# Patient Record
Sex: Male | Born: 1950 | Race: White | Hispanic: No | Marital: Married | Smoking: Former smoker
Health system: Southern US, Community
[De-identification: ages and names within clinical notes are randomized; demographics above are authoritative.]

## PROBLEM LIST (undated history)

## (undated) DIAGNOSIS — I219 Acute myocardial infarction, unspecified: Secondary | ICD-10-CM

## (undated) DIAGNOSIS — I2510 Atherosclerotic heart disease of native coronary artery without angina pectoris: Secondary | ICD-10-CM

## (undated) DIAGNOSIS — I25110 Atherosclerotic heart disease of native coronary artery with unstable angina pectoris: Secondary | ICD-10-CM

## (undated) DIAGNOSIS — E785 Hyperlipidemia, unspecified: Secondary | ICD-10-CM

## (undated) DIAGNOSIS — M1990 Unspecified osteoarthritis, unspecified site: Secondary | ICD-10-CM

## (undated) DIAGNOSIS — F1010 Alcohol abuse, uncomplicated: Secondary | ICD-10-CM

## (undated) DIAGNOSIS — R0602 Shortness of breath: Secondary | ICD-10-CM

## (undated) DIAGNOSIS — I10 Essential (primary) hypertension: Secondary | ICD-10-CM

## (undated) DIAGNOSIS — Z720 Tobacco use: Secondary | ICD-10-CM

## (undated) DIAGNOSIS — J42 Unspecified chronic bronchitis: Secondary | ICD-10-CM

## (undated) HISTORY — PX: CORONARY ANGIOPLASTY: SHX604

## (undated) HISTORY — DX: Acute myocardial infarction, unspecified: I21.9

## (undated) HISTORY — DX: Atherosclerotic heart disease of native coronary artery with unstable angina pectoris: I25.110

## (undated) HISTORY — PX: EYE SURGERY: SHX253

## (undated) HISTORY — PX: CARDIAC CATHETERIZATION: SHX172

---

## 2004-01-11 ENCOUNTER — Inpatient Hospital Stay (HOSPITAL_COMMUNITY): Admission: EM | Admit: 2004-01-11 | Discharge: 2004-01-14 | Payer: Self-pay

## 2004-01-11 HISTORY — PX: OTHER SURGICAL HISTORY: SHX169

## 2004-01-11 IMAGING — CR DG CHEST 1V PORT
1 series · 1 of 1 positions shown · non-contrast
Comparison: none

CLINICAL DATA: chest pain 
 PORTABLE CHEST SINGLE VIEW [DATE] [D9] hours:
 No prior studies for comparison. 
 Mild cardiomegaly.  No edema, infiltrate or visible pleural fluid.  Visualized bony thorax is unremarkable.  
 IMPRESSION
 Mild cardiomegaly.  No active disease.

[view not recorded]
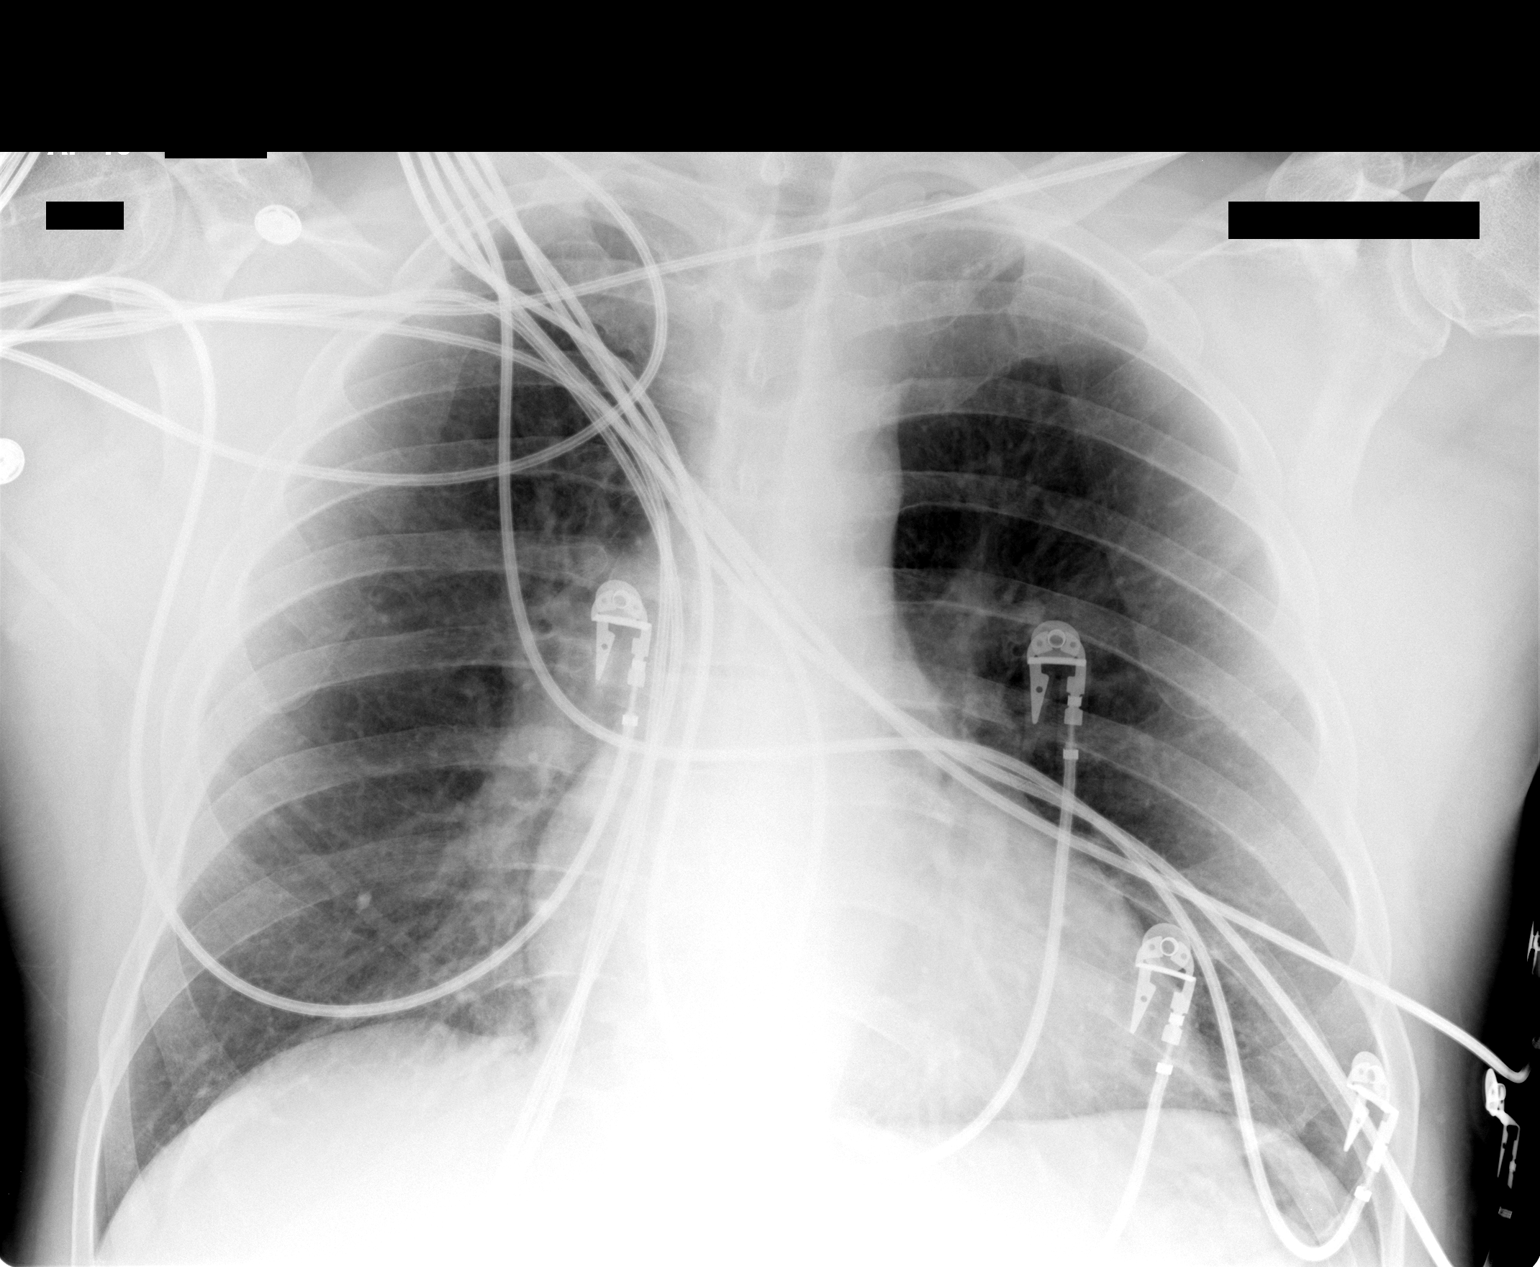

[1 of 1 positions shown; findings below may reference images not displayed]

## 2011-05-02 ENCOUNTER — Other Ambulatory Visit: Payer: Self-pay

## 2011-05-02 ENCOUNTER — Other Ambulatory Visit: Payer: Self-pay | Admitting: Family Medicine

## 2011-05-02 DIAGNOSIS — N5089 Other specified disorders of the male genital organs: Secondary | ICD-10-CM

## 2011-05-02 DIAGNOSIS — R1031 Right lower quadrant pain: Secondary | ICD-10-CM

## 2011-05-03 ENCOUNTER — Ambulatory Visit
Admission: RE | Admit: 2011-05-03 | Discharge: 2011-05-03 | Disposition: A | Discharge status: Home / Self Care | Payer: 59 | Source: Ambulatory Visit | Attending: Family Medicine | Admitting: Family Medicine

## 2011-05-03 ENCOUNTER — Ambulatory Visit: Admission: RE | Admit: 2011-05-03 | Payer: 59 | Source: Ambulatory Visit

## 2011-05-03 DIAGNOSIS — R1031 Right lower quadrant pain: Secondary | ICD-10-CM

## 2011-05-03 DIAGNOSIS — N5089 Other specified disorders of the male genital organs: Secondary | ICD-10-CM

## 2011-05-03 IMAGING — US US SCROTUM
1 series · 13 of 25 positions shown · non-contrast
Comparison: None.

CLINICAL DATA: Lump and scrotum, right lower quadrant pain

ULTRASOUND OF SCROTUM, LIMITED ULTRASOUND OF PELVIS
TECHNIQUE: Complete ultrasound examination of the testicles,
epididymis, and other scrotal structures was performed.

[Series 1: us scrotum · 0.12mm/px · 13 of 52 slices shown]
[im 1/52]
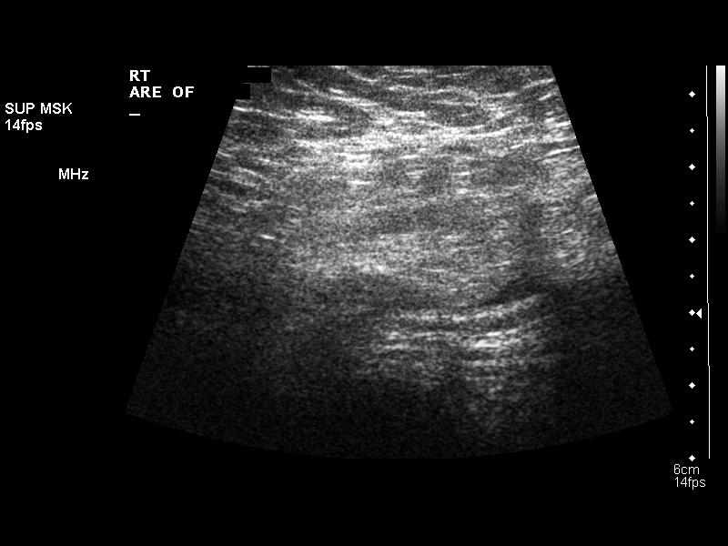
[im 5/52]
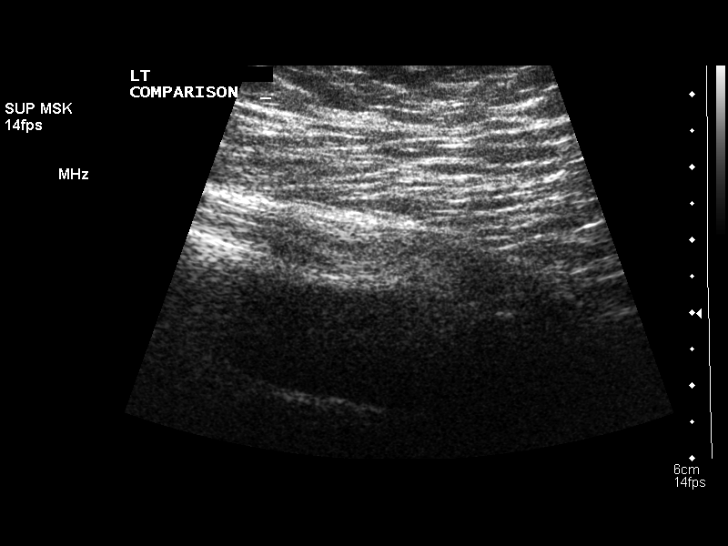
[im 9/52]
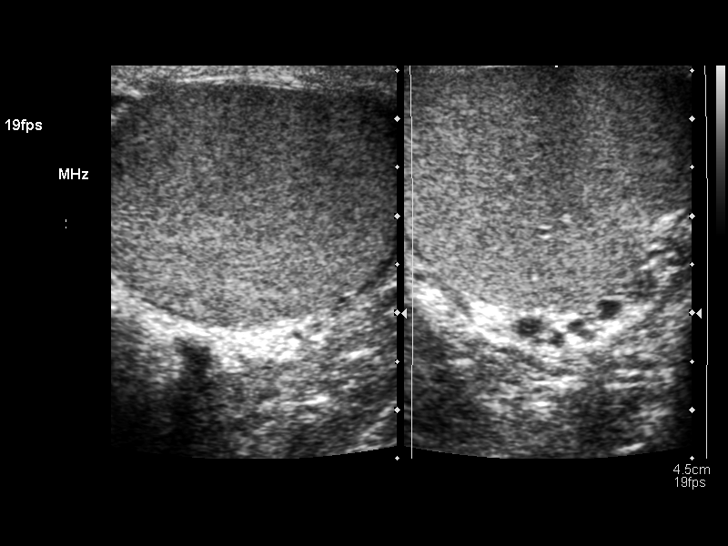
[im 13/52]
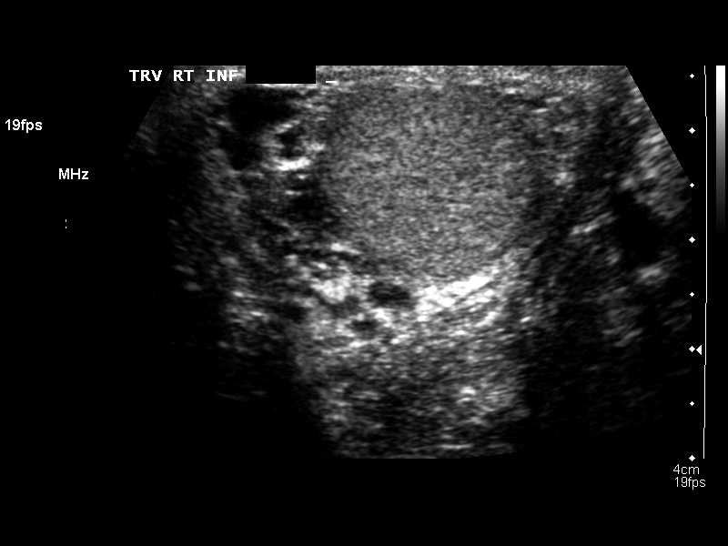
[im 18/52]
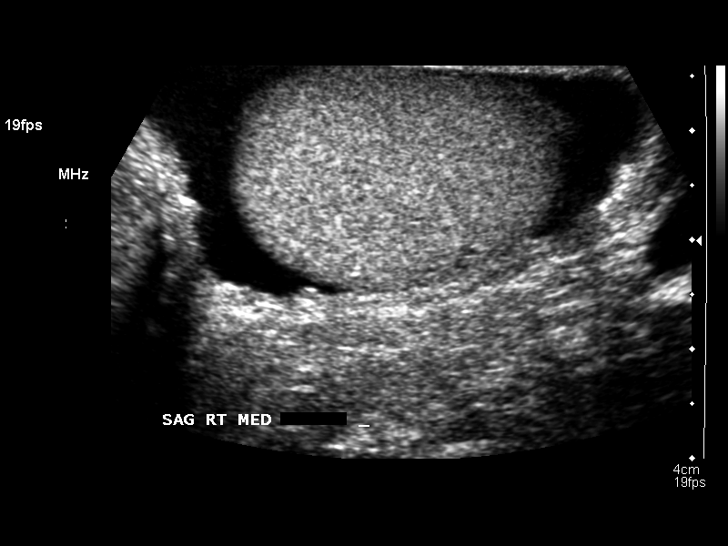
[im 22/52]
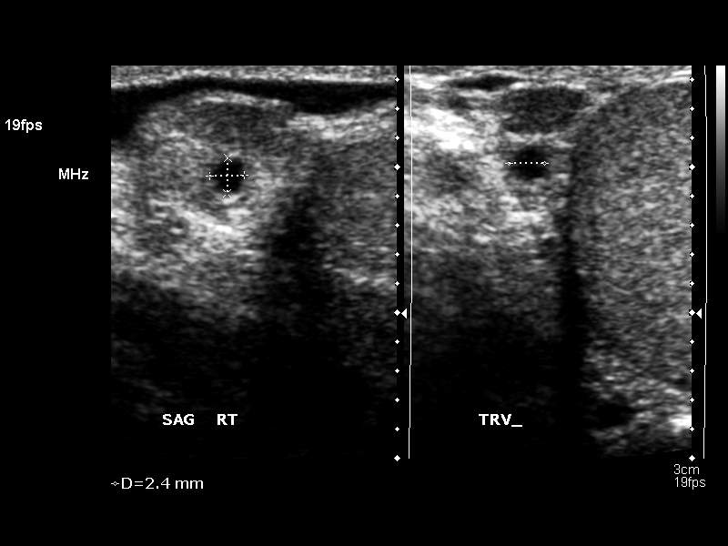
[im 26/52]
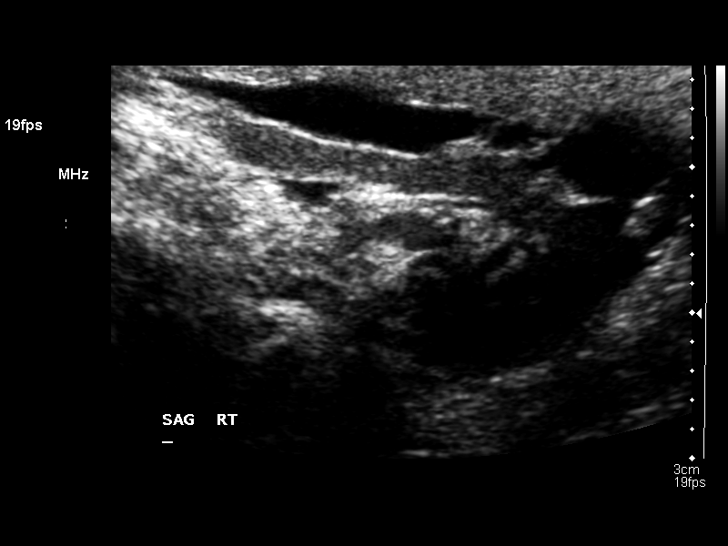
[im 30/52]
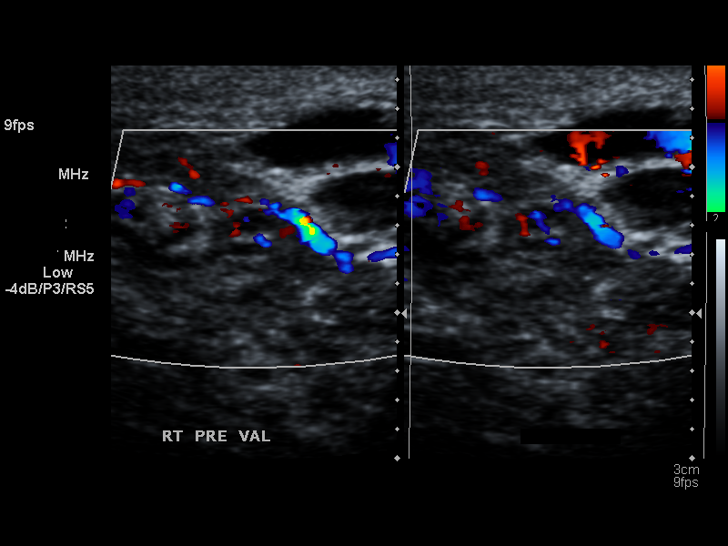
[im 35/52]
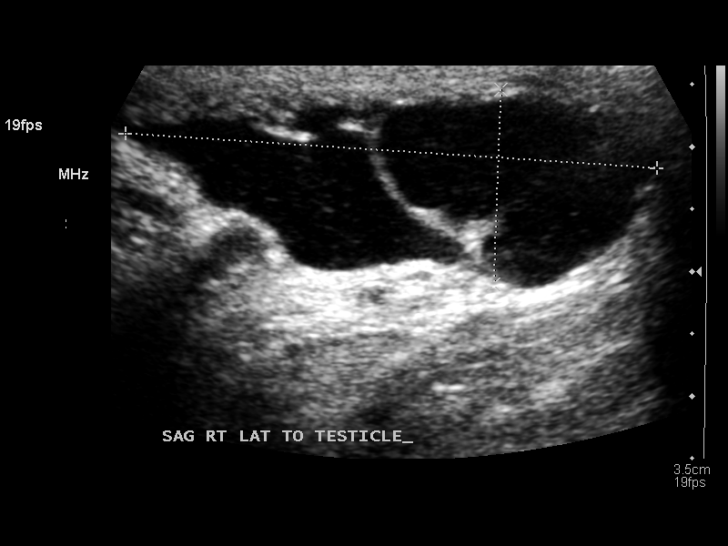
[im 39/52]
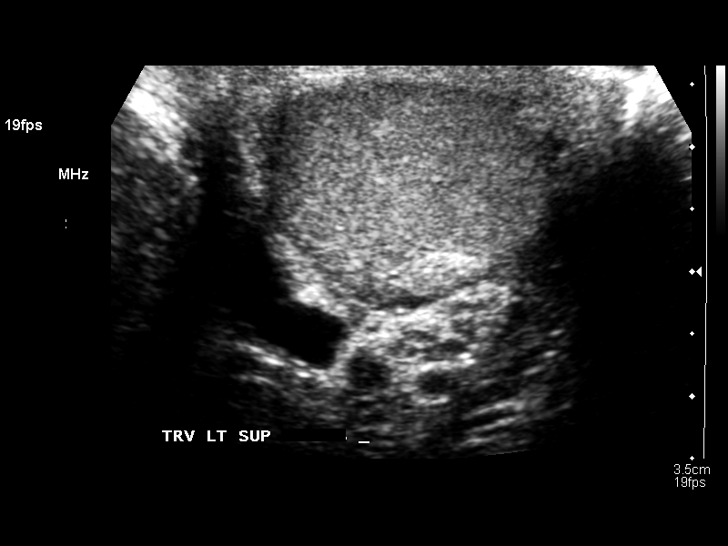
[im 43/52]
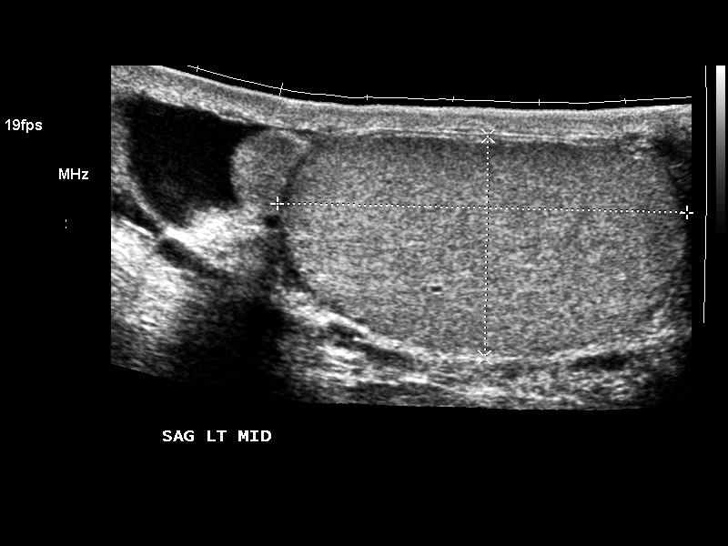
[im 47/52]
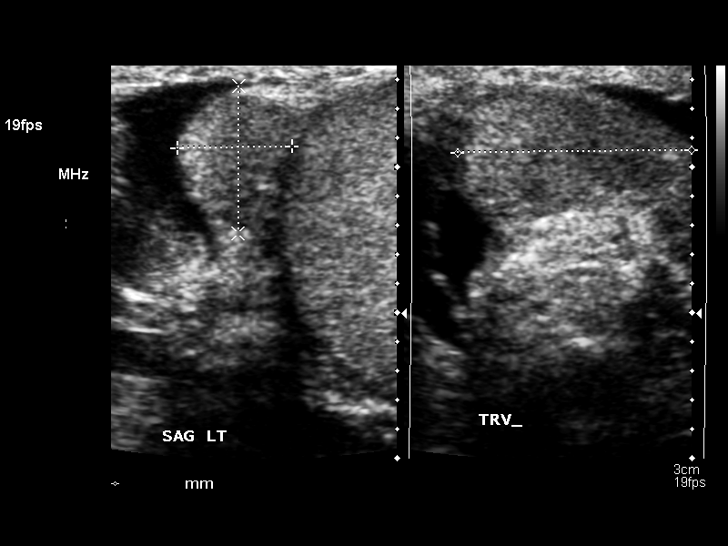
[im 52/52]
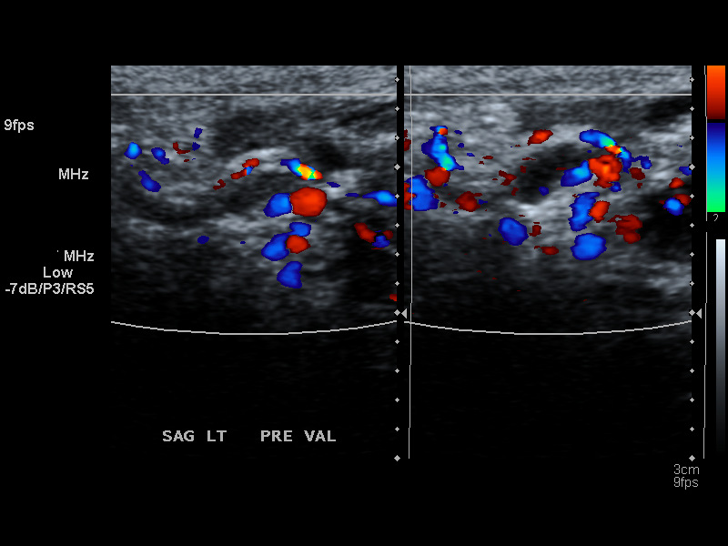

[13 of 25 positions shown; findings below may reference images not displayed]

FINDINGS: Right testis:  The right testicle is normal in size and
echogenicity.  There is blood flow to the right testicle. There is
a loculated complex fluid collection lateral to the right testicle
with internal debris.  This may represent a spermatocele of 4.3 x
1.6 x 1.8 cm.

Left testis:  The left testicle also is normal in size and in
echogenicity.  Blood flow is demonstrated to the left testicle.

Right epididymis:  A small cyst is noted in the head of the right
epididymis measuring 2 x 2 x 2 mm.

Left epididymis:  The left epididymis is unremarkable.

Hydrocele:  No definite hydrocele is seen.

Varicocele:  No varicocele is seen.
IMPRESSION: 1.  No intratesticular abnormality.  Blood flow is noted to both
testicles.
2.  Complex loculated fluid lateral to the right testicle may
represent spermatocele.

 LIMITED ULTRASOUND OF PELVIS
FINDINGS: Ultrasound over the right groin was performed.  There
does appear to be fat which extends somewhat further into the right
inguinal canal with Valsalva maneuver, consistent with a small
right inguinal hernia containing fat.
IMPRESSION: Suspect small right inguinal hernia containing fat.

## 2011-11-11 HISTORY — PX: CARDIOVASCULAR STRESS TEST: SHX262

## 2012-08-06 ENCOUNTER — Telehealth: Payer: Self-pay

## 2012-08-06 NOTE — Telephone Encounter (Signed)
Number given not valid, mailed notice to him.

## 2012-08-06 NOTE — Telephone Encounter (Signed)
Was here last for this on 04/2011, is it okay to proceed with referral? I think he needs eval first/ please advise.

## 2012-08-06 NOTE — Telephone Encounter (Signed)
Patient hasn't been here in 1 year.  Needs office visit for evaluation and decision about appropriate referral.

## 2012-08-06 NOTE — Telephone Encounter (Signed)
PT STATES HE DECLINED BEING REFERRED TO A SPECIALIST FOR A HERNIA, BUT NOW HE HAVE CHANGED HIS MIND PLEASE CALL [phone]

## 2012-09-23 ENCOUNTER — Ambulatory Visit (INDEPENDENT_AMBULATORY_CARE_PROVIDER_SITE_OTHER): Payer: 59 | Admitting: Emergency Medicine

## 2012-09-23 ENCOUNTER — Ambulatory Visit: Payer: 59

## 2012-09-23 VITALS — BP 107/62 | HR 55 | Temp 98.4°F | Resp 18 | Ht 67.25 in | Wt 204.0 lb

## 2012-09-23 DIAGNOSIS — K4090 Unilateral inguinal hernia, without obstruction or gangrene, not specified as recurrent: Secondary | ICD-10-CM

## 2012-09-23 DIAGNOSIS — J209 Acute bronchitis, unspecified: Secondary | ICD-10-CM

## 2012-09-23 DIAGNOSIS — R05 Cough: Secondary | ICD-10-CM

## 2012-09-23 DIAGNOSIS — I219 Acute myocardial infarction, unspecified: Secondary | ICD-10-CM | POA: Insufficient documentation

## 2012-09-23 DIAGNOSIS — I10 Essential (primary) hypertension: Secondary | ICD-10-CM

## 2012-09-23 DIAGNOSIS — N4340 Spermatocele of epididymis, unspecified: Secondary | ICD-10-CM

## 2012-09-23 DIAGNOSIS — J42 Unspecified chronic bronchitis: Secondary | ICD-10-CM

## 2012-09-23 DIAGNOSIS — R059 Cough, unspecified: Secondary | ICD-10-CM

## 2012-09-23 DIAGNOSIS — I214 Non-ST elevation (NSTEMI) myocardial infarction: Secondary | ICD-10-CM | POA: Insufficient documentation

## 2012-09-23 DIAGNOSIS — E785 Hyperlipidemia, unspecified: Secondary | ICD-10-CM | POA: Insufficient documentation

## 2012-09-23 HISTORY — DX: Non-ST elevation (NSTEMI) myocardial infarction: I21.4

## 2012-09-23 HISTORY — DX: Essential (primary) hypertension: I10

## 2012-09-23 HISTORY — DX: Unilateral inguinal hernia, without obstruction or gangrene, not specified as recurrent: K40.90

## 2012-09-23 IMAGING — CR DG CHEST 2V
2 series · 2 of 2 positions shown · non-contrast
Comparison: [HOSPITAL] portable chest radiograph
[DATE].

CLINICAL DATA: 61-year-old male with congestion and cough.

CHEST - 2 VIEW

[PA]
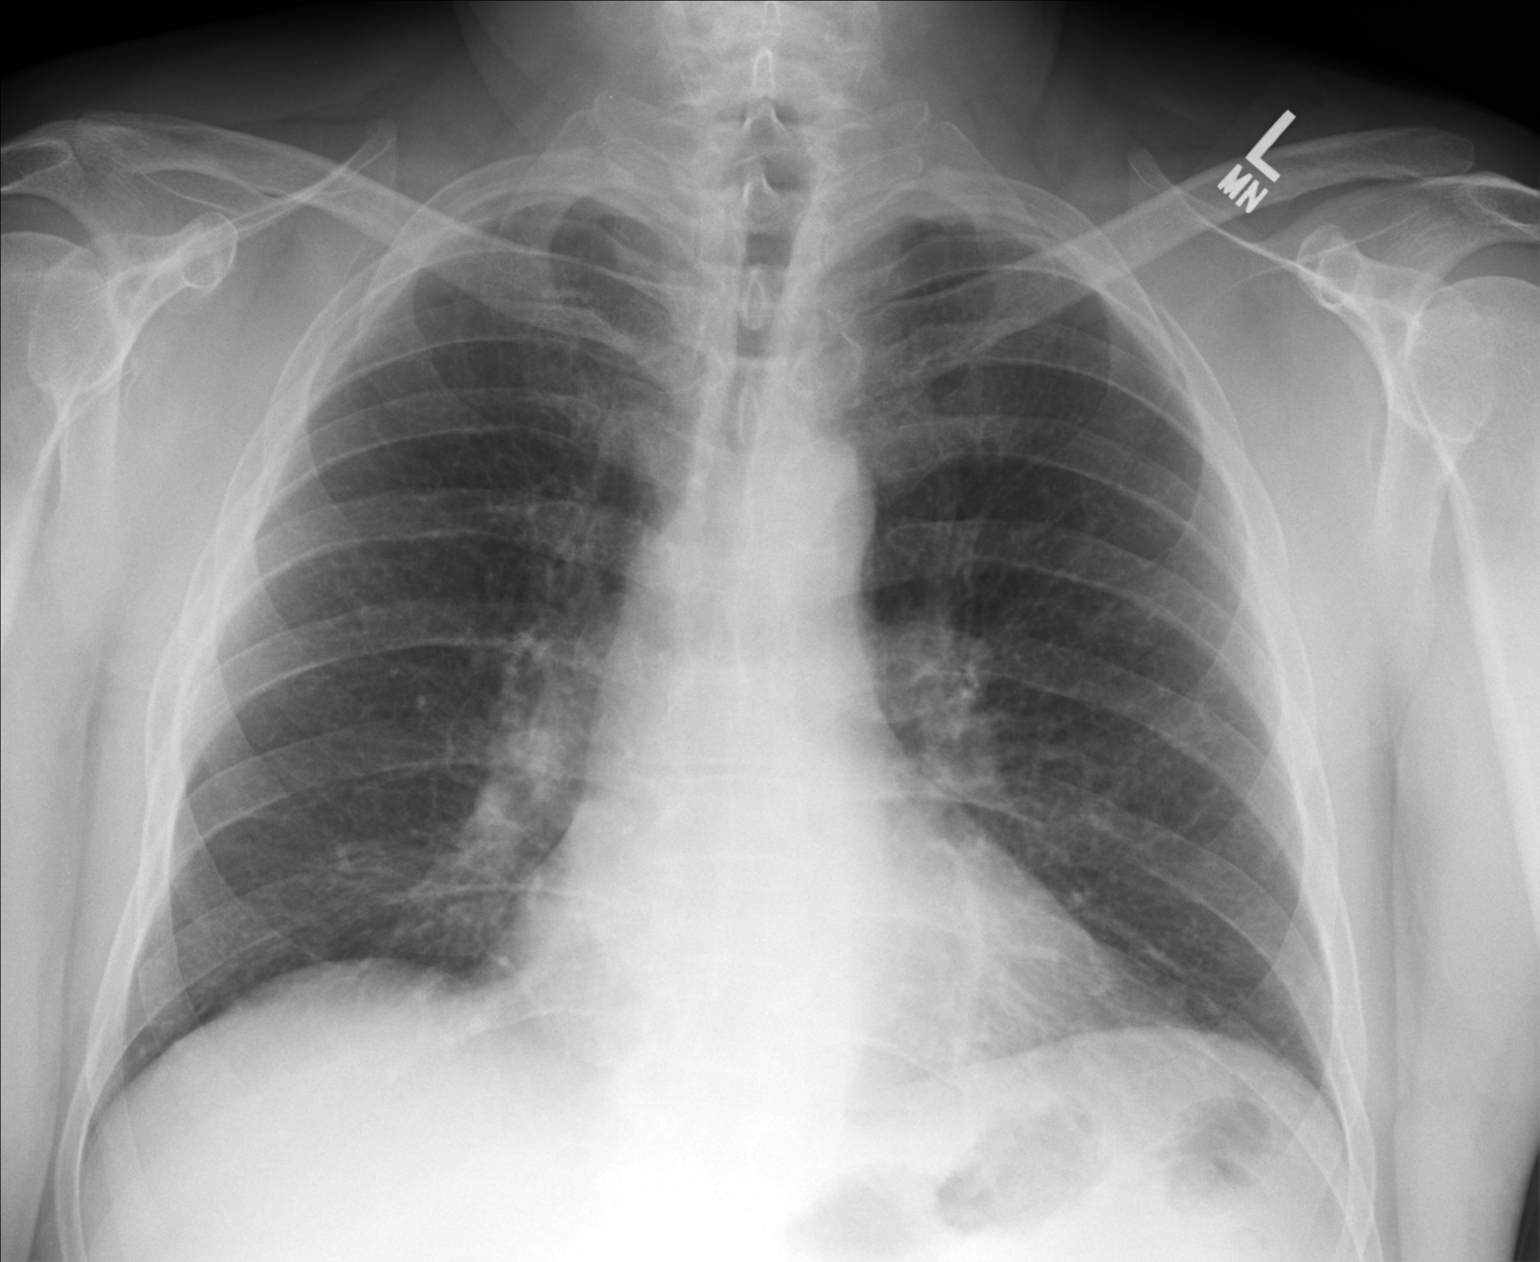

[lateral]
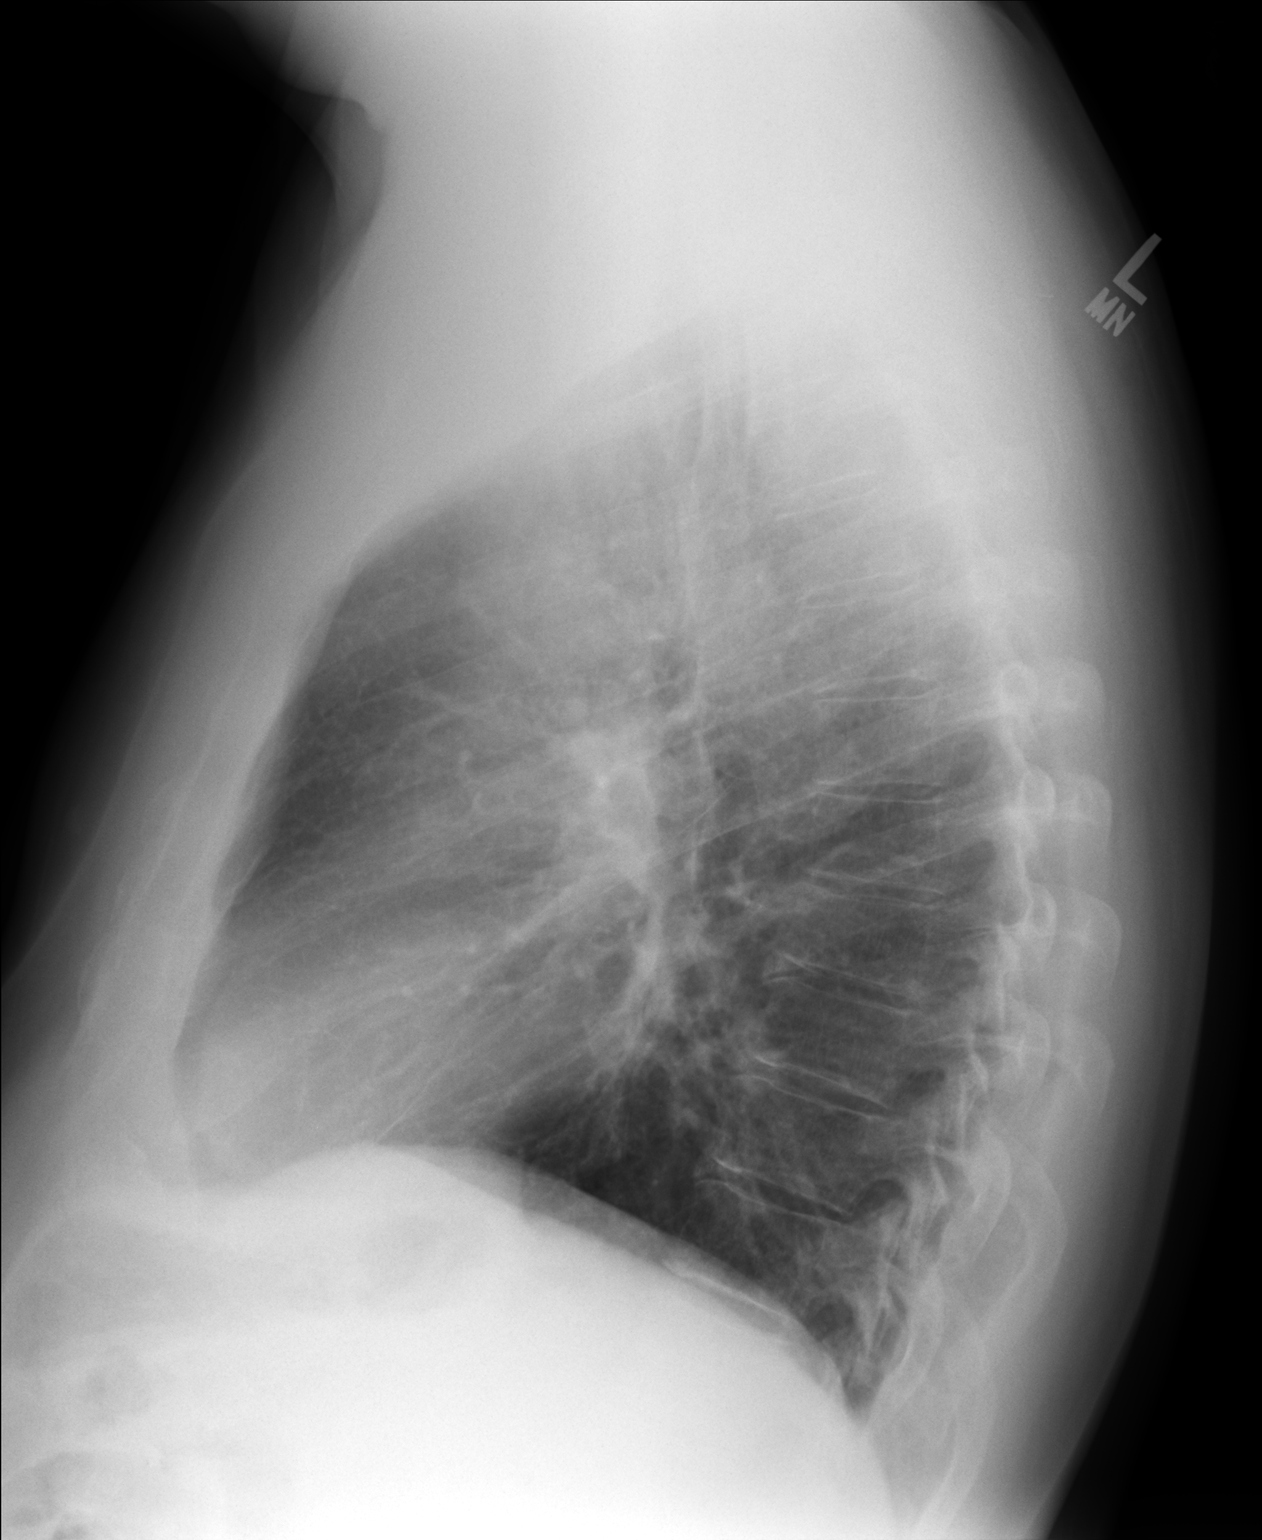

[2 of 2 positions shown; findings below may reference images not displayed]

FINDINGS: Stable lung volumes, within normal limits.  Cardiac size
and mediastinal contours are within normal limits.  The trachea
appears mildly diminutive on the anterior view, may be expanded in
the AP dimension on the lateral view.  No tracheal deviation.  No
pneumothorax, pulmonary edema, pleural effusion or consolidation.
Increased interstitial markings diffusely appear chronic. No acute
osseous abnormality identified.
IMPRESSION: No acute cardiopulmonary abnormality.

## 2012-09-23 MED ORDER — LEVOFLOXACIN 500 MG PO TABS: 500.0000 mg | ORAL_TABLET | Freq: Every day | ORAL | Status: AC

## 2012-09-23 MED ORDER — ALBUTEROL SULFATE HFA 108 (90 BASE) MCG/ACT IN AERS: 2.0000 | INHALATION_SPRAY | RESPIRATORY_TRACT | Status: DC | PRN

## 2012-09-23 NOTE — Progress Notes (Signed)
[facility] [address] [place] [address] [phone]  Date:  09/23/2012   Name:  Jonathan [name]   DOB:  [birth year]   MRN:  [id number]  PCP:  No primary provider on file.    Chief Complaint: Cough, URI, Sore Throat, Nasal Congestion and Hernia   History of Present Illness:  Jonathan [name] is a 62 y.o. very pleasant male patient who presents with the following:  Ill for one month with mucoid nasal drainage and post nasal drip.  Has a cough productive scant sputum. Some exertional shortness of breath and wheezing.  Continues to smoke 1PPD.  No fever or chills.  No nausea or vomiting.  No improvement with OTC medication.  Seems to linger longer than he is used to experiencing.  Patient Active Problem List  Diagnosis  . Essential hypertension, benign  . Heart attack  . Hyperlipidemia  . Spermatocele  . Hernia, inguinal    Past Medical History  Diagnosis Date  . Myocardial infarction     Past Surgical History  Procedure Date  . Coronary artery bypass graft     History  Substance Use Topics  . Smoking status: Current Every Day Smoker -- 1.0 packs/day for 30 years    Types: Cigarettes  . Smokeless tobacco: Never Used  . Alcohol Use: 2.5 oz/week    5 drink(s) per week     Comment: consumes 1 qt per week    No family history on file.  No Known Allergies  Medication list has been reviewed and updated.  Current Outpatient Prescriptions on File Prior to Visit  Medication Sig Dispense Refill  . colesevelam (WELCHOL) 625 MG tablet Take 1,875 mg by mouth 2 (two) times daily with a meal.      . ezetimibe (ZETIA) 10 MG tablet Take 10 mg by mouth at bedtime.      . fenofibrate micronized (LOFIBRA) 200 MG capsule Take 200 mg by mouth at bedtime.      . metoprolol succinate (TOPROL-XL) 25 MG 24 hr tablet Take 25 mg by mouth daily.      . ramipril (ALTACE) 10 MG capsule Take 10 mg by mouth daily.        Review of Systems:  As per HPI, otherwise  negative.    Physical Examination: Filed Vitals:   09/23/12 1151  BP: 107/62  Pulse: 55  Temp: 98.4 F (36.9 C)  Resp: 18   Filed Vitals:   09/23/12 1151  Height: 5' 7.25" (1.708 m)  Weight: 204 lb (92.534 kg)   Body mass index is 31.71 kg/(m^2). Ideal Body Weight: Weight in (lb) to have BMI = 25: 160.5   GEN: WDWN, NAD, Non-toxic, A & O x 3 HEENT: Atraumatic, Normocephalic. Neck supple. No masses, No LAD. Ears and Nose: No external deformity. CV: RRR, No M/G/R. No JVD. No thrill. No extra heart sounds. PULM: CTA B, diffuse wheezes, no crackles, rhonchi. No retractions. No resp. distress. No accessory muscle use. ABD: S, NT, ND, +BS. No rebound. No HSM. EXTR: No c/c/e NEURO Normal gait.  PSYCH: Normally interactive. Conversant. Not depressed or anxious appearing.  Calm demeanor.  GENITALIA:  Right spermatocele.  Right inguinal hernia.  Testes, scrotum, phallus normal left cord normal  Assessment and Plan: Right inguinal hernia Spermatocele Surgical referral Exacerbation chronic bronchitis MDI levaquin Follow up as needed  Jonathan Dane, MD  [facility] reading (PRIMARY) by  Dr. Dareen Piano.  No acute process.

## 2012-09-23 NOTE — Patient Instructions (Addendum)
Chronic Obstructive Pulmonary Disease Exacerbation Chronic obstructive pulmonary disease (COPD) is a condition that limits airflow. COPD may include chronic bronchitis, pulmonary emphysema, or both. COPD exacerbation means that your COPD has gotten worse. Without treatment, this can be a life-threatening problem. COPD exacerbation requires immediate medical care. CAUSES  COPD exacerbation can be caused by:  Exposure to smoke.  Exposure to air pollution, chemical fumes, or dust.  Respiratory infections.  Genetics, particularly alpha 1-antitrypsin deficiency.  A condition in which the body's immune system attacks itself (autoimmunity). SYMPTOMS   Increased coughing.  Increased wheezing.  Increased shortness of breath.  Swelling due to a buildup of fluid (peripheral edema) related to heart strain.  Rapid breathing.  Chest enlargement (barrel chest).  Chest tightness. DIAGNOSIS  There is no single test that can diagnosis COPD exacerbation. Your history, physical exam, and other tests will help your caregiver make a diagnosis. Tests may include a chest X-ray, pulmonary function tests, spirometry, basic lab tests, and an arterial blood gas test. TREATMENT  Severe problems may require a stay in the hospital. Depending on the cause of your problems, the following may be prescribed:  Antibiotic medicines.  Bronchodilators (inhaled or tablets).  Cortisone medicines (inhaled or tablets).  Supplemental oxygen therapy.  Pulmonary rehabilitation. This is a broad program that may involve exercise, nutrition counseling, breathing techniques, and further education about your condition. It is important to use good technique with inhaled medicines. Spacer devices may be needed to help improve drug delivery. HOME CARE INSTRUCTIONS   Do not smoke. Quitting smoking is very important to prevent worsening of COPD.  Avoid exposure to all substances that irritate the airway, especially tobacco  smoke.  If prescribed, take your antibiotics as directed. Finish them even if you start to feel better.  Only take over-the-counter or prescription medicines as directed by your caregiver.  Drink enough fluids to keep your urine clear or pale yellow. This can help thin bronchial secretions.  Use a cool mist vaporizer. This makes it easier to clear your chest when you cough.  If you have a home nebulizer and oxygen, continue to use them as directed.  Maintain all necessary vaccinations to prevent infections.  Exercise regularly.  Eat a healthy diet.  Keep all follow-up appointments as directed by your caregiver. SEEK IMMEDIATE MEDICAL CARE IF:  You have extreme shortness of breath.  You have trouble talking.  You have severe chest pain or blood in your sputum.  You have a high fever, weakness, repeated vomiting, or fainting.  You feel confused.  You keep getting worse. MAKE SURE YOU:   Understand these instructions.  Will watch your condition.  Will get help right away if you are not doing well or get worse. Document Released: 06/26/2007 Document Revised: 11/21/2011 Document Reviewed: 04/26/2011 [facility] Patient Information 2013 [place], [place]. Inguinal Hernia, Adult Muscles help keep everything in the body in its proper place. But if a weak spot in the muscles develops, something can poke through. That is called a hernia. When this happens in the lower part of the belly (abdomen), it is called an inguinal hernia. (It takes its name from a part of the body in this region called the inguinal canal.) A weak spot in the wall of muscles lets some fat or part of the small intestine bulge through. An inguinal hernia can develop at any age. Men get them more often than women. CAUSES  In adults, an inguinal hernia develops over time.  It can be triggered by:  Suddenly straining the muscles of the lower abdomen.  Lifting heavy objects.  Straining to have a bowel movement.  Difficult bowel movements (constipation) can lead to this.  Constant coughing. This may be caused by smoking or lung disease.  Being overweight.  Being pregnant.  Working at a job that requires long periods of standing or heavy lifting.  Having had an inguinal hernia before. One type can be an emergency situation. It is called a strangulated inguinal hernia. It develops if part of the small intestine slips through the weak spot and cannot get back into the abdomen. The blood supply can be cut off. If that happens, part of the intestine may die. This situation requires emergency surgery. SYMPTOMS  Often, a small inguinal hernia has no symptoms. It is found when a healthcare provider does a physical exam. Larger hernias usually have symptoms.   In adults, symptoms may include:  A lump in the groin. This is easier to see when the person is standing. It might disappear when lying down.  In men, a lump in the scrotum.  Pain or burning in the groin. This occurs especially when lifting, straining or coughing.  A dull ache or feeling of pressure in the groin.  Signs of a strangulated hernia can include:  A bulge in the groin that becomes very painful and tender to the touch.  A bulge that turns red or purple.  Fever, nausea and vomiting.  Inability to have a bowel movement or to pass gas. DIAGNOSIS  To decide if you have an inguinal hernia, a healthcare provider will probably do a physical examination.  This will include asking questions about any symptoms you have noticed.  The healthcare provider might feel the groin area and ask you to cough. If an inguinal hernia is felt, the healthcare provider may try to slide it back into the abdomen.  Usually no other tests are needed. TREATMENT  Treatments can vary. The size of the hernia makes a difference. Options include:  Watchful waiting. This is often suggested if the hernia is small and you have had no symptoms.  No medical  procedure will be done unless symptoms develop.  You will need to watch closely for symptoms. If any occur, contact your healthcare provider right away.  Surgery. This is used if the hernia is larger or you have symptoms.  Open surgery. This is usually an outpatient procedure (you will not stay overnight in a hospital). An cut (incision) is made through the skin in the groin. The hernia is put back inside the abdomen. The weak area in the muscles is then repaired by herniorrhaphy or hernioplasty. Herniorrhaphy: in this type of surgery, the weak muscles are sewn back together. Hernioplasty: a patch or mesh is used to close the weak area in the abdominal wall.  Laparoscopy. In this procedure, a surgeon makes small incisions. A thin tube with a tiny video camera (called a laparoscope) is put into the abdomen. The surgeon repairs the hernia with mesh by looking with the video camera and using two long instruments. HOME CARE INSTRUCTIONS   After surgery to repair an inguinal hernia:  You will need to take pain medicine prescribed by your healthcare provider. Follow all directions carefully.  You will need to take care of the wound from the incision.  Your activity will be restricted for awhile. This will probably include no heavy lifting for several weeks. You also should not do anything too active for a few weeks. When you  can return to work will depend on the type of job that you have.  During "watchful waiting" periods, you should:  Maintain a healthy weight.  Eat a diet high in fiber (fruits, vegetables and whole grains).  Drink plenty of fluids to avoid constipation. This means drinking enough water and other liquids to keep your urine clear or pale yellow.  Do not lift heavy objects.  Do not stand for long periods of time.  Quit smoking. This should keep you from developing a frequent cough. SEEK MEDICAL CARE IF:   A bulge develops in your groin area.  You feel pain, a burning  sensation or pressure in the groin. This might be worse if you are lifting or straining.  You develop a fever of more than 100.5 F (38.1 C). SEEK IMMEDIATE MEDICAL CARE IF:   Pain in the groin increases suddenly.  A bulge in the groin gets bigger suddenly and does not go down.  For men, there is sudden pain in the scrotum. Or, the size of the scrotum increases.  A bulge in the groin area becomes red or purple and is painful to touch.  You have nausea or vomiting that does not go away.  You feel your heart beating much faster than normal.  You cannot have a bowel movement or pass gas.  You develop a fever of more than 102.0 F (38.9 C). Document Released: 01/15/2009 Document Revised: 11/21/2011 Document Reviewed: 01/15/2009 [facility] Patient Information 2013 [place], [place].

## 2012-09-24 NOTE — Progress Notes (Signed)
Reviewed and agree.

## 2012-10-03 ENCOUNTER — Ambulatory Visit (INDEPENDENT_AMBULATORY_CARE_PROVIDER_SITE_OTHER): Payer: Self-pay | Admitting: General Surgery

## 2012-10-05 ENCOUNTER — Encounter (INDEPENDENT_AMBULATORY_CARE_PROVIDER_SITE_OTHER): Payer: Self-pay | Admitting: General Surgery

## 2012-10-05 ENCOUNTER — Ambulatory Visit (INDEPENDENT_AMBULATORY_CARE_PROVIDER_SITE_OTHER): Payer: 59 | Admitting: General Surgery

## 2012-10-05 VITALS — BP 124/70 | HR 57 | Temp 97.8°F | Ht 67.0 in | Wt 207.2 lb

## 2012-10-05 DIAGNOSIS — K4090 Unilateral inguinal hernia, without obstruction or gangrene, not specified as recurrent: Secondary | ICD-10-CM

## 2012-10-05 NOTE — Progress Notes (Signed)
Patient ID: Jonathan [name], male   DOB: 1951/09/05, 62 y.o.   MRN: [id number]  Chief Complaint  Patient presents with  . New Evaluation    eval RIH    HPI Jonathan [name] is a 62 y.o. male.  This patient is referred by Dr. Dareen Piano for evaluation of a right inguinal hernia. He says that he has noticed this for over a year when he first noticed a bulge and some discomfort in the area. He had an ultrasound at that time as well which also demonstrated a spermatocele both it wasn't bothering him that much and did not feel that it was important to repair at that time. He says that over the years he still has only mild discomfort in the area and does not seem to limit his activities but he does do a lot of lifting at work and he says that the bulge has been increasing in size. He denies any nausea or vomiting or abdominal distention or obstructive symptoms. HPI  Past Medical History  Diagnosis Date  . Myocardial infarction   . Heart attack     Past Surgical History  Procedure Date  . Coronary artery bypass graft   . Heart stents 01/2004    History reviewed. No pertinent family history.  Social History History  Substance Use Topics  . Smoking status: Current Every Day Smoker -- 1.0 packs/day for 30 years    Types: Cigarettes  . Smokeless tobacco: Never Used  . Alcohol Use: 2.5 oz/week    5 drink(s) per week     Comment: consumes 1 pint per week    No Known Allergies  Current Outpatient Prescriptions  Medication Sig Dispense Refill  . albuterol (PROVENTIL HFA;VENTOLIN HFA) 108 (90 BASE) MCG/ACT inhaler Inhale 2 puffs into the lungs every 4 (four) hours as needed for wheezing (cough, shortness of breath or wheezing.).  1 Inhaler  1  . aspirin 81 MG tablet Take 81 mg by mouth daily.      . clopidogrel (PLAVIX) 75 MG tablet Take 75 mg by mouth daily.      . colesevelam (WELCHOL) 625 MG tablet Take 1,875 mg by mouth 2 (two) times daily with a meal.      . ezetimibe (ZETIA) 10 MG tablet  Take 10 mg by mouth at bedtime.      . fenofibrate micronized (LOFIBRA) 200 MG capsule Take 200 mg by mouth at bedtime.      . metoprolol succinate (TOPROL-XL) 25 MG 24 hr tablet Take 25 mg by mouth daily.      . ramipril (ALTACE) 10 MG capsule Take 10 mg by mouth daily.        Review of Systems Review of Systems All other review of systems negative or noncontributory except as stated in the HPI  Blood pressure 124/70, pulse 57, temperature 97.8 F (36.6 C), temperature source Temporal, height 5\' 7"  (1.702 m), weight 207 lb 3.2 oz (93.985 kg), SpO2 97.00%.  Physical Exam Physical Exam Physical Exam  Vitals reviewed. Constitutional: He is oriented to person, place, and time. He appears well-developed and well-nourished. No distress.  HENT:  Head: Normocephalic and atraumatic.  Mouth/Throat: No oropharyngeal exudate.  Eyes: Conjunctivae and EOM are normal. Pupils are equal, round, and reactive to light. Right eye exhibits no discharge. Left eye exhibits no discharge. No scleral icterus.  Neck: Normal range of motion. No tracheal deviation present.  Cardiovascular: Normal rate, regular rhythm and normal heart sounds.   Pulmonary/Chest: Effort normal  and breath sounds normal. No stridor. No respiratory distress. He has slight wheezes. He has no rales. He exhibits no tenderness.  Abdominal: Soft. Bowel sounds are normal. He exhibits no distension and no mass. There is no tenderness. There is no rebound and no guarding.  He does have a moderate size reducible right inguinal hernia without any left inguinal hernia. Musculoskeletal: Normal range of motion. He exhibits no edema and no tenderness.  Neurological: He is alert and oriented to person, place, and time.  Skin: Skin is warm and dry. No rash noted. He is not diaphoretic. No erythema. No pallor.  Psychiatric: He has a normal mood and affect. His behavior is normal. Judgment and thought content normal.    Data Reviewed   Assessment     Reducible right inguinal hernia He does have a reducible right inguinal hernia which is mildly symptomatic and enlarging over time. I discussed with him the options for continued watchful waiting versus open or laparoscopic repair.  He would like to have surgical repair and I think open repair would be fine for him. I. Discussed with him the pros and cons of and the risks of the procedure. We discussed the risks of infection, bleeding, pain, scarring, recurrence, nerve injury and chronic pain, injury to the vas deferens and testicle, and cardiac complications and he expressed understanding and would like to proceed with open repair of right inguinal hernia    Plan    Will plan for open RIH repair after cardiac clearance by Dr. Tresa Endo.       Lodema Pilot DAVID 10/05/2012, 10:32 AM

## 2012-10-17 ENCOUNTER — Telehealth (INDEPENDENT_AMBULATORY_CARE_PROVIDER_SITE_OTHER): Payer: Self-pay

## 2012-10-17 NOTE — Telephone Encounter (Signed)
Left message for patient to call our office   Please advise patient that he has been cleared for surgery, per Dr. Tresa Endo patient need's to hold aspirin 5-7 day's prior to surgery, patient need's to hold plavix 5 day's prior to surgery.  Surgery schedulers will be contacting Jonathan [name] to schedule his Hernia Repair.

## 2012-10-18 NOTE — Telephone Encounter (Signed)
Patient called back and I made him aware he has been cleared for surgery and orders taken to our scheduling department to contact him with a date. He is aware to stop ASA 7 days prior to surgery and stop Plavix 5 days prior to surgery. He has some insurance questions that I encouraged him to discuss with the scheduling department. He will call with any other questions.

## 2012-10-19 ENCOUNTER — Encounter (INDEPENDENT_AMBULATORY_CARE_PROVIDER_SITE_OTHER): Payer: Self-pay

## 2012-11-22 NOTE — Progress Notes (Signed)
Dr Biagio Quint,  Need Pre OP ORDERS PLEASE for PST APPT  Thanks

## 2012-11-23 ENCOUNTER — Encounter (HOSPITAL_COMMUNITY): Payer: Self-pay | Admitting: Pharmacy Technician

## 2012-11-26 NOTE — Progress Notes (Signed)
We need orders put in EPIC please--pt coming for preop 11/28/12 - thank you

## 2012-11-27 NOTE — Patient Instructions (Signed)
[name]  11/27/2012   Your procedure is scheduled on: 12/06/12    Report to [facility] at   0930  AM.  Call this number if you have problems the morning of surgery: [phone]   Remember:   Do not eat food or drink liquids after midnight.   Take these medicines the morning of surgery with A SIP OF WATER:    Do not wear jewelry,   Do not wear lotions, powders, or perfumes.   . Men may shave face and neck.  Do not bring valuables to the hospital.  Contacts, dentures or bridgework may not be worn into surgery.      Patients discharged the day of surgery will not be allowed to drive  home.  Name and phone number of your driver:    SEE CHG INSTRUCTION SHEET    Please read over the following fact sheets that you were given: MRSA Information, coughing and deep breathing exercises, leg exercises               Failure to comply with these instructions may result in cancellation of your surgery.                Patient Signature ____________________________              Nurse Signature _____________________________

## 2012-11-28 ENCOUNTER — Encounter (HOSPITAL_COMMUNITY)
Admission: RE | Admit: 2012-11-28 | Discharge: 2012-11-28 | Disposition: A | Discharge status: Home / Self Care | Payer: 59 | Source: Ambulatory Visit | Attending: General Surgery | Admitting: General Surgery

## 2012-11-28 ENCOUNTER — Encounter (HOSPITAL_COMMUNITY): Payer: Self-pay

## 2012-11-28 HISTORY — DX: Shortness of breath: R06.02

## 2012-11-28 HISTORY — DX: Essential (primary) hypertension: I10

## 2012-11-28 HISTORY — DX: Unspecified osteoarthritis, unspecified site: M19.90

## 2012-11-28 HISTORY — DX: Unspecified chronic bronchitis: J42

## 2012-11-28 LAB — CBC
MCH: 31.2 pg (ref 26.0–34.0)
MCHC: 33.5 g/dL (ref 30.0–36.0)
Platelets: 241 10*3/uL (ref 150–400)

## 2012-11-28 LAB — BASIC METABOLIC PANEL
Calcium: 9.5 mg/dL (ref 8.4–10.5)
Creatinine, Ser: 1.02 mg/dL (ref 0.50–1.35)
GFR calc Af Amer: 88 mL/min — ABNORMAL LOW (ref >90)
Sodium: 136 mEq/L (ref 135–145)

## 2012-11-28 NOTE — Progress Notes (Signed)
Your patient has screened at an elevated risk for Obstructive Sleep Apnea using the STOP-Bang tool during a presurgical visit.  A score of 4 or greater is considered an elevated risk.

## 2012-11-28 NOTE — Progress Notes (Signed)
07/25/12 Last office visit note with Dr Bishop Limbo on chart  11/13 EKG on chart  11/11/11 Stress Test on chart  Clearance in EPIC 10/18/12 Dr Bishop Limbo

## 2012-12-06 ENCOUNTER — Encounter (HOSPITAL_COMMUNITY)
Admission: RE | Disposition: A | Discharge status: Home / Self Care | Payer: Self-pay | Source: Ambulatory Visit | Attending: General Surgery

## 2012-12-06 ENCOUNTER — Ambulatory Visit (HOSPITAL_COMMUNITY)
Admission: RE | Admit: 2012-12-06 | Discharge: 2012-12-06 | Disposition: A | Discharge status: Home / Self Care | Payer: 59 | Source: Ambulatory Visit | Attending: General Surgery | Admitting: General Surgery

## 2012-12-06 ENCOUNTER — Encounter (HOSPITAL_COMMUNITY): Payer: Self-pay | Admitting: *Deleted

## 2012-12-06 ENCOUNTER — Encounter (HOSPITAL_COMMUNITY): Payer: Self-pay | Admitting: Anesthesiology

## 2012-12-06 ENCOUNTER — Ambulatory Visit (HOSPITAL_COMMUNITY): Payer: 59 | Admitting: Anesthesiology

## 2012-12-06 DIAGNOSIS — D176 Benign lipomatous neoplasm of spermatic cord: Secondary | ICD-10-CM | POA: Insufficient documentation

## 2012-12-06 DIAGNOSIS — E669 Obesity, unspecified: Secondary | ICD-10-CM | POA: Insufficient documentation

## 2012-12-06 DIAGNOSIS — Z7982 Long term (current) use of aspirin: Secondary | ICD-10-CM | POA: Insufficient documentation

## 2012-12-06 DIAGNOSIS — Z7902 Long term (current) use of antithrombotics/antiplatelets: Secondary | ICD-10-CM | POA: Insufficient documentation

## 2012-12-06 DIAGNOSIS — I2510 Atherosclerotic heart disease of native coronary artery without angina pectoris: Secondary | ICD-10-CM | POA: Insufficient documentation

## 2012-12-06 DIAGNOSIS — K4090 Unilateral inguinal hernia, without obstruction or gangrene, not specified as recurrent: Secondary | ICD-10-CM | POA: Insufficient documentation

## 2012-12-06 DIAGNOSIS — Z79899 Other long term (current) drug therapy: Secondary | ICD-10-CM | POA: Insufficient documentation

## 2012-12-06 DIAGNOSIS — F17200 Nicotine dependence, unspecified, uncomplicated: Secondary | ICD-10-CM | POA: Insufficient documentation

## 2012-12-06 DIAGNOSIS — Z9861 Coronary angioplasty status: Secondary | ICD-10-CM | POA: Insufficient documentation

## 2012-12-06 DIAGNOSIS — I252 Old myocardial infarction: Secondary | ICD-10-CM | POA: Insufficient documentation

## 2012-12-06 HISTORY — PX: INSERTION OF MESH: SHX5868

## 2012-12-06 HISTORY — PX: INGUINAL HERNIA REPAIR: SHX194

## 2012-12-06 SURGERY — REPAIR, HERNIA, INGUINAL, ADULT
Anesthesia: General | Laterality: Right | Wound class: Clean

## 2012-12-06 MED ORDER — LACTATED RINGERS IV SOLN
INTRAVENOUS | Status: DC
  Administered 2012-12-06 (×2): via INTRAVENOUS

## 2012-12-06 MED ORDER — HYDROMORPHONE HCL PF 1 MG/ML IJ SOLN
0.2500 mg | INTRAMUSCULAR | Status: DC | PRN
  Administered 2012-12-06: 0.5 mg via INTRAVENOUS

## 2012-12-06 MED ORDER — BUPIVACAINE HCL (PF) 0.25 % IJ SOLN
INTRAMUSCULAR | Status: DC | PRN
  Administered 2012-12-06: 15 mL

## 2012-12-06 MED ORDER — METOPROLOL SUCCINATE ER 25 MG PO TB24
25.0000 mg | ORAL_TABLET | Freq: Once | ORAL | Status: DC
  Filled 2012-12-06: qty 1

## 2012-12-06 MED ORDER — EPHEDRINE SULFATE 50 MG/ML IJ SOLN
INTRAMUSCULAR | Status: DC | PRN
  Administered 2012-12-06: 5 mg via INTRAVENOUS

## 2012-12-06 MED ORDER — 0.9 % SODIUM CHLORIDE (POUR BTL) OPTIME
TOPICAL | Status: DC | PRN
  Administered 2012-12-06: 1000 mL

## 2012-12-06 MED ORDER — FENTANYL CITRATE 0.05 MG/ML IJ SOLN
INTRAMUSCULAR | Status: DC | PRN
  Administered 2012-12-06 (×2): 50 ug via INTRAVENOUS
  Administered 2012-12-06 (×4): 25 ug via INTRAVENOUS

## 2012-12-06 MED ORDER — CEFAZOLIN SODIUM-DEXTROSE 2-3 GM-% IV SOLR
INTRAVENOUS | Status: AC
  Filled 2012-12-06: qty 50

## 2012-12-06 MED ORDER — BUPIVACAINE HCL (PF) 0.25 % IJ SOLN
INTRAMUSCULAR | Status: AC
  Filled 2012-12-06: qty 30

## 2012-12-06 MED ORDER — LIDOCAINE-EPINEPHRINE 1 %-1:100000 IJ SOLN
INTRAMUSCULAR | Status: DC | PRN
  Administered 2012-12-06: 15 mL

## 2012-12-06 MED ORDER — ACETAMINOPHEN 10 MG/ML IV SOLN
INTRAVENOUS | Status: DC | PRN
  Administered 2012-12-06: 1000 mg via INTRAVENOUS

## 2012-12-06 MED ORDER — LIDOCAINE HCL (CARDIAC) 20 MG/ML IV SOLN
INTRAVENOUS | Status: DC | PRN
  Administered 2012-12-06: 50 mg via INTRAVENOUS

## 2012-12-06 MED ORDER — ACETAMINOPHEN 10 MG/ML IV SOLN
INTRAVENOUS | Status: AC
  Filled 2012-12-06: qty 100

## 2012-12-06 MED ORDER — MEPERIDINE HCL 50 MG/ML IJ SOLN: 6.2500 mg | INTRAMUSCULAR | Status: DC | PRN

## 2012-12-06 MED ORDER — MIDAZOLAM HCL 5 MG/5ML IJ SOLN
INTRAMUSCULAR | Status: DC | PRN
  Administered 2012-12-06: 2 mg via INTRAVENOUS

## 2012-12-06 MED ORDER — ACETAMINOPHEN 10 MG/ML IV SOLN
1000.0000 mg | Freq: Once | INTRAVENOUS | Status: AC | PRN
  Administered 2012-12-06: 1000 mg via INTRAVENOUS
  Filled 2012-12-06: qty 100

## 2012-12-06 MED ORDER — ONDANSETRON HCL 4 MG/2ML IJ SOLN
INTRAMUSCULAR | Status: DC | PRN
  Administered 2012-12-06: 4 mg via INTRAVENOUS

## 2012-12-06 MED ORDER — PROPOFOL 10 MG/ML IV BOLUS
INTRAVENOUS | Status: DC | PRN
  Administered 2012-12-06: 200 mg via INTRAVENOUS

## 2012-12-06 MED ORDER — PROMETHAZINE HCL 25 MG/ML IJ SOLN: 6.2500 mg | INTRAMUSCULAR | Status: DC | PRN

## 2012-12-06 MED ORDER — LIDOCAINE-EPINEPHRINE 1 %-1:100000 IJ SOLN
INTRAMUSCULAR | Status: AC
  Filled 2012-12-06: qty 1

## 2012-12-06 MED ORDER — CEFAZOLIN SODIUM-DEXTROSE 2-3 GM-% IV SOLR
2.0000 g | Freq: Once | INTRAVENOUS | Status: AC
  Administered 2012-12-06: 2 g via INTRAVENOUS

## 2012-12-06 MED ORDER — OXYCODONE HCL 5 MG PO TABS
5.0000 mg | ORAL_TABLET | Freq: Once | ORAL | Status: DC | PRN
Start: 2012-12-06 — End: 2012-12-06

## 2012-12-06 MED ORDER — OXYCODONE HCL 5 MG PO TABS: 5.0000 mg | ORAL_TABLET | ORAL | Status: DC | PRN

## 2012-12-06 MED ORDER — ATROPINE SULFATE 0.4 MG/ML IJ SOLN
INTRAMUSCULAR | Status: DC | PRN
  Administered 2012-12-06: 0.4 mg via INTRAVENOUS

## 2012-12-06 MED ORDER — OXYCODONE HCL 5 MG/5ML PO SOLN
5.0000 mg | Freq: Once | ORAL | Status: DC | PRN
Start: 2012-12-06 — End: 2012-12-06

## 2012-12-06 MED ORDER — HYDROMORPHONE HCL PF 1 MG/ML IJ SOLN
INTRAMUSCULAR | Status: AC
  Filled 2012-12-06: qty 1

## 2012-12-06 SURGICAL SUPPLY — 32 items
BENZOIN TINCTURE PRP APPL 2/3 (GAUZE/BANDAGES/DRESSINGS) ×2 IMPLANT
BLADE SURG SZ10 CARB STEEL (BLADE) IMPLANT
CHLORAPREP W/TINT 26ML (MISCELLANEOUS) ×2 IMPLANT
CLOTH BEACON ORANGE TIMEOUT ST (SAFETY) ×2 IMPLANT
DECANTER SPIKE VIAL GLASS SM (MISCELLANEOUS) IMPLANT
DERMABOND ADVANCED (GAUZE/BANDAGES/DRESSINGS) ×1
DERMABOND ADVANCED .7 DNX12 (GAUZE/BANDAGES/DRESSINGS) ×1 IMPLANT
DRAIN PENROSE 18X1/2 LTX STRL (DRAIN) IMPLANT
DRAPE LAPAROTOMY TRNSV 102X78 (DRAPE) ×2 IMPLANT
DRAPE UTILITY XL STRL (DRAPES) ×2 IMPLANT
DRSG TEGADERM 4X4.75 (GAUZE/BANDAGES/DRESSINGS) ×2 IMPLANT
ELECT CAUTERY BLADE 6.4 (BLADE) ×2 IMPLANT
ELECT REM PT RETURN 9FT ADLT (ELECTROSURGICAL) ×2
ELECTRODE REM PT RTRN 9FT ADLT (ELECTROSURGICAL) ×1 IMPLANT
GLOVE BIO SURGEON STRL SZ7.5 (GLOVE) ×2 IMPLANT
GLOVE SURG SS PI 7.5 STRL IVOR (GLOVE) ×4 IMPLANT
GOWN STRL NON-REIN LRG LVL3 (GOWN DISPOSABLE) ×2 IMPLANT
GOWN STRL REIN XL XLG (GOWN DISPOSABLE) ×4 IMPLANT
KIT BASIN OR (CUSTOM PROCEDURE TRAY) ×2 IMPLANT
MESH ULTRAPRO 3X6 7.6X15CM (Mesh General) ×2 IMPLANT
NEEDLE HYPO 22GX1.5 SAFETY (NEEDLE) ×2 IMPLANT
PACK GENERAL/GYN (CUSTOM PROCEDURE TRAY) ×2 IMPLANT
STRIP CLOSURE SKIN 1/2X4 (GAUZE/BANDAGES/DRESSINGS) ×2 IMPLANT
SUT MNCRL AB 4-0 PS2 18 (SUTURE) ×2 IMPLANT
SUT PROLENE 2 0 BLUE (SUTURE) ×8 IMPLANT
SUT PROLENE 2 0 SH DA (SUTURE) ×2 IMPLANT
SUT VIC AB 2-0 SH 27 (SUTURE) ×4
SUT VIC AB 2-0 SH 27X BRD (SUTURE) ×4 IMPLANT
SUT VIC AB 3-0 SH 27 (SUTURE) ×1
SUT VIC AB 3-0 SH 27X BRD (SUTURE) ×1 IMPLANT
SYR CONTROL 10ML LL (SYRINGE) ×2 IMPLANT
TOWEL OR 17X26 10 PK STRL BLUE (TOWEL DISPOSABLE) ×2 IMPLANT

## 2012-12-06 NOTE — Progress Notes (Signed)
Patient has not taken Metoprolol since last Friday. Pulse is 58. Dr. Renold Don states not to give.

## 2012-12-06 NOTE — Progress Notes (Addendum)
Spoke with OR nurse requesting orders for Jonathan [name]. Dr. Biagio Quint stated he would put in orders after he will take care of it. Called the OR to notify, spoke with [facility].

## 2012-12-06 NOTE — H&P (Signed)
He says that he has noticed this for over a year when he first noticed a bulge and some discomfort in the area. He had an ultrasound at that time as well which also demonstrated a spermatocele both it wasn't bothering him that much and did not feel that it was important to repair at that time. He says that over the years he still has only mild discomfort in the area and does not seem to limit his activities but he does do a lot of lifting at work and he says that the bulge has been increasing in size. He denies any nausea or vomiting or abdominal distention or obstructive symptoms. Since our last visit, he denies any changes and has since been cleared by his cardiologist.   HPI  Past Medical History   Diagnosis  Date   .  Myocardial infarction    .  Heart attack     Past Surgical History   Procedure  Date   .  Coronary artery bypass graft    .  Heart stents  01/2004   History reviewed. No pertinent family history.  Social History  History   Substance Use Topics   .  Smoking status:  Current Every Day Smoker -- 1.0 packs/day for 30 years     Types:  Cigarettes   .  Smokeless tobacco:  Never Used   .  Alcohol Use:  2.5 oz/week     5 drink(s) per week      Comment: consumes 1 pint per week   No Known Allergies  Current Outpatient Prescriptions   Medication  Sig  Dispense  Refill   .  albuterol (PROVENTIL HFA;VENTOLIN HFA) 108 (90 BASE) MCG/ACT inhaler  Inhale 2 puffs into the lungs every 4 (four) hours as needed for wheezing (cough, shortness of breath or wheezing.).  1 Inhaler  1   .  aspirin 81 MG tablet  Take 81 mg by mouth daily.     .  clopidogrel (PLAVIX) 75 MG tablet  Take 75 mg by mouth daily.     .  colesevelam (WELCHOL) 625 MG tablet  Take 1,875 mg by mouth 2 (two) times daily with a meal.     .  ezetimibe (ZETIA) 10 MG tablet  Take 10 mg by mouth at bedtime.     .  fenofibrate micronized (LOFIBRA) 200 MG capsule  Take 200 mg by mouth at bedtime.     .  metoprolol succinate  (TOPROL-XL) 25 MG 24 hr tablet  Take 25 mg by mouth daily.     .  ramipril (ALTACE) 10 MG capsule  Take 10 mg by mouth daily.     Review of Systems  Review of Systems  All other review of systems negative or noncontributory except as stated in the HPI  Wt Readings from Last 3 Encounters:  11/28/12 205 lb (92.987 kg)  10/05/12 207 lb 3.2 oz (93.985 kg)  09/23/12 204 lb (92.534 kg)   Temp Readings from Last 3 Encounters:  12/06/12 98 F (36.7 C)   12/06/12 98 F (36.7 C)   11/28/12 98 F (36.7 C) Oral   BP Readings from Last 3 Encounters:  12/06/12 147/77  12/06/12 147/77  11/28/12 138/76   Pulse Readings from Last 3 Encounters:  12/06/12 58  12/06/12 58  11/28/12 57    Physical Exam  Physical Exam  Physical Exam  Vitals reviewed.  Constitutional: He is oriented to person, place, and time. He appears well-developed and well-nourished.  No distress.  HENT:  Head: Normocephalic and atraumatic.  Mouth/Throat: No oropharyngeal exudate.  Eyes: Conjunctivae and EOM are normal. Pupils are equal, round, and reactive to light. Right eye exhibits no discharge. Left eye exhibits no discharge. No scleral icterus.  Neck: Normal range of motion. No tracheal deviation present.  Cardiovascular: Normal rate, regular rhythm and normal heart sounds.  Pulmonary/Chest: Effort normal and breath sounds normal. No stridor. No respiratory distress. He has slight wheezes. He has no rales. He exhibits no tenderness.  Abdominal: Soft. Bowel sounds are normal. He exhibits no distension and no mass. There is no tenderness. There is no rebound and no guarding. He does have a moderate size reducible right inguinal hernia without any left inguinal hernia. Musculoskeletal: Normal range of motion. He exhibits no edema and no tenderness.  Neurological: He is alert and oriented to person, place, and time.  Skin: Skin is warm and dry. No rash noted. He is not diaphoretic. No erythema. No pallor.  Psychiatric: He  has a normal mood and affect. His behavior is normal. Judgment and thought content normal.  Data Reviewed  Assessment  Reducible right inguinal hernia  He does have a reducible right inguinal hernia which is mildly symptomatic and enlarging over time. I discussed with him the options for continued watchful waiting versus open or laparoscopic repair. He would like to have surgical repair and I think open repair would be fine for him. I. Discussed with him the pros and cons of and the risks of the procedure. We discussed the risks of infection, bleeding, pain, scarring, recurrence, nerve injury and chronic pain, injury to the vas deferens and testicle, and cardiac complications and he expressed understanding and would like to proceed with open repair of right inguinal hernia.  The site was marked.

## 2012-12-06 NOTE — Transfer of Care (Signed)
Immediate Anesthesia Transfer of Care Note  Patient: Jonathan [name]  Procedure(s) Performed: Procedure(s): HERNIA REPAIR INGUINAL ADULT (Right) INSERTION OF MESH (Right)  Patient Location: PACU  Anesthesia Type:General  Level of Consciousness: awake, alert , oriented and patient cooperative  Airway & Oxygen Therapy: Patient Spontanous Breathing and Patient connected to face mask oxygen  Post-op Assessment: Report given to PACU RN and Post -op Vital signs reviewed and stable  Post vital signs: Reviewed and stable  Complications: No apparent anesthesia complications

## 2012-12-06 NOTE — Anesthesia Postprocedure Evaluation (Signed)
Anesthesia Post Note  Patient: Jonathan [name]  Procedure(s) Performed: Procedure(s) (LRB): HERNIA REPAIR INGUINAL ADULT (Right) INSERTION OF MESH (Right)  Anesthesia type: General  Patient location: PACU  Post pain: Pain level controlled  Post assessment: Post-op Vital signs reviewed  Last Vitals: BP 143/74  Pulse 59  Temp(Src) 36.8 C  Resp 16  SpO2 92%  Post vital signs: Reviewed  Level of consciousness: sedated  Complications: No apparent anesthesia complications

## 2012-12-06 NOTE — Brief Op Note (Signed)
12/06/2012  2:38 PM  PATIENT:  Jonathan [name]  62 y.o. male  PRE-OPERATIVE DIAGNOSIS:  inguinal hernia   POST-OPERATIVE DIAGNOSIS:  inguinal hernia  PROCEDURE:  Procedure(s): HERNIA REPAIR INGUINAL ADULT (Right) INSERTION OF MESH (Right)  SURGEON:  Surgeon(s) and Role:    * Lodema Pilot, DO - Primary  PHYSICIAN ASSISTANT:   ASSISTANTS: none   ANESTHESIA:   general  EBL:  Total I/O In: 1000 [I.V.:1000] Out: 10 [Blood:10]  BLOOD ADMINISTERED:none  DRAINS: none   LOCAL MEDICATIONS USED:  MARCAINE    and LIDOCAINE   SPECIMEN:  No Specimen  DISPOSITION OF SPECIMEN:  N/A  COUNTS:  YES  TOURNIQUET:  * No tourniquets in log *  DICTATION: .Other Dictation: Dictation Number dictated [id number]  PLAN OF CARE: Admit to inpatient   PATIENT DISPOSITION:  PACU - hemodynamically stable.   Delay start of Pharmacological VTE agent (>24hrs) due to surgical blood loss or risk of bleeding: no

## 2012-12-06 NOTE — Anesthesia Preprocedure Evaluation (Addendum)
Anesthesia Evaluation  Patient identified by MRN, date of birth, ID band Patient awake    Reviewed: Allergy & Precautions, H&P , NPO status , Patient's Chart, lab work & pertinent test results  Airway Mallampati: II TM Distance: >3 FB Neck ROM: Full    Dental  (+) Dental Advisory Given, Missing and Chipped   Pulmonary shortness of breath and with exertion, Current Smoker,  breath sounds clear to auscultation        Cardiovascular hypertension, Pt. on medications + CAD, + Past MI and + Cardiac Stents Rhythm:Regular Rate:Normal     Neuro/Psych negative neurological ROS  negative psych ROS   GI/Hepatic negative GI ROS, Neg liver ROS,   Endo/Other  negative endocrine ROS  Renal/GU negative Renal ROS     Musculoskeletal negative musculoskeletal ROS (+)   Abdominal (+) + obese,   Peds  Hematology negative hematology ROS (+)   Anesthesia Other Findings   Reproductive/Obstetrics                          Anesthesia Physical Anesthesia Plan  ASA: III  Anesthesia Plan: General   Post-op Pain Management:    Induction: Intravenous  Airway Management Planned: Oral ETT  Additional Equipment:   Intra-op Plan:   Post-operative Plan: Extubation in OR  Informed Consent: I have reviewed the patients History and Physical, chart, labs and discussed the procedure including the risks, benefits and alternatives for the proposed anesthesia with the patient or authorized representative who has indicated his/her understanding and acceptance.   Dental advisory given  Plan Discussed with: CRNA  Anesthesia Plan Comments:         Anesthesia Quick Evaluation

## 2012-12-06 NOTE — Progress Notes (Signed)
Pt does not want to lie on stretcher. Moved to recliner chair. He is smoker and coughing. Instructed to splint right groin with pillow.

## 2012-12-07 ENCOUNTER — Encounter (HOSPITAL_COMMUNITY): Payer: Self-pay | Admitting: General Surgery

## 2012-12-07 NOTE — Op Note (Signed)
NAME:  Jonathan [name], Jonathan [name] NO.:  [id number]  MEDICAL RECORD NO.:  [id number]  LOCATION:  [facility]                         FACILITY:  [facility]  PHYSICIAN:  Lodema Pilot, MD       DATE OF BIRTH:  1951/04/20  DATE OF PROCEDURE:  12/06/2012 DATE OF DISCHARGE:  12/06/2012                              OPERATIVE REPORT   PROCEDURE:  Open repair right inguinal hernia with mesh.  PREOPERATIVE DIAGNOSIS:  Right inguinal hernia.  POSTOPERATIVE DIAGNOSIS:  Right inguinal hernia.  SURGEON:  Lodema Pilot, MD  ASSISTANT:  None.  ANESTHESIA:  General LMA anesthesia with 30 mL of 1% lidocaine with epinephrine and 0.25% Marcaine in a 50:50 mixture.  FLUIDS:  1200 mL of crystalloid.  ESTIMATED BLOOD LOSS:  Minimal.  DRAINS:  None.  SPECIMENS:  None.  COMPLICATIONS:  None apparent.  FINDINGS:  The large indirect hernia and cord lipoma.  The internal ring was tightened and a 3-inch x 6-inch UltraPro mesh was sutured in place to cover the inguinal floor.  INDICATION FOR PROCEDURE:  Mr. Eastridge is a 62 year old male with mildly symptomatic moderate sized right inguinal hernia who desires operative repair.  OPERATIVE DETAILS:  Mr. Hoogendoorn was seen and evaluated in the preoperative area.  Risks and benefits of the procedure were again discussed in lay terms.  Informed consent was obtained.  Surgical site was marked prior to anesthetic administration and prophylactic antibiotics were given. He was taken to the operating room, placed on the table in supine position.  General endotracheal tube anesthesia was obtained and his abdomen was prepped and draped in the standard surgical fashion. Procedure time-out was performed with all operative team members to confirm proper patient, procedure.  An oblique incision was made in the right groin over the inguinal canal and dissection carried down through the subcutaneous tissues with Bovie electrocautery.  The external oblique fascia was  sharply incised and divided along the length of its fibers.  He had a fair amount of fat bulging in the inguinal canal.  I dissected around the cord at the pubic tubercle and passed a Penrose drain around the cord structures for gentle retraction.  He had a small cord lipoma laterally and then appeared to be a large amount of fatty tissue.  There was not an obvious hernia sac.  It was noted that the larger cord lipoma, which was reduced back into the preperitoneal space, and then tighten the internal ring with a few interrupted 2-0 Vicryl sutures in order to prevent prolapsing through subcu tissue.  I skeletonized the cords further to ensure there was no other hernia sac identified and I did not identify the other hernia.  The wound was irrigated with sterile saline solution and was noted be hemostatic and I cut a 3-inch x 6-inch UltraPro mesh to fit the inguinal canal, and this was sutured in place at the pubic tubercle with a 2-0 Prolene suture, and then a 2-0 Prolene suture was run along the shelving edge of the inguinal ligament lateral to the internal ring.  Slit was placed in the lateral aspect of the mesh and the tails of the mesh were placed around the area  of the spermatic cord.  Then, the mesh was sutured in place medially and cephalad to the abdominal wall with several interrupted 2-0 Prolene sutures.  Care was taken to avoid injury to neurovascular structures.  Interestingly, I was not able to identify the ilioinguinal nerve in its usual position, the anterior to the cord I did no identify laterally as well.  The tails of the mesh were overlapped laterally and sutured together and a new internal ring was created.  The mesh was tacked laterally as well.  The mesh appeared to lay flat and covered with indirect and direct defects.  The wound was again irrigated with sterile saline solution to be hemostatic.  The external oblique fascia was approximated with a running 2-0 Vicryl  suture and the wound was irrigated with 30 mL of 1% lidocaine with epinephrine and 0.25% Marcaine in a 50:50 mixture.  The Scarpa fascia was approximated with a running 3- 0 Vicryl suture.  The skin edges were approximated with 4-0 Monocryl subcuticular suture.  Skin was washed and dried, and Dermabond was applied.  All sponge, needle, and instrument counts were correct in the case.  The patient tolerated the procedure well without apparent complications.          ______________________________ Lodema Pilot, MD     BL/MEDQ  D:  12/06/2012  T:  12/07/2012  Job:  [id number]

## 2012-12-10 ENCOUNTER — Telehealth (INDEPENDENT_AMBULATORY_CARE_PROVIDER_SITE_OTHER): Payer: Self-pay

## 2012-12-10 ENCOUNTER — Telehealth (INDEPENDENT_AMBULATORY_CARE_PROVIDER_SITE_OTHER): Payer: Self-pay | Admitting: General Surgery

## 2012-12-10 NOTE — Telephone Encounter (Signed)
The pt called reporting swelling and bruising in his scrotum.  I told him this is normal after the surgery that he had.  I advised he can take Ibuprofen and elevate his scrotum.  I asked him to call us in a couple days if worsens.  He has no trouble urinating.  He has not had a bm since Thursday.  I advised increase liquid intake and try M.O. M.  He is no longer on narcotics.  I gave him his postop appointment.

## 2012-12-10 NOTE — Telephone Encounter (Signed)
Left message for spouse to call office  for po f/u visit

## 2012-12-21 ENCOUNTER — Ambulatory Visit (INDEPENDENT_AMBULATORY_CARE_PROVIDER_SITE_OTHER): Payer: 59 | Admitting: General Surgery

## 2012-12-21 ENCOUNTER — Encounter (INDEPENDENT_AMBULATORY_CARE_PROVIDER_SITE_OTHER): Payer: Self-pay | Admitting: General Surgery

## 2012-12-21 VITALS — BP 122/72 | HR 84 | Resp 18 | Ht 67.0 in | Wt 206.0 lb

## 2012-12-21 DIAGNOSIS — Z5189 Encounter for other specified aftercare: Secondary | ICD-10-CM

## 2012-12-21 DIAGNOSIS — Z4889 Encounter for other specified surgical aftercare: Secondary | ICD-10-CM

## 2012-12-21 NOTE — Progress Notes (Signed)
Subjective:     Patient ID: Jonathan [name], male   DOB: 07/28/1951, 62 y.o.   MRN: [id number]  HPI Follow up 2 weeks s/p open RIH with mesh.  He is doing well and off pain meds.  He had some bruising and swelling in scrotum postop but this has resolved.  His preop pain has resolved. He is asking to return to work.    Review of Systems     Objective:   Physical Exam His incision is healing nicely without any sign of infection or recurrence he has a normal healing ridge without any sign of recurrence with Valsalva. I do not see any scrotal swelling or bruising.    Assessment:     Status post open repair of right inguinal hernia-doing well He is doing very well from the surgery and no longer has any postoperative discomfort. His preoperative symptoms have improved as well. 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 Otherwise after that date he can gradually increase his activity as tolerated and followup with me when necessary     Plan:     Lifting or strictures for another 2 weeks and then he can gradually increase his activity as tolerated and followup when necessary. I filled out his short term disability form today.

## 2013-04-16 ENCOUNTER — Ambulatory Visit (INDEPENDENT_AMBULATORY_CARE_PROVIDER_SITE_OTHER): Payer: 59 | Admitting: Emergency Medicine

## 2013-04-16 VITALS — BP 118/68 | HR 58 | Temp 98.1°F | Resp 16 | Ht 67.0 in | Wt 208.0 lb

## 2013-04-16 DIAGNOSIS — J018 Other acute sinusitis: Secondary | ICD-10-CM

## 2013-04-16 DIAGNOSIS — J441 Chronic obstructive pulmonary disease with (acute) exacerbation: Secondary | ICD-10-CM

## 2013-04-16 MED ORDER — AMOXICILLIN-POT CLAVULANATE 875-125 MG PO TABS: 1.0000 | ORAL_TABLET | Freq: Two times a day (BID) | ORAL | Status: DC

## 2013-04-16 MED ORDER — FLUTICASONE-SALMETEROL 250-50 MCG/DOSE IN AEPB: 1.0000 | INHALATION_SPRAY | Freq: Two times a day (BID) | RESPIRATORY_TRACT | Status: DC

## 2013-04-16 MED ORDER — PSEUDOEPHEDRINE-GUAIFENESIN ER 60-600 MG PO TB12: 1.0000 | ORAL_TABLET | Freq: Two times a day (BID) | ORAL | Status: DC

## 2013-04-16 NOTE — Progress Notes (Signed)
[facility] [address] [place] [address] [phone]  Date:  04/16/2013   Name:  Jonathan [name]   DOB:  May 27, 1951   MRN:  [id number]  PCP:  Provider Not In System    Chief Complaint: Sore Throat and Cough   History of Present Illness:  Jonathan [name] is a 62 y.o. very pleasant male patient who presents with the following:  Seen previously with sinusitis and bronchitis.  Continues to smoke and using albuterol daily.  Has nasal congestion and drainage and a post nasal drip that is purulent.  Has cough productive purulent sputum.  Wheezing regularly.  No shortness of breath at rest.  No fever or chills.  No nausea or vomiting.  No improvement with over the counter medications or other home remedies. Denies other complaint or health concern today.   Patient Active Problem List   Diagnosis Date Noted  . Essential hypertension, benign 09/23/2012  . Heart attack 09/23/2012  . Hyperlipidemia 09/23/2012  . 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 Hernia, inguinal 09/23/2012    Past Medical History  Diagnosis Date  . Myocardial infarction   . Heart attack   . Hypertension   . Shortness of breath     with exertion   . Arthritis     hands   . Chronic bronchitis     Past Surgical History  Procedure Laterality Date  . Heart stents  01/2004  . Cardiac catheterization    . Coronary angioplasty      stents- 01/2004   . Inguinal hernia repair Right 12/06/2012    Procedure: HERNIA REPAIR INGUINAL ADULT;  Surgeon: Lodema Pilot, DO;  Location: [facility];  Service: General;  Laterality: Right;  . Insertion of mesh Right 12/06/2012    Procedure: INSERTION OF MESH;  Surgeon: Lodema Pilot, DO;  Location: [facility];  Service: General;  Laterality: Right;    History  Substance Use Topics  . Smoking status: Current Every Day Smoker -- 1.00 packs/day for 30 years    Types: Cigarettes  . Smokeless tobacco: Never Used  . Alcohol Use: 2.5 oz/week    5 drink(s) per week     Comment:  consumes 1 pint per week    No family history on file.  No Known Allergies  Medication list has been reviewed and updated.  Current Outpatient Prescriptions on File Prior to Visit  Medication Sig Dispense Refill  . albuterol (PROVENTIL HFA;VENTOLIN HFA) 108 (90 BASE) MCG/ACT inhaler Inhale 2 puffs into the lungs every 4 (four) hours as needed for wheezing (cough, shortness of breath or wheezing.).  1 Inhaler  1  . aspirin 81 MG tablet Take 81 mg by mouth daily.      . clopidogrel (PLAVIX) 75 MG tablet Take 75 mg by mouth daily before breakfast.       . ezetimibe (ZETIA) 10 MG tablet Take 10 mg by mouth at bedtime.      . metoprolol succinate (TOPROL-XL) 25 MG 24 hr tablet Take 25 mg by mouth daily before breakfast.       . ramipril (ALTACE) 10 MG capsule Take 10 mg by mouth daily before breakfast.       . oxyCODONE (ROXICODONE) 5 MG immediate release tablet Take 1-2 tablets (5-10 mg total) by mouth every 4 (four) hours as needed for pain.  40 tablet  0   No current facility-administered medications on file prior to visit.    Review of Systems:  As per HPI,  otherwise negative.    Physical Examination: Filed Vitals:   04/16/13 1436  BP: 118/68  Pulse: 58  Temp: 98.1 F (36.7 C)  Resp: 16   Filed Vitals:   04/16/13 1436  Height: 5\' 7"  (1.702 m)  Weight: 208 lb (94.348 kg)   Body mass index is 32.57 kg/(m^2). Ideal Body Weight: Weight in (lb) to have BMI = 25: 159.3  GEN: WDWN, NAD, Non-toxic, A & O x 3 HEENT: Atraumatic, Normocephalic. Neck supple. No masses, No LAD. Ears and Nose: No external deformity. CV: RRR, No M/G/R. No JVD. No thrill. No extra heart sounds. PULM: CTA B, no wheezes, crackles, rhonchi. No retractions. No resp. distress. No accessory muscle use. ABD: S, NT, ND, +BS. No rebound. No HSM. EXTR: No c/c/e NEURO Normal gait.  PSYCH: Normally interactive. Conversant. Not depressed or anxious appearing.  Calm demeanor.    Assessment and  Plan: Sinusitis  Bronchitis with bronchospasm. augmentin mucinex d advair   Signed,  Phillips Odor, MD

## 2013-04-16 NOTE — Patient Instructions (Addendum)

## 2013-07-08 ENCOUNTER — Other Ambulatory Visit: Payer: Self-pay | Admitting: Cardiovascular Disease

## 2013-07-08 NOTE — Telephone Encounter (Signed)
Rx was sent to pharmacy electronically. 

## 2013-07-10 ENCOUNTER — Telehealth: Payer: Self-pay | Admitting: *Deleted

## 2013-07-19 ENCOUNTER — Ambulatory Visit: Payer: 59 | Admitting: Cardiovascular Disease

## 2013-07-25 ENCOUNTER — Encounter: Payer: Self-pay | Admitting: Cardiovascular Disease

## 2013-07-25 ENCOUNTER — Ambulatory Visit (INDEPENDENT_AMBULATORY_CARE_PROVIDER_SITE_OTHER): Payer: 59 | Admitting: Cardiovascular Disease

## 2013-07-25 VITALS — BP 122/70 | HR 61 | Ht 68.0 in | Wt 209.4 lb

## 2013-07-25 DIAGNOSIS — I2510 Atherosclerotic heart disease of native coronary artery without angina pectoris: Secondary | ICD-10-CM

## 2013-07-25 DIAGNOSIS — I252 Old myocardial infarction: Secondary | ICD-10-CM

## 2013-07-25 DIAGNOSIS — I10 Essential (primary) hypertension: Secondary | ICD-10-CM

## 2013-07-25 DIAGNOSIS — R5381 Other malaise: Secondary | ICD-10-CM

## 2013-07-25 DIAGNOSIS — E785 Hyperlipidemia, unspecified: Secondary | ICD-10-CM

## 2013-07-25 HISTORY — DX: Old myocardial infarction: I25.2

## 2013-07-25 NOTE — Patient Instructions (Signed)
Your physician recommends that you return for lab work in: fasting.  Your physician recommends that you schedule a follow-up appointment in: 1 YEAR. No changes were made today in your therapy.

## 2013-07-25 NOTE — Progress Notes (Signed)
Patient ID: Jonathan [name], male   DOB: [birth year], 62 y.o.   MRN: [id number]     HPI: Jonathan [name] is a 62 y.o. male who presents to the office today for one-year cardiology evaluation.  On 01/11/2004 this occurred subsequent to her wall myocardial infarction. Cardiac catheterization revealed total occlusion of the right coronary artery and underwent successful intervention with ultimate insertion of a 3.0x33 mm and 3.0x13 mm drug-eluting cipher stent with restoration of TIMI-3 flow. Additional problems include hyperlipidemia. Remotely he has had intolerance to statins secondary to increased LFTs as well as CPK elevation. He had been on WelChol fenofibrate Zetia but apparently has not taking any lipid lowering therapy presently. His last nuclear perfusion study in March 2013 was unchanged from prior studies and did not show any evidence for scar or ischemia with only minimal residual basilar inferior thinning. Significant myocardial salvage from his MRI. Ejection fraction was 57%.  Over the past year, he feels he has been stable from a cardiovascular standpoint. He underwent hernia surgery without cardiovascular compromise. He also has had issues with bronchitis requiring when necessary inhalation treatment.  He denies any change in exercise tolerance. He denies chest pressure. He is unaware of tachycardia dysrhythmias. He is still smoking.  Past Medical History  Diagnosis Date  . Myocardial infarction   . Heart attack   . Hypertension   . Shortness of breath     with exertion   . Arthritis     hands   . Chronic bronchitis     Past Surgical History  Procedure Laterality Date  . Heart stents  01/2004  . Cardiac catheterization    . Coronary angioplasty      stents- 01/2004   . Inguinal hernia repair Right 12/06/2012    Procedure: HERNIA REPAIR INGUINAL ADULT;  Surgeon: Jonathan Pilot, DO;  Location: [facility];  Service: General;  Laterality: Right;  . Insertion of mesh Right 12/06/2012   Procedure: INSERTION OF MESH;  Surgeon: Jonathan Pilot, DO;  Location: [facility];  Service: General;  Laterality: Right;    No Known Allergies  Current Outpatient Prescriptions  Medication Sig Dispense Refill  . aspirin 81 MG tablet Take 81 mg by mouth daily.      . clopidogrel (PLAVIX) 75 MG tablet Take 75 mg by mouth daily before breakfast.       . Fluticasone-Salmeterol (ADVAIR DISKUS) 250-50 MCG/DOSE AEPB Inhale 1 puff into the lungs 2 (two) times daily.  60 each  12  . metoprolol succinate (TOPROL-XL) 25 MG 24 hr tablet Take 25 mg by mouth daily before breakfast.       . ramipril (ALTACE) 10 MG capsule Take 1 capsule (10 mg total) by mouth daily.  90 capsule  0   No current facility-administered medications for this visit.    History   Social History  . Marital Status: Married    Spouse Name: N/A    Number of Children: N/A  . Years of Education: N/A   Occupational History  . Not on file.   Social History Main Topics  . Smoking status: Current Every Day Smoker -- 1.00 packs/day for 30 years    Types: Cigarettes  . Smokeless tobacco: Never Used  . Alcohol Use: 2.5 oz/week    5 drink(s) per week     Comment: consumes 1 pint per week  . Drug Use: No  . Sexual Activity: Yes   Other Topics Concern  . Not on file   Social History  Narrative  . No narrative on file   Additional social history is notable that he is married has 2 children. Has one grandchild. He works in Landscape architect and remains busy. He still smoking. He does use occasional alcohol. History reviewed. No pertinent family history.  ROS is negative for fevers, chills or night sweats.  He denies rash. He denies skin lesions. He does have intermittent episodes of bronchitis. He denies chest pressure. He denies presyncope or syncope. He denies abdominal pain. He denies constipation. He denies nausea or vomiting. He denies blood in the stool or urine. He denies any urinary symptoms. He states his cramps did  improve when she stopped WelChol. He denies edema. There is no claudication. He is unaware of any diabetes or thyroid problems.  Other comprehensive 12 point system review is negative.  PE BP 122/70  Pulse 61  Ht 5\' 8"  (1.727 m)  Wt 209 lb 6.4 oz (94.983 kg)  BMI 31.85 kg/m2  General: Alert, oriented, no distress.  Skin: normal turgor, no rashes HEENT: Normocephalic, atraumatic. Pupils round and reactive; sclera anicteric;no lid lag.  Nose without nasal septal hypertrophy Mouth/Parynx benign; Mallinpatti scale 3  Neck: No JVD, no carotid briuts Lungs: clear to ausculatation and percussion; no wheezing or rales Heart: RRR, s1 s2 normal 1/6 systolic murmur Abdomen: soft, nontender; no hepatosplenomehaly, BS+; abdominal aorta nontender and not dilated by palpation. Pulses 2+ Extremities: no clubbing cyanosis or edema, Homan's sign negative  Neurologic: grossly nonfocal Psychologic: normal affect and mood.  ECG: Sinus rhythm at 61 beats per minute. No ectopy. Normal intervals.  LABS:  BMET    Component Value Date/Time   NA 136 11/28/2012 1105   K 5.1 11/28/2012 1105   CL 97 11/28/2012 1105   CO2 29 11/28/2012 1105   GLUCOSE 90 11/28/2012 1105   BUN 15 11/28/2012 1105   CREATININE 1.02 11/28/2012 1105   CALCIUM 9.5 11/28/2012 1105   GFRNONAA 76* 11/28/2012 1105   GFRAA 88* 11/28/2012 1105     Hepatic Function Panel  No results found for this basename: prot, albumin, ast, alt, alkphos, bilitot, bilidir, ibili     CBC    Component Value Date/Time   WBC 6.9 11/28/2012 1105   RBC 5.09 11/28/2012 1105   HGB 15.9 11/28/2012 1105   HCT 47.5 11/28/2012 1105   PLT 241 11/28/2012 1105   MCV 93.3 11/28/2012 1105   MCH 31.2 11/28/2012 1105   MCHC 33.5 11/28/2012 1105   RDW 13.1 11/28/2012 1105     BNP No results found for this basename: probnp    Lipid Panel  No results found for this basename: chol, trig, hdl, cholhdl, vldl, ldlcalc     RADIOLOGY: No results  found.    ASSESSMENT AND PLAN: [name] is a 62 year old gentleman who continues to have ongoing tobacco use. He is 9 years since his inferior wall myocardial infarction which time he was taken acutely to the catheterization laboratory and underwent successful intervention to a totally occluded mid RCA in head tandem drug-eluting Cypher stent inserted. A concomitant CAD involving his LAD diagonal vessel. He apparently developed CPK and LFT elevation with statin therapy. Also stopped taking all of her lipid-lowering treatment. He denies change in exercise tolerance. He denies dyspnea. I will recommend followup laboratory be obtained in the fasting state. We again discussed importance of smoking cessation. We will contact him regarding the results of the blood work heard lungs he remained stable and seem in one year for  cardiology reevaluation or sooner if problem arise.     Lennette Bihari, MD, [facility]  07/25/2013 2:49 PM

## 2013-08-13 ENCOUNTER — Telehealth: Payer: Self-pay

## 2013-08-13 NOTE — Telephone Encounter (Signed)
Patient needs a refill on his inhaler, ProAir. [facility]   [phone]

## 2013-08-13 NOTE — Telephone Encounter (Signed)
We have never provided him the ProAir, he indicates Dr Dareen Piano has given it to him. I asked about the Advair, but he states he also needs the ProAir, and Dr Dareen Piano has done this in the past please advise.

## 2013-08-14 ENCOUNTER — Other Ambulatory Visit: Payer: Self-pay | Admitting: Emergency Medicine

## 2013-08-14 MED ORDER — ALBUTEROL SULFATE HFA 108 (90 BASE) MCG/ACT IN AERS: 2.0000 | INHALATION_SPRAY | RESPIRATORY_TRACT | Status: DC | PRN

## 2013-08-14 NOTE — Telephone Encounter (Signed)
Called him to advise.  

## 2013-08-14 NOTE — Telephone Encounter (Signed)
Actually, I have only seen him once and did NOT order his proair for him.  I did put in an order for it this time.

## 2013-10-07 ENCOUNTER — Other Ambulatory Visit: Payer: Self-pay | Admitting: Cardiovascular Disease

## 2013-10-07 NOTE — Telephone Encounter (Signed)
Rx was sent to pharmacy electronically. 

## 2013-11-29 ENCOUNTER — Other Ambulatory Visit: Payer: Self-pay | Admitting: Cardiovascular Disease

## 2013-11-29 NOTE — Telephone Encounter (Signed)
Rx was sent to pharmacy electronically. 

## 2013-12-02 ENCOUNTER — Other Ambulatory Visit: Payer: Self-pay

## 2013-12-02 MED ORDER — METOPROLOL SUCCINATE ER 25 MG PO TB24: 25.0000 mg | ORAL_TABLET | Freq: Every day | ORAL | Status: DC

## 2013-12-02 NOTE — Telephone Encounter (Signed)
Rx was sent to pharmacy electronically. 

## 2014-01-02 NOTE — Telephone Encounter (Signed)
Encounter Closed--TP 01/02/2014 

## 2014-06-10 ENCOUNTER — Other Ambulatory Visit: Payer: Self-pay | Admitting: Cardiovascular Disease

## 2014-06-10 ENCOUNTER — Other Ambulatory Visit: Payer: Self-pay | Admitting: Emergency Medicine

## 2014-06-10 NOTE — Telephone Encounter (Signed)
Rx was sent to pharmacy electronically. 

## 2014-06-27 ENCOUNTER — Other Ambulatory Visit: Payer: Self-pay | Admitting: Cardiovascular Disease

## 2014-06-27 NOTE — Telephone Encounter (Signed)
Rx was sent to pharmacy electronically. 

## 2014-07-28 ENCOUNTER — Encounter: Payer: Self-pay | Admitting: Cardiovascular Disease

## 2014-07-30 ENCOUNTER — Encounter: Payer: Self-pay | Admitting: Cardiovascular Disease

## 2014-08-01 ENCOUNTER — Encounter: Payer: Self-pay | Admitting: *Deleted

## 2014-08-05 ENCOUNTER — Ambulatory Visit (INDEPENDENT_AMBULATORY_CARE_PROVIDER_SITE_OTHER): Payer: Medicare Other | Admitting: Cardiovascular Disease

## 2014-08-05 ENCOUNTER — Encounter: Payer: Self-pay | Admitting: Cardiovascular Disease

## 2014-08-05 VITALS — BP 180/84 | HR 52 | Ht 67.0 in | Wt 207.8 lb

## 2014-08-05 DIAGNOSIS — E785 Hyperlipidemia, unspecified: Secondary | ICD-10-CM

## 2014-08-05 DIAGNOSIS — I10 Essential (primary) hypertension: Secondary | ICD-10-CM

## 2014-08-05 DIAGNOSIS — I252 Old myocardial infarction: Secondary | ICD-10-CM

## 2014-08-05 DIAGNOSIS — Z716 Tobacco abuse counseling: Secondary | ICD-10-CM | POA: Insufficient documentation

## 2014-08-05 NOTE — Progress Notes (Signed)
Patient ID: [name], male   DOB: 08-02-1951, 63 y.o.   MRN: [id number]    HPI: GIDEON BURSTEIN is a 63 y.o. male who presents to the office today for one-year cardiology evaluation.   Mr. Teryl Lucy has a history of coronary artery disease and on 01/11/2004 suffered an inferior wall myocardial infarction. Cardiac catheterization revealed total occlusion of the RCA and underwent successful intervention with ultimate insertion of a 3.0x33 mm and 3.0x13 mm drug-eluting Cipher stent with restoration of TIMI-3 flow. Additional problems include hyperlipidemia and ongoing tobacco use.  He admits to smoking at least one pack of cigarettes per day. He has a history of hypertension and has been on ramipril 10 mg in addition to Toprol-XL 25 mg.  He's been maintained on dual antiplatelet therapy with aspirin and Plavix.  He also takes Advair Diskus for his lung disease.  He has had intolerance to statins secondary to increased LFTs as well as CPK elevation. He had been on WelChol, fenofibrate, Zetia but apparently has not taking any lipid lowering therapy presently. His last nuclear perfusion study in March 2013 was unchanged from prior studies and did not show any evidence for scar or ischemia with only minimal residual basilar inferior thinning.  There was significant myocardial salvage from his successful reperfusion  In the setting of his MI.Ejection fraction was 57%.   over the past year, is not been regularly exercising. He denies any anginal symptomatology. He does have some mild shortness of breath with activity. He sees Dr. Claris Gower, for primary care. I did review laboratory that was done on 07/15/2014. He had normal renal function with a BUN of 11, crying 0.98.  His lipids were markedly abnormal with a total cholesterol of 270, triglycerides of 368, VLDL of 74, and LDL cholesterol of 152.  HDL cholesterol was 44. Hemoglobin C was 5.6.   Past Medical History  Diagnosis Date  . Myocardial infarction     . Heart attack   . Hypertension   . Shortness of breath     with exertion   . Arthritis     hands   . Chronic bronchitis     Past Surgical History  Procedure Laterality Date  . Heart stents  01/2004  . Cardiac catheterization    . Coronary angioplasty      stents- 01/2004   . Inguinal hernia repair Right 12/06/2012    Procedure: HERNIA REPAIR INGUINAL ADULT;  Surgeon: [name], DO;  Location: [facility];  Service: General;  Laterality: Right;  . Insertion of mesh Right 12/06/2012    Procedure: INSERTION OF MESH;  Surgeon: [name], DO;  Location: [facility];  Service: General;  Laterality: Right;  . Cardiovascular stress test  11/11/2011    Allergies  Allergen Reactions  . Niaspan [Niacin Er]     Current Outpatient Prescriptions  Medication Sig Dispense Refill  . albuterol (PROVENTIL HFA;VENTOLIN HFA) 108 (90 BASE) MCG/ACT inhaler Inhale 2 puffs into the lungs every 4 (four) hours as needed for wheezing or shortness of breath (cough, shortness of breath or wheezing.). 1 Inhaler 1  . aspirin 81 MG tablet Take 81 mg by mouth daily.    . clopidogrel (PLAVIX) 75 MG tablet Take 1 tablet by mouth  daily 90 tablet 0  . Fluticasone-Salmeterol (ADVAIR DISKUS) 250-50 MCG/DOSE AEPB Inhale 1 puff into the lungs 2 (two) times daily. 60 each 12  . metoprolol succinate (TOPROL-XL) 25 MG 24 hr tablet Take 1 tablet (25 mg total)  by mouth daily before  breakfast. 90 tablet 0  . ramipril (ALTACE) 10 MG capsule Take 1 capsule by mouth  daily 90 capsule 0   No current facility-administered medications for this visit.    History   Social History  . Marital Status: Married    Spouse Name: N/A    Number of Children: N/A  . Years of Education: N/A   Occupational History  . Not on file.   Social History Main Topics  . Smoking status: Current Every Day Smoker -- 1.00 packs/day for 30 years    Types: Cigarettes  . Smokeless tobacco: Never Used  . Alcohol Use: 2.5 oz/week    5 drink(s) per week      Comment: consumes 1 pint per week  . Drug Use: No  . Sexual Activity: Yes   Other Topics Concern  . Not on file   Social History Narrative   Additional social history is notable that he is married has 2 children. Has one grandchild. He works in Education officer, museum and remains busy. He still smoking. He does use occasional alcohol.  Family history is notable that his father died with heart disease at age 24.  ROS General: Negative; No fevers, chills, or night sweats;  HEENT: Negative; No changes in vision or hearing, sinus congestion, difficulty swallowing Pulmonary:  Mild shortness of breath, no hemoptysis Cardiovascular: Negative; No chest pain, presyncope, syncope, palpitations GI: Negative; No nausea, vomiting, diarrhea, or abdominal pain GU: Negative; No dysuria, hematuria, or difficulty voiding Musculoskeletal: Negative; no myalgias, joint pain, or weakness Hematologic/Oncology: Negative; no easy bruising, bleeding Endocrine: Negative; no heat/cold intolerance; no diabetes Neuro: Negative; no changes in balance, headaches Skin: Negative; No rashes or skin lesions Psychiatric: Negative; No behavioral problems, depression Sleep: Negative; No snoring, daytime sleepiness, hypersomnolence, bruxism, restless legs, hypnogognic hallucinations, no cataplexy Other comprehensive 14 point system review is negative.   PE BP 180/84 mmHg  Pulse 52  Ht 5\' 7"  (1.702 m)  Wt 207 lb 12.8 oz (94.257 kg)  BMI 32.54 kg/m2  General: Alert, oriented, no distress.  Skin: normal turgor, no rashes HEENT: Normocephalic, atraumatic. Pupils round and reactive; sclera anicteric;no lid lag.  Nose without nasal septal hypertrophy Mouth/Parynx benign; Mallinpatti scale 3  Neck: No JVD, no carotid bruits Lungs:  Diffusely decreased breath sounds.; no wheezing or rales  chest wall: Nontender to palpation Heart: RRR, s1 s2 normal 1/6 systolic murmur; .  No S3 gallop.  No iastolic murmur.  No rubs  thrills or heaves Abdomen: soft, nontender; no hepatosplenomehaly, BS+; abdominal aorta nontender and not dilated by palpation.  back: No CVA tenderness Pulses 2+ Extremities: no clubbing cyanosis or edema, Homan's sign negative  Neurologic: grossly nonfocal Psychologic: normal affect and mood.  ECG (independently read by me): Sinus bradycardia 52 bpm  ECG: Sinus rhythm at 61 beats per minute. No ectopy. Normal intervals.  LABS:  BMET    Component Value Date/Time   NA 136 11/28/2012 1105   K 5.1 11/28/2012 1105   CL 97 11/28/2012 1105   CO2 29 11/28/2012 1105   GLUCOSE 90 11/28/2012 1105   BUN 15 11/28/2012 1105   CREATININE 1.02 11/28/2012 1105   CALCIUM 9.5 11/28/2012 1105   GFRNONAA 76* 11/28/2012 1105   GFRAA 88* 11/28/2012 1105     Hepatic Function Panel  No results found for: PROT   CBC    Component Value Date/Time   WBC 6.9 11/28/2012 1105   RBC 5.09 11/28/2012 1105   HGB  15.9 11/28/2012 1105   HCT 47.5 11/28/2012 1105   PLT 241 11/28/2012 1105   MCV 93.3 11/28/2012 1105   MCH 31.2 11/28/2012 1105   MCHC 33.5 11/28/2012 1105   RDW 13.1 11/28/2012 1105     BNP No results found for: PROBNP  Lipid Panel  No results found for: CHOL   RADIOLOGY: No results found.    ASSESSMENT AND PLAN: Mr. Vence Lalor is a 63 year old gentleman who is 10 years since his inferior wall myocardial infarction which time he was taken acutely to the catheterization laboratory and underwent successful intervention to a totally occluded mid RCA  Insertion of tandem drug-eluting Cypher stent insertion.  He had concomitant CAD involving his LAD diagonal vessel. He developed CPK and LFT elevation with statin therapy  also stopped taking all of her lipid-lowering treatment.  Unfortunately he continues to smoke cigarettes and is currently smoking 1 pack per day.[name]  He is not certain if he will quit smoking.  I had a discussion with him today concerning new therapy with reference  to his markedly abnormal lipid studies , particularly with his coronary artery disease.  I discussed with him the new drug.  Prior low went or path the as PCSK9 inhibitors.  He apparently does not have any interest in pursuing this.  However, I did give him additional literature so that he mayhave data to make a more objective decision rather than an emotional decision concerning not wanting treatment. I discussed the importance of smoking cessation. He is not having ongoing angina presently. His blood pressure when taken by the nurse was markedly elevated at 180/84 but when rechecked by me was 120/64.  He will monitor his blood pressure closely. He will contact us if he decides to pursue this new lipid-lowering strategy but otherwise if not, I will see him in 6 months for reevaluation.   Time spent: 25 minutes   [name], MD, [facility]  08/05/2014 9:53 AM

## 2014-08-05 NOTE — Patient Instructions (Signed)
Your physician wants you to follow-up in: 6 Months You will receive a reminder letter in the mail two months in advance. If you don't receive a letter, please call our office to schedule the follow-up appointment.  

## 2014-08-22 ENCOUNTER — Other Ambulatory Visit: Payer: Self-pay | Admitting: *Deleted

## 2014-08-22 DIAGNOSIS — Z79899 Other long term (current) drug therapy: Secondary | ICD-10-CM

## 2014-08-22 DIAGNOSIS — E785 Hyperlipidemia, unspecified: Secondary | ICD-10-CM

## 2014-08-22 MED ORDER — FENOFIBRATE 145 MG PO TABS: 145.0000 mg | ORAL_TABLET | Freq: Every day | ORAL | Status: DC

## 2014-08-22 MED ORDER — EZETIMIBE 10 MG PO TABS: 10.0000 mg | ORAL_TABLET | Freq: Every day | ORAL | Status: DC

## 2014-08-22 MED ORDER — FISH OIL 1000 MG PO CAPS: 2.0000 | ORAL_CAPSULE | Freq: Every day | ORAL | Status: DC

## 2014-09-11 ENCOUNTER — Other Ambulatory Visit: Payer: Self-pay | Admitting: Cardiovascular Disease

## 2014-09-11 NOTE — Telephone Encounter (Signed)
E sent to pharm 

## 2014-12-15 ENCOUNTER — Other Ambulatory Visit: Payer: Self-pay | Admitting: Cardiovascular Disease

## 2014-12-15 NOTE — Telephone Encounter (Signed)
Rx(s) sent to pharmacy electronically.  

## 2015-01-12 ENCOUNTER — Other Ambulatory Visit: Payer: Self-pay | Admitting: Cardiovascular Disease

## 2015-01-26 ENCOUNTER — Encounter: Payer: Self-pay | Admitting: Cardiovascular Disease

## 2015-07-22 ENCOUNTER — Other Ambulatory Visit: Payer: Self-pay | Admitting: Cardiovascular Disease

## 2015-08-10 ENCOUNTER — Other Ambulatory Visit: Payer: Self-pay | Admitting: Cardiovascular Disease

## 2015-09-10 ENCOUNTER — Other Ambulatory Visit: Payer: Self-pay | Admitting: Cardiovascular Disease

## 2015-09-30 ENCOUNTER — Other Ambulatory Visit: Payer: Self-pay | Admitting: Cardiovascular Disease

## 2015-10-12 ENCOUNTER — Other Ambulatory Visit: Payer: Self-pay | Admitting: *Deleted

## 2015-10-12 MED ORDER — METOPROLOL SUCCINATE ER 25 MG PO TB24: 25.0000 mg | ORAL_TABLET | Freq: Every day | ORAL | Status: DC

## 2015-10-12 MED ORDER — CLOPIDOGREL BISULFATE 75 MG PO TABS: ORAL_TABLET | ORAL | Status: DC

## 2015-10-12 NOTE — Telephone Encounter (Signed)
Rx request sent to pharmacy.  

## 2015-11-02 ENCOUNTER — Other Ambulatory Visit: Payer: Self-pay | Admitting: Cardiovascular Disease

## 2015-11-02 NOTE — Telephone Encounter (Signed)
Rx request sent to pharmacy.  

## 2015-12-15 ENCOUNTER — Ambulatory Visit (INDEPENDENT_AMBULATORY_CARE_PROVIDER_SITE_OTHER): Payer: Medicare Other | Admitting: Cardiovascular Disease

## 2015-12-15 ENCOUNTER — Encounter: Payer: Self-pay | Admitting: Cardiovascular Disease

## 2015-12-15 VITALS — BP 132/76 | HR 61 | Ht 66.0 in | Wt 213.8 lb

## 2015-12-15 DIAGNOSIS — Z79899 Other long term (current) drug therapy: Secondary | ICD-10-CM

## 2015-12-15 DIAGNOSIS — E785 Hyperlipidemia, unspecified: Secondary | ICD-10-CM | POA: Diagnosis not present

## 2015-12-15 DIAGNOSIS — I10 Essential (primary) hypertension: Secondary | ICD-10-CM | POA: Diagnosis not present

## 2015-12-15 DIAGNOSIS — N4340 Spermatocele of epididymis, unspecified: Secondary | ICD-10-CM

## 2015-12-15 DIAGNOSIS — E669 Obesity, unspecified: Secondary | ICD-10-CM

## 2015-12-15 DIAGNOSIS — I252 Old myocardial infarction: Secondary | ICD-10-CM

## 2015-12-15 NOTE — Patient Instructions (Signed)
Your physician recommends that you return for lab work.  Your physician wants you to follow-up in: 1 year or sooner if needed. You will receive a reminder letter in the mail two months in advance. If you don't receive a letter, please call our office to schedule the follow-up appointment. 

## 2015-12-16 ENCOUNTER — Encounter: Payer: Self-pay | Admitting: Cardiovascular Disease

## 2015-12-16 DIAGNOSIS — E669 Obesity, unspecified: Secondary | ICD-10-CM | POA: Insufficient documentation

## 2015-12-16 NOTE — Progress Notes (Signed)
Patient ID: Jonathan [name], male   DOB: June 08, 1951, 65 y.o.   MRN: [id number]    Primary M.D.: Dr. Claris Gower  HPI: Jonathan [name] is a 65 y.o. male who presents to the office today for 108 month cardiology evaluation.   [name] has a history of coronary artery disease and on 01/11/2004 suffered an inferior wall myocardial infarction. Cardiac catheterization revealed total occlusion of the RCA and underwent successful intervention with ultimate insertion of a 3.0x33 mm and 3.0x13 mm drug-eluting Cipher stent with restoration of TIMI-3 flow. Additional problems include hyperlipidemia and ongoing tobacco use.  He admits to smoking at least one pack of cigarettes per day. He has a history of hypertension and has been on ramipril 10 mg in addition to Toprol-XL 25 mg.  He's been maintained on dual antiplatelet therapy with aspirin and Plavix.  He also takes Advair Diskus for his lung disease.  He has had intolerance to statins secondary to increased LFTs as well as CPK elevation. He had been on WelChol, fenofibrate, Zetia but apparently has not taking any lipid lowering therapy presently. His last nuclear perfusion study in March 2013 was unchanged from prior studies and did not show any evidence for scar or ischemia with only minimal residual basilar inferior thinning.  There was significant myocardial salvage from his successful reperfusion in the setting of his MI.Ejection fraction was 57%.  He sees Dr. Claris Gower, for primary care.  Laboratory by Dr. Arelia Sneddon in 07/15/2014 showed normal renal function with a BUN of 11,Cr0.98.  His lipids were markedly abnormal with a total cholesterol of 270, triglycerides of 368, VLDL of 74, and LDL cholesterol of 152.  HDL cholesterol was 44. Hemoglobin C was 5.6.  Since I last saw him in 2015, he denies any episodes of chest pain or awareness of arrhythmia.  He denies any episodes of presyncope or syncope He has not been active but does chores around the house.  He  retired in 2015, he does know mild shortness of breath, which he feels is due to his COPD.  He continues to take Advair.  He presents for evaluation.   Past Medical History  Diagnosis Date  . Myocardial infarction ([place])   . Heart attack ([place])   . Hypertension   . Shortness of breath     with exertion   . Arthritis     hands   . Chronic bronchitis [facility])     Past Surgical History  Procedure Laterality Date  . Heart stents  01/2004  . Cardiac catheterization    . Coronary angioplasty      stents- 01/2004   . Inguinal hernia repair Right 12/06/2012    Procedure: HERNIA REPAIR INGUINAL ADULT;  Surgeon: [name], DO;  Location: [facility];  Service: General;  Laterality: Right;  . Insertion of mesh Right 12/06/2012    Procedure: INSERTION OF MESH;  Surgeon: [name], DO;  Location: [facility];  Service: General;  Laterality: Right;  . Cardiovascular stress test  11/11/2011    Allergies  Allergen Reactions  . Niaspan [Niacin Er]     Current Outpatient Prescriptions  Medication Sig Dispense Refill  . albuterol (PROVENTIL HFA;VENTOLIN HFA) 108 (90 BASE) MCG/ACT inhaler Inhale 2 puffs into the lungs every 4 (four) hours as needed for wheezing or shortness of breath (cough, shortness of breath or wheezing.). 1 Inhaler 1  . aspirin 81 MG tablet Take 81 mg by mouth daily.    . clopidogrel (PLAVIX) 75 MG  tablet Take 1 tablet by mouth  daily 90 tablet 0  . Fluticasone-Salmeterol (ADVAIR DISKUS) 250-50 MCG/DOSE AEPB Inhale 1 puff into the lungs 2 (two) times daily. 60 each 12  . metoprolol succinate (TOPROL-XL) 25 MG 24 hr tablet Take 1 tablet by mouth  daily 90 tablet 0  . Omega-3 Fatty Acids (FISH OIL) 1000 MG CAPS Take 2 capsules (2,000 mg total) by mouth daily. 90 capsule 0  . ramipril (ALTACE) 10 MG capsule Take 1 capsule by mouth  daily 90 capsule 0   No current facility-administered medications for this visit.    Social History   Social History  . Marital Status: Married    Spouse  Name: N/A  . Number of Children: N/A  . Years of Education: N/A   Occupational History  . Not on file.   Social History Main Topics  . Smoking status: Current Every Day Smoker -- 1.00 packs/day for 30 years    Types: Cigarettes  . Smokeless tobacco: Never Used  . Alcohol Use: 2.5 oz/week    5 drink(s) per week     Comment: consumes 1 pint per week  . Drug Use: No  . Sexual Activity: Yes   Other Topics Concern  . Not on file   Social History Narrative   Additional social history is notable that he is married has 2 children. Has one grandchild. He works in Education officer, museum and remains busy. He still smoking. He does use occasional alcohol.  Family history is notable that his father died with heart disease at age 58.  ROS General: Negative; No fevers, chills, or night sweats;  HEENT: Negative; No changes in vision or hearing, sinus congestion, difficulty swallowing Pulmonary:  Mild shortness of breath, no hemoptysis Cardiovascular: Negative; No chest pain, presyncope, syncope, palpitations GI: Negative; No nausea, vomiting, diarrhea, or abdominal pain GU: Negative; No dysuria, hematuria, or difficulty voiding Musculoskeletal: Negative; no myalgias, joint pain, or weakness Hematologic/Oncology: Negative; no easy bruising, bleeding Endocrine: Negative; no heat/cold intolerance; no diabetes Neuro: Negative; no changes in balance, headaches Skin: Negative; No rashes or skin lesions Psychiatric: Negative; No behavioral problems, depression Sleep: Negative; No snoring, daytime sleepiness, hypersomnolence, bruxism, restless legs, hypnogognic hallucinations, no cataplexy Other comprehensive 14 point system review is negative.   PE BP 132/76 mmHg  Pulse 61  Ht 5\' 6"  (1.676 m)  Wt 213 lb 12.8 oz (96.979 kg)  BMI 34.52 kg/m2   Wt Readings from Last 3 Encounters:  12/15/15 213 lb 12.8 oz (96.979 kg)  08/05/14 207 lb 12.8 oz (94.257 kg)  07/25/13 209 lb 6.4 oz (94.983 kg)     General: Alert, oriented, no distress.  Skin: normal turgor, no rashes HEENT: Normocephalic, atraumatic. Pupils round and reactive; sclera anicteric;no lid lag.  Nose without nasal septal hypertrophy Mouth/Parynx benign; Mallinpatti scale 3  Neck: No JVD, no carotid bruits Lungs:  Diffusely decreased breath sounds.; no wheezing or rales  chest wall: Nontender to palpation Heart: RRR, s1 s2 normal 1/6 systolic murmur; .  No S3 gallop.  No iastolic murmur.  No rubs thrills or heaves Abdomen: soft, nontender; no hepatosplenomehaly, BS+; abdominal aorta nontender and not dilated by palpation.  back: No CVA tenderness Pulses 2+ Extremities: no clubbing cyanosis or edema, Homan's sign negative  Neurologic: grossly nonfocal Psychologic: normal affect and mood.  ECG (independently read by me): Normal sinus rhythm with an occasional PVC with retrograde P wave.  November 2015 ECG (independently read by me): Sinus bradycardia 52 bpm  Prior  ECG: Sinus rhythm at 61 beats per minute. No ectopy. Normal intervals.  LABS:  BMP Latest Ref Rng 11/28/2012  Glucose 70 - 99 mg/dL 90  BUN 6 - 23 mg/dL 15  Creatinine 0.50 - 1.35 mg/dL 1.02  Sodium 135 - 145 mEq/L 136  Potassium 3.5 - 5.1 mEq/L 5.1  Chloride 96 - 112 mEq/L 97  CO2 19 - 32 mEq/L 29  Calcium 8.4 - 10.5 mg/dL 9.5   No flowsheet data found. CBC Latest Ref Rng 11/28/2012  WBC 4.0 - 10.5 K/uL 6.9  Hemoglobin 13.0 - 17.0 g/dL 15.9  Hematocrit 39.0 - 52.0 % 47.5  Platelets 150 - 400 K/uL 241    Lab Results  Component Value Date   MCV 93.3 11/28/2012   No results found for: TSH   Lipid Panel  No results found for: CHOL, TRIG, HDL, CHOLHDL, VLDL, LDLCALC, LDLDIRECT  RADIOLOGY: No results found.   ASSESSMENT AND PLAN: Jonathan [name] is a 65 year old gentleman who is 12 years since his inferior wall myocardial infarction which time he was taken acutely to the catheterization laboratory and underwent successful intervention to  a totally occluded mid RCA  Insertion of tandem drug-eluting Cypher stent insertion.  He had concomitant CAD involving his LAD diagonal vessel. He developed CPK and LFT elevation with statin therapy  also stopped taking all of her lipid-lowering treatment.  Unfortunately he continues to smoke cigarettes. He does not routinely exercise and admits to not being very active except doing [place] chores.  He continues to be on dual antiplatelet therapy for CAD and denies associated breathing.  He is on ram a pill 10 mg and Toprol-XL 25 mg for hypertension.  Blood pressure today is controlled.  He has COPD/asthma and continues to be on albuterol in addition to Advair.  He has not had recent blood work.  I have recommended a complete set of laboratory be obtained.  His body mass index is 34.5, compatible with obesity.  We discussed the importance of exercise at least 5 days per week for a minimum of 30 minutes.  Stressed importance of weight reduction in smoking cessation.  Clinically, he's not having any anginal symptoms.  I will contact him regarding his blood work results and adjustments to his medical therapy will be done if necessary.  In the past.  I had also discussed the possibility of PCS canine inhibition since he was intolerant to multiple lipid lowering therapies and he was not interested.  I will see him in one year for cardiology evaluation.   Time spent: 25 minutes   [name], MD, [facility]  12/16/2015 8:09 AM

## 2015-12-21 ENCOUNTER — Other Ambulatory Visit: Payer: Self-pay | Admitting: Cardiovascular Disease

## 2015-12-21 NOTE — Telephone Encounter (Signed)
REFILL 

## 2016-01-06 ENCOUNTER — Other Ambulatory Visit: Payer: Self-pay | Admitting: Cardiovascular Disease

## 2016-01-06 NOTE — Telephone Encounter (Signed)
Rx refill sent to pharmacy. 

## 2016-01-08 ENCOUNTER — Other Ambulatory Visit: Payer: Self-pay | Admitting: Cardiovascular Disease

## 2016-02-01 ENCOUNTER — Other Ambulatory Visit: Payer: Self-pay | Admitting: Cardiovascular Disease

## 2016-02-02 NOTE — Telephone Encounter (Signed)
Rx(s) sent to pharmacy electronically.  

## 2016-09-20 ENCOUNTER — Other Ambulatory Visit: Payer: Self-pay | Admitting: Cardiovascular Disease

## 2016-09-20 NOTE — Telephone Encounter (Signed)
Rx(s) sent to pharmacy electronically.  

## 2016-12-13 ENCOUNTER — Ambulatory Visit (INDEPENDENT_AMBULATORY_CARE_PROVIDER_SITE_OTHER): Payer: Medicare Other | Admitting: Cardiovascular Disease

## 2016-12-13 ENCOUNTER — Encounter: Payer: Self-pay | Admitting: Cardiovascular Disease

## 2016-12-13 VITALS — BP 112/66 | HR 60 | Ht 68.0 in | Wt 218.0 lb

## 2016-12-13 DIAGNOSIS — I10 Essential (primary) hypertension: Secondary | ICD-10-CM

## 2016-12-13 DIAGNOSIS — E669 Obesity, unspecified: Secondary | ICD-10-CM

## 2016-12-13 DIAGNOSIS — J449 Chronic obstructive pulmonary disease, unspecified: Secondary | ICD-10-CM | POA: Diagnosis not present

## 2016-12-13 DIAGNOSIS — Z79899 Other long term (current) drug therapy: Secondary | ICD-10-CM

## 2016-12-13 DIAGNOSIS — E785 Hyperlipidemia, unspecified: Secondary | ICD-10-CM

## 2016-12-13 DIAGNOSIS — Z789 Other specified health status: Secondary | ICD-10-CM

## 2016-12-13 DIAGNOSIS — Z716 Tobacco abuse counseling: Secondary | ICD-10-CM

## 2016-12-13 NOTE — Patient Instructions (Signed)
Your physician recommends that you return for lab work FASTING.   Your physician wants you to follow-up in: 6 months or sooner if needed. You will receive a reminder letter in the mail two months in advance. If you don't receive a letter, please call our office to schedule the follow-up appointment.   If you need a refill on your cardiac medications before your next appointment, please call your pharmacy.    

## 2016-12-13 NOTE — Progress Notes (Signed)
Patient ID: Jonathan [name], male   DOB: November 16, 1950, 66 y.o.   MRN: [id number]    Primary M.D.: Dr. Claris Gower  HPI: Jonathan [name] is a 66 y.o. male who presents to the office today for a 71 month cardiology evaluation.   Jonathan [name] has a history of coronary artery disease and on 01/11/2004 suffered an inferior wall myocardial infarction. Cardiac catheterization revealed total occlusion of the RCA and underwent successful intervention with ultimate insertion of a 3.0x33 mm and 3.0x13 mm drug-eluting Cipher stent with restoration of TIMI-3 flow. Additional problems include hyperlipidemia and ongoing tobacco use.  He admits to smoking at least one pack of cigarettes per day. He has a history of hypertension and has been on ramipril 10 mg in addition to Toprol-XL 25 mg.  He's been maintained on dual antiplatelet therapy with aspirin and Plavix.  He also takes Advair Diskus for his lung disease.  He has had intolerance to statins secondary to increased LFTs as well as CPK elevation. He had been on WelChol, fenofibrate, Zetia but apparently has not taking any lipid lowering therapy presently. His last nuclear perfusion study in March 2013 was unchanged from prior studies and did not show any evidence for scar or ischemia with only minimal residual basilar inferior thinning.  There was significant myocardial salvage from his successful reperfusion in the setting of his MI.Ejection fraction was 57%.  He sees Dr. Claris Gower, for primary care.  Laboratory by Dr. Arelia Sneddon in 07/15/2014 showed normal renal function with a BUN of 11,Cr0.98.  His lipids were markedly abnormal with a total cholesterol of 270, triglycerides of 368, VLDL of 74, and LDL cholesterol of 152.  HDL cholesterol was 44. Hemoglobin C was 5.6.  Since I last saw him one year ago, he denies any episodes of chest pain or awareness of arrhythmia.  He denies any episodes of presyncope or syncope He has not been active but does chores around the  house.  He continues to smoke cigarettes and has been smoking since age 65.  He retired in 2015, he does know mild shortness of breath, which he feels is due to his COPD.  He continues to take Advair.  He presents for evaluation.   Past Medical History:  Diagnosis Date  . Arthritis    hands   . Chronic bronchitis ([place])   . Heart attack   . Hypertension   . Myocardial infarction   . Shortness of breath    with exertion     Past Surgical History:  Procedure Laterality Date  . CARDIAC CATHETERIZATION    . CARDIOVASCULAR STRESS TEST  11/11/2011  . CORONARY ANGIOPLASTY     stents- 01/2004   . heart stents  01/2004  . INGUINAL HERNIA REPAIR Right 12/06/2012   Procedure: HERNIA REPAIR INGUINAL ADULT;  Surgeon: [name], DO;  Location: [facility];  Service: General;  Laterality: Right;  . INSERTION OF MESH Right 12/06/2012   Procedure: INSERTION OF MESH;  Surgeon: [name], DO;  Location: [facility];  Service: General;  Laterality: Right;    Allergies  Allergen Reactions  . Niaspan [Niacin Er]     Current Outpatient Prescriptions  Medication Sig Dispense Refill  . albuterol (PROVENTIL HFA;VENTOLIN HFA) 108 (90 BASE) MCG/ACT inhaler Inhale 2 puffs into the lungs every 4 (four) hours as needed for wheezing or shortness of breath (cough, shortness of breath or wheezing.). 1 Inhaler 1  . aspirin 81 MG tablet Take 81 mg by mouth daily.    [name]  clopidogrel (PLAVIX) 75 MG tablet TAKE 1 TABLET BY MOUTH  DAILY 90 tablet 0  . Fluticasone-Salmeterol (ADVAIR DISKUS) 250-50 MCG/DOSE AEPB Inhale 1 puff into the lungs 2 (two) times daily. 60 each 12  . metoprolol succinate (TOPROL-XL) 25 MG 24 hr tablet TAKE 1 TABLET BY MOUTH  DAILY 90 tablet 0  . Omega-3 Fatty Acids (FISH OIL) 1000 MG CAPS Take 2 capsules (2,000 mg total) by mouth daily. 90 capsule 0  . ramipril (ALTACE) 10 MG capsule TAKE 1 CAPSULE BY MOUTH  DAILY 90 capsule 0   No current facility-administered medications for this visit.     Social  History   Social History  . Marital status: Married    Spouse name: N/A  . Number of children: N/A  . Years of education: N/A   Occupational History  . Not on file.   Social History Main Topics  . Smoking status: Current Every Day Smoker    Packs/day: 1.00    Years: 30.00    Types: Cigarettes  . Smokeless tobacco: Never Used  . Alcohol use 2.5 oz/week    5 drink(s) per week     Comment: consumes 1 pint per week  . Drug use: No  . Sexual activity: Yes   Other Topics Concern  . Not on file   Social History Narrative  . No narrative on file   Additional social history is notable that he is married has 2 children. Has one grandchild. He works in Education officer, museum and remains busy. He still smoking. He does use occasional alcohol.  Family history is notable that his father died with heart disease at age 46.  ROS General: Negative; No fevers, chills, or night sweats;  HEENT: Negative; No changes in vision or hearing, sinus congestion, difficulty swallowing Pulmonary:  Mild shortness of breath, no hemoptysis Cardiovascular: Negative; No chest pain, presyncope, syncope, palpitations GI: Negative; No nausea, vomiting, diarrhea, or abdominal pain GU: Negative; No dysuria, hematuria, or difficulty voiding Musculoskeletal: Negative; no myalgias, joint pain, or weakness Hematologic/Oncology: Negative; no easy bruising, bleeding Endocrine: Negative; no heat/cold intolerance; no diabetes Neuro: Negative; no changes in balance, headaches Skin: Negative; No rashes or skin lesions Psychiatric: Negative; No behavioral problems, depression Sleep: Negative; No snoring, daytime sleepiness, hypersomnolence, bruxism, restless legs, hypnogognic hallucinations, no cataplexy Other comprehensive 14 point system review is negative.   PE BP 112/66   Pulse 60   Ht 5\' 8"  (1.727 m)   Wt 218 lb (98.9 kg)   BMI 33.15 kg/m    Repeat blood pressure by me 132/70 supine and 122/70  standing.  Wt Readings from Last 3 Encounters:  12/13/16 218 lb (98.9 kg)  12/15/15 213 lb 12.8 oz (97 kg)  08/05/14 207 lb 12.8 oz (94.3 kg)   General: Alert, oriented, no distress.  Skin: normal turgor, no rashes HEENT: Normocephalic, atraumatic. Pupils round and reactive; sclera anicteric;no lid lag.  Nose without nasal septal hypertrophy Mouth/Parynx benign; Mallinpatti scale 3  Neck: No JVD, no carotid bruits Lungs:  Diffusely decreased breath sounds, faint end expiratory wheezing  chest wall: Nontender to palpation Heart: RRR, s1 s2 normal 1/6 systolic murmur; .  No S3 gallop.  No iastolic murmur.  No rubs thrills or heaves Abdomen: soft, nontender; no hepatosplenomehaly, BS+; abdominal aorta nontender and not dilated by palpation.  back: No CVA tenderness Pulses 2+ Extremities: no clubbing cyanosis or edema, Homan's sign negative  Neurologic: grossly nonfocal Psychologic: normal affect and mood.  ECG (independently read by me):  Normal sinus rhythm at 61 bpm.  No ectopy.  Normal intervals.  April 2017 ECG (independently read by me): Normal sinus rhythm with an occasional PVC with retrograde P wave.  November 2015 ECG (independently read by me): Sinus bradycardia 52 bpm  Prior ECG: Sinus rhythm at 61 beats per minute. No ectopy. Normal intervals.  LABS:  BMP Latest Ref Rng & Units 11/28/2012  Glucose 70 - 99 mg/dL 90  BUN 6 - 23 mg/dL 15  Creatinine 0.50 - 1.35 mg/dL 1.02  Sodium 135 - 145 mEq/L 136  Potassium 3.5 - 5.1 mEq/L 5.1  Chloride 96 - 112 mEq/L 97  CO2 19 - 32 mEq/L 29  Calcium 8.4 - 10.5 mg/dL 9.5   No flowsheet data found. CBC Latest Ref Rng & Units 11/28/2012  WBC 4.0 - 10.5 K/uL 6.9  Hemoglobin 13.0 - 17.0 g/dL 15.9  Hematocrit 39.0 - 52.0 % 47.5  Platelets 150 - 400 K/uL 241    Lab Results  Component Value Date   MCV 93.3 11/28/2012   No results found for: TSH   Lipid Panel  No results found for: CHOL, TRIG, HDL, CHOLHDL, VLDL, LDLCALC,  LDLDIRECT  RADIOLOGY: No results found.  IMPRESSION:  1. Essential hypertension, benign   2. Hyperlipidemia LDL goal <70   3. Medication management   4. Obesity (BMI 30.0-34.9)   5. Statin intolerance   6. Tobacco abuse counseling   7. Chronic obstructive pulmonary disease, unspecified COPD type ([place])      ASSESSMENT AND PLAN: Jonathan [name] is a 66 year old gentleman who is 13 years since his inferior wall myocardial infarction and emergent catheterization and underwent successful intervention to a totally occluded mid RCA with insertion of tandem drug-eluting Cypher stents.  He had concomitant CAD involving his LAD diagonal vessel. He developed CPK and LFT elevation with statin therapy  also stopped taking all of her lipid-lowering treatment.  Unfortunately he continues to smoke cigarettes. He does not routinely exercise and admits to not being very active except doing house chores.  He continues to be on dual antiplatelet therapy for CAD and denies associated breathing.  He is on ramaprol 10 mg and Toprol-XL 25 mg for hypertension.  Blood pressure today is controlled.  He has COPD/asthma and continues to be on albuterol in addition to Advair.  He continues to be on dual antiplatelet therapy.  He has not had recent laboratory.  A complete set of blood work will be obtained in the fasting state.  Remotely, he developed stent failure with increased LFTs and CPK elevation and I tried WelChol, fenofibrate, and Zetia.  I reviewed the current dated with him regarding Repatha.  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  Once his blood work is available, we discussed starting the approval process to see if he can take PCSK9 inhibition therapy through his insurance company.  I again had a lengthy discussion with him regarding smoking cessation and try to profound counsel.  He is no longer is smoking in the house so that his grandchildren can have "clean air. " I will see him in 37 month for reevaluation.    Time spent: 25 minutes   [name], MD, [facility]  12/13/2016 4:56 PM

## 2017-02-20 ENCOUNTER — Other Ambulatory Visit: Payer: Self-pay | Admitting: Cardiovascular Disease

## 2017-08-24 ENCOUNTER — Other Ambulatory Visit: Payer: Self-pay | Admitting: Cardiovascular Disease

## 2017-08-24 NOTE — Telephone Encounter (Signed)
REFILL 

## 2017-09-25 ENCOUNTER — Ambulatory Visit: Payer: Medicare Other | Admitting: Cardiovascular Disease

## 2017-09-25 ENCOUNTER — Encounter: Payer: Self-pay | Admitting: Cardiovascular Disease

## 2017-09-25 VITALS — BP 130/75 | HR 59 | Ht 68.0 in | Wt 210.6 lb

## 2017-09-25 DIAGNOSIS — I2510 Atherosclerotic heart disease of native coronary artery without angina pectoris: Secondary | ICD-10-CM | POA: Diagnosis not present

## 2017-09-25 DIAGNOSIS — I252 Old myocardial infarction: Secondary | ICD-10-CM | POA: Diagnosis not present

## 2017-09-25 DIAGNOSIS — Z789 Other specified health status: Secondary | ICD-10-CM

## 2017-09-25 DIAGNOSIS — J449 Chronic obstructive pulmonary disease, unspecified: Secondary | ICD-10-CM | POA: Diagnosis not present

## 2017-09-25 DIAGNOSIS — I10 Essential (primary) hypertension: Secondary | ICD-10-CM

## 2017-09-25 DIAGNOSIS — Z716 Tobacco abuse counseling: Secondary | ICD-10-CM | POA: Diagnosis not present

## 2017-09-25 DIAGNOSIS — E785 Hyperlipidemia, unspecified: Secondary | ICD-10-CM | POA: Diagnosis not present

## 2017-09-25 MED ORDER — OMEGA-3-ACID ETHYL ESTERS 1 G PO CAPS: 1.0000 g | ORAL_CAPSULE | Freq: Two times a day (BID) | ORAL | 3 refills | Status: DC

## 2017-09-25 NOTE — Patient Instructions (Addendum)
Medication Instructions:  Your physician has recommended you make the following change in your medication:  1) START Lovaza 1g capsule - Take ONE capsule by mouth TWICE daily  2) START Repatha Injections - medication information listed below and PharmD will be in contact with you to discuss next steps   Labwork: Your physician recommends that you return for lab work in: none   Testing/Procedures: none  Follow-Up: Your physician recommends that you schedule a follow-up appointment in: 6 months with Dr. Claiborne Billings.   Any Other Special Instructions Will Be Listed Below (If Applicable).   Evolocumab injection What is this medicine? EVOLOCUMAB (e voe LOK ue mab) is known as a PCSK9 inhibitor. It is used to lower the level of cholesterol in the blood. It may be used alone or in combination with other cholesterol-lowering drugs. This drug may also be used to reduce the risk of heart attack, stroke, and certain types of heart surgery in patients with heart disease. This medicine may be used for other purposes; ask your health care provider or pharmacist if you have questions. COMMON BRAND NAME(S): REPATHA What should I tell my health care provider before I take this medicine? They need to know if you have any of these conditions: -an unusual or allergic reaction to evolocumab, other medicines, foods, dyes, or preservatives -pregnant or trying to get pregnant -breast-feeding How should I use this medicine? This medicine is for injection under the skin. You will be taught how to prepare and give this medicine. Use exactly as directed. Take your medicine at regular intervals. Do not take your medicine more often than directed. It is important that you put your used needles and syringes in a special sharps container. Do not put them in a trash can. If you do not have a sharps container, call your pharmacist or health care provider to get one. Talk to your pediatrician regarding the use of this  medicine in children. While this drug may be prescribed for children as young as 13 years for selected conditions, precautions do apply. Overdosage: If you think you have taken too much of this medicine contact a poison control center or emergency room at once. NOTE: This medicine is only for you. Do not share this medicine with others. What if I miss a dose? If you miss a dose, take it as soon as you can if there are more than 7 days until the next scheduled dose, or skip the missed dose and take the next dose according to your original schedule. Do not take double or extra doses. What may interact with this medicine? Interactions are not expected. This list may not describe all possible interactions. Give your health care provider a list of all the medicines, herbs, non-prescription drugs, or dietary supplements you use. Also tell them if you smoke, drink alcohol, or use illegal drugs. Some items may interact with your medicine. What should I watch for while using this medicine? You may need blood work while you are taking this medicine. What side effects may I notice from receiving this medicine? Side effects that you should report to your doctor or health care professional as soon as possible: -allergic reactions like skin rash, itching or hives, swelling of the face, lips, or tongue -signs and symptoms of infection like fever or chills; cough; sore throat; pain or trouble passing urine Side effects that usually do not require medical attention (report to your doctor or health care professional if they continue or are bothersome): -diarrhea -nausea -  muscle pain -pain, redness, or irritation at site where injected This list may not describe all possible side effects. Call your doctor for medical advice about side effects. You may report side effects to FDA at 1-800-FDA-1088. Where should I keep my medicine? Keep out of the reach of children. You will be instructed on how to store this  medicine. Throw away any unused medicine after the expiration date on the label. NOTE: This sheet is a summary. It may not cover all possible information. If you have questions about this medicine, talk to your doctor, pharmacist, or health care provider.  2018 Elsevier/Gold Standard (2016-08-15 13:21:53)   If you need a refill on your cardiac medications before your next appointment, please call your pharmacy.

## 2017-09-25 NOTE — Progress Notes (Signed)
Patient ID: [name], male   DOB: [birth year], 67 y.o.   MRN: [id number]    Primary M.D.: Dr. Claris Gower  HPI: IZAYAH MINER is a 67 y.o. male who presents to the office today for an 8 month cardiology evaluation.   Mr. Teryl Lucy has a history of coronary artery disease and on 01/11/2004 suffered an inferior wall myocardial infarction. Cardiac catheterization revealed total occlusion of the RCA and underwent successful intervention with ultimate insertion of a 3.0x33 mm and 3.0x13 mm drug-eluting Cipher stent with restoration of TIMI-3 flow. Additional problems include hyperlipidemia and ongoing tobacco use.  He admits to smoking at least one pack of cigarettes per day. He has a history of hypertension and has been on ramipril 10 mg in addition to Toprol-XL 25 mg.  He's been maintained on dual antiplatelet therapy with aspirin and Plavix.  He also takes Advair Diskus for his lung disease.  He has had intolerance to statins secondary to increased LFTs as well as CPK elevation. He had been on WelChol, fenofibrate, Zetia but apparently has not taking any lipid lowering therapy presently. His last nuclear perfusion study in March 2013 was unchanged from prior studies and did not show any evidence for scar or ischemia with only minimal residual basilar inferior thinning.  There was significant myocardial salvage from his successful reperfusion in the setting of his MI.Ejection fraction was 57%.  He sees Dr. Claris Gower, for primary care.  Laboratory by Dr. Arelia Sneddon in 07/15/2014 showed normal renal function with a BUN of 11,Cr0.98.  His lipids were markedly abnormal with a total cholesterol of 270, triglycerides of 368, VLDL of 74, and LDL cholesterol of 152.  HDL cholesterol was 44. Hemoglobin C was 5.6.  When I last saw him, he denied any episodes of chest pain or palpitations.  Unfortunately, he was continuing to smoke and had started smoking at age 25.  He has COPD.  Since I last saw him,  unfortunately he continues to smoke.  He has no interest in quitting.  He has been tried on multiple lipid-lowering agents develop increased LFTs and CPK elevation on statins.  In addition, I've tried WelChol, fenofibrate, and Zetia.  I last saw him, I discussed the possibility of Repatha.  He had undergone repeat laboratory in July 2018, which continue to show a total cholesterol 238, triglycerides 308, HDL 47, VLDL 62, and LDL cholesterol at 129.  He presents for evaluation.  Past Medical History:  Diagnosis Date  . Arthritis    hands   . Chronic bronchitis ([place])   . Heart attack ([place])   . Hypertension   . Myocardial infarction ([place])   . Shortness of breath    with exertion     Past Surgical History:  Procedure Laterality Date  . CARDIAC CATHETERIZATION    . CARDIOVASCULAR STRESS TEST  11/11/2011  . CORONARY ANGIOPLASTY     stents- 01/2004   . heart stents  01/2004  . INGUINAL HERNIA REPAIR Right 12/06/2012   Procedure: HERNIA REPAIR INGUINAL ADULT;  Surgeon: [name], DO;  Location: [facility];  Service: General;  Laterality: Right;  . INSERTION OF MESH Right 12/06/2012   Procedure: INSERTION OF MESH;  Surgeon: [name], DO;  Location: [facility];  Service: General;  Laterality: Right;    Allergies  Allergen Reactions  . Niaspan [Niacin Er]     Current Outpatient Medications  Medication Sig Dispense Refill  . albuterol (PROVENTIL HFA;VENTOLIN HFA) 108 (90 BASE) MCG/ACT inhaler Inhale  2 puffs into the lungs every 4 (four) hours as needed for wheezing or shortness of breath (cough, shortness of breath or wheezing.). 1 Inhaler 1  . aspirin 81 MG tablet Take 81 mg by mouth daily.    . clopidogrel (PLAVIX) 75 MG tablet TAKE 1 TABLET BY MOUTH  DAILY 90 tablet 1  . Fluticasone-Salmeterol (ADVAIR DISKUS) 250-50 MCG/DOSE AEPB Inhale 1 puff into the lungs 2 (two) times daily. 60 each 12  . metoprolol succinate (TOPROL-XL) 25 MG 24 hr tablet TAKE 1 TABLET BY MOUTH  DAILY 90 tablet 1  .  Omega-3 Fatty Acids (FISH OIL) 1000 MG CAPS Take 2 capsules (2,000 mg total) by mouth daily. 90 capsule 0  . ramipril (ALTACE) 10 MG capsule TAKE 1 CAPSULE BY MOUTH  DAILY 90 capsule 1  . omega-3 acid ethyl esters (LOVAZA) 1 g capsule Take 1 capsule (1 g total) by mouth 2 (two) times daily. 180 capsule 3   No current facility-administered medications for this visit.     Social History   Socioeconomic History  . Marital status: Married    Spouse name: Not on file  . Number of children: Not on file  . Years of education: Not on file  . Highest education level: Not on file  Social Needs  . Financial resource strain: Not on file  . Food insecurity - worry: Not on file  . Food insecurity - inability: Not on file  . Transportation needs - medical: Not on file  . Transportation needs - non-medical: Not on file  Occupational History  . Not on file  Tobacco Use  . Smoking status: Current Every Day Smoker    Packs/day: 1.00    Years: 30.00    Pack years: 30.00    Types: Cigarettes  . Smokeless tobacco: Never Used  Substance and Sexual Activity  . Alcohol use: Yes    Alcohol/week: 2.5 oz    Types: 5 drink(s) per week    Comment: consumes 1 pint per week  . Drug use: No  . Sexual activity: Yes  Other Topics Concern  . Not on file  Social History Narrative  . Not on file   Additional social history is notable that he is married has 2 children. Has one grandchild. He works in Education officer, [employer] and remains busy. He still smoking. He does use occasional alcohol.  Family history is notable that his father died with heart disease at age 55.  ROS General: Negative; No fevers, chills, or night sweats;  HEENT: Negative; No changes in vision or hearing, sinus congestion, difficulty swallowing Pulmonary:  Mild shortness of breath, no hemoptysis Cardiovascular: Negative; No chest pain, presyncope, syncope, palpitations GI: Negative; No nausea, vomiting, diarrhea, or abdominal  pain GU: Negative; No dysuria, hematuria, or difficulty voiding Musculoskeletal: Negative; no myalgias, joint pain, or weakness Hematologic/Oncology: Negative; no easy bruising, bleeding Endocrine: Negative; no heat/cold intolerance; no diabetes Neuro: Negative; no changes in balance, headaches Skin: Negative; No rashes or skin lesions Psychiatric: Negative; No behavioral problems, depression Sleep: Negative; No snoring, daytime sleepiness, hypersomnolence, bruxism, restless legs, hypnogognic hallucinations, no cataplexy Other comprehensive 14 point system review is negative.   PE BP 130/75   Pulse (!) 59   Ht 5\' 8"  (1.727 m)   Wt 210 lb 9.6 oz (95.5 kg)   BMI 32.02 kg/m    Repeat blood pressure by me was 114/72.  Wt Readings from Last 3 Encounters:  09/25/17 210 lb 9.6 oz (95.5 kg)  12/13/16 218  lb (98.9 kg)  12/15/15 213 lb 12.8 oz (97 kg)    Physical Exam BP 130/75   Pulse (!) 59   Ht 5\' 8"  (1.727 m)   Wt 210 lb 9.6 oz (95.5 kg)   BMI 32.02 kg/m  General: Alert, oriented, no distress.  Skin: normal turgor, no rashes, warm and dry HEENT: Normocephalic, atraumatic. Pupils equal round and reactive to light; sclera anicteric; extraocular muscles intact;  Nose without nasal septal hypertrophy Mouth/Parynx benign; Mallinpatti scale 3 Neck: No JVD, no carotid bruits; normal carotid upstroke Lungs: Diffusely decreased breath sounds.  No wheezing. Chest wall: without tenderness to palpitation Heart: PMI not displaced, RRR, s1 s2 normal, 1/6 systolic murmur, no diastolic murmur, no rubs, gallops, thrills, or heaves Abdomen: soft, nontender; no hepatosplenomehaly, BS+; abdominal aorta nontender and not dilated by palpation. Back: no CVA tenderness Pulses 2+ Musculoskeletal: full range of motion, normal strength, no joint deformities Extremities: no clubbing cyanosis or edema, Homan's sign negative  Neurologic: grossly nonfocal; Cranial nerves grossly wnl Psychologic: Normal  mood and affect   ECG (independently read by me): Sinus bradycardia 59 bpm.  No ectopy.  Normal intervals.  April 2018 ECG (independently read by me): Normal sinus rhythm at 61 bpm.  No ectopy.  Normal intervals.  April 2017 ECG (independently read by me): Normal sinus rhythm with an occasional PVC with retrograde P wave.  November 2015 ECG (independently read by me): Sinus bradycardia 52 bpm  Prior ECG: Sinus rhythm at 61 beats per minute. No ectopy. Normal intervals.  LABS:  BMP Latest Ref Rng & Units 11/28/2012  Glucose 70 - 99 mg/dL 90  BUN 6 - 23 mg/dL 15  Creatinine 0.50 - 1.35 mg/dL 1.02  Sodium 135 - 145 mEq/L 136  Potassium 3.5 - 5.1 mEq/L 5.1  Chloride 96 - 112 mEq/L 97  CO2 19 - 32 mEq/L 29  Calcium 8.4 - 10.5 mg/dL 9.5   No flowsheet data found. CBC Latest Ref Rng & Units 11/28/2012  WBC 4.0 - 10.5 K/uL 6.9  Hemoglobin 13.0 - 17.0 g/dL 15.9  Hematocrit 39.0 - 52.0 % 47.5  Platelets 150 - 400 K/uL 241    Lab Results  Component Value Date   MCV 93.3 11/28/2012   No results found for: TSH   Lipid Panel  No results found for: CHOL, TRIG, HDL, CHOLHDL, VLDL, LDLCALC, LDLDIRECT  RADIOLOGY: No results found.  IMPRESSION:  1. Essential hypertension, benign   2. CAD in native artery   3. Old myocardial infarction   4. Hyperlipidemia LDL goal <70   5. Statin intolerance   6. Tobacco abuse counseling   7. Chronic obstructive pulmonary disease, unspecified COPD type ([place])      ASSESSMENT AND PLAN: Mr. Jaythen Hamme is a 67 year old gentleman who is 13 1/2 years since his inferior wall myocardial infarction and emergent catheterization and underwent successful intervention to a totally occluded mid RCA with insertion of tandem drug-eluting Cypher stents.  He had concomitant CAD involving his LAD diagonal vessel. He developed CPK and LFT elevation with statin therapy  also stopped taking all of her lipid-lowering treatment.  Unfortunately he continues to smoke  cigarettes. .  He does not routinely exercise but is active doing household chores.  He has been continued on dual antiplatelet therapy with aspirin and Plavix.  He had been on fish oil but had stopped taking this.  His blood pressure today is controlled on ramipril 10 mg and Toprol-XL 25 mg daily.  He takes  albuterol as needed.  I again had a long discussion with him concerning the importance of smoking cessation.  He does not have any interest in quitting.  I also had a long discussion regarding PCS canine inhibition and he seems interested in initiating treatment.  I have contacted [facility]  [employer] who also spoke with him after my office visit to initiate the hole full approval process.  I discussed with him the significant dose reduction recently in [place] and as result, the Medicare copayment is significantly less than it would've been last year.  I will see him in 6 months for reevaluation or sooner as needed.   Time spent: 25 minutes [name], MD, [facility]  09/26/2017 6:26 PM

## 2017-09-26 ENCOUNTER — Encounter: Payer: Self-pay | Admitting: Cardiovascular Disease

## 2017-10-12 ENCOUNTER — Ambulatory Visit: Payer: Self-pay | Admitting: Physician Assistant

## 2017-10-20 ENCOUNTER — Telehealth: Payer: Self-pay | Admitting: Pharmacist Clinician (PhC)/ Clinical Pharmacy Specialist

## 2017-10-20 NOTE — Telephone Encounter (Signed)
LMOM for patient to call back - need to get lipid labs for application for Repatha

## 2017-10-24 NOTE — Telephone Encounter (Signed)
Spoke with patient, he will get labs drawn in next week

## 2017-11-02 ENCOUNTER — Ambulatory Visit: Payer: Medicare Other | Admitting: Physician Assistant

## 2017-11-02 ENCOUNTER — Ambulatory Visit (INDEPENDENT_AMBULATORY_CARE_PROVIDER_SITE_OTHER): Payer: Medicare Other

## 2017-11-02 ENCOUNTER — Other Ambulatory Visit: Payer: Self-pay

## 2017-11-02 ENCOUNTER — Encounter: Payer: Self-pay | Admitting: Physician Assistant

## 2017-11-02 VITALS — BP 134/72 | HR 60 | Temp 97.5°F | Resp 16 | Ht 68.0 in | Wt 209.2 lb

## 2017-11-02 DIAGNOSIS — M79674 Pain in right toe(s): Secondary | ICD-10-CM

## 2017-11-02 MED ORDER — COLCHICINE 0.6 MG PO TABS: 0.6000 mg | ORAL_TABLET | Freq: Every day | ORAL | 0 refills | Status: DC

## 2017-11-02 MED ORDER — DOXYCYCLINE HYCLATE 100 MG PO CAPS: 100.0000 mg | ORAL_CAPSULE | Freq: Two times a day (BID) | ORAL | 0 refills | Status: AC

## 2017-11-02 NOTE — Progress Notes (Signed)
PRIMARY CARE AT [place], [place] [address] [phone]  Date:  11/02/2017   Name:  Jonathan [name]   DOB:  [birth year]   MRN:  [id number]  PCP:  Leonard Downing, MD    History of Present Illness:  Jonathan [name] is a 67 y.o. male patient who presents to PCP with  Chief Complaint  Patient presents with  . Wound Infection   Patient reports that there was a streak along the top of his great toe of right foot.  This was slightly painful.  He has taken keflex twice per day .  This did not improve his pain, but now the toe is red across it.  He has not had red streaking or fever.     Patient Active Problem List   Diagnosis Date Noted  . Obesity (BMI 30.0-34.9) 12/16/2015  . Tobacco abuse counseling 08/05/2014  . 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 Essential hypertension, benign 09/23/2012  . Heart attack ([place]) 09/23/2012  . Hyperlipidemia 09/23/2012  . Hernia, inguinal 09/23/2012    Past Medical History:  Diagnosis Date  . Arthritis    hands   . Chronic bronchitis ([place])   . Heart attack ([place])   . Hypertension   . Myocardial infarction ([place])   . Shortness of breath    with exertion     Past Surgical History:  Procedure Laterality Date  . CARDIAC CATHETERIZATION    . CARDIOVASCULAR STRESS TEST  11/11/2011  . CORONARY ANGIOPLASTY     stents- 01/2004   . heart stents  01/2004  . INGUINAL HERNIA REPAIR Right 12/06/2012   Procedure: HERNIA REPAIR INGUINAL ADULT;  Surgeon: [name], DO;  Location: [facility];  Service: General;  Laterality: Right;  . INSERTION OF MESH Right 12/06/2012   Procedure: INSERTION OF MESH;  Surgeon: [name], DO;  Location: [facility];  Service: General;  Laterality: Right;    Social History   Tobacco Use  . Smoking status: Current Every Day Smoker    Packs/day: 1.00    Years: 30.00    Pack years: 30.00    Types: Cigarettes  . Smokeless tobacco: Never Used  Substance Use Topics  . Alcohol use: Yes    Alcohol/week: 2.5  oz    Types: 5 Standard drinks or equivalent per week    Comment: consumes 1 pint per week  . Drug use: No    History reviewed. No pertinent family history.  Allergies  Allergen Reactions  . Niaspan [Niacin Er]     Medication list has been reviewed and updated.  Current Outpatient Medications on File Prior to Visit  Medication Sig Dispense Refill  . albuterol (PROVENTIL HFA;VENTOLIN HFA) 108 (90 BASE) MCG/ACT inhaler Inhale 2 puffs into the lungs every 4 (four) hours as needed for wheezing or shortness of breath (cough, shortness of breath or wheezing.). 1 Inhaler 1  . aspirin 81 MG tablet Take 81 mg by mouth daily.    . clopidogrel (PLAVIX) 75 MG tablet TAKE 1 TABLET BY MOUTH  DAILY 90 tablet 1  . Fluticasone-Salmeterol (ADVAIR DISKUS) 250-50 MCG/DOSE AEPB Inhale 1 puff into the lungs 2 (two) times daily. 60 each 12  . metoprolol succinate (TOPROL-XL) 25 MG 24 hr tablet TAKE 1 TABLET BY MOUTH  DAILY 90 tablet 1  . omega-3 acid ethyl esters (LOVAZA) 1 g capsule Take 1 capsule (1 g total) by mouth 2 (two) times daily. 180 capsule 3  . Omega-3 Fatty Acids (FISH OIL)  1000 MG CAPS Take 2 capsules (2,000 mg total) by mouth daily. 90 capsule 0  . ramipril (ALTACE) 10 MG capsule TAKE 1 CAPSULE BY MOUTH  DAILY 90 capsule 1   No current facility-administered medications on file prior to visit.     ROS ROS otherwise unremarkable unless listed above.  Physical Examination: BP 134/72   Pulse 60   Temp (!) 97.5 F (36.4 C)   Resp 16   Ht 5\' 8"  (1.727 m)   Wt 209 lb 3.2 oz (94.9 kg)   SpO2 95%   BMI 31.81 kg/m  Ideal Body Weight: Weight in (lb) to have BMI = 25: 164.1  Physical Exam  Constitutional: He is oriented to person, place, and time. He appears well-developed and well-nourished. No distress.  HENT:  Head: Normocephalic and atraumatic.  Eyes: Conjunctivae and EOM are normal. Pupils are equal, round, and reactive to light.  Cardiovascular: Normal rate.  Pulmonary/Chest:  Effort normal. No respiratory distress.  Musculoskeletal:  Right great toe erythema at the dip area.  Tender at the medial area of this.  No purulence in appearance.   Neurological: He is alert and oriented to person, place, and time.  Skin: Skin is warm and dry. He is not diaphoretic.  Psychiatric: He has a normal mood and affect. His behavior is normal.    Dg Toe Great Right  Result Date: 11/02/2017 CLINICAL DATA:  Right great toe pain, swelling and erythema of unknown duration. EXAM: RIGHT GREAT TOE COMPARISON:  None. FINDINGS: Soft tissues of the great toe appear somewhat swollen. No acute bony or joint abnormality is seen. Mild degenerative change about the first MTP joint and IP joint of the great toe noted. IMPRESSION: Soft tissue swelling without underlying acute bony or joint abnormality. Electronically Signed   By: Inge Rise M.D.   On: 11/02/2017 16:00     Assessment and Plan: Jonathan [name] is a 67 y.o. male who is here today for cc of right toe pain.   Start an antibiotic.  Possible gout.  Will obtain uric acid at thsi tme.  Given colchicine.   Avoiding steroid or nsaid given co-morbidity, and use of the plavix.  Advised warm compresses as well. Great toe pain, right - Plan: DG Toe Great Right, CBC, Uric Acid, colchicine 0.6 MG tablet, doxycycline (VIBRAMYCIN) 100 MG capsule  Ivar Drape, PA-C [facility] 3/1/201912:58 PM

## 2017-11-02 NOTE — Patient Instructions (Signed)
Please do warm compresses at this time.  This can be a moist warm cloth.   I am obtaining the uric acid, and we will see if any findings come of this.   I am going to give you colchicine at this time to treat for possible gout.  I want you to follow this gout lifestyle choices at this time.   Take medication as prescribed.   Gout Gout is painful swelling that can happen in some of your joints. Gout is a type of arthritis. This condition is caused by having too much uric acid in your body. Uric acid is a chemical that is made when your body breaks down substances called purines. If your body has too much uric acid, sharp crystals can form and build up in your joints. This causes pain and swelling. Gout attacks can happen quickly and be very painful (acute gout). Over time, the attacks can affect more joints and happen more often (chronic gout). Follow these instructions at home: During a Gout Attack  If directed, put ice on the painful area: ? Put ice in a plastic bag. ? Place a towel between your skin and the bag. ? Leave the ice on for 20 minutes, 2-3 times a day.  Rest the joint as much as possible. If the joint is in your leg, you may be given crutches to use.  Raise (elevate) the painful joint above the level of your heart as often as you can.  Drink enough fluids to keep your pee (urine) clear or pale yellow.  Take over-the-counter and prescription medicines only as told by your doctor.  Do not drive or use heavy machinery while taking prescription pain medicine.  Follow instructions from your doctor about what you can or cannot eat and drink.  Return to your normal activities as told by your doctor. Ask your doctor what activities are safe for you. Avoiding Future Gout Attacks  Follow a low-purine diet as told by a specialist (dietitian) or your doctor. Avoid foods and drinks that have a lot of purines, such  as: ? Liver. ? Kidney. ? Anchovies. ? Asparagus. ? Herring. ? Mushrooms ? Mussels. ? Beer.  Limit alcohol intake to no more than 1 drink a day for nonpregnant women and 2 drinks a day for men. One drink equals 12 oz of beer, 5 oz of wine, or 1 oz of hard liquor.  Stay at a healthy weight or lose weight if you are overweight. If you want to lose weight, talk with your doctor. It is important that you do not lose weight too fast.  Start or continue an exercise plan as told by your doctor.  Drink enough fluids to keep your pee clear or pale yellow.  Take over-the-counter and prescription medicines only as told by your doctor.  Keep all follow-up visits as told by your doctor. This is important. Contact a doctor if:  You have another gout attack.  You still have symptoms of a gout attack after10 days of treatment.  You have problems (side effects) because of your medicines.  You have chills or a fever.  You have burning pain when you pee (urinate).  You have pain in your lower back or belly. Get help right away if:  You have very bad pain.  Your pain cannot be controlled.  You cannot pee. This information is not intended to replace advice given to you by your health care provider. Make sure you discuss any questions you have with  your health care provider. Document Released: 06/07/2008 Document Revised: 02/04/2016 Document Reviewed: 06/11/2015 Elsevier Interactive Patient Education  [employer].

## 2017-11-03 LAB — CBC
HEMATOCRIT: 49.1 % (ref 37.5–51.0)
Hemoglobin: 16.4 g/dL (ref 13.0–17.7)
MCH: 32 pg (ref 26.6–33.0)
MCHC: 33.4 g/dL (ref 31.5–35.7)
MCV: 96 fL (ref 79–97)
Platelets: 248 10*3/uL (ref 150–379)
RBC: 5.13 x10E6/uL (ref 4.14–5.80)
RDW: 13.4 % (ref 12.3–15.4)
WBC: 10.6 10*3/uL (ref 3.4–10.8)

## 2017-11-03 LAB — URIC ACID: Uric Acid: 8.2 mg/dL (ref 3.7–8.6)

## 2017-11-10 ENCOUNTER — Encounter: Payer: Self-pay | Admitting: Physician Assistant

## 2017-11-13 ENCOUNTER — Telehealth: Payer: Self-pay | Admitting: Physician Assistant

## 2017-11-13 NOTE — Telephone Encounter (Signed)
Copied from [place] [id number]. Topic: Quick Communication - See Telephone Encounter >> Nov 13, 2017  9:58 AM Margot Ables wrote: CRM for notification. See Telephone encounter for: 11/13/17.  Pt called to f/u on labs for 11/02/17 stating he has not received a call with results.

## 2017-11-14 NOTE — Telephone Encounter (Signed)
Patient is calling to check on the status of getting his lab results. Please contact patient.  [phone]

## 2017-11-14 NOTE — Telephone Encounter (Signed)
Jonathan [name] patient states he has taken all antibiotic but did not start colchicine.  He states he didn't want to mix medications.   He states still has some swelling and tingling and some redness.  Please advise

## 2017-11-14 NOTE — Telephone Encounter (Signed)
He can proceed to take the colchicine since has not. If more pain swelling or fever occur, come back

## 2017-11-21 ENCOUNTER — Encounter: Payer: Self-pay | Admitting: *Deleted

## 2017-11-24 ENCOUNTER — Ambulatory Visit: Payer: Self-pay

## 2017-11-24 ENCOUNTER — Telehealth: Payer: Self-pay | Admitting: Physician Assistant

## 2017-11-24 NOTE — Telephone Encounter (Signed)
Pt given lab results ok to give per CRM. Pt given results per notes of [place] [place] English on 11/20/17.Unable to document in result note due to result note not being routed to [facility]. Pt asking for hard copy of labs to be sent to his home address.  Pt asked if he can take his colchicine.  Per telephone note dated 11/13/17:  Joretta Bachelor, PA     2:24 PM  Note    He can proceed to take the colchicine since has not. If more pain swelling or fever occur, come back        Answer Assessment - Initial Assessment Questions 1. REASON FOR CALL or QUESTION: "What is your reason for calling today?" or "How can I best help you?" or "What question do you have that I can help answer?"     Pt wanting his lab results Pt wanting information on colchicine  Protocols used: INFORMATION ONLY CALL-A-AH

## 2017-11-24 NOTE — Telephone Encounter (Signed)
Attempted to contact pt regarding labs and medication questions; left message at [phone]

## 2017-11-27 NOTE — Telephone Encounter (Signed)
Patients wants a print out of lab results

## 2017-11-28 NOTE — Telephone Encounter (Signed)
Mailed out 11/28/17.

## 2017-11-29 ENCOUNTER — Ambulatory Visit: Payer: Medicare Other | Admitting: Physician Assistant

## 2017-11-30 ENCOUNTER — Telehealth: Payer: Self-pay | Admitting: Pharmacist Clinician (PhC)/ Clinical Pharmacy Specialist

## 2017-11-30 NOTE — Telephone Encounter (Signed)
LMOM to see if patient has had cholesterol labs drawn.  Dr. Claiborne Billings would like to start on PCKS-9 inhibitor

## 2017-11-30 NOTE — Telephone Encounter (Signed)
Patient returned call, LM on our office VM.  Will try to get labs drawn in next week

## 2017-12-06 ENCOUNTER — Telehealth: Payer: Self-pay | Admitting: Physician Assistant

## 2017-12-06 NOTE — Telephone Encounter (Signed)
Called pt to see if we could reschedule his missed appt from 11/29/17. Left VM for pt to call the office.

## 2017-12-13 ENCOUNTER — Encounter: Payer: Self-pay | Admitting: Physician Assistant

## 2018-02-26 ENCOUNTER — Other Ambulatory Visit: Payer: Self-pay | Admitting: Cardiovascular Disease

## 2018-04-16 ENCOUNTER — Ambulatory Visit (INDEPENDENT_AMBULATORY_CARE_PROVIDER_SITE_OTHER)
Admission: RE | Admit: 2018-04-16 | Discharge: 2018-04-16 | Disposition: A | Discharge status: Home / Self Care | Payer: Medicare Other | Source: Ambulatory Visit | Attending: Internal Medicine | Admitting: Internal Medicine

## 2018-04-16 ENCOUNTER — Ambulatory Visit: Payer: Medicare Other | Admitting: Internal Medicine

## 2018-04-16 ENCOUNTER — Encounter: Payer: Self-pay | Admitting: Internal Medicine

## 2018-04-16 VITALS — BP 124/70 | HR 70 | Ht 68.0 in | Wt 216.0 lb

## 2018-04-16 DIAGNOSIS — J449 Chronic obstructive pulmonary disease, unspecified: Secondary | ICD-10-CM

## 2018-04-16 DIAGNOSIS — R0600 Dyspnea, unspecified: Secondary | ICD-10-CM

## 2018-04-16 DIAGNOSIS — R0609 Other forms of dyspnea: Secondary | ICD-10-CM

## 2018-04-16 DIAGNOSIS — F17210 Nicotine dependence, cigarettes, uncomplicated: Secondary | ICD-10-CM

## 2018-04-16 DIAGNOSIS — J320 Chronic maxillary sinusitis: Secondary | ICD-10-CM

## 2018-04-16 DIAGNOSIS — I10 Essential (primary) hypertension: Secondary | ICD-10-CM

## 2018-04-16 HISTORY — DX: Dyspnea, unspecified: R06.00

## 2018-04-16 HISTORY — DX: Other forms of dyspnea: R06.09

## 2018-04-16 HISTORY — DX: Chronic maxillary sinusitis: J32.0

## 2018-04-16 HISTORY — DX: Chronic obstructive pulmonary disease, unspecified: J44.9

## 2018-04-16 IMAGING — DX DG CHEST 2V
2 series · 2 of 2 positions shown · non-contrast
Comparison: [DATE]

CLINICAL DATA: Chronic dyspnea

EXAM:
CHEST - 2 VIEW

[chest pa]
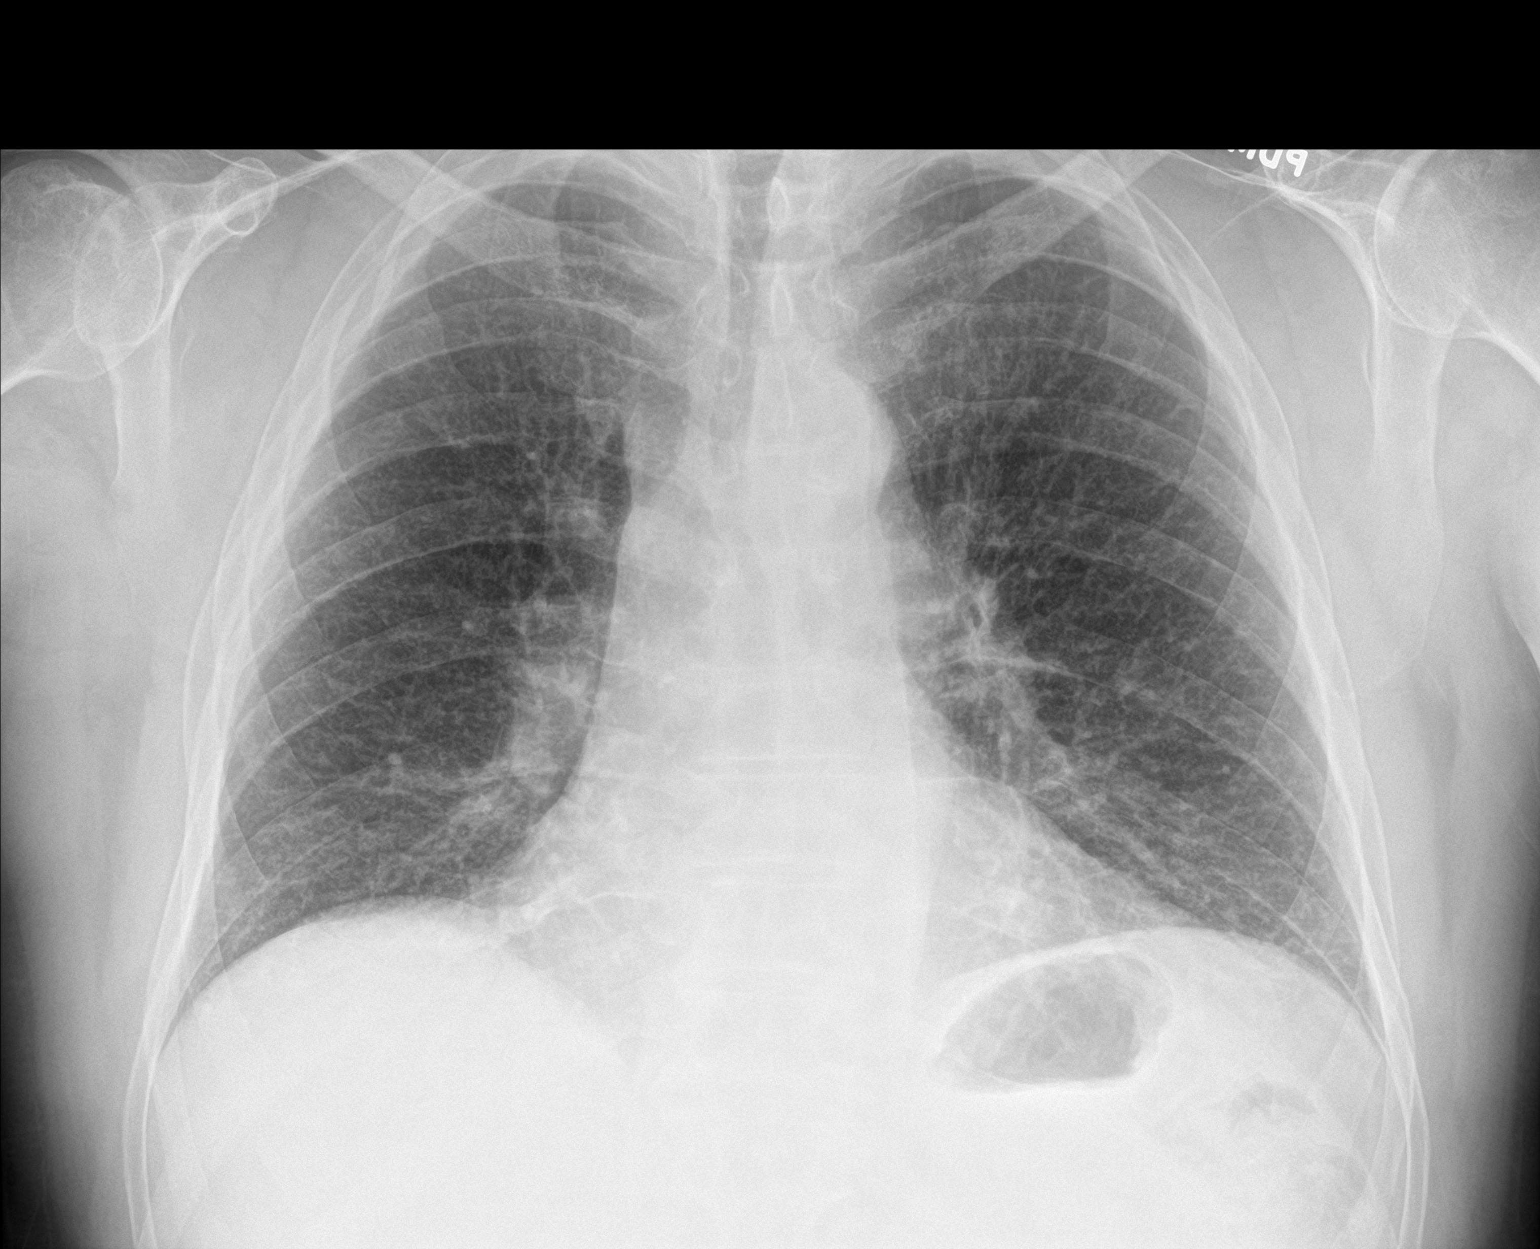

[chest lat]
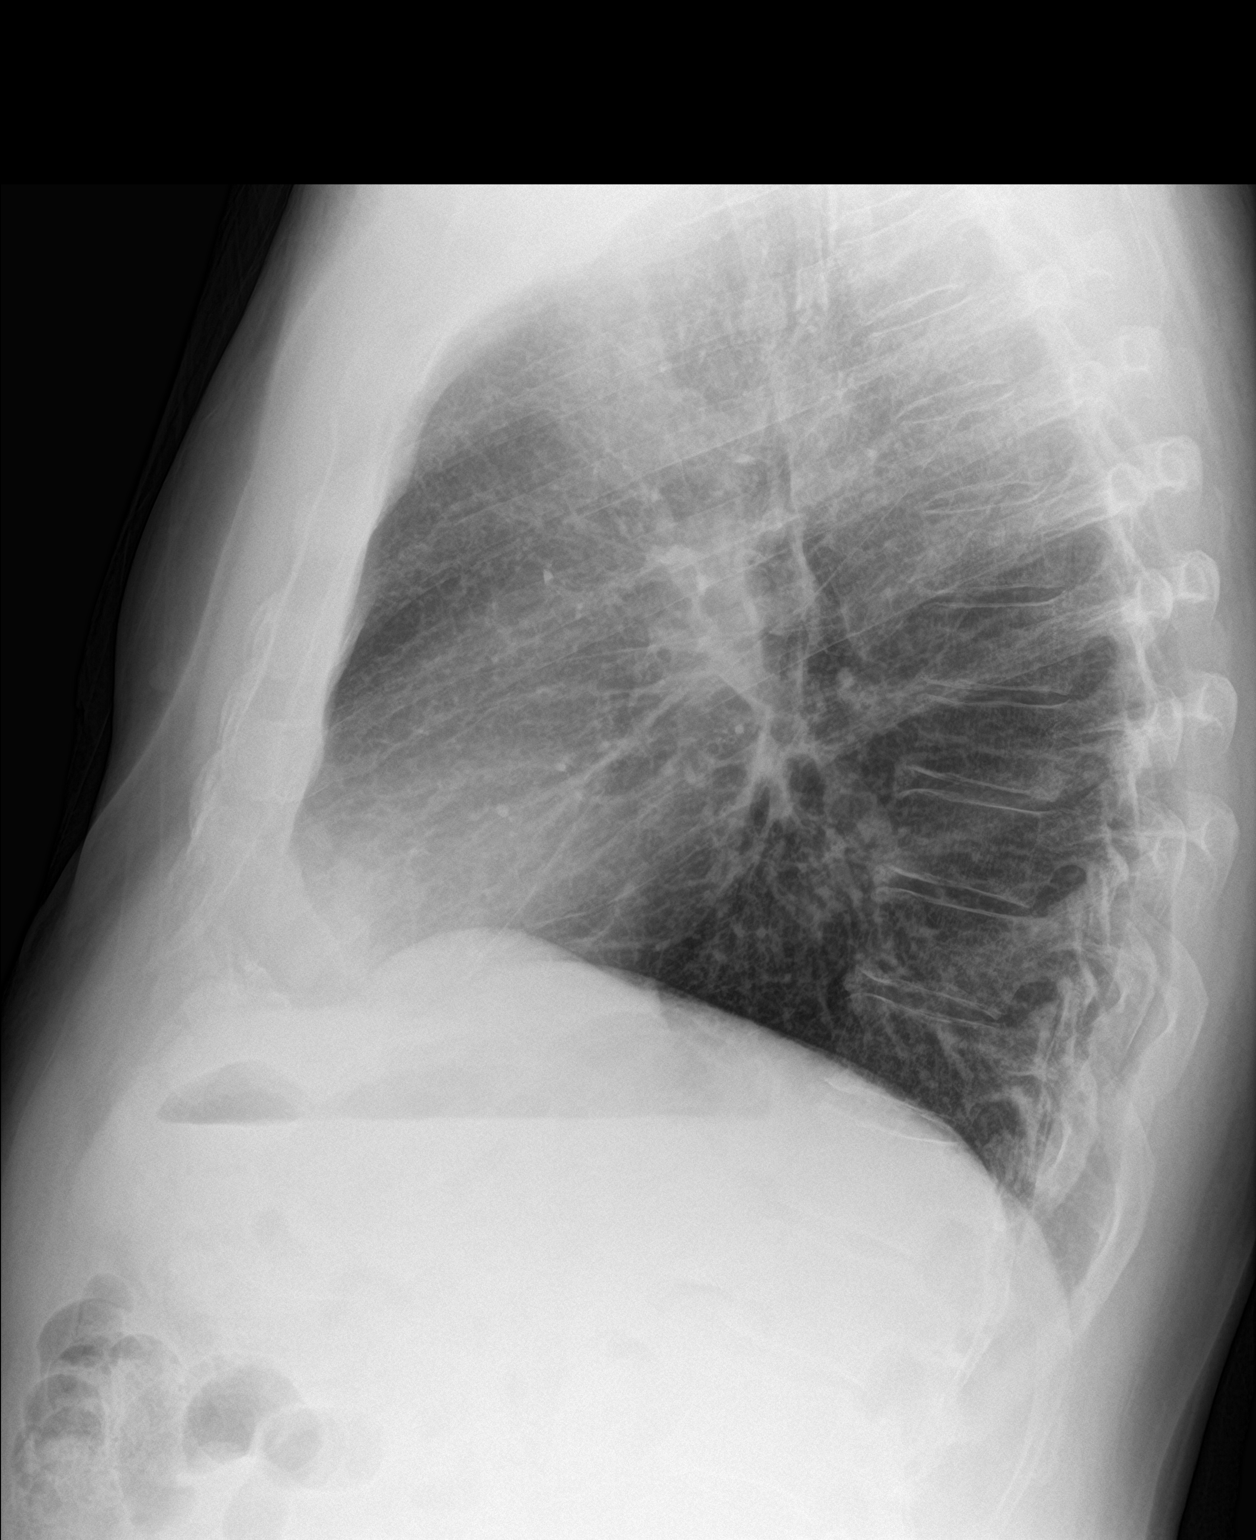

[2 of 2 positions shown; findings below may reference images not displayed]

FINDINGS: Cardiac shadow is stable. The lungs are well aerated bilaterally. No
focal infiltrate or sizable effusion is noted. No acute bony
abnormality is seen.
IMPRESSION: No active cardiopulmonary disease.

## 2018-04-16 MED ORDER — BUDESONIDE-FORMOTEROL FUMARATE 160-4.5 MCG/ACT IN AERO: 2.0000 | INHALATION_SPRAY | Freq: Two times a day (BID) | RESPIRATORY_TRACT | 11 refills | Status: DC

## 2018-04-16 MED ORDER — BUDESONIDE-FORMOTEROL FUMARATE 160-4.5 MCG/ACT IN AERO: 2.0000 | INHALATION_SPRAY | Freq: Two times a day (BID) | RESPIRATORY_TRACT | 0 refills | Status: DC

## 2018-04-16 MED ORDER — VALSARTAN 160 MG PO TABS: 160.0000 mg | ORAL_TABLET | Freq: Every day | ORAL | 11 refills | Status: DC

## 2018-04-16 NOTE — Patient Instructions (Addendum)
Stop ramapril (altace)   Start diovan 160 mg one daily   Stop advair permanently and start symbicort 160 Take 2 puffs first thing in am and then another 2 puffs about 12 hours later.   Try afrin nasal spray twice daily x 5 days only  to see if it helps your sinus pressure  - need to stop x 5 days then restart but only do one side at a time if you fine you need to restart it     The key is to stop smoking completely before smoking completely stops you!    Please see patient coordinator before you leave today  to schedule sinus CT    Please remember to go to the  x-ray department downstairs in the basement  for your tests - we will call you with the results when they are available.      Please schedule a follow up office visit in 6 weeks, call sooner if needed

## 2018-04-16 NOTE — Progress Notes (Signed)
Jonathan [name], male    DOB: [birth year],     MRN: [id number]    Brief patient profile:  42 yowm active smoker with doe x around 2014 and cough each am x winter 2019 on advair and altace and self-referred to pulmonary clinic 04/16/2018      History of Present Illness  04/16/2018  1st pulmonary eval/ Ad [facility]uttman  Chief Complaint  Patient presents with  . Pulmonary Consult    Self referral. Pt c/o SOB for the past year. He has prod cough with white sputum- esp worse in the am's. He states he feels SOB of and on throughout the day but mainly in the early morning and late afternoon. He uses proair 2-3 x per wk on average.  He also c/o sinus pressure.    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 Cough is worse first thing in am thick white mucus  Sleep on R side flat, 2 pillows   Pressure over both max sinuses worse in am x 6 m > [facility] eval "gave me some spray, didn't work"   No obvious other patterns in  day to day or daytime variability or assoc  purulent sputum or mucus plugs or hemoptysis or cp or chest tightness, subjective wheeze or overt  hb symptoms.   Sleeps R side down/ flat bed  without nocturnal    exacerbation  of respiratory  c/o's or need for noct saba. Also denies any obvious fluctuation of symptoms with weather or environmental changes or other aggravating or alleviating factors except as outlined above   No unusual exposure hx or h/o childhood pna/ asthma or knowledge of premature birth.  Current Allergies, Complete Past Medical History, Past Surgical History, Family History, and Social History were reviewed in [employer] record.  ROS  The following are not active complaints unless bolded Hoarseness, sore throat, dysphagia, dental problems,  itching, sneezing,  nasal congestion or discharge of excess mucus or purulent secretions, ear ache,   fever, chills, sweats, unintended wt loss or wt gain, classically pleuritic or exertional cp,  orthopnea pnd or arm/hand swelling  or leg swelling, presyncope, palpitations, abdominal pain, anorexia, nausea, vomiting, diarrhea  or change in bowel habits or change in bladder habits, change in stools or change in urine, dysuria, hematuria,  rash, arthralgias, visual complaints, headache, numbness, weakness or ataxia or problems with walking or coordination,  change in mood or  memory.                     Past Medical History:  Diagnosis Date  . Arthritis    hands   . Chronic bronchitis ([place])   . Heart attack ([place])   . Hypertension   . Myocardial infarction ([place])   . Shortness of breath    with exertion     Outpatient Medications Prior to Visit  Medication Sig Dispense Refill  . albuterol (PROVENTIL HFA;VENTOLIN HFA) 108 (90 BASE) MCG/ACT inhaler Inhale 2 puffs into the lungs every 4 (four) hours as needed for wheezing or shortness of breath (cough, shortness of breath or wheezing.). 1 Inhaler 1  . aspirin 81 MG tablet Take 81 mg by mouth daily.    [name]  clopidogrel (PLAVIX) 75 MG tablet TAKE 1 TABLET BY MOUTH  DAILY 90 tablet 0  . Fluticasone-Salmeterol (ADVAIR DISKUS) 250-50 MCG/DOSE AEPB Inhale 1 puff into the lungs 2 (two) times daily. 60 each 12  . metoprolol succinate (TOPROL-XL) 25 MG 24 hr tablet TAKE 1 TABLET BY MOUTH  DAILY 90 tablet 0  . Omega-3 Fatty Acids (FISH OIL) 1000 MG CAPS Take 2 capsules (2,000 mg total) by mouth daily. 90 capsule 0  . ramipril (ALTACE) 10 MG capsule TAKE 1 CAPSULE BY MOUTH  DAILY 90 capsule 0  . colchicine 0.6 MG tablet Take 1 tablet (0.6 mg total) by mouth daily. Take 1.2mg  once, then repeat 1 hour later.  Do not repeat for 3 days 12 tablet 0  . omega-3 acid ethyl esters (LOVAZA) 1 g capsule Take 1 capsule (1 g total) by mouth 2 (two) times daily.  180 capsule 3            Objective:     BP 124/70 (BP Location: Left Arm, Cuff Size: Normal)   Pulse 70   Ht 5\' 8"  (1.727 m)   Wt 216 lb (98 kg)   SpO2 98%   BMI 32.84 kg/m   SpO2: 98 %  RA  Wt Readings from Last 3 Encounters:  04/16/18 216 lb (98 kg)  11/02/17 209 lb 3.2 oz (94.9 kg)  09/25/17 210 lb 9.6 oz (95.5 kg)      Gruff voice amb wm nad very negative attitude re meds/ doctors    HEENT: upper teeth incisors only/ lower ok and nl  oropharynx. Nl external ear canals without cough reflex- moderate bilateral non-specific turbinate edema     NECK :  without JVD/Nodes/TM/ nl carotid upstrokes bilaterally   LUNGS: no acc muscle use,  Nl contour chest with insp / exp distant rhonchi bilaterally with upper airway component    CV:  RRR  no s3 or murmur or increase in P2, and no edema   ABD:  soft and nontender with nl inspiratory excursion in the supine position. No bruits or organomegaly appreciated, bowel sounds nl  MS:  Nl gait/ ext warm without deformities, calf tenderness, cyanosis ? Mild clubbing  No obvious joint restrictions   SKIN: warm and dry without lesions    NEURO:  alert, approp, nl sensorium with  no motor or cerebellar deficits apparent.      CXR PA and Lateral:   04/16/2018 :    I personally reviewed images and agree with radiology impression as follows:   No active cardiopulmonary disease.         Assessment   COPD GOLD III  active smoker Spirometry 04/16/2018  FEV1 1.43 (47%)  Ratio 62  s prior rx  - 04/16/2018  After extensive coaching inhaler device  effectiveness =    90% > try symbicort 160 2bid   DDX of  difficult airways management almost all start with A and  include Adherence, Ace Inhibitors, Acid Reflux, Active Sinus Disease, Alpha 1 Antitripsin deficiency, Anxiety masquerading as Airways dz,  ABPA,  Allergy(esp in young), Aspiration (esp in elderly), Adverse effects of meds,  Active smokers, A bunch of PE's (a small clot burden  can't cause this syndrome unless there is already severe underlying pulm or vascular dz with poor reserve) plus two Bs  = Bronchiectasis and Beta blocker use..and one C= CHF   Adherence is always the initial "prime suspect" and is a multilayered concern that requires a "trust but verify" approach in  every patient - starting with knowing how to use medications, especially inhalers, correctly, keeping up with refills and understanding the fundamental difference between maintenance and prns vs those medications only taken for a very short course and then stopped and not refilled.  - see hfa teaching - return with all meds in hand using a trust but verify approach to confirm accurate Medication  Reconciliation The principal here is that until we are certain that the  patients are doing what we've asked, it makes no sense to ask them to do more.   Active smoking top of the usual list   ACEi adverse effects also  at the  top of the usual list of suspects and the only way to rule it out is a trial off > see a/p  ? Adverse effects of dpi > d/c advair     ? Active sinus dz > try short course afrin to see if gets relief > sinus CT ordered  ? Anxiety/depresssion / very negative attitude  > usually at the bottom of this list of usual suspects but should be higher on this pt's based on H and P and note already on psychotropics and may interfere with adherence and also interpretation of response or lack thereof to symptom management which can be quite subjective.   ? Alpha One AT > needs screen on return   ? Allergy > try high dose ICS and blow out thru nose  ? Beta blocker effects . Usually not an issue on such low doses of lopressor but if needs much higher doses prefer bisoprolol or bystolic being considered as they are much more Beta 1 specific.      Chronic sinusitis of both maxillary sinuses Has already seen ENT, declines going back to Dr Lucia Gaskins? Tried nasal steroids per pt's  description   Will do sinus ct and offer short term afrin prn pending ct   DOE (dyspnea on exertion) See copd   Cigarette smoker 4-5 min discussion re active cigarette smoking in addition to office E&M  Ask about tobacco use:   Ongoing/ appears to be in denial re risks Advise quitting   I emphasized that although we never turn away smokers from the pulmonary clinic, we do ask that they understand that the recommendations that we make  won't work nearly as well in the presence of continued cigarette exposure. In fact, we may very well  reach a point where we can't promise to help the patient if he/she can't quit smoking. (We can and will promise to try to help, we just can't promise what we recommend will really work)  Assess willingness:  Not committed at this point Assist in quit attempt:  Per PCP when ready Arrange follow up:   Follow up per Primary Care planned        Essential hypertension, benign ACE inhibitors are problematic in  pts with airway complaints because  even experienced pulmonologists can't always distinguish ace effects from copd/asthma.  By themselves they don't actually cause a problem, much like oxygen can't by itself start a fire, but they certainly serve as a powerful catalyst or enhancer for any "fire"  or inflammatory process in the upper airway, be it caused by an ET  tube or more commonly reflux (especially in the obese or pts with known GERD or who are on biphoshonates).    In the era of ARB near equivalency until we have a better handle on the reversibility of the airway problem, it just  makes sense to avoid ACEI  entirely in the short run and then decide later, having established a level of airway control using a reasonable limited regimen, whether to add back ace but even then being very careful to observe the pt for worsening airway control and number of meds used/ needed to control symptoms.     Try diovan 160 mg daily and f/u in 6 weeks       Total  time devoted to counseling  > 50 % of initial 60 min office visit:  review case with pt/ discussion of options/alternatives/ personally creating written customized instructions  in presence of pt  then going over those specific  Instructions directly with the pt including how to use all of the meds but in particular covering each new medication in detail and the difference between the maintenance= "automatic" meds and the prns using an action plan format for the latter (If this problem/symptom => do that organization reading Left to right).  Please see AVS from this visit for a full list of these instructions which I personally wrote for this pt and  are unique to this visit.   See device teaching which extended face to face time for this visit      Christinia Gully, MD 04/16/2018

## 2018-04-17 ENCOUNTER — Telehealth: Payer: Self-pay | Admitting: Internal Medicine

## 2018-04-17 ENCOUNTER — Encounter: Payer: Self-pay | Admitting: Internal Medicine

## 2018-04-17 DIAGNOSIS — F17210 Nicotine dependence, cigarettes, uncomplicated: Secondary | ICD-10-CM | POA: Insufficient documentation

## 2018-04-17 NOTE — Telephone Encounter (Signed)
Attempted to call patient today regarding results. I did not receive an answer at time of call. I have left a voicemail message for pt to return call. X1  

## 2018-04-17 NOTE — Assessment & Plan Note (Signed)
See copd  

## 2018-04-17 NOTE — Assessment & Plan Note (Addendum)
Spirometry 04/16/2018  FEV1 1.43 (47%)  Ratio 62  s prior rx  - 04/16/2018  After extensive coaching inhaler device  effectiveness =    90% > try symbicort 160 2bid   DDX of  difficult airways management almost all start with A and  include Adherence, Ace Inhibitors, Acid Reflux, Active Sinus Disease, Alpha 1 Antitripsin deficiency, Anxiety masquerading as Airways dz,  ABPA,  Allergy(esp in young), Aspiration (esp in elderly), Adverse effects of meds,  Active smokers, A bunch of PE's (a small clot burden can't cause this syndrome unless there is already severe underlying pulm or vascular dz with poor reserve) plus two Bs  = Bronchiectasis and Beta blocker use..and one C= CHF   Adherence is always the initial "prime suspect" and is a multilayered concern that requires a "trust but verify" approach in every patient - starting with knowing how to use medications, especially inhalers, correctly, keeping up with refills and understanding the fundamental difference between maintenance and prns vs those medications only taken for a very short course and then stopped and not refilled.  - see hfa teaching - return with all meds in hand using a trust but verify approach to confirm accurate Medication  Reconciliation The principal here is that until we are certain that the  patients are doing what we've asked, it makes no sense to ask them to do more.   Active smoking top of the usual list   ACEi adverse effects also  at the  top of the usual list of suspects and the only way to rule it out is a trial off > see a/p  ? Adverse effects of dpi > d/c advair     ? Active sinus dz > try short course afrin to see if gets relief > sinus CT ordered  ? Anxiety/depresssion / very negative attitude  > usually at the bottom of this list of usual suspects but should be higher on this pt's based on H and P and note already on psychotropics and may interfere with adherence and also interpretation of response or lack thereof to  symptom management which can be quite subjective.   ? Alpha One AT > needs screen on return   ? Allergy > try high dose ICS and blow out thru nose  ? Beta blocker effects . Usually not an issue on such low doses of lopressor but if needs much higher doses prefer bisoprolol or bystolic being considered as they are much more Beta 1 specific.

## 2018-04-17 NOTE — Assessment & Plan Note (Signed)
4-5 min discussion re active cigarette smoking in addition to office E&M  Ask about tobacco use:   Ongoing/ appears to be in denial re risks Advise quitting   I emphasized that although we never turn away smokers from the pulmonary clinic, we do ask that they understand that the recommendations that we make  won't work nearly as well in the presence of continued cigarette exposure. In fact, we may very well  reach a point where we can't promise to help the patient if he/she can't quit smoking. (We can and will promise to try to help, we just can't promise what we recommend will really work)  Assess willingness:  Not committed at this point Assist in quit attempt:  Per PCP when ready Arrange follow up:   Follow up per Primary Care planned

## 2018-04-17 NOTE — Progress Notes (Signed)
LMTCB

## 2018-04-17 NOTE — Assessment & Plan Note (Signed)
Has already seen ENT, declines going back to Dr Lucia Gaskins? Tried nasal steroids per pt's description   Will do sinus ct and offer short term afrin prn pending ct

## 2018-04-17 NOTE — Assessment & Plan Note (Signed)
ACE inhibitors are problematic in  pts with airway complaints because  even experienced pulmonologists can't always distinguish ace effects from copd/asthma.  By themselves they don't actually cause a problem, much like oxygen can't by itself start a fire, but they certainly serve as a powerful catalyst or enhancer for any "fire"  or inflammatory process in the upper airway, be it caused by an ET  tube or more commonly reflux (especially in the obese or pts with known GERD or who are on biphoshonates).    In the era of ARB near equivalency until we have a better handle on the reversibility of the airway problem, it just makes sense to avoid ACEI  entirely in the short run and then decide later, having established a level of airway control using a reasonable limited regimen, whether to add back ace but even then being very careful to observe the pt for worsening airway control and number of meds used/ needed to control symptoms.     Try diovan 160 mg daily and f/u in 6 weeks       Total time devoted to counseling  > 50 % of initial 60 min office visit:  review case with pt/ discussion of options/alternatives/ personally creating written customized instructions  in presence of pt  then going over those specific  Instructions directly with the pt including how to use all of the meds but in particular covering each new medication in detail and the difference between the maintenance= "automatic" meds and the prns using an action plan format for the latter (If this problem/symptom => do that organization reading Left to right).  Please see AVS from this visit for a full list of these instructions which I personally wrote for this pt and  are unique to this visit.   See device teaching which extended face to face time for this visit

## 2018-04-18 NOTE — Telephone Encounter (Signed)
Tanda Rockers, MD  Rosana Berger, CMA        Call pt: Reviewed cxr and no acute change so no change in recommendations made at ov    He is aware of results. Verbalized understanding. Nothing further needed at time of call.

## 2018-04-18 NOTE — Telephone Encounter (Signed)
Patient is calling back [phone]

## 2018-04-25 ENCOUNTER — Ambulatory Visit (INDEPENDENT_AMBULATORY_CARE_PROVIDER_SITE_OTHER)
Admission: RE | Admit: 2018-04-25 | Discharge: 2018-04-25 | Disposition: A | Discharge status: Home / Self Care | Payer: Medicare Other | Source: Ambulatory Visit | Attending: Internal Medicine | Admitting: Internal Medicine

## 2018-04-25 DIAGNOSIS — J320 Chronic maxillary sinusitis: Secondary | ICD-10-CM

## 2018-04-25 IMAGING — CT CT PARANASAL SINUSES LIMITED
1 of 2 series · 9 of 12 positions shown, 12 images · non-contrast
Comparison: None.

CLINICAL DATA: Initial evaluation for sinus pressure, drainage,
chronic cough for years.

EXAM:
CT PARANASAL SINUS LIMITED WITHOUT CONTRAST
TECHNIQUE: Non-contiguous multidetector CT images of the paranasal sinuses were
obtained in a single plane without contrast.

[Series 4: limited sinus st · axial · 0.23mm/px · z∈[+121,+201]mm · 9 of 11 slices shown, 12 images]
[im 2/11  brain]
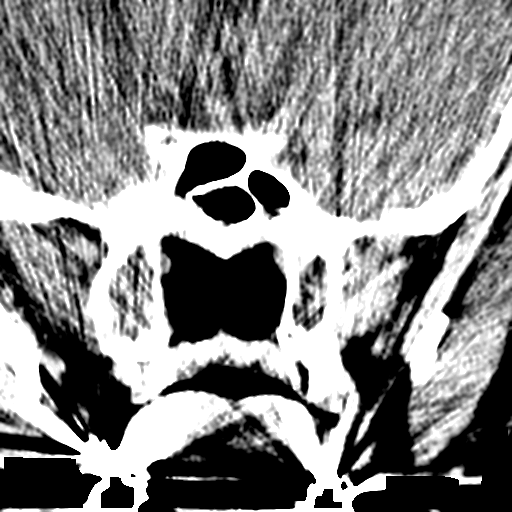
[im 2/11  bone]
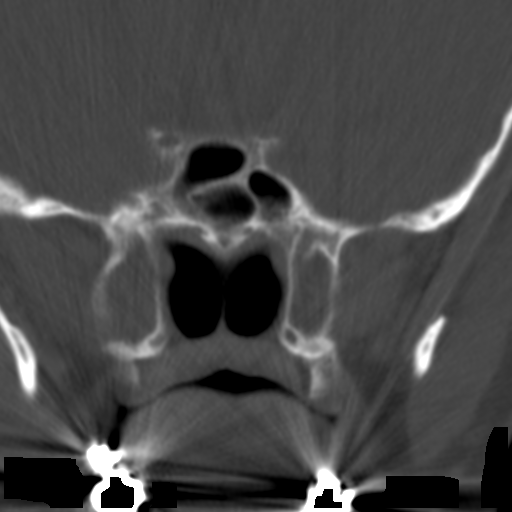
[im 3/11  bone]
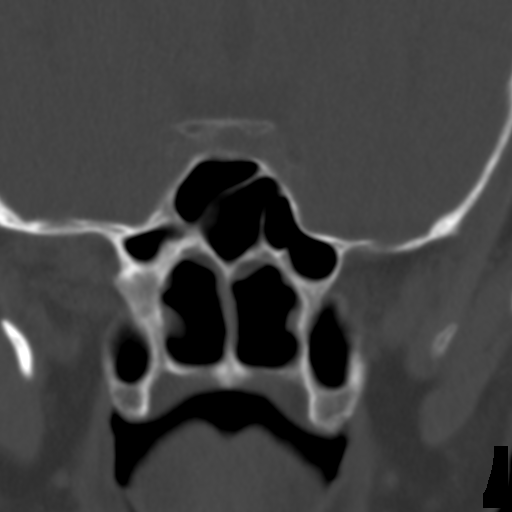
[im 4/11  bone]
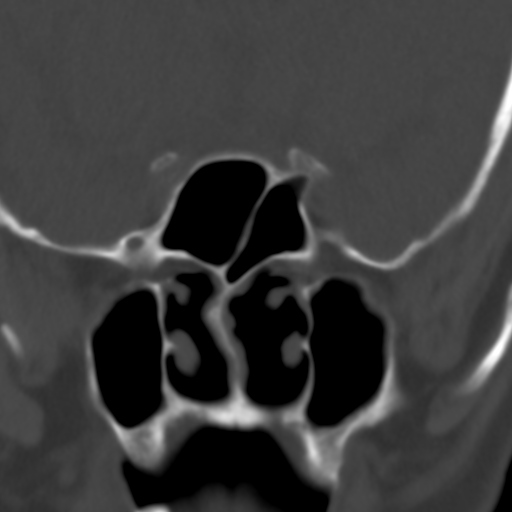
[im 5/11  bone]
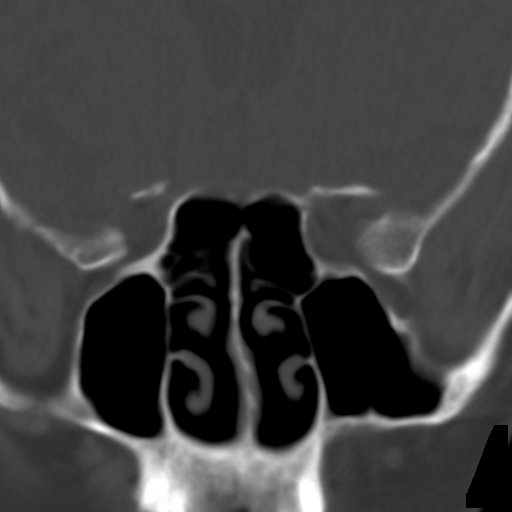
[im 6/11  brain]
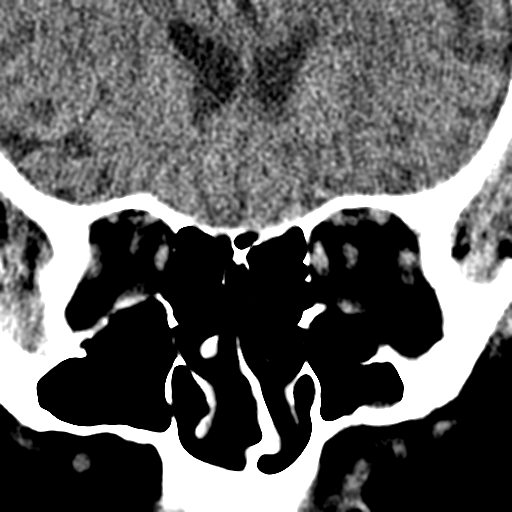
[im 6/11  bone]
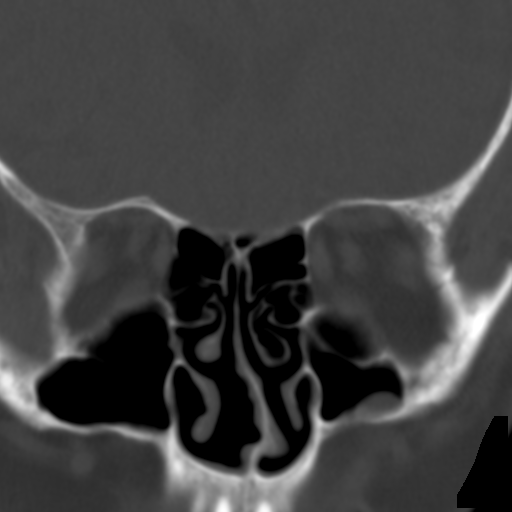
[im 7/11  bone]
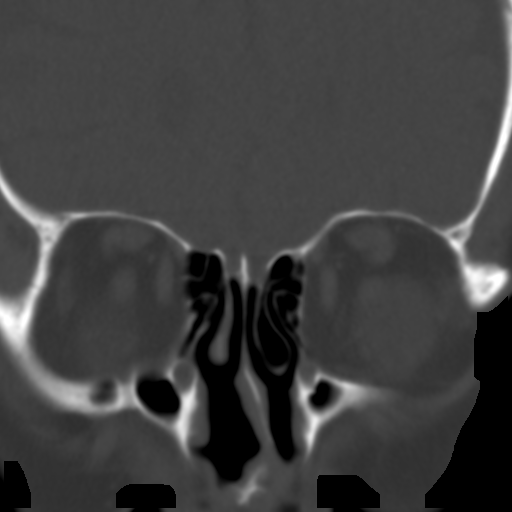
[im 8/11  bone]
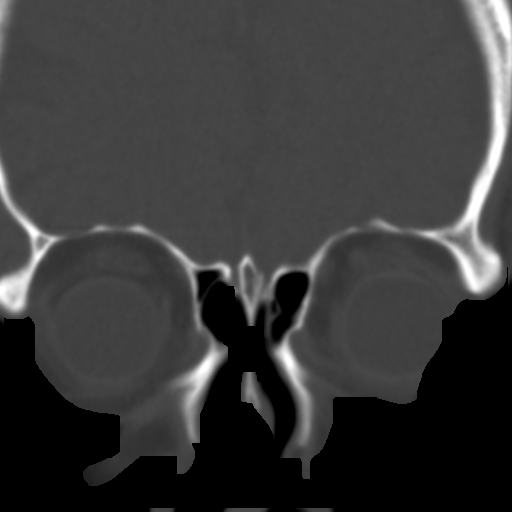
[im 9/11  bone]
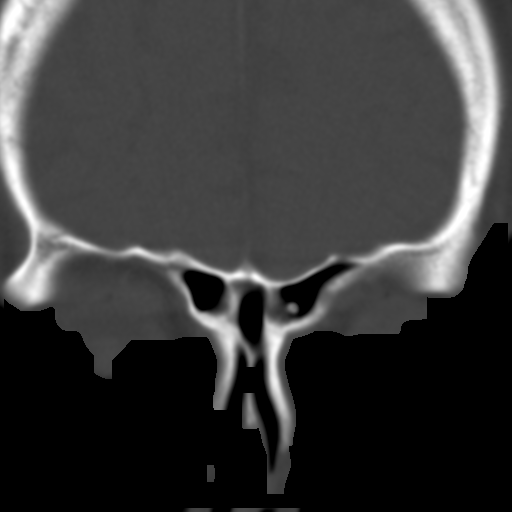
[im 10/11  brain]
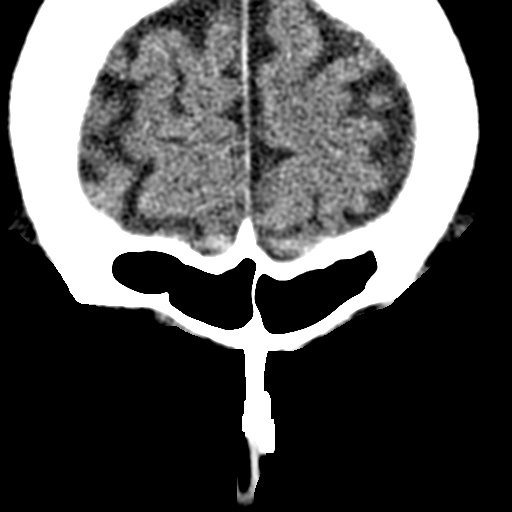
[im 10/11  bone]
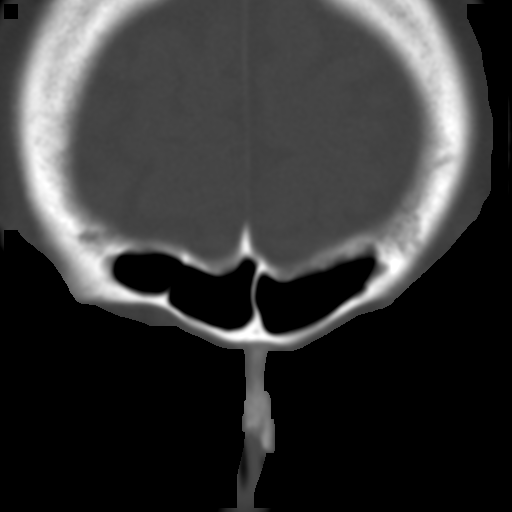

[9 of 12 positions shown; findings below may reference images not displayed]

FINDINGS: Minimal mucosal thickening at the frontoethmoidal recesses
bilaterally. Visualized frontal sinuses are otherwise clear. 3 mm
nonobstructive osteoma noted at the anterior right frontal sinus
(series 3, image 3). Mild chronic mucoperiosteal thickening
throughout the visualized ethmoidal air cells without significant
sinus opacification. 9 mm retention cyst at the floor of the right
maxillary sinus. Visualized maxillary sinuses otherwise clear.
Visualized sphenoid sinuses clear bilaterally.

S shaped bowing of the nasal septum with up to 3 mm to the left and
7 mm to the right. Concha bullosa of the right middle turbinate
noted. Nasal passages are clear.

No air-fluid levels to suggest acute sinusitis.

Visualized intracranial contents unremarkable. Visualized globes and
orbital soft tissues within normal limits.
IMPRESSION: 1. Mild mucoperiosteal thickening involving the frontoethmoidal
sinuses. Otherwise largely clear sinuses with no evidence for acute
sinusitis.
2. 9 mm right maxillary sinus retention cyst.
3. 3 mm nonobstructive right frontal sinus osteoma.
4. S shaped nasal septal deviation as above.

## 2018-04-25 NOTE — Progress Notes (Signed)
LMTCB

## 2018-04-26 ENCOUNTER — Telehealth: Payer: Self-pay | Admitting: Internal Medicine

## 2018-04-26 NOTE — Telephone Encounter (Signed)
Pt is returning call. Cb is [phone]

## 2018-04-26 NOTE — Telephone Encounter (Signed)
Advised pt of results. Pt understood and nothing further is needed.   

## 2018-04-26 NOTE — Telephone Encounter (Signed)
  ATC pt, no answer. Left message for pt to call back.   Notes recorded by Tanda Rockers, MD on 04/25/2018 at 4:56 PM EDT Call patient : Study is unremarkable, no change in recs

## 2018-05-28 ENCOUNTER — Ambulatory Visit: Payer: Medicare Other | Admitting: Internal Medicine

## 2018-06-04 ENCOUNTER — Other Ambulatory Visit: Payer: Self-pay | Admitting: Cardiovascular Disease

## 2018-06-08 ENCOUNTER — Ambulatory Visit: Payer: Medicare Other | Admitting: Internal Medicine

## 2018-06-08 ENCOUNTER — Encounter: Payer: Self-pay | Admitting: Internal Medicine

## 2018-06-08 ENCOUNTER — Ambulatory Visit (INDEPENDENT_AMBULATORY_CARE_PROVIDER_SITE_OTHER): Payer: Medicare Other | Admitting: Internal Medicine

## 2018-06-08 VITALS — BP 128/72 | HR 60 | Ht 66.5 in | Wt 215.0 lb

## 2018-06-08 DIAGNOSIS — R0609 Other forms of dyspnea: Secondary | ICD-10-CM

## 2018-06-08 DIAGNOSIS — J320 Chronic maxillary sinusitis: Secondary | ICD-10-CM

## 2018-06-08 DIAGNOSIS — J449 Chronic obstructive pulmonary disease, unspecified: Secondary | ICD-10-CM | POA: Diagnosis not present

## 2018-06-08 DIAGNOSIS — I10 Essential (primary) hypertension: Secondary | ICD-10-CM | POA: Diagnosis not present

## 2018-06-08 DIAGNOSIS — F17210 Nicotine dependence, cigarettes, uncomplicated: Secondary | ICD-10-CM | POA: Diagnosis not present

## 2018-06-08 LAB — PULMONARY FUNCTION TEST
DL/VA % pred: 72 %
DL/VA: 3.19 ml/min/mmHg/L
DLCO UNC: 17.59 ml/min/mmHg
DLCO unc % pred: 63 %
FEF 25-75 Post: 1.35 L/sec
FEF 25-75 Pre: 1 L/sec
FEF2575-%Change-Post: 35 %
FEF2575-%Pred-Post: 61 %
FEF2575-%Pred-Pre: 45 %
FEV1-%CHANGE-POST: 6 %
FEV1-%PRED-POST: 71 %
FEV1-%Pred-Pre: 66 %
FEV1-PRE: 1.92 L
FEV1-Post: 2.03 L
FEV1FVC-%Change-Post: -4 %
FEV1FVC-%PRED-PRE: 87 %
FEV6-%Change-Post: 10 %
FEV6-%PRED-POST: 88 %
FEV6-%PRED-PRE: 79 %
FEV6-POST: 3.22 L
FEV6-Pre: 2.91 L
FEV6FVC-%CHANGE-POST: 0 %
FEV6FVC-%Pred-Post: 103 %
FEV6FVC-%Pred-Pre: 104 %
FVC-%Change-Post: 11 %
FVC-%PRED-PRE: 76 %
FVC-%Pred-Post: 85 %
FVC-POST: 3.31 L
FVC-PRE: 2.97 L
POST FEV6/FVC RATIO: 97 %
PRE FEV6/FVC RATIO: 98 %
Post FEV1/FVC ratio: 61 %
Pre FEV1/FVC ratio: 65 %
RV % PRED: 161 %
RV: 3.62 L
TLC % PRED: 106 %
TLC: 6.75 L

## 2018-06-08 MED ORDER — BUDESONIDE-FORMOTEROL FUMARATE 160-4.5 MCG/ACT IN AERO: 2.0000 | INHALATION_SPRAY | Freq: Two times a day (BID) | RESPIRATORY_TRACT | 11 refills | Status: DC

## 2018-06-08 MED ORDER — BUDESONIDE-FORMOTEROL FUMARATE 160-4.5 MCG/ACT IN AERO: 2.0000 | INHALATION_SPRAY | Freq: Two times a day (BID) | RESPIRATORY_TRACT | 0 refills | Status: DC

## 2018-06-08 NOTE — Patient Instructions (Addendum)
Plan A = Automatic = symbicort 160 Take 2 puffs first thing in am and then another 2 puffs about 12 hours later.   Work on inhaler technique:  relax and gently blow all the way out then take a nice smooth deep breath back in, triggering the inhaler at same time you start breathing in.  Hold for up to 5 seconds if you can. Blow out thru nose. Rinse and gargle with water when done   Plan B = Backup Only use your albuterol as a rescue medication to be used if you can't catch your breath by resting or doing a relaxed purse lip breathing pattern.  - The less you use it, the better it will work when you need it. - Ok to use the inhaler up to 2 puffs  every 4 hours if you must but call for appointment if use goes up over your usual need - Don't leave home without it !!  (think of it like the spare tire for your car)    The key is to stop smoking completely before smoking completely stops you - it's not too late!    If you are satisfied with your treatment plan,  let your doctor know and he/she can either refill your medications or you can return here when your prescription runs out.     If in any way you are not 100% satisfied,  please tell us.  If 100% better, tell your friends!  Pulmonary follow up is as needed

## 2018-06-08 NOTE — Progress Notes (Signed)
Jonathan [name], male    DOB: 1950/11/20,     MRN: [id number]    Brief patient profile:  62 yowm active smoker with doe x around 2014 and cough each am x winter 2019 on advair and altace and self-referred to pulmonary clinic 04/16/2018     History of Present Illness  04/16/2018  1st pulmonary eval/ Olubunmi Rothenberger  Chief Complaint  Patient presents with  . Pulmonary Consult    Self referral. Pt c/o SOB for the past year. He has prod cough with white sputum- esp worse in the am's. He states he feels SOB of and on throughout the day but mainly in the early morning and late afternoon. He uses proair 2-3 x per wk on average.  He also c/o sinus pressure.    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 Cough is worse first thing in am thick white mucus  Sleep on R side flat, 2 pillows  rec Stop ramapril (altace)  Start diovan 160 mg one daily  Stop advair permanently and start symbicort 160 Take 2 puffs first thing in am and then another 2 puffs about 12 hours later.  Try afrin nasal spray twice daily x 5 days only  to see if it helps your sinus pressure  - need to stop x 5 days then restart but only do one side at a time if you fine you need to restart it  The key is to stop smoking completely before smoking completely stops you!  Please see patient coordinator before you leave today  to schedule sinus CT      06/08/2018  f/u ov/Marshay Slates re: GOLD II  Copd still smoking  Chief Complaint  Patient presents with  . Follow-up    PFT's done today. Breathing is unchanged. He is using his albuterol inhaler 2-3 x per wk.   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   Sleeping as above  without nocturnal  or early am exacerbation  of respiratory  c/o's or need for noct saba. Also denies any obvious fluctuation of symptoms with weather or environmental changes or other aggravating or alleviating factors except as outlined above   No unusual exposure hx or h/o childhood pna/ asthma or knowledge of premature birth.  Current Allergies, Complete Past Medical History, Past Surgical History, Family History, and Social History were reviewed in [employer] record.  ROS  The following are not active complaints unless bolded Hoarseness, sore throat, dysphagia, dental problems, itching, sneezing,  nasal congestion or discharge of excess mucus or purulent secretions, ear ache,   fever, chills, sweats, unintended wt loss or wt gain, classically pleuritic or exertional cp,  orthopnea pnd or arm/hand swelling  or leg swelling, presyncope,  palpitations, abdominal pain, anorexia, nausea, vomiting, diarrhea  or change in bowel habits or change in bladder habits, change in stools or change in urine, dysuria, hematuria,  rash, arthralgias, visual complaints, headache, numbness, weakness or ataxia or problems with walking or coordination,  change in mood or  memory.        Current Meds  Medication Sig  . albuterol (PROVENTIL HFA;VENTOLIN HFA) 108 (90 BASE) MCG/ACT inhaler Inhale 2 puffs into the lungs every 4 (four) hours as needed for wheezing or shortness of breath (cough, shortness of breath or wheezing.).  [name] aspirin 81 MG tablet Take 81 mg by mouth daily.  . budesonide-formoterol (SYMBICORT) 160-4.5 MCG/ACT inhaler Inhale 2 puffs into the lungs 2 (two) times daily.  . clopidogrel (PLAVIX) 75 MG tablet TAKE 1 TABLET  BY MOUTH  DAILY  . metoprolol succinate (TOPROL-XL) 25 MG 24 hr tablet TAKE 1 TABLET BY MOUTH  DAILY  . Omega-3 Fatty Acids (FISH OIL) 1000 MG CAPS Take 2 capsules (2,000 mg total) by mouth daily.  . valsartan (DIOVAN) 160 MG tablet Take 1 tablet (160 mg total) by mouth daily.  .     .            Objective:    06/08/2018       215   04/16/18 216 lb (98 kg)  11/02/17 209 lb 3.2 oz (94.9 kg)  09/25/17 210 lb 9.6 oz (95.5 kg)      amb wm less gruff voice    Vital signs reviewed - Note on arrival 02 sats  95% on RA and BP 128/72       HEENT: nl  oropharynx. Nl external ear canals without cough reflex -  Mild bilateral non-specific turbinate edema/ upper incisors, no lower teeth    NECK :  without JVD/Nodes/TM/ nl carotid upstrokes bilaterally   LUNGS: no acc muscle use,  Mild barrel  contour chest wall with bilateral  Distant bs with minimal rhonchi and  without cough on insp or exp maneuver and mild  Hyperresonant  to  percussion bilaterally     CV:  RRR  no s3 or murmur or increase in P2, and no edema   ABD:  soft and nontender with pos late  insp Hoover's  in the supine position. No bruits or organomegaly appreciated, bowel sounds nl  MS:   Nl gait/  ext warm without deformities, calf tenderness, cyanosis or clubbing No obvious joint restrictions   SKIN: warm and dry without lesions    NEURO:  alert, approp, nl sensorium with  no motor or cerebellar deficits apparent.            Assessment

## 2018-06-08 NOTE — Progress Notes (Signed)
PFT done today. 

## 2018-06-09 ENCOUNTER — Encounter: Payer: Self-pay | Admitting: Internal Medicine

## 2018-06-09 NOTE — Assessment & Plan Note (Signed)
Sinus CT 04/25/2018 Mild mucoperiosteal thickening involving the frontoethmoidal sinuses. Otherwise largely clear sinuses with no evidence for acute sinusitis.   No evidence of active sinus dz with pattern of HA most c/w tension ha > referred to PCP

## 2018-06-09 NOTE — Assessment & Plan Note (Addendum)
Spirometry 04/16/2018  FEV1 1.43 (47%)  Ratio 62  s prior rx  - 04/16/2018    try symbicort 160 2bid - PFT's  06/08/2018  FEV1 2.03 (71 % ) ratio 61  p 5 % improvement from saba p nothing prior to study with DLCO  63 % corrects to 72  % for alv volume    -  06/08/2018  After extensive coaching inhaler device,  effectiveness =    75%  (short ti/ variable flow) > continue symbicort 160 2bid   Despite smoking he is not as bad as previously tested and clearly therefore has enough of reversible component to rx as AB with symbicort 160 2bid

## 2018-06-09 NOTE — Assessment & Plan Note (Signed)
4-5 min discussion re active cigarette smoking in addition to office E&M  Ask about tobacco use:   Active  Advise quitting  I reviewed the Fletcher curve with the patient that basically indicates  if you quit smoking when your best day FEV1 is still well preserved (as is clearly  the case here)  it is highly unlikely you will progress to severe disease and informed the patient there was  no medication on the market that has proven to alter the curve/ its downward trajectory  or the likelihood of progression of their disease(unlike other chronic medical conditions such as atheroclerosis where we do think we can change the natural hx with risk reducing meds)    Therefore stopping smoking and maintaining abstinence are  the most important aspects of care, not choice of inhalers or for that matter, doctors.   Treatment other than smoking cessation  is entirely directed by severity of symptoms and focused also on reducing exacerbations, not attempting to change the natural history of the disease.   Assess willingness:  Not committed at this point Assist in quit attempt:  Per PCP when ready Arrange follow up:   Follow up per Primary Care planned

## 2018-06-09 NOTE — Assessment & Plan Note (Signed)
Changed acei to arb 04/16/2018 due to pseudowheeze > improved 06/08/2018    Although even in retrospect it may not be clear the ACEi contributed to the pt's symptoms,  Pt improved off them and adding them back at this point or in the future would risk confusion in interpretation of non-specific respiratory symptoms to which this patient is prone  ie  Better not to muddy the waters here > continue off acei  Adequate control on present rx, reviewed in detail with pt > no change in rx needed  = valsartan 160 mg one daily    I had an extended discussion with the patient reviewing all relevant studies completed to date and  lasting 15 to 20 minutes of a 25 minute visit    See device teaching which extended face to face time for this visit.  Each maintenance medication was reviewed in detail including emphasizing most importantly the difference between maintenance and prns and under what circumstances the prns are to be triggered using an action plan format that is not reflected in the computer generated alphabetically organized AVS which I have not found useful in most complex patients, especially with respiratory illnesses  Please see AVS for specific instructions unique to this visit that I personally wrote and verbalized to the the pt in detail and then reviewed with pt  by my nurse highlighting any  changes in therapy recommended at today's visit to their plan of care.

## 2018-09-18 ENCOUNTER — Other Ambulatory Visit: Payer: Self-pay | Admitting: Cardiovascular Disease

## 2018-09-19 NOTE — Telephone Encounter (Signed)
Rx(s) sent to pharmacy electronically.  

## 2018-10-01 ENCOUNTER — Telehealth: Payer: Self-pay | Admitting: Internal Medicine

## 2018-10-01 NOTE — Telephone Encounter (Signed)
Received denial on symbicort  Advair is the only listed alternative  Pt last seen 06/08/18 and told to f/u prn only  Please advise

## 2018-10-01 NOTE — Telephone Encounter (Signed)
PA request received from [facility] requested: Symbicort 160 CMM Key: [id number] PA request has been sent to plan, and a determination is expected within 5 business days.   Routing to Tamora for follow-up.

## 2018-10-02 NOTE — Telephone Encounter (Signed)
Give him a sample of the 160 and arrange f/u ov before it runs out with formulary in hand

## 2018-10-02 NOTE — Telephone Encounter (Signed)
LMTCB

## 2018-10-08 ENCOUNTER — Other Ambulatory Visit: Payer: Self-pay

## 2018-10-08 MED ORDER — METOPROLOL SUCCINATE ER 25 MG PO TB24: 25.0000 mg | ORAL_TABLET | Freq: Every day | ORAL | 0 refills | Status: DC

## 2018-10-08 MED ORDER — CLOPIDOGREL BISULFATE 75 MG PO TABS: 75.0000 mg | ORAL_TABLET | Freq: Every day | ORAL | 0 refills | Status: DC

## 2018-10-23 ENCOUNTER — Telehealth: Payer: Self-pay | Admitting: Internal Medicine

## 2018-10-23 NOTE — Telephone Encounter (Signed)
atc pt X2, line rang to fast busy signal.  Wcb.  

## 2018-10-24 MED ORDER — ALBUTEROL SULFATE HFA 108 (90 BASE) MCG/ACT IN AERS: 2.0000 | INHALATION_SPRAY | RESPIRATORY_TRACT | 5 refills | Status: DC | PRN

## 2018-10-24 NOTE — Telephone Encounter (Signed)
Pt is calling back [phone]

## 2018-10-24 NOTE — Telephone Encounter (Signed)
Pt requesting proair refill.  This has been sent to preferred pharmacy.  Nothing further needed.

## 2018-10-24 NOTE — Telephone Encounter (Signed)
Called patient, unable to reach left message to give us a call back. 

## 2018-12-06 ENCOUNTER — Other Ambulatory Visit: Payer: Self-pay | Admitting: *Deleted

## 2018-12-06 MED ORDER — METOPROLOL SUCCINATE ER 25 MG PO TB24: 25.0000 mg | ORAL_TABLET | Freq: Every day | ORAL | 0 refills | Status: DC

## 2018-12-06 MED ORDER — CLOPIDOGREL BISULFATE 75 MG PO TABS: 75.0000 mg | ORAL_TABLET | Freq: Every day | ORAL | 0 refills | Status: DC

## 2019-02-06 ENCOUNTER — Other Ambulatory Visit: Payer: Self-pay | Admitting: Cardiovascular Disease

## 2019-02-06 NOTE — Telephone Encounter (Signed)
Rx has been sent to the pharmacy electronically. ° °

## 2019-03-06 ENCOUNTER — Other Ambulatory Visit: Payer: Self-pay | Admitting: Cardiology

## 2019-03-06 ENCOUNTER — Telehealth: Payer: Self-pay | Admitting: Cardiovascular Disease

## 2019-03-06 MED ORDER — NITROGLYCERIN 0.4 MG SL SUBL: 0.4000 mg | SUBLINGUAL_TABLET | SUBLINGUAL | 3 refills | Status: DC | PRN

## 2019-03-06 NOTE — Progress Notes (Signed)
NTG Rx sent  Kerin Ransom PA-C 03/06/2019 2:28 PM

## 2019-03-06 NOTE — Telephone Encounter (Signed)
  Patient is requesting a refill on nitroquick .4 mg. He states he has not had this since 2005. Please send to [facility]

## 2019-04-11 ENCOUNTER — Other Ambulatory Visit: Payer: Self-pay | Admitting: Cardiovascular Disease

## 2019-07-20 ENCOUNTER — Emergency Department (HOSPITAL_COMMUNITY): Payer: Medicare Other

## 2019-07-20 ENCOUNTER — Encounter (HOSPITAL_COMMUNITY): Payer: Self-pay | Admitting: Emergency Medicine

## 2019-07-20 ENCOUNTER — Other Ambulatory Visit: Payer: Self-pay

## 2019-07-20 ENCOUNTER — Inpatient Hospital Stay (HOSPITAL_COMMUNITY)
Admission: EM | Admit: 2019-07-20 | Discharge: 2019-07-25 | DRG: 281 | Disposition: A | Discharge status: Home / Self Care | Payer: Medicare Other | Attending: Internal Medicine | Admitting: Internal Medicine

## 2019-07-20 DIAGNOSIS — F1010 Alcohol abuse, uncomplicated: Secondary | ICD-10-CM | POA: Diagnosis present

## 2019-07-20 DIAGNOSIS — R079 Chest pain, unspecified: Secondary | ICD-10-CM | POA: Diagnosis present

## 2019-07-20 DIAGNOSIS — Z01810 Encounter for preprocedural cardiovascular examination: Secondary | ICD-10-CM | POA: Diagnosis not present

## 2019-07-20 DIAGNOSIS — J320 Chronic maxillary sinusitis: Secondary | ICD-10-CM | POA: Diagnosis present

## 2019-07-20 DIAGNOSIS — Z888 Allergy status to other drugs, medicaments and biological substances status: Secondary | ICD-10-CM | POA: Diagnosis not present

## 2019-07-20 DIAGNOSIS — M19042 Primary osteoarthritis, left hand: Secondary | ICD-10-CM | POA: Diagnosis present

## 2019-07-20 DIAGNOSIS — Z79899 Other long term (current) drug therapy: Secondary | ICD-10-CM | POA: Diagnosis not present

## 2019-07-20 DIAGNOSIS — I252 Old myocardial infarction: Secondary | ICD-10-CM | POA: Diagnosis not present

## 2019-07-20 DIAGNOSIS — Z7902 Long term (current) use of antithrombotics/antiplatelets: Secondary | ICD-10-CM

## 2019-07-20 DIAGNOSIS — I129 Hypertensive chronic kidney disease with stage 1 through stage 4 chronic kidney disease, or unspecified chronic kidney disease: Secondary | ICD-10-CM | POA: Diagnosis present

## 2019-07-20 DIAGNOSIS — E785 Hyperlipidemia, unspecified: Secondary | ICD-10-CM | POA: Diagnosis not present

## 2019-07-20 DIAGNOSIS — Z66 Do not resuscitate: Secondary | ICD-10-CM | POA: Diagnosis present

## 2019-07-20 DIAGNOSIS — Z955 Presence of coronary angioplasty implant and graft: Secondary | ICD-10-CM

## 2019-07-20 DIAGNOSIS — Z5329 Procedure and treatment not carried out because of patient's decision for other reasons: Secondary | ICD-10-CM | POA: Diagnosis not present

## 2019-07-20 DIAGNOSIS — Y712 Prosthetic and other implants, materials and accessory cardiovascular devices associated with adverse incidents: Secondary | ICD-10-CM | POA: Diagnosis present

## 2019-07-20 DIAGNOSIS — F17210 Nicotine dependence, cigarettes, uncomplicated: Secondary | ICD-10-CM | POA: Diagnosis present

## 2019-07-20 DIAGNOSIS — I10 Essential (primary) hypertension: Secondary | ICD-10-CM

## 2019-07-20 DIAGNOSIS — Z20828 Contact with and (suspected) exposure to other viral communicable diseases: Secondary | ICD-10-CM | POA: Diagnosis present

## 2019-07-20 DIAGNOSIS — T82855A Stenosis of coronary artery stent, initial encounter: Secondary | ICD-10-CM | POA: Diagnosis present

## 2019-07-20 DIAGNOSIS — E7800 Pure hypercholesterolemia, unspecified: Secondary | ICD-10-CM | POA: Diagnosis not present

## 2019-07-20 DIAGNOSIS — Z7951 Long term (current) use of inhaled steroids: Secondary | ICD-10-CM

## 2019-07-20 DIAGNOSIS — Z7982 Long term (current) use of aspirin: Secondary | ICD-10-CM | POA: Diagnosis not present

## 2019-07-20 DIAGNOSIS — Y831 Surgical operation with implant of artificial internal device as the cause of abnormal reaction of the patient, or of later complication, without mention of misadventure at the time of the procedure: Secondary | ICD-10-CM | POA: Diagnosis present

## 2019-07-20 DIAGNOSIS — I472 Ventricular tachycardia: Secondary | ICD-10-CM | POA: Diagnosis not present

## 2019-07-20 DIAGNOSIS — I25110 Atherosclerotic heart disease of native coronary artery with unstable angina pectoris: Secondary | ICD-10-CM | POA: Diagnosis present

## 2019-07-20 DIAGNOSIS — N183 Chronic kidney disease, stage 3 unspecified: Secondary | ICD-10-CM | POA: Diagnosis not present

## 2019-07-20 DIAGNOSIS — I6522 Occlusion and stenosis of left carotid artery: Secondary | ICD-10-CM | POA: Diagnosis present

## 2019-07-20 DIAGNOSIS — I200 Unstable angina: Secondary | ICD-10-CM | POA: Diagnosis not present

## 2019-07-20 DIAGNOSIS — J449 Chronic obstructive pulmonary disease, unspecified: Secondary | ICD-10-CM | POA: Diagnosis not present

## 2019-07-20 DIAGNOSIS — Z720 Tobacco use: Secondary | ICD-10-CM | POA: Diagnosis not present

## 2019-07-20 DIAGNOSIS — N179 Acute kidney failure, unspecified: Secondary | ICD-10-CM | POA: Diagnosis not present

## 2019-07-20 DIAGNOSIS — M19041 Primary osteoarthritis, right hand: Secondary | ICD-10-CM | POA: Diagnosis present

## 2019-07-20 DIAGNOSIS — I214 Non-ST elevation (NSTEMI) myocardial infarction: Principal | ICD-10-CM | POA: Diagnosis present

## 2019-07-20 HISTORY — DX: Hyperlipidemia, unspecified: E78.5

## 2019-07-20 HISTORY — DX: Tobacco use: Z72.0

## 2019-07-20 HISTORY — DX: Atherosclerotic heart disease of native coronary artery without angina pectoris: I25.10

## 2019-07-20 LAB — CBC
HCT: 49.6 % (ref 39.0–52.0)
Hemoglobin: 16.6 g/dL (ref 13.0–17.0)
MCH: 32.9 pg (ref 26.0–34.0)
MCHC: 33.5 g/dL (ref 30.0–36.0)
MCV: 98.4 fL (ref 80.0–100.0)
Platelets: 265 10*3/uL (ref 150–400)
RBC: 5.04 MIL/uL (ref 4.22–5.81)
RDW: 13.2 % (ref 11.5–15.5)
WBC: 14.2 10*3/uL — ABNORMAL HIGH (ref 4.0–10.5)
nRBC: 0 % (ref 0.0–0.2)

## 2019-07-20 LAB — HIV ANTIBODY (ROUTINE TESTING W REFLEX): HIV Screen 4th Generation wRfx: NONREACTIVE

## 2019-07-20 LAB — D-DIMER, QUANTITATIVE: D-Dimer, Quant: 0.39 ug/mL-FEU (ref 0.00–0.50)

## 2019-07-20 LAB — HEPATIC FUNCTION PANEL
ALT: 18 U/L (ref 0–44)
AST: 32 U/L (ref 15–41)
Albumin: 4.1 g/dL (ref 3.5–5.0)
Alkaline Phosphatase: 52 U/L (ref 38–126)
Bilirubin, Direct: 0.2 mg/dL (ref 0.0–0.2)
Indirect Bilirubin: 0.6 mg/dL (ref 0.3–0.9)
Total Bilirubin: 0.8 mg/dL (ref 0.3–1.2)
Total Protein: 7.6 g/dL (ref 6.5–8.1)

## 2019-07-20 LAB — BASIC METABOLIC PANEL
Anion gap: 19 — ABNORMAL HIGH (ref 5–15)
BUN: 14 mg/dL (ref 8–23)
CO2: 19 mmol/L — ABNORMAL LOW (ref 22–32)
Calcium: 8.8 mg/dL — ABNORMAL LOW (ref 8.9–10.3)
Chloride: 97 mmol/L — ABNORMAL LOW (ref 98–111)
Creatinine, Ser: 1.4 mg/dL — ABNORMAL HIGH (ref 0.61–1.24)
GFR calc Af Amer: 59 mL/min — ABNORMAL LOW (ref >60)
GFR calc non Af Amer: 51 mL/min — ABNORMAL LOW (ref >60)
Glucose, Bld: 90 mg/dL (ref 70–99)
Potassium: 4.7 mmol/L (ref 3.5–5.1)
Sodium: 135 mmol/L (ref 135–145)

## 2019-07-20 LAB — LIPASE, BLOOD: Lipase: 42 U/L (ref 11–51)

## 2019-07-20 LAB — MAGNESIUM: Magnesium: 2.1 mg/dL (ref 1.7–2.4)

## 2019-07-20 LAB — TROPONIN I (HIGH SENSITIVITY)
Troponin I (High Sensitivity): 84 ng/L — ABNORMAL HIGH (ref <18)
Troponin I (High Sensitivity): 441 ng/L (ref <18)

## 2019-07-20 LAB — PHOSPHORUS: Phosphorus: 3.1 mg/dL (ref 2.5–4.6)

## 2019-07-20 IMAGING — CR DG CHEST 2V
2 series · 2 of 2 positions shown · non-contrast
Comparison: Chest radiograph [DATE]

CLINICAL DATA: Pt reports central CP x 6 months

EXAM:
CHEST - 2 VIEW

[chest lat]
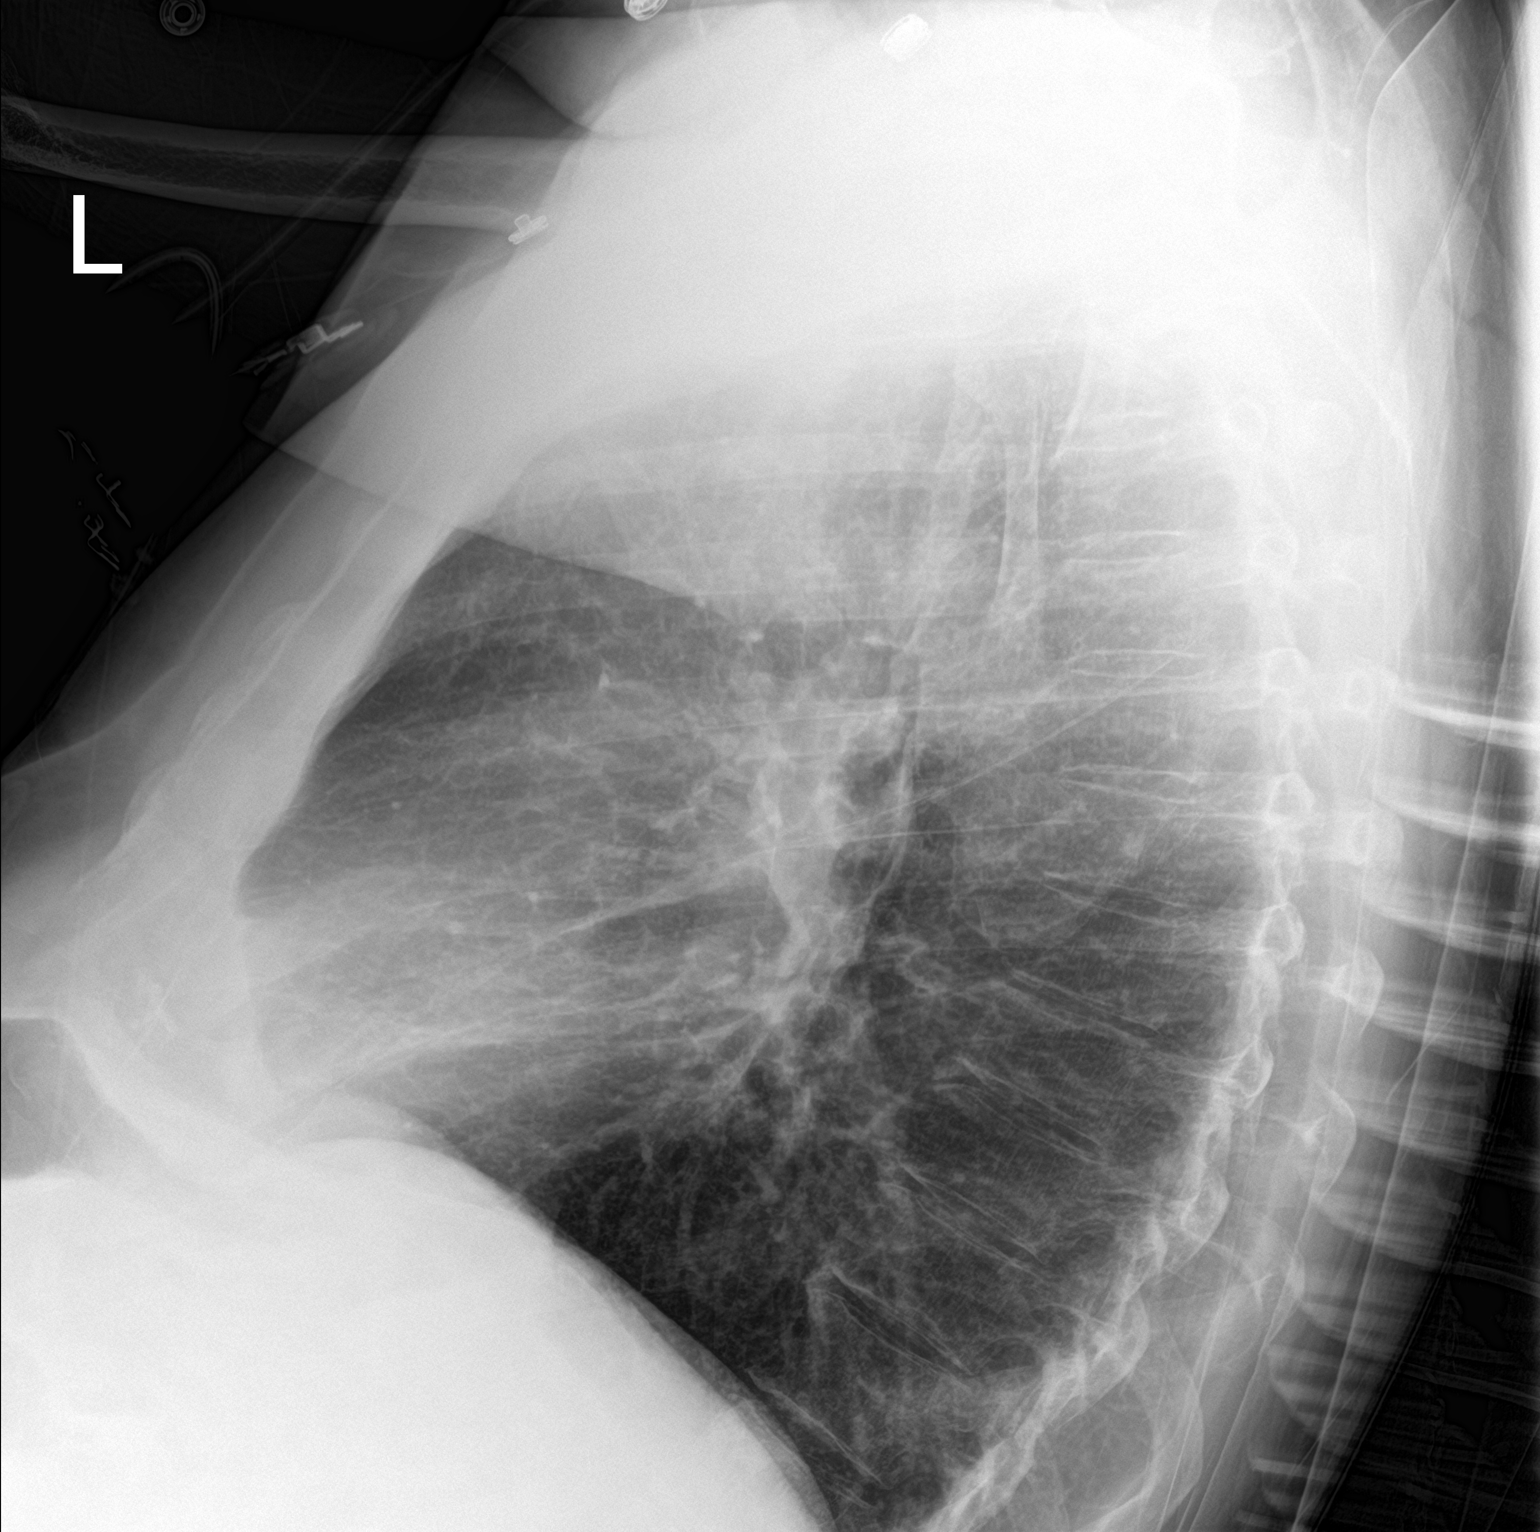

[chest ap]
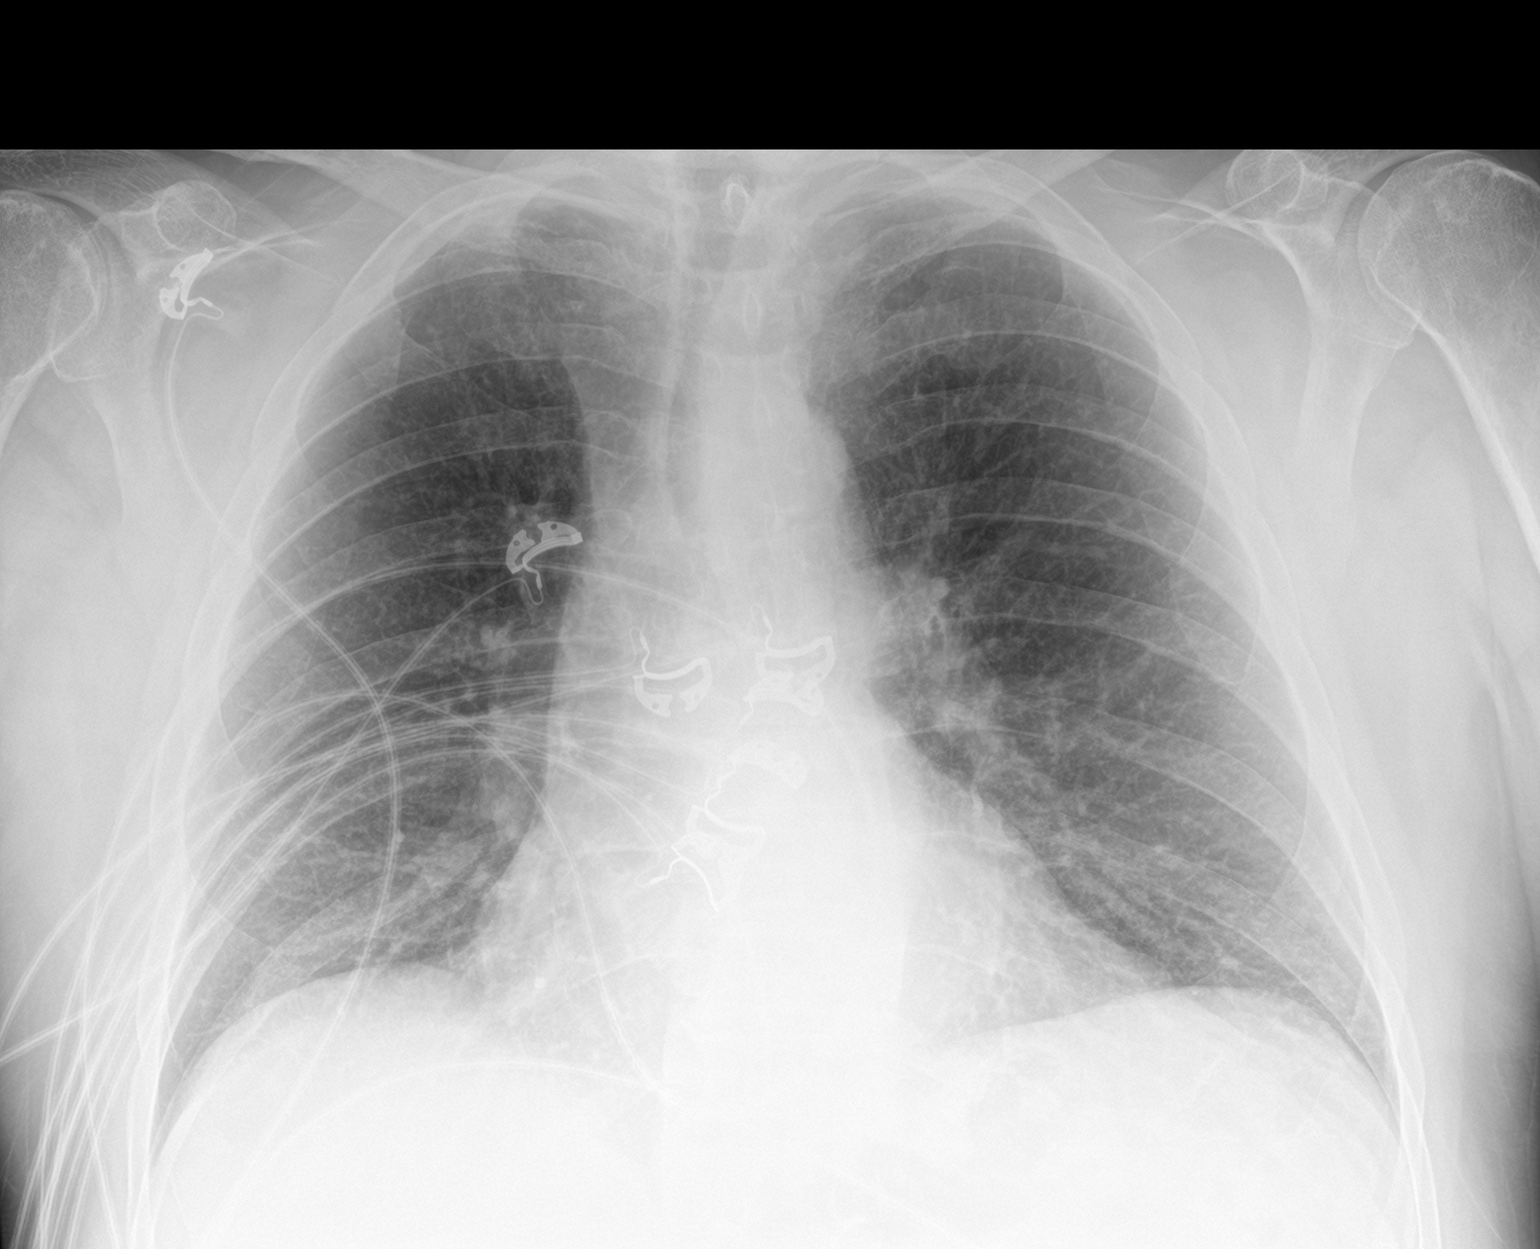

[2 of 2 positions shown; findings below may reference images not displayed]

FINDINGS: There is some narrowing in the mid trachea which may be positional.
Normal heart size. The lungs are clear. No pneumothorax or pleural
effusion. No acute finding in the visualized skeleton.
IMPRESSION: No active cardiopulmonary disease. There is some narrowing in the
mid trachea which may be positional.

## 2019-07-20 MED ORDER — LORAZEPAM 2 MG/ML IJ SOLN: 1.0000 mg | INTRAMUSCULAR | Status: AC | PRN

## 2019-07-20 MED ORDER — ADULT MULTIVITAMIN W/MINERALS CH
1.0000 | ORAL_TABLET | Freq: Every day | ORAL | Status: DC
  Administered 2019-07-21 – 2019-07-25 (×4): 1 via ORAL
  Filled 2019-07-20 (×4): qty 1

## 2019-07-20 MED ORDER — ALBUTEROL SULFATE HFA 108 (90 BASE) MCG/ACT IN AERS
2.0000 | INHALATION_SPRAY | Freq: Once | RESPIRATORY_TRACT | Status: AC
  Administered 2019-07-20: 2 via RESPIRATORY_TRACT
  Filled 2019-07-20: qty 6.7

## 2019-07-20 MED ORDER — SODIUM CHLORIDE 0.9% FLUSH
3.0000 mL | Freq: Two times a day (BID) | INTRAVENOUS | Status: DC
  Administered 2019-07-22: 09:00:00 3 mL via INTRAVENOUS

## 2019-07-20 MED ORDER — ONDANSETRON HCL 4 MG/2ML IJ SOLN: 4.0000 mg | Freq: Four times a day (QID) | INTRAMUSCULAR | Status: DC | PRN

## 2019-07-20 MED ORDER — VITAMIN B-1 100 MG PO TABS
100.0000 mg | ORAL_TABLET | Freq: Every day | ORAL | Status: DC
  Administered 2019-07-21 – 2019-07-25 (×4): 100 mg via ORAL
  Filled 2019-07-20 (×4): qty 1

## 2019-07-20 MED ORDER — METOPROLOL SUCCINATE ER 25 MG PO TB24
25.0000 mg | ORAL_TABLET | Freq: Every morning | ORAL | Status: DC
  Administered 2019-07-21 – 2019-07-25 (×5): 25 mg via ORAL
  Filled 2019-07-20 (×5): qty 1

## 2019-07-20 MED ORDER — ASPIRIN 81 MG PO CHEW
324.0000 mg | CHEWABLE_TABLET | Freq: Once | ORAL | Status: AC
  Administered 2019-07-20: 324 mg via ORAL
  Filled 2019-07-20: qty 4

## 2019-07-20 MED ORDER — RAMIPRIL 10 MG PO CAPS
10.0000 mg | ORAL_CAPSULE | Freq: Every morning | ORAL | Status: DC
  Administered 2019-07-21 – 2019-07-25 (×5): 10 mg via ORAL
  Filled 2019-07-20 (×5): qty 1

## 2019-07-20 MED ORDER — CLOPIDOGREL BISULFATE 75 MG PO TABS
75.0000 mg | ORAL_TABLET | Freq: Every morning | ORAL | Status: DC
  Administered 2019-07-21 – 2019-07-22 (×2): 75 mg via ORAL
  Filled 2019-07-20 (×2): qty 1

## 2019-07-20 MED ORDER — LORAZEPAM 1 MG PO TABS: 1.0000 mg | ORAL_TABLET | ORAL | Status: AC | PRN

## 2019-07-20 MED ORDER — NITROGLYCERIN 0.4 MG SL SUBL
0.4000 mg | SUBLINGUAL_TABLET | SUBLINGUAL | Status: DC | PRN
  Filled 2019-07-20: qty 1

## 2019-07-20 MED ORDER — NITROGLYCERIN 2 % TD OINT
1.0000 [in_us] | TOPICAL_OINTMENT | Freq: Once | TRANSDERMAL | Status: AC
  Administered 2019-07-20: 1 [in_us] via TOPICAL
  Filled 2019-07-20: qty 1

## 2019-07-20 MED ORDER — ALBUTEROL SULFATE (2.5 MG/3ML) 0.083% IN NEBU
3.0000 mL | INHALATION_SOLUTION | RESPIRATORY_TRACT | Status: DC | PRN
  Administered 2019-07-21: 3 mL via RESPIRATORY_TRACT

## 2019-07-20 MED ORDER — HEPARIN BOLUS VIA INFUSION
4000.0000 [IU] | Freq: Once | INTRAVENOUS | Status: AC
  Administered 2019-07-20: 4000 [IU] via INTRAVENOUS
  Filled 2019-07-20: qty 4000

## 2019-07-20 MED ORDER — SODIUM CHLORIDE 0.9% FLUSH: 3.0000 mL | INTRAVENOUS | Status: DC | PRN

## 2019-07-20 MED ORDER — FOLIC ACID 1 MG PO TABS
1.0000 mg | ORAL_TABLET | Freq: Every day | ORAL | Status: DC
  Administered 2019-07-21 – 2019-07-25 (×4): 1 mg via ORAL
  Filled 2019-07-20 (×4): qty 1

## 2019-07-20 MED ORDER — HEPARIN (PORCINE) 25000 UT/250ML-% IV SOLN
1100.0000 [IU]/h | INTRAVENOUS | Status: DC
  Administered 2019-07-20 – 2019-07-21 (×2): 1100 [IU]/h via INTRAVENOUS
  Filled 2019-07-20 (×2): qty 250

## 2019-07-20 MED ORDER — LORAZEPAM 1 MG PO TABS: 0.0000 mg | ORAL_TABLET | Freq: Four times a day (QID) | ORAL | Status: DC

## 2019-07-20 MED ORDER — SODIUM CHLORIDE 0.9 % IV SOLN: 250.0000 mL | INTRAVENOUS | Status: DC | PRN

## 2019-07-20 MED ORDER — MOMETASONE FURO-FORMOTEROL FUM 200-5 MCG/ACT IN AERO
2.0000 | INHALATION_SPRAY | Freq: Two times a day (BID) | RESPIRATORY_TRACT | Status: DC
  Administered 2019-07-21 – 2019-07-25 (×9): 2 via RESPIRATORY_TRACT
  Filled 2019-07-20: qty 8.8

## 2019-07-20 MED ORDER — ACETAMINOPHEN 325 MG PO TABS: 650.0000 mg | ORAL_TABLET | ORAL | Status: DC | PRN

## 2019-07-20 MED ORDER — THIAMINE HCL 100 MG/ML IJ SOLN: 100.0000 mg | Freq: Every day | INTRAMUSCULAR | Status: DC

## 2019-07-20 MED ORDER — ASPIRIN 81 MG PO CHEW: 243.0000 mg | CHEWABLE_TABLET | Freq: Once | ORAL | Status: DC

## 2019-07-20 MED ORDER — LORAZEPAM 1 MG PO TABS: 0.0000 mg | ORAL_TABLET | Freq: Two times a day (BID) | ORAL | Status: DC

## 2019-07-20 MED ORDER — IPRATROPIUM BROMIDE HFA 17 MCG/ACT IN AERS
2.0000 | INHALATION_SPRAY | Freq: Once | RESPIRATORY_TRACT | Status: AC
  Administered 2019-07-20: 2 via RESPIRATORY_TRACT
  Filled 2019-07-20: qty 12.9

## 2019-07-20 MED ORDER — GUAIFENESIN-DM 100-10 MG/5ML PO SYRP
15.0000 mL | ORAL_SOLUTION | ORAL | Status: DC | PRN
  Administered 2019-07-20 – 2019-07-21 (×2): 15 mL via ORAL
  Filled 2019-07-20 (×3): qty 15

## 2019-07-20 MED ORDER — METHYLPREDNISOLONE SODIUM SUCC 125 MG IJ SOLR
125.0000 mg | Freq: Once | INTRAMUSCULAR | Status: AC
  Administered 2019-07-20: 125 mg via INTRAVENOUS
  Filled 2019-07-20: qty 2

## 2019-07-20 MED ORDER — ASPIRIN EC 81 MG PO TBEC
81.0000 mg | DELAYED_RELEASE_TABLET | Freq: Every morning | ORAL | Status: DC
  Administered 2019-07-21 – 2019-07-25 (×4): 81 mg via ORAL
  Filled 2019-07-20 (×5): qty 1

## 2019-07-20 MED ORDER — NITROGLYCERIN 2 % TD OINT
1.0000 [in_us] | TOPICAL_OINTMENT | Freq: Four times a day (QID) | TRANSDERMAL | Status: DC
  Administered 2019-07-20 – 2019-07-22 (×4): 1 [in_us] via TOPICAL
  Filled 2019-07-20: qty 30

## 2019-07-20 NOTE — ED Notes (Signed)
Lab stated they would add on lipase, hepatic function panel and D-dimer to previously sent collection.

## 2019-07-20 NOTE — ED Provider Notes (Signed)
Care transferred to me.  Second troponin is significantly more elevated at 441.  Consistent with NSTEMI.  Patient is okay staying and would like to get his heart checked out.  He has minimal chest discomfort.  I discussed with Dr. Harl Bowie, will place on heparin and Nitropaste and cardiology will admit.   EKG Interpretation  Date/Time:  Saturday July 20 2019 15:38:55 EST Ventricular Rate:  61 PR Interval:    QRS Duration: 108 QT Interval:  423 QTC Calculation: 427 R Axis:   97 Text Interpretation: Sinus rhythm Ventricular premature complex Consider left atrial enlargement Right axis deviation Low voltage, precordial leads no significant change since earlier in the day Confirmed by Sherwood Gambler [phone]) on 07/20/2019 4:13:58 PM        CRITICAL CARE Performed by: Ephraim Hamburger   Total critical care time: 30 minutes  Critical care time was exclusive of separately billable procedures and treating other patients.  Critical care was necessary to treat or prevent imminent or life-threatening deterioration.  Critical care was time spent personally by me on the following activities: development of treatment plan with patient and/or surrogate as well as nursing, discussions with consultants, evaluation of patient's response to treatment, examination of patient, obtaining history from patient or surrogate, ordering and performing treatments and interventions, ordering and review of laboratory studies, ordering and review of radiographic studies, pulse oximetry and re-evaluation of patient's condition.    Sherwood Gambler, MD 07/20/19 [phone]

## 2019-07-20 NOTE — Progress Notes (Signed)
ANTICOAGULATION CONSULT NOTE - Initial Consult  Pharmacy Consult for heparin Indication: chest pain/ACS   Patient Measurements: Height: 5\' 8"  (172.7 cm) Weight: 210 lb (95.3 kg) IBW/kg (Calculated) : 68.4 Heparin Dosing Weight: 88.4 kg  Vital Signs: Temp: 98.1 F (36.7 C) (11/07 1137) Temp Source: Oral (11/07 1137) BP: 128/75 (11/07 1630) Pulse Rate: 83 (11/07 1630)  Labs: Recent Labs    07/20/19 1134 07/20/19 1441  HGB 16.6  --   HCT 49.6  --   PLT 265  --   CREATININE 1.40*  --   TROPONINIHS 84* 441*     Medical History: Past Medical History:  Diagnosis Date  . Arthritis    hands   . Chronic bronchitis ([place])   . Heart attack ([place])   . Hypertension   . Myocardial infarction ([place])   . Shortness of breath    with exertion      Assessment: 70 male with chest pain. 2nd troponin is elevated. Planning to start heparin gtt for NSTEMI while cardiology workup is being completed. H/h plts wnl, SCr 1.4. No anticoagulation prior to admission.   Goal of Therapy:  Heparin level 0.3-0.7 units/ml Monitor platelets by anticoagulation protocol: Yes    Plan:  -Heparin bolus 4000 units x1 then 1100 units/hr -Daily HL, CBC -First level with AM labs   Kimara Bencomo, Jake Church 07/20/2019,4:40 PM

## 2019-07-20 NOTE — ED Notes (Signed)
Pt placed on 2L [place] for comfort.

## 2019-07-20 NOTE — ED Triage Notes (Signed)
Pt reports central CP x 6 months, states he has not seen his doctor for it.  States he drank a pint of vodka this morning to "kill the pain". States he drinks daily to help the pain.

## 2019-07-20 NOTE — ED Notes (Addendum)
Pt very insistent that he is going to sit on the side of the bed, this RN explained that it isn't safe for him to do so after he drank a pint of alcohol this morning. Pt yelled at this Rn that he didn't care and that this RN needed to stop being argumentative.  This RN explained that the bed may try to flip with him sitting on the end, pt stated he does not care.  Pt refusing to keep all VS monitoring on. Will continue to monitor.

## 2019-07-20 NOTE — ED Notes (Signed)
Consulting cardiologist at bed side.

## 2019-07-20 NOTE — ED Notes (Signed)
MD Tegeler notified of troponin 441.

## 2019-07-20 NOTE — H&P (Signed)
Cardiology H&P:   Patient ID: Jonathan [name] MRN: [id number]; DOB: 1951/04/04  Admit date: 07/20/2019 Date of Consult: 07/20/2019  Primary Care Provider: Leonard Downing, MD Primary Cardiologist: Dr Claiborne Billings  CC: CP  Patient Profile:   Jonathan [name] is a 68 y.o. male with a hx of known CAD s/p PCI in 2005 with HTN, HL, and ongoing tobacco use who is being seen today for the evaluation of chest pain at the request of Dr Regenia Skeeter.  History of Present Illness:   Jonathan [name] presents today for further evaluation of chest pain.  He is a rather poor historian.  He reports that he has had episodic chest pain since January.  He is not clear as to whether episodes are exertional.  He does have occasional chest wall pain associated with cough.  Recently, he has found his non cough related chest pain to be "severe".  He has been drinking alcohol to treat his pain but has not sought medical attention and has not seen Dr Claiborne Billings recently.  He continues to smoke.  He has not been willing to consider Repatha as advised by Dr Claiborne Billings.  He underwent PCI in 2005 in the setting of Acute MI but has not had substantial cardiac issues since that time.  Heart Pathway Score:     Past Medical History:  Diagnosis Date  . Arthritis    hands   . Chronic bronchitis ([place])   . Hyperlipidemia    has not tolerated statins,  would not pursue PSK9 inhibitor as advised by Dr Claiborne Billings  . Hypertension   . Myocardial infarction ([place])   . Tobacco abuse     Past Surgical History:  Procedure Laterality Date  . CARDIAC CATHETERIZATION    . CARDIOVASCULAR STRESS TEST  11/11/2011  . CORONARY ANGIOPLASTY     stents- 01/2004   . heart stents  01/2004  . INGUINAL HERNIA REPAIR Right 12/06/2012   Procedure: HERNIA REPAIR INGUINAL ADULT;  Surgeon: [name], DO;  Location: [facility];  Service: General;  Laterality: Right;  . INSERTION OF MESH Right 12/06/2012   Procedure: INSERTION OF MESH;  Surgeon: [name], DO;  Location: [facility]  [facility];  Service: General;  Laterality: Right;     Home Medications:  Prior to Admission medications   Medication Sig Start Date End Date Taking? Authorizing Provider  albuterol (PROVENTIL HFA;VENTOLIN HFA) 108 (90 Base) MCG/ACT inhaler Inhale 2 puffs into the lungs every 4 (four) hours as needed for wheezing or shortness of breath (cough, shortness of breath or wheezing.). Patient taking differently: Inhale 2 puffs into the lungs every 4 (four) hours as needed for wheezing or shortness of breath (cough).  10/24/18  Yes Tanda Rockers, MD  aspirin EC 81 MG tablet Take 81 mg by mouth every morning.   Yes [provider]  budesonide-formoterol (SYMBICORT) 160-4.5 MCG/ACT inhaler Inhale 2 puffs into the lungs 2 (two) times daily. Patient taking differently: Inhale 2 puffs into the lungs 2 (two) times daily as needed (shortness of breath/wheezing).  06/08/18  Yes Tanda Rockers, MD  clopidogrel (PLAVIX) 75 MG tablet TAKE 1 TABLET BY MOUTH  DAILY Patient taking differently: Take 75 mg by mouth every morning.  04/11/19  Yes [name], MD  metoprolol succinate (TOPROL-XL) 25 MG 24 hr tablet TAKE 1 TABLET BY MOUTH  DAILY Patient taking differently: Take 25 mg by mouth every morning.  04/11/19  Yes [name], MD  nitroGLYCERIN (NITROSTAT) 0.4 MG SL tablet  Place 1 tablet (0.4 mg total) under the tongue every 5 (five) minutes as needed for chest pain. 03/06/19 03/05/20 Yes Kilroy, Luke K, PA-C  ramipril (ALTACE) 10 MG capsule TAKE 1 CAPSULE BY MOUTH  DAILY Patient taking differently: Take 10 mg by mouth every morning.  04/11/19  Yes [name], MD  budesonide-formoterol [facility]) 160-4.5 MCG/ACT inhaler Inhale 2 puffs into the lungs 2 (two) times daily. Patient not taking: Reported on 07/20/2019 06/08/18   Tanda Rockers, MD  Omega-3 Fatty Acids (FISH OIL) 1000 MG CAPS Take 2 capsules (2,000 mg total) by mouth daily. Patient not taking: Reported on 07/20/2019 08/22/14   [name],  MD  valsartan (DIOVAN) 160 MG tablet Take 1 tablet (160 mg total) by mouth daily. Patient not taking: Reported on 07/20/2019 04/16/18   Tanda Rockers, MD   Allergies:    Allergies  Allergen Reactions  . Niaspan [Niacin Er] Itching and Other (See Comments)    insomnia    Social History:   Social History   Socioeconomic History  . Marital status: Married    Spouse name: Not on file  . Number of children: Not on file  . Years of education: Not on file  . Highest education level: Not on file  Occupational History  . Not on file  Social Needs  . Financial resource strain: Not on file  . Food insecurity    Worry: Not on file    Inability: Not on file  . Transportation needs    Medical: Not on file    Non-medical: Not on file  Tobacco Use  . Smoking status: Current Every Day Smoker    Packs/day: 1.00    Years: 53.00    Pack years: 53.00    Types: Cigarettes  . Smokeless tobacco: Never Used  Substance and Sexual Activity  . Alcohol use: Yes    Alcohol/week: 5.0 standard drinks    Types: 5 Standard drinks or equivalent per week    Comment: consumes 1 pint per day   . Drug use: No  . Sexual activity: Yes  Lifestyle  . Physical activity    Days per week: Not on file    Minutes per session: Not on file  . Stress: Not on file  Relationships  . Social Herbalist on phone: Not on file    Gets together: Not on file    Attends religious service: Not on file    Active member of club or organization: Not on file    Attends meetings of clubs or organizations: Not on file    Relationship status: Not on file  . Intimate partner violence    Fear of current or ex partner: Not on file    Emotionally abused: Not on file    Physically abused: Not on file    Forced sexual activity: Not on file  Other Topics Concern  . Not on file  Social History Narrative   Retired   Lives in [place]      Wishes to be DNR    Family History:   + HTN  ROS:  Please see the  history of present illness.  He is clear that he wishes to be DNI/DNR All other ROS reviewed and negative.     Physical Exam/Data:   Vitals:   07/20/19 1500 07/20/19 1538 07/20/19 1600 07/20/19 1630  BP: 135/68 (!) 125/100 (!) 143/77 128/75  Pulse: 60 66 69 83  Resp: 18 (!)  23 20 20   Temp:      TempSrc:      SpO2: 99% (!) 89% 95% 95%  Weight:      Height:       No intake or output data in the 24 hours ending 07/20/19 1658 Last 3 Weights 07/20/2019 06/08/2018 04/16/2018  Weight (lbs) 210 lb 215 lb 216 lb  Weight (kg) 95.255 kg 97.523 kg 97.977 kg     Body mass index is 31.93 kg/m.  General:  Dissheveled,  Psychomotor agitated,  Does not appear drunk but reports drinking a "pint" of alcohol this am.  + frequent cough HEENT: normal Neck: no JVD Cardiac:  RRR, not further examined due to covid 19 social distancing Lungs:  Normal work of breathing,  Frequent cough not further examined due to covid 19 social distancing Abd: soft,  Ext: no edema Musculoskeletal:  No deformities, BUE and BLE strength normal and equal Skin: warm and dry  Neuro:  Strength and sensation intact, no focal abnormalities noted Psych:  + psychomotor agitated with frequent rocking motion,  Difficult historian  EKG:  The EKG was personally reviewed and demonstrates:  Sinus rhythm with PVCs, no ischemic changes Telemetry:  Telemetry was personally reviewed and demonstrates:  sinus   Laboratory Data:  High Sensitivity Troponin:   Recent Labs  Lab 07/20/19 1134 07/20/19 1441  TROPONINIHS 84* 441*     Chemistry Recent Labs  Lab 07/20/19 1134  NA 135  K 4.7  CL 97*  CO2 19*  GLUCOSE 90  BUN 14  CREATININE 1.40*  CALCIUM 8.8*  GFRNONAA 51*  GFRAA 59*  ANIONGAP 19*    Recent Labs  Lab 07/20/19 1134  PROT 7.6  ALBUMIN 4.1  AST 32  ALT 18  ALKPHOS 52  BILITOT 0.8   Hematology Recent Labs  Lab 07/20/19 1134  WBC 14.2*  RBC 5.04  HGB 16.6  HCT 49.6  MCV 98.4  MCH 32.9  MCHC 33.5   RDW 13.2  PLT 265   BNPNo results for input(s): BNP, PROBNP in the last 168 hours.  DDimer  Recent Labs  Lab 07/20/19 1134  DDIMER 0.39     Radiology/Studies:  Dg Chest 2 View  Result Date: 07/20/2019 CLINICAL DATA:  Pt reports central CP x 6 months EXAM: CHEST - 2 VIEW COMPARISON:  Chest radiograph 04/16/2018 FINDINGS: There is some narrowing in the mid trachea which may be positional. Normal heart size. The lungs are clear. No pneumothorax or pleural effusion. No acute finding in the visualized skeleton. IMPRESSION: No active cardiopulmonary disease. There is some narrowing in the mid trachea which may be positional. Electronically Signed   By: Audie Pinto M.D.   On: 07/20/2019 12:47    Assessment and Plan:   1. Chest pain Both typical and atypical features Concerning for ACS He is a poor historian with known CAD, ongoing tobacco, and untreated hyperlipidemia. We will admit to the cardiology service for further evaluation and management. He is on ASA/ plavix chronically which will be continued Nitropaste/ cycle CMs Echo Will likely proceed with cath on Monday He declines statin  2. Known CAD As above  3. HTN Stable No change required today  4. Tobacco Cessation advised  5. ETOH Watch for s/sx of withdrawal  6. Code status He is clear that he would like to be DNI/DNR We will respect his wishes.  For questions or updates, please contact [place] Please consult [email or web address] for contact info under  Signed, Thompson Grayer, MD  07/20/2019 4:58 PM

## 2019-07-20 NOTE — Progress Notes (Signed)
Patient complaining of right side, achy/sharp chest pain while coughing.  RN to get PRN medications for cough and chest pain for patient.  When RN returned patient stated pain had resolved.  PRN Robitussin administered.   Will continue to monitor.

## 2019-07-20 NOTE — ED Notes (Signed)
Patient transported to X-ray 

## 2019-07-20 NOTE — ED Provider Notes (Signed)
[facility] EMERGENCY DEPARTMENT Provider Note   CSN: [id number] Arrival date & time: 07/20/19  1121     History   Chief Complaint Chief Complaint  Patient presents with  . Chest Pain    HPI Jonathan [name] is a 68 y.o. male.     The history is provided by the patient and medical records. No language interpreter was used.  Chest Pain Pain location:  Substernal area, L chest and R chest Pain quality: aching and burning   Pain radiates to:  Does not radiate Pain severity:  Moderate Onset quality:  Gradual Duration:  6 months Timing:  Intermittent Progression:  Waxing and waning Chronicity:  Chronic Relieved by: alcohol. Worsened by:  Nothing Ineffective treatments:  None tried Associated symptoms: cough (in the AM), heartburn and shortness of breath   Associated symptoms: no abdominal pain, no back pain, no claudication, no diaphoresis, no dizziness, no fatigue, no fever, no headache, no lower extremity edema, no nausea, no numbness, no palpitations and no vomiting   Risk factors: coronary artery disease, hypertension and male sex     Past Medical History:  Diagnosis Date  . Arthritis    hands   . Chronic bronchitis ([place])   . Heart attack ([place])   . Hypertension   . Myocardial infarction ([place])   . Shortness of breath    with exertion     Patient Active Problem List   Diagnosis Date Noted  . Cigarette smoker 04/17/2018  . DOE (dyspnea on exertion) 04/16/2018  . Chronic sinusitis of both maxillary sinuses 04/16/2018  . COPD GOLD II   active smoker 04/16/2018  . Obesity (BMI 30.0-34.9) 12/16/2015  . Tobacco abuse counseling 08/05/2014  . 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 Essential hypertension, benign 09/23/2012  . Heart attack ([place]) 09/23/2012  . Hyperlipidemia 09/23/2012  . Hernia, inguinal 09/23/2012    Past Surgical History:  Procedure Laterality Date  . CARDIAC CATHETERIZATION    . CARDIOVASCULAR STRESS TEST  11/11/2011  .  CORONARY ANGIOPLASTY     stents- 01/2004   . heart stents  01/2004  . INGUINAL HERNIA REPAIR Right 12/06/2012   Procedure: HERNIA REPAIR INGUINAL ADULT;  Surgeon: [name], DO;  Location: [facility];  Service: General;  Laterality: Right;  . INSERTION OF MESH Right 12/06/2012   Procedure: INSERTION OF MESH;  Surgeon: [name], DO;  Location: [facility];  Service: General;  Laterality: Right;        Home Medications    Prior to Admission medications   Medication Sig Start Date End Date Taking? Authorizing Provider  albuterol (PROVENTIL HFA;VENTOLIN HFA) 108 (90 Base) MCG/ACT inhaler Inhale 2 puffs into the lungs every 4 (four) hours as needed for wheezing or shortness of breath (cough, shortness of breath or wheezing.). 10/24/18   Tanda Rockers, MD  aspirin 81 MG tablet Take 81 mg by mouth daily.    [provider]  budesonide-formoterol (SYMBICORT) 160-4.5 MCG/ACT inhaler Inhale 2 puffs into the lungs 2 (two) times daily. 06/08/18   Tanda Rockers, MD  budesonide-formoterol (SYMBICORT) 160-4.5 MCG/ACT inhaler Inhale 2 puffs into the lungs 2 (two) times daily. 06/08/18   Tanda Rockers, MD  clopidogrel (PLAVIX) 75 MG tablet TAKE 1 TABLET BY MOUTH  DAILY 04/11/19   [name], MD  metoprolol succinate (TOPROL-XL) 25 MG 24 hr tablet TAKE 1 TABLET BY MOUTH  DAILY 04/11/19   [name], MD  nitroGLYCERIN (NITROSTAT) 0.4 MG SL tablet  Place 1 tablet (0.4 mg total) under the tongue every 5 (five) minutes as needed for chest pain. 03/06/19 03/05/20  Erlene Quan, PA-C  Omega-3 Fatty Acids (FISH OIL) 1000 MG CAPS Take 2 capsules (2,000 mg total) by mouth daily. 08/22/14   [name], MD  ramipril (ALTACE) 10 MG capsule TAKE 1 CAPSULE BY MOUTH  DAILY 04/11/19   [name], MD  valsartan (DIOVAN) 160 MG tablet Take 1 tablet (160 mg total) by mouth daily. 04/16/18   Tanda Rockers, MD    Family History History reviewed. No pertinent family history.  Social History Social  History   Tobacco Use  . Smoking status: Current Every Day Smoker    Packs/day: 1.00    Years: 53.00    Pack years: 53.00    Types: Cigarettes  . Smokeless tobacco: Never Used  Substance Use Topics  . Alcohol use: Yes    Alcohol/week: 5.0 standard drinks    Types: 5 Standard drinks or equivalent per week    Comment: consumes 1 pint per day   . Drug use: No     Allergies   Niaspan [niacin er]   Review of Systems Review of Systems  Constitutional: Negative for chills, diaphoresis, fatigue and fever.  HENT: Negative for congestion and rhinorrhea.   Eyes: Negative for visual disturbance.  Respiratory: Positive for cough (in the AM) and shortness of breath. Negative for chest tightness and wheezing.   Cardiovascular: Positive for chest pain. Negative for palpitations and claudication.  Gastrointestinal: Positive for heartburn. Negative for abdominal pain, constipation, diarrhea, nausea and vomiting.  Genitourinary: Negative for dysuria, flank pain and frequency.  Musculoskeletal: Negative for back pain, neck pain and neck stiffness.  Skin: Negative for rash and wound.  Neurological: Negative for dizziness, light-headedness, numbness and headaches.  Psychiatric/Behavioral: Negative for agitation and confusion.  All other systems reviewed and are negative.    Physical Exam Updated Vital Signs BP 119/77 (BP Location: Right Arm)   Pulse 84   Temp 98.1 F (36.7 C) (Oral)   Resp (!) 29   Ht 5\' 8"  (1.727 m)   Wt 95.3 kg   SpO2 94%   BMI 31.93 kg/m   Physical Exam Vitals signs and nursing note reviewed.  Constitutional:      General: He is not in acute distress.    Appearance: He is well-developed. He is not ill-appearing, toxic-appearing or diaphoretic.  HENT:     Head: Normocephalic and atraumatic.  Eyes:     Conjunctiva/sclera: Conjunctivae normal.     Pupils: Pupils are equal, round, and reactive to light.  Neck:     Musculoskeletal: Neck supple.   Cardiovascular:     Rate and Rhythm: Normal rate and regular rhythm.     Heart sounds: Normal heart sounds. No murmur. No systolic murmur.  Pulmonary:     Effort: Pulmonary effort is normal. No respiratory distress.     Breath sounds: Wheezing present. No decreased breath sounds, rhonchi or rales.  Abdominal:     Palpations: Abdomen is soft.     Tenderness: There is no abdominal tenderness.  Musculoskeletal: Normal range of motion.     Right lower leg: He exhibits no tenderness. No edema.     Left lower leg: He exhibits no tenderness. No edema.  Skin:    General: Skin is warm and dry.     Capillary Refill: Capillary refill takes less than 2 seconds.     Findings: No erythema.  Neurological:  General: No focal deficit present.     Mental Status: He is alert.      ED Treatments / Results  Labs (all labs ordered are listed, but only abnormal results are displayed) Labs Reviewed  BASIC METABOLIC PANEL - Abnormal; Notable for the following components:      Result Value   Chloride 97 (*)    CO2 19 (*)    Creatinine, Ser 1.40 (*)    Calcium 8.8 (*)    GFR calc non Af Amer 51 (*)    GFR calc Af Amer 59 (*)    Anion gap 19 (*)    All other components within normal limits  CBC - Abnormal; Notable for the following components:   WBC 14.2 (*)    All other components within normal limits  TROPONIN I (HIGH SENSITIVITY) - Abnormal; Notable for the following components:   Troponin I (High Sensitivity) 84 (*)    All other components within normal limits  TROPONIN I (HIGH SENSITIVITY) - Abnormal; Notable for the following components:   Troponin I (High Sensitivity) 441 (*)    All other components within normal limits  LIPASE, BLOOD  HEPATIC FUNCTION PANEL  D-DIMER, QUANTITATIVE (NOT AT [facility])    EKG EKG Interpretation  Date/Time:  Saturday July 20 2019 15:38:55 EST Ventricular Rate:  61 PR Interval:    QRS Duration: 108 QT Interval:  423 QTC Calculation: 427 R Axis:    97 Text Interpretation: Sinus rhythm Ventricular premature complex Consider left atrial enlargement Right axis deviation Low voltage, precordial leads no significant change since earlier in the day Confirmed by Sherwood Gambler [phone]) on 07/20/2019 4:13:58 PM   Radiology Dg Chest 2 View  Result Date: 07/20/2019 CLINICAL DATA:  Pt reports central CP x 6 months EXAM: CHEST - 2 VIEW COMPARISON:  Chest radiograph 04/16/2018 FINDINGS: There is some narrowing in the mid trachea which may be positional. Normal heart size. The lungs are clear. No pneumothorax or pleural effusion. No acute finding in the visualized skeleton. IMPRESSION: No active cardiopulmonary disease. There is some narrowing in the mid trachea which may be positional. Electronically Signed   By: Audie Pinto M.D.   On: 07/20/2019 12:47    Procedures Procedures (including critical care time)  CRITICAL CARE Performed by: Gwenyth Allegra Tajah Noguchi Total critical care time: 35 minutes Critical care time was exclusive of separately billable procedures and treating other patients. Chest pain with rising troponin for admission to cardiology. Critical care was necessary to treat or prevent imminent or life-threatening deterioration. Critical care was time spent personally by me on the following activities: development of treatment plan with patient and/or surrogate as well as nursing, discussions with consultants, evaluation of patient's response to treatment, examination of patient, obtaining history from patient or surrogate, ordering and performing treatments and interventions, ordering and review of laboratory studies, ordering and review of radiographic studies, pulse oximetry and re-evaluation of patient's condition.   Medications Ordered in ED Medications  aspirin chewable tablet 324 mg (has no administration in time range)  nitroGLYCERIN (NITROGLYN) 2 % ointment 1 inch (has no administration in time range)  albuterol (VENTOLIN  HFA) 108 (90 Base) MCG/ACT inhaler 2 puff (2 puffs Inhalation Given 07/20/19 1253)  ipratropium (ATROVENT HFA) inhaler 2 puff (2 puffs Inhalation Given 07/20/19 1441)  methylPREDNISolone sodium succinate (SOLU-MEDROL) 125 mg/2 mL injection 125 mg (125 mg Intravenous Given 07/20/19 1253)     Initial Impression / Assessment and Plan / ED Course  I have reviewed  the triage vital signs and the nursing notes.  Pertinent labs & imaging results that were available during my care of the patient were reviewed by me and considered in my medical decision making (see chart for details).        Jonathan [name] is a 68 y.o. male with a past medical history significant for CAD with prior PCI, hypertension, hyperlipidemia, and COPD not on home oxygen who presents with chest pain and shortness of breath.  Patient reports that he has had chest discomfort for the last 6 months and he has not seen any physicians for it.  He says he is finally concerned.  He reports that he drinks a pint of alcohol every day to try to help with the discomfort.  He reports his usual in the mornings and then improves.  He reports it is a burning and aching pain.  He reports that it is better after the alcohol.  He reports the pain is moderate to severe.  He is also concerned about his drinking but he is not sure and assistance with the drinking or alcohol at this time.  He denies any history of alcohol withdrawal seizures.  He denies trauma.  He reports cough in the mornings but no current cough.  He reports the symptoms are not exertional or specifically pleuritic.  He denies history of DVT or PE.  On exam, chest is nontender.  Lungs have wheezing in all lung fields.  He reports he was on an inhaler previously but did not work.  He had nontender abdomen.  Good pulses in extremities.  No significant edema seen in legs.  No focal neurologic deficits.  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  The burning discomfort and being in the mornings does likely reflect GERD or gastritis type pain however, given his history of MI, the shortness of breath, and his age, will work-up for possible cardiac versus pulmonary etiology of symptoms.  With the wheezing, will give albuterol, Atrovent, and steroids.  Suspect component of COPD exacerbation also ongoing.  If work-up is complete reassuring, dissipate discharge with instructions for PCP reengage in follow-up as well as steroids for COPD exacerbation.  Anticipate reassessment after work-up.  3:44 PM Patient's D-dimer was negative, doubt PE.  Patient's creatinine is slightly elevated at 1.4 but potassium is normal.  Mild leukocytosis but normal hemoglobin.  Hepatic function normal.  Lipase not elevated.  Chest x-ray shows no active cardiopulmonary disease.  Initial troponin in the 80s.  Second troponin returned elevated at 441.   Second EKG does not show STEMI.  Patient reports his breathing was feeling better after Solu-Medrol and breathing treatments.   We will call cardiology and patient will likely require admission for potential NSTEMI.  Patient will be given aspirin.  Will speak with cardiology about heparinization and further management.  They will also likely need to closely monitor the patient's alcohol withdrawal symptoms as he reports that he has never had a seizure from alcohol withdrawals but he is also never really tried to quit drinking.     Final Clinical Impressions(s) / ED Diagnoses   Final diagnoses:  NSTEMI (non-ST elevated myocardial infarction) ([place])     Clinical Impression: 1. NSTEMI (non-ST elevated myocardial infarction) [facility])     Disposition: Admit  This note was prepared with assistance of Dragon voice recognition software. Occasional wrong-word or sound-a-like substitutions may have occurred due to the  inherent limitations of voice recognition software.       Maryanne Huneycutt, Gwenyth Allegra, MD 07/20/19 [phone]

## 2019-07-21 ENCOUNTER — Inpatient Hospital Stay (HOSPITAL_COMMUNITY): Payer: Medicare Other

## 2019-07-21 DIAGNOSIS — R079 Chest pain, unspecified: Secondary | ICD-10-CM

## 2019-07-21 DIAGNOSIS — I214 Non-ST elevation (NSTEMI) myocardial infarction: Principal | ICD-10-CM

## 2019-07-21 LAB — HEPARIN LEVEL (UNFRACTIONATED)
Heparin Unfractionated: 0.44 IU/mL (ref 0.30–0.70)
Heparin Unfractionated: <0.1 IU/mL — ABNORMAL LOW (ref 0.30–0.70)
Heparin Unfractionated: 0.46 IU/mL (ref 0.30–0.70)

## 2019-07-21 LAB — LIPID PANEL
Cholesterol: 288 mg/dL — ABNORMAL HIGH (ref 0–200)
HDL: 76 mg/dL (ref >40)
LDL Cholesterol: 196 mg/dL — ABNORMAL HIGH (ref 0–99)
Total CHOL/HDL Ratio: 3.8 RATIO
Triglycerides: 79 mg/dL (ref <150)
VLDL: 16 mg/dL (ref 0–40)

## 2019-07-21 LAB — BASIC METABOLIC PANEL
Anion gap: 15 (ref 5–15)
BUN: 23 mg/dL (ref 8–23)
CO2: 23 mmol/L (ref 22–32)
Calcium: 9 mg/dL (ref 8.9–10.3)
Chloride: 95 mmol/L — ABNORMAL LOW (ref 98–111)
Creatinine, Ser: 1.59 mg/dL — ABNORMAL HIGH (ref 0.61–1.24)
GFR calc Af Amer: 50 mL/min — ABNORMAL LOW (ref >60)
GFR calc non Af Amer: 43 mL/min — ABNORMAL LOW (ref >60)
Glucose, Bld: 139 mg/dL — ABNORMAL HIGH (ref 70–99)
Potassium: 4.5 mmol/L (ref 3.5–5.1)
Sodium: 133 mmol/L — ABNORMAL LOW (ref 135–145)

## 2019-07-21 LAB — CBC
HCT: 47.2 % (ref 39.0–52.0)
Hemoglobin: 15.8 g/dL (ref 13.0–17.0)
MCH: 32.5 pg (ref 26.0–34.0)
MCHC: 33.5 g/dL (ref 30.0–36.0)
MCV: 97.1 fL (ref 80.0–100.0)
Platelets: 254 10*3/uL (ref 150–400)
RBC: 4.86 MIL/uL (ref 4.22–5.81)
RDW: 13.1 % (ref 11.5–15.5)
WBC: 11 10*3/uL — ABNORMAL HIGH (ref 4.0–10.5)
nRBC: 0 % (ref 0.0–0.2)

## 2019-07-21 LAB — ECHOCARDIOGRAM COMPLETE
Height: 68 in
Weight: 3331.2 oz

## 2019-07-21 LAB — SARS CORONAVIRUS 2 (TAT 6-24 HRS): SARS Coronavirus 2: NEGATIVE

## 2019-07-21 LAB — TROPONIN I (HIGH SENSITIVITY): Troponin I (High Sensitivity): 329 ng/L (ref <18)

## 2019-07-21 MED ORDER — SODIUM CHLORIDE 0.9 % IV SOLN: 250.0000 mL | INTRAVENOUS | Status: DC | PRN

## 2019-07-21 MED ORDER — ASPIRIN 81 MG PO CHEW
81.0000 mg | CHEWABLE_TABLET | ORAL | Status: AC
  Administered 2019-07-22: 81 mg via ORAL
  Filled 2019-07-21: qty 1

## 2019-07-21 MED ORDER — SODIUM CHLORIDE 0.9 % IV SOLN
INTRAVENOUS | Status: AC
  Administered 2019-07-21: 11:00:00 via INTRAVENOUS

## 2019-07-21 MED ORDER — OXYCODONE-ACETAMINOPHEN 5-325 MG PO TABS
1.0000 | ORAL_TABLET | Freq: Four times a day (QID) | ORAL | Status: DC | PRN
  Filled 2019-07-21: qty 1

## 2019-07-21 MED ORDER — SODIUM CHLORIDE 0.9% FLUSH: 3.0000 mL | Freq: Two times a day (BID) | INTRAVENOUS | Status: DC

## 2019-07-21 MED ORDER — PERFLUTREN LIPID MICROSPHERE
1.0000 mL | INTRAVENOUS | Status: AC | PRN
  Filled 2019-07-21: qty 10

## 2019-07-21 MED ORDER — HEPARIN BOLUS VIA INFUSION
4000.0000 [IU] | Freq: Once | INTRAVENOUS | Status: AC
  Administered 2019-07-21: 4000 [IU] via INTRAVENOUS
  Filled 2019-07-21: qty 4000

## 2019-07-21 MED ORDER — MORPHINE SULFATE (PF) 2 MG/ML IV SOLN: 2.0000 mg | INTRAVENOUS | Status: DC | PRN

## 2019-07-21 MED ORDER — SODIUM CHLORIDE 0.9 % WEIGHT BASED INFUSION
3.0000 mL/kg/h | INTRAVENOUS | Status: DC
  Administered 2019-07-22: 3 mL/kg/h via INTRAVENOUS

## 2019-07-21 MED ORDER — SODIUM CHLORIDE 0.9 % WEIGHT BASED INFUSION
1.0000 mL/kg/h | INTRAVENOUS | Status: DC
  Administered 2019-07-22: 1 mL/kg/h via INTRAVENOUS

## 2019-07-21 MED ORDER — SODIUM CHLORIDE 0.9% FLUSH: 3.0000 mL | INTRAVENOUS | Status: DC | PRN

## 2019-07-21 MED ORDER — PERFLUTREN LIPID MICROSPHERE
INTRAVENOUS | Status: AC
  Administered 2019-07-21: 3 mL
  Filled 2019-07-21: qty 10

## 2019-07-21 NOTE — Progress Notes (Signed)
Progress Note  Patient Name: Jonathan [name] Date of Encounter: 07/21/2019  Primary Cardiologist: Dr Claiborne Billings  Subjective   Some cough this AM with related chest pain. No central chest pressure or tightness  Inpatient Medications    Scheduled Meds: . aspirin EC  81 mg Oral q morning - 10a  . clopidogrel  75 mg Oral q morning - [id number]  . folic acid  1 mg Oral Daily  . LORazepam  0-4 mg Oral Q6H   Followed by  . [START ON 07/22/2019] LORazepam  0-4 mg Oral Q12H  . metoprolol succinate  25 mg Oral q morning - 10a  . mometasone-formoterol  2 puff Inhalation BID  . multivitamin with minerals  1 tablet Oral Daily  . nitroGLYCERIN  1 inch Topical Q6H  . ramipril  10 mg Oral q morning - 10a  . sodium chloride flush  3 mL Intravenous Q12H  . thiamine  100 mg Oral Daily   Or  . thiamine  100 mg Intravenous Daily   Continuous Infusions: . sodium chloride    . heparin 1,100 Units/hr (07/20/19 1652)   PRN Meds: sodium chloride, acetaminophen, albuterol, guaiFENesin-dextromethorphan, LORazepam **OR** LORazepam, nitroGLYCERIN, ondansetron (ZOFRAN) IV, sodium chloride flush   Vital Signs    Vitals:   07/20/19 2225 07/21/19 0500 07/21/19 0541 07/21/19 0807  BP: 123/77  (!) 158/81 (!) 110/59  Pulse: 66 (!) 52 (!) 54 72  Resp: 19 13    Temp: (!) 97.4 F (36.3 C)  97.7 F (36.5 C)   TempSrc: Oral  Oral   SpO2: 93% (!) 88% 91%   Weight:   94.4 kg   Height:        Intake/Output Summary (Last 24 hours) at 07/21/2019 0811 Last data filed at 07/21/2019 0600 Gross per 24 hour  Intake 637.97 ml  Output 575 ml  Net 62.97 ml   Last 3 Weights 07/21/2019 07/20/2019 06/08/2018  Weight (lbs) 208 lb 3.2 oz 210 lb 215 lb  Weight (kg) 94.439 kg 95.255 kg 97.523 kg      Telemetry    SR - Personally Reviewed  ECG    n/a - Personally Reviewed  Physical Exam   GEN: No acute distress.   Neck: No JVD Cardiac: RRR, no murmurs, rubs, or gallops.  Respiratory: Clear to auscultation  bilaterally. GI: Soft, nontender, non-distended  MS: No edema; No deformity. Neuro:  Nonfocal  Psych: Normal affect   Labs    High Sensitivity Troponin:   Recent Labs  Lab 07/20/19 1134 07/20/19 1441 07/21/19 0338  TROPONINIHS 84* 441* 329*      Chemistry Recent Labs  Lab 07/20/19 1134 07/21/19 0338  NA 135 133*  K 4.7 4.5  CL 97* 95*  CO2 19* 23  GLUCOSE 90 139*  BUN 14 23  CREATININE 1.40* 1.59*  CALCIUM 8.8* 9.0  PROT 7.6  --   ALBUMIN 4.1  --   AST 32  --   ALT 18  --   ALKPHOS 52  --   BILITOT 0.8  --   GFRNONAA 51* 43*  GFRAA 59* 50*  ANIONGAP 19* 15     Hematology Recent Labs  Lab 07/20/19 1134 07/21/19 0338  WBC 14.2* 11.0*  RBC 5.04 4.86  HGB 16.6 15.8  HCT 49.6 47.2  MCV 98.4 97.1  MCH 32.9 32.5  MCHC 33.5 33.5  RDW 13.2 13.1  PLT 265 254    BNPNo results for input(s): BNP, PROBNP in the last 168  hours.   DDimer  Recent Labs  Lab 07/20/19 1134  DDIMER 0.39     Radiology    Dg Chest 2 View  Result Date: 07/20/2019 CLINICAL DATA:  Pt reports central CP x 6 months EXAM: CHEST - 2 VIEW COMPARISON:  Chest radiograph 04/16/2018 FINDINGS: There is some narrowing in the mid trachea which may be positional. Normal heart size. The lungs are clear. No pneumothorax or pleural effusion. No acute finding in the visualized skeleton. IMPRESSION: No active cardiopulmonary disease. There is some narrowing in the mid trachea which may be positional. Electronically Signed   By: Audie Pinto M.D.   On: 07/20/2019 12:47    Cardiac Studies     Patient Profile     Jonathan [name] is a 68 y.o. male with a hx of known CAD s/p PCI in 2005 with HTN, HL, and ongoing tobacco use who is being seen today for the evaluation of chest pain at the request of Dr Regenia Skeeter.  Assessment & Plan    1. Chest pain/NSTEMI - history of CAD with prior PCI in 2005 - peak trop up to 441, trending down. EKG without specific ischemic changes - echo pending  -  somewhat atypical symptoms, however given his prior history and trop trend in setting of chest pain would plan for cath Monday to defintively evaluate - medical therapy with ASA 81, plavix 75 (on chronically), hep gtt, toprol 25, ramipril 10,  Intolerance to statins with prior LFT and CK elevation  2. EtoH abuse On CIWA protocol  3. AKI - uptrend in Cr, significant EtOH use suspect may be dehydrated. IVFs today prior to cath tomorrow. Cr was 1.3 in 2018  4. DNR/DNI status  5. Hyperlipidemia - LDL 196, intolerant to statins with prior LFT elevation and CK elevation - will need to start repatha as outpatient.    Cath orders placed and patient to be put on cath board  For questions or updates, please contact [place] Please consult [email or web address] for contact info under        Signed, Carlyle Dolly, MD  07/21/2019, 8:11 AM

## 2019-07-21 NOTE — Progress Notes (Signed)
ANTICOAGULATION CONSULT NOTE - Follow Up Consult  Pharmacy Consult for Heparin Indication: chest pain/ACS   Patient Measurements: Height: 5\' 8"  (172.7 cm) Weight: 208 lb 3.2 oz (94.4 kg) IBW/kg (Calculated) : 68.4 Heparin Dosing Weight: 88.4 kg  Vital Signs: Temp: 97.7 F (36.5 C) (11/08 2200) Temp Source: Oral (11/08 2200) BP: 128/57 (11/08 2200) Pulse Rate: 49 (11/08 2200)  Labs: Recent Labs    07/20/19 1134 07/20/19 1441 07/21/19 0338 07/21/19 1104 07/21/19 2156  HGB 16.6  --  15.8  --   --   HCT 49.6  --  47.2  --   --   PLT 265  --  254  --   --   HEPARINUNFRC  --   --  0.44 <0.10* 0.46  CREATININE 1.40*  --  1.59*  --   --   TROPONINIHS 84* 441* 329*  --   --      Medical History: Past Medical History:  Diagnosis Date  . Arthritis    hands   . Chronic bronchitis ([place])   . Hyperlipidemia    has not tolerated statins,  would not pursue PSK9 inhibitor as advised by Dr Claiborne Billings  . Hypertension   . Myocardial infarction ([place])   . Tobacco abuse      Assessment: 31 male admitted on 07/20/2019 with chest pain and SOB. Pharmacy consulted to dose heparin for possible NSTEMI. Troponin 441 now downtrending. Cardiology is planning for likely cardiac cath on Monday.   11/8 PM update:  Heparin level therapeutic x 1 after re-start s/p loss of IV access  Goal of Therapy:  Heparin level 0.3-0.7 units/ml Monitor platelets by anticoagulation protocol: Yes   Plan:  -Cont heparin at 1100 units/hr -Confirmatory heparin level with AM labs  Narda Bonds, PharmD, [place] Pharmacist Phone: [phone]

## 2019-07-21 NOTE — Progress Notes (Addendum)
ANTICOAGULATION CONSULT NOTE - Follow Up Consult  Pharmacy Consult for heparin Indication: chest pain/ACS   Patient Measurements: Height: 5\' 8"  (172.7 cm) Weight: 208 lb 3.2 oz (94.4 kg) IBW/kg (Calculated) : 68.4 Heparin Dosing Weight: 88.4 kg  Vital Signs: Temp: 97.7 F (36.5 C) (11/08 0541) Temp Source: Oral (11/08 0541) BP: 122/74 (11/08 1048) Pulse Rate: 72 (11/08 0807)  Labs: Recent Labs    07/20/19 1134 07/20/19 1441 07/21/19 0338  HGB 16.6  --  15.8  HCT 49.6  --  47.2  PLT 265  --  254  HEPARINUNFRC  --   --  0.44  CREATININE 1.40*  --  1.59*  TROPONINIHS 84* 441* 329*     Medical History: Past Medical History:  Diagnosis Date  . Arthritis    hands   . Chronic bronchitis ([place])   . Hyperlipidemia    has not tolerated statins,  would not pursue PSK9 inhibitor as advised by Dr Claiborne Billings  . Hypertension   . Myocardial infarction ([place])   . Tobacco abuse      Assessment: 51 male admitted on 07/20/2019 with chest pain and SOB. Pharmacy consulted to dose heparin for possible NSTEMI. Troponin 441 now downtrending. Cardiology is planning for likely cardiac cath on Monday.   Heparin level is undetectable at 1200 today. Discussed with RN and found patient's IV access to be infiltrated and nursing placed a consult to the IV team. CBC stable. No reported bleeding.    Goal of Therapy:  Heparin level 0.3-0.7 units/ml Monitor platelets by anticoagulation protocol: Yes    Plan:  Nursing will call back when IV access is regained and pharmacy will place order to bolus with heparin 4000 units and resume heparin at 1100 units/hr  Will obtain 8 hour heparin level from when IV access is regained Monitor heparin level, CBC, and S/S of bleeding daily  Follow up plans for cardiac cath   Addendum: IV access regained at 1330. Nursing will administer heparin 4000 unitsx1 bolus and resume previous rate of heparin 1100 units/hr. Will obtain heparin level at 2130.   Cristela Felt,  PharmD PGY1 Pharmacy Resident [employer]: [phone]  07/21/2019,11:30 AM

## 2019-07-21 NOTE — Progress Notes (Signed)
ANTICOAGULATION CONSULT NOTE Pharmacy Consult for heparin Indication: chest pain/ACS   Patient Measurements: Height: 5\' 8"  (172.7 cm) Weight: 210 lb (95.3 kg) IBW/kg (Calculated) : 68.4 Heparin Dosing Weight: 88.4 kg  Vital Signs: Temp: 97.4 F (36.3 C) (11/07 2225) Temp Source: Oral (11/07 2225) BP: 123/77 (11/07 2225) Pulse Rate: 66 (11/07 2225)  Labs: Recent Labs    07/20/19 1134 07/20/19 1441 07/21/19 0338  HGB 16.6  --  15.8  HCT 49.6  --  47.2  PLT 265  --  254  HEPARINUNFRC  --   --  0.44  CREATININE 1.40*  --  1.59*  TROPONINIHS 84* 441*  --    Assessment: .68 y.o. male with chest pain for heparin  Goal of Therapy:  Heparin level 0.3-0.7 units/ml Monitor platelets by anticoagulation protocol: Yes   Plan:  Continue Heparin at current rate  Recheck level in 6 hours to verify  Caryl Pina 07/21/2019,5:24 AM

## 2019-07-21 NOTE — Progress Notes (Signed)
  Echocardiogram 2D Echocardiogram has been performed.  Jonathan [name] 07/21/2019, 2:50 PM

## 2019-07-21 NOTE — Plan of Care (Signed)
  Problem: Education: Goal: Knowledge of General Education information will improve Description: Including pain rating scale, medication(s)/side effects and non-pharmacologic comfort measures Outcome: Progressing   Problem: Clinical Measurements: Goal: Ability to maintain clinical measurements within normal limits will improve Outcome: Progressing Goal: Will remain free from infection Outcome: Progressing   

## 2019-07-22 ENCOUNTER — Encounter (HOSPITAL_COMMUNITY): Payer: Self-pay | Admitting: Cardiovascular Disease

## 2019-07-22 ENCOUNTER — Encounter (HOSPITAL_COMMUNITY)
Admission: EM | Disposition: A | Discharge status: Home / Self Care | Payer: Self-pay | Source: Home / Self Care | Attending: Internal Medicine

## 2019-07-22 DIAGNOSIS — E785 Hyperlipidemia, unspecified: Secondary | ICD-10-CM

## 2019-07-22 DIAGNOSIS — I25110 Atherosclerotic heart disease of native coronary artery with unstable angina pectoris: Secondary | ICD-10-CM

## 2019-07-22 DIAGNOSIS — J449 Chronic obstructive pulmonary disease, unspecified: Secondary | ICD-10-CM

## 2019-07-22 DIAGNOSIS — N183 Chronic kidney disease, stage 3 unspecified: Secondary | ICD-10-CM

## 2019-07-22 HISTORY — PX: LEFT HEART CATH AND CORONARY ANGIOGRAPHY: CATH118249

## 2019-07-22 LAB — BASIC METABOLIC PANEL
Anion gap: 11 (ref 5–15)
Anion gap: 14 (ref 5–15)
BUN: 29 mg/dL — ABNORMAL HIGH (ref 8–23)
BUN: 27 mg/dL — ABNORMAL HIGH (ref 8–23)
CO2: 25 mmol/L (ref 22–32)
CO2: 24 mmol/L (ref 22–32)
Calcium: 8.6 mg/dL — ABNORMAL LOW (ref 8.9–10.3)
Calcium: 8.9 mg/dL (ref 8.9–10.3)
Chloride: 97 mmol/L — ABNORMAL LOW (ref 98–111)
Chloride: 97 mmol/L — ABNORMAL LOW (ref 98–111)
Creatinine, Ser: 1.34 mg/dL — ABNORMAL HIGH (ref 0.61–1.24)
Creatinine, Ser: 1.47 mg/dL — ABNORMAL HIGH (ref 0.61–1.24)
GFR calc Af Amer: >60 mL/min (ref >60)
GFR calc Af Amer: 55 mL/min — ABNORMAL LOW (ref >60)
GFR calc non Af Amer: 53 mL/min — ABNORMAL LOW (ref >60)
GFR calc non Af Amer: 48 mL/min — ABNORMAL LOW (ref >60)
Glucose, Bld: 96 mg/dL (ref 70–99)
Glucose, Bld: 96 mg/dL (ref 70–99)
Potassium: 5.7 mmol/L — ABNORMAL HIGH (ref 3.5–5.1)
Potassium: 3.7 mmol/L (ref 3.5–5.1)
Sodium: 133 mmol/L — ABNORMAL LOW (ref 135–145)
Sodium: 135 mmol/L (ref 135–145)

## 2019-07-22 LAB — CBC
HCT: 43.4 % (ref 39.0–52.0)
Hemoglobin: 14.4 g/dL (ref 13.0–17.0)
MCH: 32.8 pg (ref 26.0–34.0)
MCHC: 33.2 g/dL (ref 30.0–36.0)
MCV: 98.9 fL (ref 80.0–100.0)
Platelets: 212 10*3/uL (ref 150–400)
RBC: 4.39 MIL/uL (ref 4.22–5.81)
RDW: 13.4 % (ref 11.5–15.5)
WBC: 13.7 10*3/uL — ABNORMAL HIGH (ref 4.0–10.5)
nRBC: 0 % (ref 0.0–0.2)

## 2019-07-22 LAB — HEPARIN LEVEL (UNFRACTIONATED): Heparin Unfractionated: 0.47 IU/mL (ref 0.30–0.70)

## 2019-07-22 SURGERY — LEFT HEART CATH AND CORONARY ANGIOGRAPHY
Anesthesia: LOCAL

## 2019-07-22 MED ORDER — ACETAMINOPHEN 325 MG PO TABS: 650.0000 mg | ORAL_TABLET | ORAL | Status: DC | PRN

## 2019-07-22 MED ORDER — HYDRALAZINE HCL 20 MG/ML IJ SOLN: 10.0000 mg | INTRAMUSCULAR | Status: DC | PRN

## 2019-07-22 MED ORDER — DIAZEPAM 5 MG PO TABS
5.0000 mg | ORAL_TABLET | Freq: Four times a day (QID) | ORAL | Status: DC | PRN
  Filled 2019-07-22: qty 1

## 2019-07-22 MED ORDER — LABETALOL HCL 5 MG/ML IV SOLN: 10.0000 mg | INTRAVENOUS | Status: DC | PRN

## 2019-07-22 MED ORDER — LIDOCAINE HCL (PF) 1 % IJ SOLN
INTRAMUSCULAR | Status: DC | PRN
  Administered 2019-07-22: 2 mL

## 2019-07-22 MED ORDER — FENTANYL CITRATE (PF) 100 MCG/2ML IJ SOLN
INTRAMUSCULAR | Status: DC | PRN
  Administered 2019-07-22: 50 ug via INTRAVENOUS

## 2019-07-22 MED ORDER — ONDANSETRON HCL 4 MG/2ML IJ SOLN: 4.0000 mg | Freq: Four times a day (QID) | INTRAMUSCULAR | Status: DC | PRN

## 2019-07-22 MED ORDER — HEPARIN (PORCINE) IN NACL 1000-0.9 UT/500ML-% IV SOLN
INTRAVENOUS | Status: AC
  Filled 2019-07-22: qty 1000

## 2019-07-22 MED ORDER — VERAPAMIL HCL 2.5 MG/ML IV SOLN
INTRAVENOUS | Status: AC
  Filled 2019-07-22: qty 2

## 2019-07-22 MED ORDER — HEPARIN SODIUM (PORCINE) 1000 UNIT/ML IJ SOLN
INTRAMUSCULAR | Status: DC | PRN
  Administered 2019-07-22: 4700 [IU] via INTRAVENOUS

## 2019-07-22 MED ORDER — SODIUM CHLORIDE 0.9 % IV SOLN: 250.0000 mL | INTRAVENOUS | Status: DC | PRN

## 2019-07-22 MED ORDER — HEPARIN SODIUM (PORCINE) 1000 UNIT/ML IJ SOLN
INTRAMUSCULAR | Status: AC
  Filled 2019-07-22: qty 1

## 2019-07-22 MED ORDER — SODIUM CHLORIDE 0.9 % IV SOLN
INTRAVENOUS | Status: DC
  Administered 2019-07-23: 10:00:00 via INTRAVENOUS

## 2019-07-22 MED ORDER — SODIUM CHLORIDE 0.9 % IV SOLN
INTRAVENOUS | Status: DC
  Administered 2019-07-22: 17:00:00 via INTRAVENOUS

## 2019-07-22 MED ORDER — SODIUM CHLORIDE 0.9% FLUSH
3.0000 mL | Freq: Two times a day (BID) | INTRAVENOUS | Status: DC
  Administered 2019-07-24: 12:00:00 3 mL via INTRAVENOUS

## 2019-07-22 MED ORDER — VERAPAMIL HCL 2.5 MG/ML IV SOLN
INTRAVENOUS | Status: DC | PRN
  Administered 2019-07-22: 10 mL via INTRA_ARTERIAL

## 2019-07-22 MED ORDER — ISOSORBIDE MONONITRATE ER 30 MG PO TB24
30.0000 mg | ORAL_TABLET | Freq: Every day | ORAL | Status: DC
  Administered 2019-07-22 – 2019-07-25 (×4): 30 mg via ORAL
  Filled 2019-07-22 (×4): qty 1

## 2019-07-22 MED ORDER — FENTANYL CITRATE (PF) 100 MCG/2ML IJ SOLN
INTRAMUSCULAR | Status: AC
  Filled 2019-07-22: qty 2

## 2019-07-22 MED ORDER — ASPIRIN 81 MG PO CHEW: 81.0000 mg | CHEWABLE_TABLET | Freq: Every day | ORAL | Status: DC

## 2019-07-22 MED ORDER — MIDAZOLAM HCL 2 MG/2ML IJ SOLN
INTRAMUSCULAR | Status: DC | PRN
  Administered 2019-07-22: 2 mg via INTRAVENOUS

## 2019-07-22 MED ORDER — LEVALBUTEROL HCL 0.63 MG/3ML IN NEBU
0.6300 mg | INHALATION_SOLUTION | Freq: Four times a day (QID) | RESPIRATORY_TRACT | Status: DC | PRN
  Administered 2019-07-23: 0.63 mg via RESPIRATORY_TRACT
  Filled 2019-07-22: qty 3

## 2019-07-22 MED ORDER — HEPARIN (PORCINE) 25000 UT/250ML-% IV SOLN
1250.0000 [IU]/h | INTRAVENOUS | Status: DC
  Administered 2019-07-22: 1100 [IU]/h via INTRAVENOUS
  Administered 2019-07-23: 18:00:00 1250 [IU]/h via INTRAVENOUS
  Filled 2019-07-22 (×2): qty 250

## 2019-07-22 MED ORDER — HEPARIN (PORCINE) IN NACL 1000-0.9 UT/500ML-% IV SOLN
INTRAVENOUS | Status: DC | PRN
  Administered 2019-07-22 (×2): 500 mL

## 2019-07-22 MED ORDER — IOHEXOL 350 MG/ML SOLN
INTRAVENOUS | Status: DC | PRN
  Administered 2019-07-22: 70 mL

## 2019-07-22 MED ORDER — SODIUM CHLORIDE 0.9% FLUSH: 3.0000 mL | INTRAVENOUS | Status: DC | PRN

## 2019-07-22 MED ORDER — LIDOCAINE HCL (PF) 1 % IJ SOLN
INTRAMUSCULAR | Status: AC
  Filled 2019-07-22: qty 30

## 2019-07-22 MED ORDER — MIDAZOLAM HCL 2 MG/2ML IJ SOLN
INTRAMUSCULAR | Status: AC
  Filled 2019-07-22: qty 2

## 2019-07-22 SURGICAL SUPPLY — 11 items
CATH INFINITI 5FR ANG PIGTAIL (CATHETERS) ×2 IMPLANT
CATH OPTITORQUE TIG 4.0 5F (CATHETERS) ×2 IMPLANT
DEVICE RAD COMP TR BAND LRG (VASCULAR PRODUCTS) ×2 IMPLANT
GLIDESHEATH SLEND SS 6F .021 (SHEATH) ×2 IMPLANT
GUIDEWIRE INQWIRE 1.5J.035X260 (WIRE) ×1 IMPLANT
INQWIRE 1.5J .035X260CM (WIRE) ×2
KIT HEART LEFT (KITS) ×2 IMPLANT
PACK CARDIAC CATHETERIZATION (CUSTOM PROCEDURE TRAY) ×2 IMPLANT
SHEATH PROBE COVER 6X72 (BAG) ×2 IMPLANT
TRANSDUCER W/STOPCOCK (MISCELLANEOUS) ×2 IMPLANT
TUBING CIL FLEX 10 FLL-RA (TUBING) ×2 IMPLANT

## 2019-07-22 NOTE — Consult Note (Signed)
[address]       [place], [address]             [phone]      Cardiothoracic Surgery Consultation  Reason for Consult: Severe multi-vessel coronary artery disease Referring Physician: Dr. Daneen Schick  Jonathan [name] is an 68 y.o. male.  HPI:   The patient is a 68 year old smoker with COPD, HTN, hyperlipidemia and CAD s/p RCA PCI with stents in 2005 after MI. He presented to the ER on 11/7 with a 6 month history of episodic chest pain and SOB which he says occurs after coughing and exertion. He reports fatigue, orthopnea and lower extremity edema. He says that he frequently takes a shot of vodka for the pain and says he drinks about a half pint a day.  He lives with his wife. He says he does some yard work but no exercise. Still smokes 1 ppd.  Past Medical History:  Diagnosis Date  . Arthritis    hands   . Chronic bronchitis ([place])   . Hyperlipidemia    has not tolerated statins,  would not pursue PSK9 inhibitor as advised by Dr Claiborne Billings  . Hypertension   . Myocardial infarction ([place])   . Tobacco abuse     Past Surgical History:  Procedure Laterality Date  . CARDIAC CATHETERIZATION    . CARDIOVASCULAR STRESS TEST  11/11/2011  . CORONARY ANGIOPLASTY     stents- 01/2004   . heart stents  01/2004  . INGUINAL HERNIA REPAIR Right 12/06/2012   Procedure: HERNIA REPAIR INGUINAL ADULT;  Surgeon: [name], DO;  Location: [facility];  Service: General;  Laterality: Right;  . INSERTION OF MESH Right 12/06/2012   Procedure: INSERTION OF MESH;  Surgeon: [name], DO;  Location: [facility];  Service: General;  Laterality: Right;  . LEFT HEART CATH AND CORONARY ANGIOGRAPHY N/A 07/22/2019   Procedure: LEFT HEART CATH AND CORONARY ANGIOGRAPHY;  Surgeon: [name], MD;  Location: [facility];  Service: Cardiovascular;  Laterality: N/A;    History reviewed. No pertinent family history.  Social History:  reports that he has been smoking cigarettes. He has a  53.00 pack-year smoking history. He has never used smokeless tobacco. He reports current alcohol use of about 5.0 standard drinks of alcohol per week. He reports that he does not use drugs.  Allergies:  Allergies  Allergen Reactions  . Niaspan [Niacin Er] Itching and Other (See Comments)    insomnia  . Statins     LFT elevavation, CK elevevation    Medications:  I have reviewed the patient's current medications. Prior to Admission:  Medications Prior to Admission  Medication Sig Dispense Refill Last Dose  . albuterol (PROVENTIL HFA;VENTOLIN HFA) 108 (90 Base) MCG/ACT inhaler Inhale 2 puffs into the lungs every 4 (four) hours as needed for wheezing or shortness of breath (cough, shortness of breath or wheezing.). (Patient taking differently: Inhale 2 puffs into the lungs every 4 (four) hours as needed for wheezing or shortness of breath (cough). ) 1 Inhaler 5 few days ago  . aspirin EC 81 MG tablet Take 81 mg by mouth every morning.   07/20/2019 at am  . budesonide-formoterol (SYMBICORT) 160-4.5 MCG/ACT inhaler Inhale 2 puffs into the lungs 2 (two) times daily. (Patient taking differently: Inhale 2 puffs into the lungs 2 (two) times daily as needed (shortness of breath/wheezing). ) 1 Inhaler 11 couple days ago  . clopidogrel (PLAVIX)  75 MG tablet TAKE 1 TABLET BY MOUTH  DAILY (Patient taking differently: Take 75 mg by mouth every morning. ) 90 tablet 3 07/20/2019 at am  . metoprolol succinate (TOPROL-XL) 25 MG 24 hr tablet TAKE 1 TABLET BY MOUTH  DAILY (Patient taking differently: Take 25 mg by mouth every morning. ) 90 tablet 3 07/20/2019 at 800  . nitroGLYCERIN (NITROSTAT) 0.4 MG SL tablet Place 1 tablet (0.4 mg total) under the tongue every 5 (five) minutes as needed for chest pain. 25 tablet 3 several months ago  . ramipril (ALTACE) 10 MG capsule TAKE 1 CAPSULE BY MOUTH  DAILY (Patient taking differently: Take 10 mg by mouth every morning. ) 90 capsule 3 07/20/2019 at am  .  budesonide-formoterol (SYMBICORT) 160-4.5 MCG/ACT inhaler Inhale 2 puffs into the lungs 2 (two) times daily. (Patient not taking: Reported on 07/20/2019) 1 Inhaler 0 Not Taking at Unknown time  . Omega-3 Fatty Acids (FISH OIL) 1000 MG CAPS Take 2 capsules (2,000 mg total) by mouth daily. (Patient not taking: Reported on 07/20/2019) 90 capsule 0 Not Taking at Unknown time  . valsartan (DIOVAN) 160 MG tablet Take 1 tablet (160 mg total) by mouth daily. (Patient not taking: Reported on 07/20/2019) 30 tablet 11 Not Taking at Unknown time   Scheduled: . aspirin  81 mg Oral Daily  . aspirin EC  81 mg Oral q morning - [id number]  . folic acid  1 mg Oral Daily  . isosorbide mononitrate  30 mg Oral Daily  . LORazepam  0-4 mg Oral Q12H  . metoprolol succinate  25 mg Oral q morning - 10a  . mometasone-formoterol  2 puff Inhalation BID  . multivitamin with minerals  1 tablet Oral Daily  . ramipril  10 mg Oral q morning - 10a  . sodium chloride flush  3 mL Intravenous Q12H  . thiamine  100 mg Oral Daily   Or  . thiamine  100 mg Intravenous Daily   Continuous: . sodium chloride Stopped (07/22/19 1845)  . sodium chloride    . heparin 1,100 Units/hr (07/22/19 1845)   [id number] chloride, acetaminophen, diazepam, guaiFENesin-dextromethorphan, levalbuterol, LORazepam **OR** LORazepam, morphine injection, nitroGLYCERIN, ondansetron (ZOFRAN) IV, oxyCODONE-acetaminophen, sodium chloride flush  Results for orders placed or performed during the hospital encounter of 07/20/19 (from the past 48 hour(s))  SARS CORONAVIRUS 2 (TAT 6-24 HRS) Nasopharyngeal Nasopharyngeal Swab     Status: None   Collection Time: 07/20/19  4:50 PM   Specimen: Nasopharyngeal Swab  Result Value Ref Range   SARS Coronavirus 2 NEGATIVE NEGATIVE    Comment: (NOTE) SARS-CoV-2 target nucleic acids are NOT DETECTED. The SARS-CoV-2 RNA is generally detectable in upper and lower respiratory specimens during the acute phase of infection. Negative  results do not preclude SARS-CoV-2 infection, do not rule out co-infections with other pathogens, and should not be used as the sole basis for treatment or other patient management decisions. Negative results must be combined with clinical observations, patient history, and epidemiological information. The expected result is Negative. Fact Sheet for Patients: SugarRoll.be Fact Sheet for Healthcare Providers: https://www.woods-mathews.com/ This test is not yet approved or cleared by the [place] FDA and  has been authorized for detection and/or diagnosis of SARS-CoV-2 by FDA under an Emergency Use Authorization (EUA). This EUA will remain  in effect (meaning this test can be used) for the duration of the COVID-19 declaration under Section 56 4(b)(1) of the Act, 21 U.S.C. section 360bbb-3(b)(1), unless the authorization is terminated or revoked  sooner. Performed at [facility], [place] [address] [place], [place] [address]   HIV Antibody (routine testing w rflx)     Status: None   Collection Time: 07/20/19  6:17 PM  Result Value Ref Range   HIV Screen 4th Generation wRfx NON REACTIVE NON REACTIVE    Comment: Performed at [facility] [address] [place], [place] [address]  Magnesium     Status: None   Collection Time: 07/20/19  6:17 PM  Result Value Ref Range   Magnesium 2.1 1.7 - 2.4 mg/dL    Comment: Performed at [facility], [place] [address] [place], [place] [address]  Phosphorus     Status: None   Collection Time: 07/20/19  6:17 PM  Result Value Ref Range   Phosphorus 3.1 2.5 - 4.6 mg/dL    Comment: Performed at [facility], [place] [address] [place], [place] [address]  CBC     Status: Abnormal   Collection Time: 07/21/19  3:38 AM  Result Value Ref Range   WBC 11.0 (H) 4.0 - 10.5 K/uL   RBC 4.86 4.22 - 5.81 MIL/uL   Hemoglobin 15.8 13.0 - 17.0 g/dL   HCT 47.2 39.0 - 52.0 %   MCV 97.1 80.0 - 100.0 fL    MCH 32.5 26.0 - 34.0 pg   MCHC 33.5 30.0 - 36.0 g/dL   RDW 13.1 11.5 - 15.5 %   Platelets 254 150 - 400 K/uL   nRBC 0.0 0.0 - 0.2 %    Comment: Performed at [facility], [place] [address] [place], [place] [address]  Heparin level (unfractionated)     Status: None   Collection Time: 07/21/19  3:38 AM  Result Value Ref Range   Heparin Unfractionated 0.44 0.30 - 0.70 IU/mL    Comment: (NOTE) If heparin results are below expected values, and patient dosage has  been confirmed, suggest follow up testing of antithrombin III levels. Performed at [facility], [place] [address] [place], [place] [address]   Lipid panel     Status: Abnormal   Collection Time: 07/21/19  3:38 AM  Result Value Ref Range   Cholesterol 288 (H) 0 - 200 mg/dL   Triglycerides 79 <150 mg/dL   HDL 76 >40 mg/dL   Total CHOL/HDL Ratio 3.8 RATIO   VLDL 16 0 - 40 mg/dL   LDL Cholesterol 196 (H) 0 - 99 mg/dL    Comment:        Total Cholesterol/HDL:CHD Risk Coronary Heart Disease Risk Table                     Men   Women  1/2 Average Risk   3.4   3.3  Average Risk       5.0   4.4  2 X Average Risk   9.6   7.1  3 X Average Risk  23.4   11.0        Use the calculated Patient Ratio above and the CHD Risk Table to determine the patient's CHD Risk.        ATP III CLASSIFICATION (LDL):  <100     mg/dL   Optimal  100-129  mg/dL   Near or Above                    Optimal  130-159  mg/dL   Borderline  160-189  mg/dL   High  >190  mg/dL   Very High Performed at [facility], [place] [address] [place], [place] [id number]   Basic metabolic panel     Status: Abnormal   Collection Time: 07/21/19  3:38 AM  Result Value Ref Range   Sodium 133 (L) 135 - 145 mmol/L   Potassium 4.5 3.5 - 5.1 mmol/L   Chloride 95 (L) 98 - 111 mmol/L   CO2 23 22 - 32 mmol/L   Glucose, Bld 139 (H) 70 - 99 mg/dL   BUN 23 8 - 23 mg/dL   Creatinine, Ser 1.59 (H) 0.61 - 1.24 mg/dL   Calcium 9.0 8.9 - 10.3 mg/dL   GFR calc  non Af Amer 43 (L) >60 mL/min   GFR calc Af Amer 50 (L) >60 mL/min   Anion gap 15 5 - 15    Comment: Performed at [facility] [address] [place], [place] [address]  Troponin I (High Sensitivity)     Status: Abnormal   Collection Time: 07/21/19  3:38 AM  Result Value Ref Range   Troponin I (High Sensitivity) 329 (HH) <18 ng/L    Comment: CRITICAL VALUE NOTED.  VALUE IS CONSISTENT WITH PREVIOUSLY REPORTED AND CALLED VALUE. Performed at [facility], [place] [address], [place], [place] [address]   Heparin level     Status: Abnormal   Collection Time: 07/21/19 11:04 AM  Result Value Ref Range   Heparin Unfractionated <0.10 (L) 0.30 - 0.70 IU/mL    Comment: (NOTE) If heparin results are below expected values, and patient dosage has  been confirmed, suggest follow up testing of antithrombin III levels. Performed at [facility], [place] [address] [place], [place] [address]   Heparin level (unfractionated)     Status: None   Collection Time: 07/21/19  9:56 PM  Result Value Ref Range   Heparin Unfractionated 0.46 0.30 - 0.70 IU/mL    Comment: (NOTE) If heparin results are below expected values, and patient dosage has  been confirmed, suggest follow up testing of antithrombin III levels. Performed at [facility], [place] [address] [place], [place] [address]   CBC     Status: Abnormal   Collection Time: 07/22/19  4:03 AM  Result Value Ref Range   WBC 13.7 (H) 4.0 - 10.5 K/uL   RBC 4.39 4.22 - 5.81 MIL/uL   Hemoglobin 14.4 13.0 - 17.0 g/dL   HCT 43.4 39.0 - 52.0 %   MCV 98.9 80.0 - 100.0 fL   MCH 32.8 26.0 - 34.0 pg   MCHC 33.2 30.0 - 36.0 g/dL   RDW 13.4 11.5 - 15.5 %   Platelets 212 150 - 400 K/uL   nRBC 0.0 0.0 - 0.2 %    Comment: Performed at [facility], [place] [address], [place], [place] [address]  Heparin level (unfractionated)     Status: None   Collection Time: 07/22/19  4:03 AM  Result Value Ref Range   Heparin Unfractionated 0.47 0.30 -  0.70 IU/mL    Comment: (NOTE) If heparin results are below expected values, and patient dosage has  been confirmed, suggest follow up testing of antithrombin III levels. Performed at [facility], [place] [address], [place], [place] [id number]   Basic metabolic panel- STAT     Status: Abnormal   Collection Time: 07/22/19  5:25 AM  Result Value Ref Range   Sodium 133 (L) 135 - 145 mmol/L   Potassium 5.7 (H)  3.5 - 5.1 mmol/L    Comment: SPECIMEN HEMOLYZED. HEMOLYSIS MAY AFFECT INTEGRITY OF RESULTS.   Chloride 97 (L) 98 - 111 mmol/L   CO2 25 22 - 32 mmol/L   Glucose, Bld 96 70 - 99 mg/dL   BUN 29 (H) 8 - 23 mg/dL   Creatinine, Ser 1.34 (H) 0.61 - 1.24 mg/dL   Calcium 8.6 (L) 8.9 - 10.3 mg/dL   GFR calc non Af Amer 53 (L) >60 mL/min   GFR calc Af Amer >60 >60 mL/min   Anion gap 11 5 - 15    Comment: Performed at [facility] [address] [place], [place] [id number]  Basic metabolic panel- STAT     Status: Abnormal   Collection Time: 07/22/19  7:00 AM  Result Value Ref Range   Sodium 135 135 - 145 mmol/L   Potassium 3.7 3.5 - 5.1 mmol/L   Chloride 97 (L) 98 - 111 mmol/L   CO2 24 22 - 32 mmol/L   Glucose, Bld 96 70 - 99 mg/dL   BUN 27 (H) 8 - 23 mg/dL   Creatinine, Ser 1.47 (H) 0.61 - 1.24 mg/dL   Calcium 8.9 8.9 - 10.3 mg/dL   GFR calc non Af Amer 48 (L) >60 mL/min   GFR calc Af Amer 55 (L) >60 mL/min   Anion gap 14 5 - 15    Comment: Performed at [facility] [address] [place], [place] [address]    No results found.  Review of Systems  Constitutional: Positive for malaise/fatigue.  HENT: Negative.   Eyes: Negative.   Respiratory: Positive for cough and shortness of breath. Negative for sputum production.   Cardiovascular: Positive for chest pain, orthopnea and leg swelling.  Gastrointestinal: Negative.   Genitourinary: Negative.   Musculoskeletal: Negative.   Skin: Negative.   Neurological: Negative for dizziness and focal weakness.   Endo/Heme/Allergies: Negative.   Psychiatric/Behavioral: Negative.    Blood pressure (!) 127/56, pulse (!) 53, temperature 98.6 F (37 C), temperature source Oral, resp. rate 20, height 5\' 8"  (1.727 m), weight 94.7 kg, SpO2 90 %. Physical Exam  Constitutional: He is oriented to person, place, and time. He appears well-developed and well-nourished. No distress.  HENT:  Head: Normocephalic and atraumatic.  Eyes: Pupils are equal, round, and reactive to light. EOM are normal.  Neck: Normal range of motion. Neck supple. No thyromegaly present.  Cardiovascular: Normal rate, regular rhythm, normal heart sounds and intact distal pulses.  No murmur heard. Respiratory: Breath sounds normal. No respiratory distress.  GI: Soft. Bowel sounds are normal. He exhibits no distension. There is no abdominal tenderness.  Musculoskeletal: Normal range of motion.        General: Edema present.  Lymphadenopathy:    He has no cervical adenopathy.  Neurological: He is alert and oriented to person, place, and time.  Skin: Skin is warm and dry.   Patient Name:   Jonathan [name] Date of Exam: 07/21/2019 Medical Rec #:  [id number]      Height:       68.0 in Accession #:    [id number]     Weight:       208.2 lb Date of Birth:  Mar 07, 1951      BSA:          2.08 m Patient Age:    83 years       BP:           110/59 mmHg Patient  Gender: M              HR:           72 bpm. Exam Location:  Inpatient  Procedure: 2D Echo  Indications:    elevated troponin   History:        Patient has no prior history of Echocardiogram examinations.                 Previous Myocardial Infarction Risk Factors:Current Smoker,                 Dyslipidemia and Hypertension.   Sonographer:    Jannett Celestine RDCS (AE) Referring Phys: 60 DAYNA N DUNN    Sonographer Comments: Technically difficult study due to poor echo windows, suboptimal apical window and suboptimal parasternal window. Restricted mobility. see message center  comments IMPRESSIONS    1. Extremely limited images despite use of Definity.  2. Definity contrast agent was given IV to delineate the left ventricular endocardial borders.  3. Left ventricular ejection fraction, by visual estimation, is approximately 55 to 60%. The left ventricle has normal function. There is no left ventricular hypertrophy. Cannot comment of focal wall motion.  4. Global right ventricle was not well visualized.The right ventricular size is not well visualized. Right vetricular wall thickness was not assessed.  5. Left atrial size was not well visualized.  6. Right atrial size was not well visualized.  7. Presence of pericardial fat pad.  8. The mitral valve was not well visualized. No evidence of mitral valve regurgitation.  9. The tricuspid valve is not well visualized. Tricuspid valve regurgitation not well evaluated. 10. The aortic valve was not well visualized. Aortic valve regurgitation is not visualized. 11. The pulmonic valve was not well visualized. Pulmonic valve regurgitation is not visualized. 12. The aortic root was not well visualized. 13. The inferior vena cava appears dilated, but difficult to estimate CVP. 14. The interatrial septum was not well visualized.  FINDINGS  Left Ventricle: Left ventricular ejection fraction, by visual estimation, is 55 to 60%. The left ventricle has normal function. Definity contrast agent was given IV to delineate the left ventricular endocardial borders. There is no left ventricular  hypertrophy. Left ventricular diastolic parameters were normal.  Right Ventricle: The right ventricular size is not well visualized. Right vetricular wall thickness was not assessed. Global RV systolic function is was not well visualized.  Left Atrium: Left atrial size was not well visualized.  Right Atrium: Right atrial size was not well visualized  Pericardium: There is no evidence of pericardial effusion. Presence of pericardial fat  pad.  Mitral Valve: The mitral valve was not well visualized. No evidence of mitral valve regurgitation.  Tricuspid Valve: The tricuspid valve is not well visualized. Tricuspid valve regurgitation not well evaluated.  Aortic Valve: The aortic valve was not well visualized. Aortic valve regurgitation is not visualized.  Pulmonic Valve: The pulmonic valve was not well visualized. Pulmonic valve regurgitation is not visualized.  Aorta: The aortic root was not well visualized.  Venous: The inferior vena cava appears dilated, but difficult to estimate CVP.  IAS/Shunts: The interatrial septum was not well visualized.       Diastology LV e' lateral:   9.79 cm/s LV E/e' lateral: 7.1 LV e' medial:    11.20 cm/s LV E/e' medial:  6.2    AORTIC VALVE LVOT Vmax:   71.80 cm/s LVOT Vmean:  47.600 cm/s LVOT VTI:    0.123 m  MITRAL VALVE  MV Area (PHT): 2.22 cm             SHUNTS MV PHT:        99.18 msec           Systemic VTI: 0.12 m MV Decel Time: 342 msec MV E velocity: 69.40 cm/s 103 cm/s MV A velocity: 74.10 cm/s 70.3 cm/s MV E/A ratio:  0.94       1.5    Rozann Lesches MD Electronically signed by Rozann Lesches MD Signature Date/Time: 07/21/2019/3:02:49 PM     Physicians  Panel Physicians Referring Physician Case Authorizing Physician  [name], MD (Primary)    Procedures  LEFT HEART CATH AND CORONARY ANGIOGRAPHY  Conclusion    Prox RCA to Mid RCA lesion is 80% stenosed.  Prox RCA lesion is 40% stenosed with 30% stenosed side branch in RV Branch.  Mid RCA to Dist RCA lesion is 70% stenosed.  Ost Cx to Prox Cx lesion is 95% stenosed.  1st Diag lesion is 90% stenosed.  Mid LAD lesion is 70% stenosed.   Significant multivessel CAD with 90% diffuse proximal stenosis in the first diagonal branch of the LAD with 70% LAD stenosis beyond the diagonal takeoff; 95% ostial left circumflex stenosis with the vessel immediately bifurcating into the  AV groove and circumflex; and mild to moderate in-stent narrowing of approximately 40% in the previously placed proximal RCA stent with focal 80% mid distal in-stent restenosis and new 70% stenosis beyond the stented segment proximal to the acute margin and a large dominant RCA.  Low normal global LV contractility with EF estimate at 50 to 55% with mild residual inferior hypocontractility sustained in 2005.  LVEDP 15 mm  RECOMMENDATION: I had a long discussion with the patient prior to the catheterization and he agreed to rescind his DNR for the procedure in the event countershock or CPR was necessary.  After review of the angiographic findings in detail with the patient as well as the description of good target vessels the patient now seems amenable to consider possible CABG revascularization surgery.  Will obtain surgical consultation.  Plavix will be held; consider P212 testing in several days if the plan is to move forward with CABG revascularization surgery.  Will initiate heparin therapy 8 hours post TR band.   Recommendations  Antiplatelet/Anticoag Recommend Aspirin 81mg  daily for moderate CAD.  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  The patient has intolerance to statin therapy.  He has a history of hypertension, tobacco abuse, and Gold II COPD.  He had recently developed unstable anginal symptomatology leading to his rotation.  He is now referred for cardiac catheterization.  In the chart, there was DNR status. I discussed this with the patient prior to the procedure.  He agreed to rescind this in the event he developed an arrhythmia  and required defibrillation or CPR during the procedure.  The patient was brought to the cardiac catheterization lab in the fasting state. The patient was premedicated with Versed 2 mg and fentanyl 50 mcg. A right radial approach was utilized after an Allen's test verified adequate circulation. The right radial artery was punctured via the Seldinger technique, and  a 6 [place] Glidesheath Slender was inserted without difficulty.  A radial cocktail consisting of Verapamil 3 mg was administered. The patient received weight adjusted heparin. A safety J wire was advanced into the ascending aorta. Diagnostic catheterization was done with a 5 [place] TIG 4.0 catheter. A 5 French pigtail catheter was used for hand injection left ventriculography. A TR radial band was applied for hemostasis.  At the completion of the procedure, I reviewed the catheterization findings with the patient in detail.  After seeing his anatomy and after discussion he is amenable to have surgical consultation regarding potential CABG revascularization.  The patient left the catheterization laboratory in stable condition.   Estimated blood loss <50 mL.   During this procedure medications were administered to achieve and maintain moderate conscious sedation while the patient's heart rate, blood pressure, and oxygen saturation were continuously monitored and I was present face-to-face 100% of this time.  Medications (Filter: Administrations occurring from 07/22/19 0909 to 07/22/19 1027) (important)  Continuous medications are totaled by the amount administered until 07/22/19 1027.  Medication Rate/Dose/Volume Action  Date Time   fentaNYL (SUBLIMAZE) injection (mcg) 50 mcg Given 07/22/19 0940   Total dose as of 07/22/19 1027        50 mcg        midazolam (VERSED) injection (mg) 2 mg Given 07/22/19 0940   Total dose as of 07/22/19 1027        2 mg        lidocaine (PF) (XYLOCAINE) 1 % injection (mL) 2 mL Given 07/22/19 0943   Total dose  as of 07/22/19 1027        2 mL        Heparin (Porcine) in NaCl 1000-0.9 UT/500ML-% SOLN (mL) 500 mL Given 07/22/19 0944   Total dose as of 07/22/19 1027 500 mL Given 0944   1,000 mL        Radial Cocktail/Verapamil only (mL) 10 mL Given 07/22/19 0956   Total dose as of 07/22/19 1027        10 mL        heparin injection (Units) 4,700 Units Given 07/22/19 0958   Total dose as of 07/22/19 1027        4,700 Units        iohexol (OMNIPAQUE) 350 MG/ML injection (mL) 70 mL Given 07/22/19 1017   Total dose as of 07/22/19 1027        70 mL        folic acid (FOLVITE) tablet 1 mg (mg) *Not included in total Automatically Held 07/22/19 1000   Dosing weight:  95.3 kg        Total dose as of 07/22/19 1027        Cannot be calculated        metoprolol succinate (TOPROL-XL) 24 hr tablet 25 mg (mg) *Not included in total Automatically Held 07/22/19 1000   Total dose as of 07/22/19 1027        Cannot be calculated        multivitamin with minerals tablet 1 tablet (tablet) *Not included in total Automatically Held 07/22/19 1000   Dosing weight:  95.3 kg        Total dose as of 07/22/19 1027        Cannot be calculated        ramipril (ALTACE) capsule 10 mg (mg) *Not included in total Automatically Held 07/22/19 1000   Total dose as of 07/22/19 1027  Cannot be calculated        Sedation Time  Sedation Time Physician-1: 30 minutes 14 seconds  Contrast  Medication Name Total Dose  iohexol (OMNIPAQUE) 350 MG/ML injection 70 mL    Radiation/Fluoro  Fluoro time: 4.4 (min) DAP: 15.2 (Gycm2) Cumulative Air Kerma: 304 (mGy)  Coronary Findings  Diagnostic Dominance: Right Left Anterior Descending  Mid LAD lesion 70% stenosed  Mid LAD lesion is 70% stenosed.  First Diagonal Branch  1st Diag lesion 90% stenosed  1st Diag lesion is 90% stenosed.  Left Circumflex  Ost Cx to Prox Cx lesion 95% stenosed  Ost Cx to Prox Cx lesion is 95% stenosed.  Right Coronary Artery  Prox RCA lesion  40% stenosed with side branch in RV Branch 30% stenosed  Prox RCA lesion is 40% stenosed with 30% stenosed side branch in RV Branch.  Prox RCA to Mid RCA lesion 80% stenosed  Prox RCA to Mid RCA lesion is 80% stenosed. The lesion was previously treated.  Mid RCA to Dist RCA lesion 70% stenosed  Mid RCA to Dist RCA lesion is 70% stenosed.  Intervention  No interventions have been documented. Wall Motion  Resting       The mid inferior and basilar inferior segments are hypokinetic.           Left Heart  Left Ventricle The left ventricular size is normal. There are LV function abnormalities. There is low normal global LV function with an EF estimate at 50 to 55%.  There is mild residual inferior hypocontractility from the patient's initial 2005 inferior myocardial infarction.  LVEDP is 15 mm.  Coronary Diagrams  Diagnostic Dominance: Right  Intervention  Implants   No implant documentation for this case.  Syngo Images  Show images for CARDIAC CATHETERIZATION  Images on Long Term Storage  Show images for [place], [place] to Procedure Log  Procedure Log    Hemo Data   Most Recent Value  AO Systolic Pressure 123456 mmHg  AO Diastolic Pressure 55 mmHg  AO Mean 79 mmHg  LV Systolic Pressure 0000000 mmHg  LV Diastolic Pressure 2 mmHg  LV EDP 15 mmHg  AOp Systolic Pressure 123456 mmHg  AOp Diastolic Pressure 57 mmHg  AOp Mean Pressure 82 mmHg  LVp Systolic Pressure A999333 mmHg  LVp Diastolic Pressure 12 mmHg  LVp EDP Pressure 17 mmHg     Assessment/Plan:  This 68 year old gentleman has severe three-vessel coronary disease with progressive anginal symptoms occurring with exertion and heavy coughing episodes.  I agree that coronary artery bypass graft surgery is the best treatment to prevent further ischemia and infarction and improve his quality of life. I discussed the operative procedure with the patient including alternatives, benefits and risks; including but not  limited to bleeding, blood transfusion, infection, stroke, myocardial infarction, graft failure, heart block requiring a permanent pacemaker, organ dysfunction, and death.  He is somewhat in denial and is not sure that he wants to proceed with surgery.  He would like to think about it further and discuss it with cardiology.  His last dose of Plavix was this morning so if he decides that he does want to proceed with surgery I would plan on doing it on Friday.  I told him I will return tomorrow to see if he had any further questions.  I spent 40 minutes performing this consultation and > 50% of this time was spent face to face counseling and coordinating the care of this patient's  severe three-vessel coronary disease.   Gaye Pollack 07/22/2019, 3:51 PM

## 2019-07-22 NOTE — Progress Notes (Signed)
Progress Note  Patient Name: Jonathan [name] Date of Encounter: 07/22/2019  Primary Cardiologist: Dr Claiborne Billings  Subjective   Some SOB last evening; chest pain that increases with cough  Inpatient Medications    Scheduled Meds: . aspirin EC  81 mg Oral q morning - 10a  . clopidogrel  75 mg Oral q morning - 123XX123  . folic acid  1 mg Oral Daily  . LORazepam  0-4 mg Oral Q6H   Followed by  . LORazepam  0-4 mg Oral Q12H  . metoprolol succinate  25 mg Oral q morning - 10a  . mometasone-formoterol  2 puff Inhalation BID  . multivitamin with minerals  1 tablet Oral Daily  . nitroGLYCERIN  1 inch Topical Q6H  . ramipril  10 mg Oral q morning - 10a  . sodium chloride flush  3 mL Intravenous Q12H  . sodium chloride flush  3 mL Intravenous Q12H  . thiamine  100 mg Oral Daily   Or  . thiamine  100 mg Intravenous Daily   Continuous Infusions: . sodium chloride    . sodium chloride    . sodium chloride 1 mL/kg/hr (07/22/19 [id number])  . heparin 1,100 Units/hr (07/21/19 1344)   PRN Meds: sodium chloride, sodium chloride, acetaminophen, albuterol, guaiFENesin-dextromethorphan, LORazepam **OR** LORazepam, morphine injection, nitroGLYCERIN, ondansetron (ZOFRAN) IV, oxyCODONE-acetaminophen, sodium chloride flush, sodium chloride flush   Vital Signs    Vitals:   07/21/19 1048 07/21/19 2056 07/21/19 2200 07/22/19 0600  BP: 122/74  (!) 128/57   Pulse:   (!) 49   Resp:   14   Temp:   97.7 F (36.5 C)   TempSrc:   Oral   SpO2:  94% 93%   Weight:    94.7 kg  Height:        Intake/Output Summary (Last 24 hours) at 07/22/2019 0902 Last data filed at 07/22/2019 0200 Gross per 24 hour  Intake 198.13 ml  Output 300 ml  Net -101.87 ml   Last 3 Weights 07/22/2019 07/21/2019 07/20/2019  Weight (lbs) 208 lb 12.8 oz 208 lb 3.2 oz 210 lb  Weight (kg) 94.711 kg 94.439 kg 95.255 kg      Telemetry    Sinus with 10 beats NSVT- Personally Reviewed   Physical Exam   GEN: No acute distress.  Anxious  Neck: No JVD Cardiac: RRR, no murmurs, rubs, or gallops.  Respiratory: Clear to auscultation bilaterally. GI: Soft, nontender, non-distended  MS: No edema Neuro:  Nonfocal  Psych: Normal affect   Labs    High Sensitivity Troponin:   Recent Labs  Lab 07/20/19 1134 07/20/19 1441 07/21/19 0338  TROPONINIHS 84* 441* 329*      Chemistry Recent Labs  Lab 07/20/19 1134 07/21/19 0338 07/22/19 0525 07/22/19 0700  NA 135 133* 133* 135  K 4.7 4.5 5.7* 3.7  CL 97* 95* 97* 97*  CO2 19* 23 25 24   GLUCOSE 90 139* 96 96  BUN 14 23 29* 27*  CREATININE 1.40* 1.59* 1.34* 1.47*  CALCIUM 8.8* 9.0 8.6* 8.9  PROT 7.6  --   --   --   ALBUMIN 4.1  --   --   --   AST 32  --   --   --   ALT 18  --   --   --   ALKPHOS 52  --   --   --   BILITOT 0.8  --   --   --   GFRNONAA 51* 43* 53*  48*  GFRAA 59* 50* >60 55*  ANIONGAP 19* 15 11 14      Hematology Recent Labs  Lab 07/20/19 1134 07/21/19 0338 07/22/19 0403  WBC 14.2* 11.0* 13.7*  RBC 5.04 4.86 4.39  HGB 16.6 15.8 14.4  HCT 49.6 47.2 43.4  MCV 98.4 97.1 98.9  MCH 32.9 32.5 32.8  MCHC 33.5 33.5 33.2  RDW 13.2 13.1 13.4  PLT 265 254 212     DDimer  Recent Labs  Lab 07/20/19 1134  DDIMER 0.39     Radiology    Dg Chest 2 View  Result Date: 07/20/2019 CLINICAL DATA:  Pt reports central CP x 6 months EXAM: CHEST - 2 VIEW COMPARISON:  Chest radiograph 04/16/2018 FINDINGS: There is some narrowing in the mid trachea which may be positional. Normal heart size. The lungs are clear. No pneumothorax or pleural effusion. No acute finding in the visualized skeleton. IMPRESSION: No active cardiopulmonary disease. There is some narrowing in the mid trachea which may be positional. Electronically Signed   By: Audie Pinto M.D.   On: 07/20/2019 12:47    Patient Profile     68 y.o. male with past medical history of coronary artery disease, hypertension, hyperlipidemia, tobacco abuse, COPD admitted with chest pain and elevated  troponin.  Troponin increased from 84 to  441.  Echocardiogram technically difficult but shows normal LV function.  Assessment & Plan    1 chest pain-symptoms are atypical but troponin has increased.  Patient is for cardiac catheterization today.  The risks and benefits including myocardial infarction, CVA and death discussed and he agrees to proceed.  He has chronic stage III kidney disease.  Follow renal function closely after procedure.  Continue heparin, aspirin, Plavix, nitrates and beta-blocker.  2 chronic stage III kidney disease-follow renal function after procedure.  3 history of alcohol abuse  4 hyperlipidemia-patient apparently intolerant to statins with prior liver function and CK elevation.  Would consider Repatha as an outpatient.  5 DNR/DNI-patient adamant about respecting those wishes under any circumstances.  6 tobacco abuse-patient needs to discontinue.  For questions or updates, please contact [place] Please consult [email or web address] for contact info under        Signed, Kirk Ruths, MD  07/22/2019, 9:02 AM

## 2019-07-22 NOTE — Progress Notes (Addendum)
ANTICOAGULATION CONSULT NOTE - Follow Up Consult  Pharmacy Consult for Heparin Indication: chest pain/ACS   Patient Measurements: Height: 5\' 8"  (172.7 cm) Weight: 208 lb 12.8 oz (94.7 kg) IBW/kg (Calculated) : 68.4 Heparin Dosing Weight: 88.4 kg  Vital Signs: BP: 130/69 (11/09 1008) Pulse Rate: 0 (11/09 1013)  Labs: Recent Labs    07/20/19 1134 07/20/19 1441  07/21/19 0338 07/21/19 1104 07/21/19 2156 07/22/19 0403 07/22/19 0525 07/22/19 0700  HGB 16.6  --   --  15.8  --   --  14.4  --   --   HCT 49.6  --   --  47.2  --   --  43.4  --   --   PLT 265  --   --  254  --   --  212  --   --   HEPARINUNFRC  --   --    < > 0.44 <0.10* 0.46 0.47  --   --   CREATININE 1.40*  --   --  1.59*  --   --   --  1.34* 1.47*  TROPONINIHS 84* 441*  --  329*  --   --   --   --   --    < > = values in this interval not displayed.     Medical History: Past Medical History:  Diagnosis Date  . Arthritis    hands   . Chronic bronchitis ([place])   . Hyperlipidemia    has not tolerated statins,  would not pursue PSK9 inhibitor as advised by Dr Claiborne Billings  . Hypertension   . Myocardial infarction ([place])   . Tobacco abuse      Assessment: 37 male admitted on 07/20/2019 with chest pain and SOB. Pharmacy consulted to dose heparin for possible NSTEMI. He is s/p cath with heparin to restart 8 hours post sheath removal -heparin level at goal, hg stable -last heparin lvel was 0.47 on 1100 units/hr   Goal of Therapy:  Heparin level 0.3-0.7 units/ml Monitor platelets by anticoagulation protocol: Yes   Plan:  -restart heparin at 1100 units/hr 8 hours post sheath removal -Heparin level in 8 hours and daily wth CBC daily   Hildred Laser, PharmD Clinical Pharmacist **Pharmacist phone directory can now be found on [email or web address] ([email or web address]).  Listed under [place].

## 2019-07-22 NOTE — Progress Notes (Signed)
TR BAND REMOVAL  LOCATION:    right radial  DEFLATED PER PROTOCOL:    Yes.    TIME BAND OFF / DRESSING APPLIED:    1300  SITE UPON ARRIVAL:    Level 0  SITE AFTER BAND REMOVAL:    Level 0  CIRCULATION SENSATION AND MOVEMENT:    Within Normal Limits   Yes.    COMMENTS:   Rechecked at 1330 with no change. Frequent checks found bloody shadowing on gauze at 1430. Following checks dressing remained unchanged with bloody shadowing remaining unchanged after 1430. Patient reminded to not use arm. Noted frequently using arm and moving wrist.

## 2019-07-22 NOTE — Plan of Care (Signed)

## 2019-07-22 NOTE — H&P (View-Only) (Signed)
Progress Note  Patient Name: Jonathan [name] Date of Encounter: 07/22/2019  Primary Cardiologist: Dr Claiborne Billings  Subjective   Some SOB last evening; chest pain that increases with cough  Inpatient Medications    Scheduled Meds: . aspirin EC  81 mg Oral q morning - 10a  . clopidogrel  75 mg Oral q morning - 123XX123  . folic acid  1 mg Oral Daily  . LORazepam  0-4 mg Oral Q6H   Followed by  . LORazepam  0-4 mg Oral Q12H  . metoprolol succinate  25 mg Oral q morning - 10a  . mometasone-formoterol  2 puff Inhalation BID  . multivitamin with minerals  1 tablet Oral Daily  . nitroGLYCERIN  1 inch Topical Q6H  . ramipril  10 mg Oral q morning - 10a  . sodium chloride flush  3 mL Intravenous Q12H  . sodium chloride flush  3 mL Intravenous Q12H  . thiamine  100 mg Oral Daily   Or  . thiamine  100 mg Intravenous Daily   Continuous Infusions: . sodium chloride    . sodium chloride    . sodium chloride 1 mL/kg/hr (07/22/19 [id number])  . heparin 1,100 Units/hr (07/21/19 1344)   PRN Meds: sodium chloride, sodium chloride, acetaminophen, albuterol, guaiFENesin-dextromethorphan, LORazepam **OR** LORazepam, morphine injection, nitroGLYCERIN, ondansetron (ZOFRAN) IV, oxyCODONE-acetaminophen, sodium chloride flush, sodium chloride flush   Vital Signs    Vitals:   07/21/19 1048 07/21/19 2056 07/21/19 2200 07/22/19 0600  BP: 122/74  (!) 128/57   Pulse:   (!) 49   Resp:   14   Temp:   97.7 F (36.5 C)   TempSrc:   Oral   SpO2:  94% 93%   Weight:    94.7 kg  Height:        Intake/Output Summary (Last 24 hours) at 07/22/2019 0902 Last data filed at 07/22/2019 0200 Gross per 24 hour  Intake 198.13 ml  Output 300 ml  Net -101.87 ml   Last 3 Weights 07/22/2019 07/21/2019 07/20/2019  Weight (lbs) 208 lb 12.8 oz 208 lb 3.2 oz 210 lb  Weight (kg) 94.711 kg 94.439 kg 95.255 kg      Telemetry    Sinus with 10 beats NSVT- Personally Reviewed   Physical Exam   GEN: No acute distress.  Anxious  Neck: No JVD Cardiac: RRR, no murmurs, rubs, or gallops.  Respiratory: Clear to auscultation bilaterally. GI: Soft, nontender, non-distended  MS: No edema Neuro:  Nonfocal  Psych: Normal affect   Labs    High Sensitivity Troponin:   Recent Labs  Lab 07/20/19 1134 07/20/19 1441 07/21/19 0338  TROPONINIHS 84* 441* 329*      Chemistry Recent Labs  Lab 07/20/19 1134 07/21/19 0338 07/22/19 0525 07/22/19 0700  NA 135 133* 133* 135  K 4.7 4.5 5.7* 3.7  CL 97* 95* 97* 97*  CO2 19* 23 25 24   GLUCOSE 90 139* 96 96  BUN 14 23 29* 27*  CREATININE 1.40* 1.59* 1.34* 1.47*  CALCIUM 8.8* 9.0 8.6* 8.9  PROT 7.6  --   --   --   ALBUMIN 4.1  --   --   --   AST 32  --   --   --   ALT 18  --   --   --   ALKPHOS 52  --   --   --   BILITOT 0.8  --   --   --   GFRNONAA 51* 43* 53*  48*  GFRAA 59* 50* >60 55*  ANIONGAP 19* 15 11 14      Hematology Recent Labs  Lab 07/20/19 1134 07/21/19 0338 07/22/19 0403  WBC 14.2* 11.0* 13.7*  RBC 5.04 4.86 4.39  HGB 16.6 15.8 14.4  HCT 49.6 47.2 43.4  MCV 98.4 97.1 98.9  MCH 32.9 32.5 32.8  MCHC 33.5 33.5 33.2  RDW 13.2 13.1 13.4  PLT 265 254 212     DDimer  Recent Labs  Lab 07/20/19 1134  DDIMER 0.39     Radiology    Dg Chest 2 View  Result Date: 07/20/2019 CLINICAL DATA:  Pt reports central CP x 6 months EXAM: CHEST - 2 VIEW COMPARISON:  Chest radiograph 04/16/2018 FINDINGS: There is some narrowing in the mid trachea which may be positional. Normal heart size. The lungs are clear. No pneumothorax or pleural effusion. No acute finding in the visualized skeleton. IMPRESSION: No active cardiopulmonary disease. There is some narrowing in the mid trachea which may be positional. Electronically Signed   By: Audie Pinto M.D.   On: 07/20/2019 12:47    Patient Profile     68 y.o. male with past medical history of coronary artery disease, hypertension, hyperlipidemia, tobacco abuse, COPD admitted with chest pain and elevated  troponin.  Troponin increased from 84 to  441.  Echocardiogram technically difficult but shows normal LV function.  Assessment & Plan    1 chest pain-symptoms are atypical but troponin has increased.  Patient is for cardiac catheterization today.  The risks and benefits including myocardial infarction, CVA and death discussed and he agrees to proceed.  He has chronic stage III kidney disease.  Follow renal function closely after procedure.  Continue heparin, aspirin, Plavix, nitrates and beta-blocker.  2 chronic stage III kidney disease-follow renal function after procedure.  3 history of alcohol abuse  4 hyperlipidemia-patient apparently intolerant to statins with prior liver function and CK elevation.  Would consider Repatha as an outpatient.  5 DNR/DNI-patient adamant about respecting those wishes under any circumstances.  6 tobacco abuse-patient needs to discontinue.  For questions or updates, please contact [place] Please consult [email or web address] for contact info under        Signed, Kirk Ruths, MD  07/22/2019, 9:02 AM

## 2019-07-22 NOTE — Interval H&P Note (Signed)
History and Physical Interval Note:  07/22/2019 9:33 AM Cath Lab Visit (complete for each Cath Lab visit)  Clinical Evaluation Leading to the Procedure:   ACS: No.  Non-ACS:    Anginal Classification: CCS III  Anti-ischemic medical therapy: Minimal Therapy (1 class of medications)  Non-Invasive Test Results: No non-invasive testing performed  Prior CABG: No previous CABG        Jonathan [name]  has presented today for surgery, with the diagnosis of NSTEMI.  The various methods of treatment have been discussed with the patient and family. After consideration of risks, benefits and other options for treatment, the patient has consented to  Procedure(s): LEFT HEART CATH AND CORONARY ANGIOGRAPHY (N/A) as a surgical intervention.  The patient's history has been reviewed, patient examined, no change in status, stable for surgery.  I have reviewed the patient's chart and labs.  Questions were answered to the patient's satisfaction.     Shelva Majestic

## 2019-07-23 ENCOUNTER — Other Ambulatory Visit: Payer: Self-pay

## 2019-07-23 ENCOUNTER — Inpatient Hospital Stay (HOSPITAL_COMMUNITY): Payer: Medicare Other

## 2019-07-23 ENCOUNTER — Encounter (HOSPITAL_COMMUNITY): Payer: Self-pay | Admitting: General Practice

## 2019-07-23 DIAGNOSIS — Z01810 Encounter for preprocedural cardiovascular examination: Secondary | ICD-10-CM

## 2019-07-23 LAB — CBC
HCT: 40.4 % (ref 39.0–52.0)
Hemoglobin: 13.6 g/dL (ref 13.0–17.0)
MCH: 33.2 pg (ref 26.0–34.0)
MCHC: 33.7 g/dL (ref 30.0–36.0)
MCV: 98.5 fL (ref 80.0–100.0)
Platelets: 187 10*3/uL (ref 150–400)
RBC: 4.1 MIL/uL — ABNORMAL LOW (ref 4.22–5.81)
RDW: 13.4 % (ref 11.5–15.5)
WBC: 7.5 10*3/uL (ref 4.0–10.5)
nRBC: 0 % (ref 0.0–0.2)

## 2019-07-23 LAB — BASIC METABOLIC PANEL
Anion gap: 9 (ref 5–15)
BUN: 20 mg/dL (ref 8–23)
CO2: 27 mmol/L (ref 22–32)
Calcium: 8.4 mg/dL — ABNORMAL LOW (ref 8.9–10.3)
Chloride: 100 mmol/L (ref 98–111)
Creatinine, Ser: 1.13 mg/dL (ref 0.61–1.24)
GFR calc Af Amer: >60 mL/min (ref >60)
GFR calc non Af Amer: >60 mL/min (ref >60)
Glucose, Bld: 93 mg/dL (ref 70–99)
Potassium: 4.2 mmol/L (ref 3.5–5.1)
Sodium: 136 mmol/L (ref 135–145)

## 2019-07-23 LAB — HEPARIN LEVEL (UNFRACTIONATED)
Heparin Unfractionated: 0.23 IU/mL — ABNORMAL LOW (ref 0.30–0.70)
Heparin Unfractionated: 0.41 IU/mL (ref 0.30–0.70)

## 2019-07-23 MED ORDER — CLOPIDOGREL BISULFATE 75 MG PO TABS
75.0000 mg | ORAL_TABLET | Freq: Every day | ORAL | Status: DC
  Administered 2019-07-23 – 2019-07-25 (×3): 75 mg via ORAL
  Filled 2019-07-23 (×3): qty 1

## 2019-07-23 NOTE — Progress Notes (Addendum)
Progress Note  Patient Name: Jonathan [name] Date of Encounter: 07/23/2019  Primary Cardiologist: Dr Claiborne Billings  Subjective   Mild continuous CP; no dyspnea  Inpatient Medications    Scheduled Meds: . aspirin  81 mg Oral Daily  . aspirin EC  81 mg Oral q morning - 123XX123  . folic acid  1 mg Oral Daily  . isosorbide mononitrate  30 mg Oral Daily  . LORazepam  0-4 mg Oral Q12H  . metoprolol succinate  25 mg Oral q morning - 10a  . mometasone-formoterol  2 puff Inhalation BID  . multivitamin with minerals  1 tablet Oral Daily  . ramipril  10 mg Oral q morning - 10a  . sodium chloride flush  3 mL Intravenous Q12H  . thiamine  100 mg Oral Daily   Or  . thiamine  100 mg Intravenous Daily   Continuous Infusions: . sodium chloride 50 mL/hr at 07/23/19 0600  . sodium chloride    . sodium chloride 50 mL/hr at 07/22/19 1845  . heparin 1,250 Units/hr (07/23/19 0600)   PRN Meds: sodium chloride, acetaminophen, diazepam, guaiFENesin-dextromethorphan, levalbuterol, LORazepam **OR** LORazepam, morphine injection, nitroGLYCERIN, ondansetron (ZOFRAN) IV, oxyCODONE-acetaminophen, sodium chloride flush   Vital Signs    Vitals:   07/22/19 1013 07/22/19 1234 07/22/19 2052 07/22/19 2141  BP:  (!) 127/56 (!) 108/57   Pulse: (!) 0 (!) 53 (!) 49   Resp: 10 20    Temp:  98.6 F (37 C) 98 F (36.7 C)   TempSrc:  Oral Oral   SpO2: 94% 90% 92% 95%  Weight:      Height:        Intake/Output Summary (Last 24 hours) at 07/23/2019 0824 Last data filed at 07/23/2019 0600 Gross per 24 hour  Intake 2113.29 ml  Output 850 ml  Net 1263.29 ml   Last 3 Weights 07/22/2019 07/21/2019 07/20/2019  Weight (lbs) 208 lb 12.8 oz 208 lb 3.2 oz 210 lb  Weight (kg) 94.711 kg 94.439 kg 95.255 kg      Telemetry    Sinus- Personally Reviewed   Physical Exam   GEN: WD NAD Neck: No JVD, supple Cardiac: RRR Respiratory: CTA GI: Soft, NT ND MS: No edema; radial cath site with no hematoma Neuro:  Grossly  intact Psych: Normal affect   Labs    High Sensitivity Troponin:   Recent Labs  Lab 07/20/19 1134 07/20/19 1441 07/21/19 0338  TROPONINIHS 84* 441* 329*      Chemistry Recent Labs  Lab 07/20/19 1134  07/22/19 0525 07/22/19 0700 07/23/19 0320  NA 135   < > 133* 135 136  K 4.7   < > 5.7* 3.7 4.2  CL 97*   < > 97* 97* 100  CO2 19*   < > 25 24 27   GLUCOSE 90   < > 96 96 93  BUN 14   < > 29* 27* 20  CREATININE 1.40*   < > 1.34* 1.47* 1.13  CALCIUM 8.8*   < > 8.6* 8.9 8.4*  PROT 7.6  --   --   --   --   ALBUMIN 4.1  --   --   --   --   AST 32  --   --   --   --   ALT 18  --   --   --   --   ALKPHOS 52  --   --   --   --   BILITOT 0.8  --   --   --   --  GFRNONAA 51*   < > 53* 48* >60  GFRAA 59*   < > >60 55* >60  ANIONGAP 19*   < > 11 14 9    < > = values in this interval not displayed.     Hematology Recent Labs  Lab 07/21/19 0338 07/22/19 0403 07/23/19 0320  WBC 11.0* 13.7* 7.5  RBC 4.86 4.39 4.10*  HGB 15.8 14.4 13.6  HCT 47.2 43.4 40.4  MCV 97.1 98.9 98.5  MCH 32.5 32.8 33.2  MCHC 33.5 33.2 33.7  RDW 13.1 13.4 13.4  PLT 254 212 187     DDimer  Recent Labs  Lab 07/20/19 1134  DDIMER 0.39     Patient Profile     68 y.o. male with past medical history of coronary artery disease, hypertension, hyperlipidemia, tobacco abuse, COPD admitted with chest pain and elevated troponin.  Troponin increased from 84 to  441.  Echocardiogram technically difficult but shows normal LV function.  Assessment & Plan    1 CAD-cath results noted.  Patient with severe three-vessel coronary artery disease.  Coronary artery bypass graft recommended.  I discussed this at length with patient today.  He declines surgery.  He would consider PCI.  I will review with Dr. Claiborne Billings to see if this is an option.  I explained to patient the risk of treating medically including recurrent myocardial infarction and death.  We will continue aspirin.  Resume Plavix 75 mg daily.  Continue  heparin for now but will discontinue if PCI not an option.  Continue beta-blocker and isosorbide.  2 chronic stage III kidney disease-follow renal function after procedure.  3 history of alcohol abuse  4 hyperlipidemia-patient apparently intolerant to statins with prior liver function and CK elevation.  Would consider Repatha as an outpatient.  5 DNR/DNI-patient adamant about respecting those wishes under any circumstances.  6 tobacco abuse-patient needs to discontinue.  For questions or updates, please contact [place] Please consult [email or web address] for contact info under        Signed, Kirk Ruths, MD  07/23/2019, 8:24 AM

## 2019-07-23 NOTE — Progress Notes (Signed)
Patient ID: Jonathan [name], male   DOB: Sep 08, 1951, 68 y.o.   MRN: [id number] TCTS:  Jonathan [name] has decided that he does not want CABG. I asked him if he had any other questions and he said no and did not want to talk about surgery anymore. His Plavix has been restarted and cardiology will decide about high risk PCI vs medical tx.

## 2019-07-23 NOTE — Plan of Care (Signed)

## 2019-07-23 NOTE — Progress Notes (Signed)
Pre op vascular       has been completed. Preliminary results can be found under CV proc through chart review. Kendrew Paci, BS, RDMS, RVT   

## 2019-07-23 NOTE — Progress Notes (Signed)
ANTICOAGULATION CONSULT NOTE - Follow Up Consult  Pharmacy Consult for Heparin Indication: severe three vessel CAD, pt considering CABG   Patient Measurements: Height: 5\' 8"  (172.7 cm) Weight: 208 lb 12.8 oz (94.7 kg) IBW/kg (Calculated) : 68.4 Heparin Dosing Weight: 88.4 kg  Vital Signs: Temp: 98 F (36.7 C) (11/09 2052) Temp Source: Oral (11/09 2052) BP: 108/57 (11/09 2052) Pulse Rate: 49 (11/09 2052)  Labs: Recent Labs    07/20/19 1134 07/20/19 1441 07/21/19 0338  07/21/19 2156 07/22/19 0403 07/22/19 0525 07/22/19 0700 07/23/19 0320  HGB 16.6  --  15.8  --   --  14.4  --   --  13.6  HCT 49.6  --  47.2  --   --  43.4  --   --  40.4  PLT 265  --  254  --   --  212  --   --  187  HEPARINUNFRC  --   --  0.44   < > 0.46 0.47  --   --  0.23*  CREATININE 1.40*  --  1.59*  --   --   --  1.34* 1.47*  --   TROPONINIHS 84* 441* 329*  --   --   --   --   --   --    < > = values in this interval not displayed.     Medical History: Past Medical History:  Diagnosis Date  . Arthritis    hands   . Chronic bronchitis ([place])   . Hyperlipidemia    has not tolerated statins,  would not pursue PSK9 inhibitor as advised by Dr Claiborne Billings  . Hypertension   . Myocardial infarction ([place])   . Tobacco abuse      Assessment: 72 male admitted on 07/20/2019 with chest pain and SOB. Pharmacy consulted to dose heparin for possible NSTEMI. He is s/p cath with heparin to restart 8 hours post sheath removal -heparin level at goal, hg stable -last heparin lvel was 0.47 on 1100 units/hr  11/10 AM update:  Heparin level low this AM after re-start s/p cath No issues per RN Pt deciding on CABG  Goal of Therapy:  Heparin level 0.3-0.7 units/ml Monitor platelets by anticoagulation protocol: Yes   Plan:  -Inc heparin to 1250 units/hr -Re-check heparin level in 8 hours -F/U CABG plans   Narda Bonds, PharmD, BCPS Clinical Pharmacist Phone: [phone]

## 2019-07-23 NOTE — Progress Notes (Signed)
ANTICOAGULATION CONSULT NOTE - Follow Up Consult  Pharmacy Consult for Heparin Indication: severe three vessel CAD, pt considering CABG   Patient Measurements: Height: 5\' 8"  (172.7 cm) Weight: 208 lb 12.8 oz (94.7 kg) IBW/kg (Calculated) : 68.4 Heparin Dosing Weight: 88.4 kg  Vital Signs: Temp: 98.3 F (36.8 C) (11/10 1451) Temp Source: Oral (11/10 1451) BP: 100/49 (11/10 1451) Pulse Rate: 50 (11/10 1451)  Labs: Recent Labs    07/21/19 0338  07/22/19 0403 07/22/19 0525 07/22/19 0700 07/23/19 0320 07/23/19 1259  HGB 15.8  --  14.4  --   --  13.6  --   HCT 47.2  --  43.4  --   --  40.4  --   PLT 254  --  212  --   --  187  --   HEPARINUNFRC 0.44   < > 0.47  --   --  0.23* 0.41  CREATININE 1.59*  --   --  1.34* 1.47* 1.13  --   TROPONINIHS 329*  --   --   --   --   --   --    < > = values in this interval not displayed.     Medical History: Past Medical History:  Diagnosis Date  . Arthritis    hands   . Chronic bronchitis ([place])   . Coronary artery disease   . Hyperlipidemia    has not tolerated statins,  would not pursue PSK9 inhibitor as advised by Dr Claiborne Billings  . Hypertension   . Myocardial infarction ([place])   . Tobacco abuse      Assessment: 21 male admitted on 07/20/2019 with chest pain and SOB. Pharmacy consulted to dose heparin for CP and now s/p cath with 3VCAD (patient does not want CABG). Possible plans for PCI -heparin level at goal  Goal of Therapy:  Heparin level 0.3-0.7 units/ml Monitor platelets by anticoagulation protocol: Yes   Plan:  -Continue heparin at 1250 units/hr -Daily heparin level and CBC  Hildred Laser, PharmD Clinical Pharmacist **Pharmacist phone directory can now be found on [email or web address] ([email or web address]).  Listed under [place].

## 2019-07-24 LAB — CBC
HCT: 42 % (ref 39.0–52.0)
Hemoglobin: 14 g/dL (ref 13.0–17.0)
MCH: 33.2 pg (ref 26.0–34.0)
MCHC: 33.3 g/dL (ref 30.0–36.0)
MCV: 99.5 fL (ref 80.0–100.0)
Platelets: 185 10*3/uL (ref 150–400)
RBC: 4.22 MIL/uL (ref 4.22–5.81)
RDW: 13.4 % (ref 11.5–15.5)
WBC: 8.3 10*3/uL (ref 4.0–10.5)
nRBC: 0 % (ref 0.0–0.2)

## 2019-07-24 LAB — HEPARIN LEVEL (UNFRACTIONATED): Heparin Unfractionated: 0.47 IU/mL (ref 0.30–0.70)

## 2019-07-24 MED ORDER — HEPARIN (PORCINE) 25000 UT/250ML-% IV SOLN
1250.0000 [IU]/h | INTRAVENOUS | Status: DC
  Administered 2019-07-24 (×2): 1250 [IU]/h via INTRAVENOUS
  Filled 2019-07-24: qty 250

## 2019-07-24 NOTE — Progress Notes (Signed)
ANTICOAGULATION CONSULT NOTE - Follow Up Consult  Pharmacy Consult for Heparin Indication: severe three vessel CAD   Patient Measurements: Height: 5\' 8"  (172.7 cm) Weight: 211 lb 9.6 oz (96 kg) IBW/kg (Calculated) : 68.4 Heparin Dosing Weight: 88.4 kg  Vital Signs: Temp: 97.7 F (36.5 C) (11/11 0554) Temp Source: Oral (11/11 0554) BP: 126/60 (11/11 0554) Pulse Rate: 50 (11/11 0554)  Labs: Recent Labs    07/22/19 0403 07/22/19 0525 07/22/19 0700 07/23/19 0320 07/23/19 1259 07/24/19 0250  HGB 14.4  --   --  13.6  --  14.0  HCT 43.4  --   --  40.4  --  42.0  PLT 212  --   --  187  --  185  HEPARINUNFRC 0.47  --   --  0.23* 0.41 0.47  CREATININE  --  1.34* 1.47* 1.13  --   --      Medical History: Past Medical History:  Diagnosis Date  . Arthritis    hands   . Chronic bronchitis ([place])   . Coronary artery disease   . Hyperlipidemia    has not tolerated statins,  would not pursue PSK9 inhibitor as advised by Dr Claiborne Billings  . Hypertension   . Myocardial infarction ([place])   . Tobacco abuse      Assessment: 56 male admitted on 07/20/2019 with chest pain and SOB. Pharmacy consulted to dose heparin for CP and now s/p cath with 3VCAD (patient does not want CABG). Possible plans for PCI -heparin level at goal  Goal of Therapy:  Heparin level 0.3-0.7 units/ml Monitor platelets by anticoagulation protocol: Yes   Plan:  -Continue heparin at 1250 units/hr -Daily heparin level and CBC  Hildred Laser, PharmD Clinical Pharmacist **Pharmacist phone directory can now be found on [email or web address] ([email or web address]).  Listed under [place].

## 2019-07-24 NOTE — Progress Notes (Signed)
Progress Note  Patient Name: Jonathan [name] Date of Encounter: 07/24/2019  Primary Cardiologist: Dr Claiborne Billings  Subjective   Mild CP with cough; mild dyspnea  Inpatient Medications    Scheduled Meds: . aspirin EC  81 mg Oral q morning - 10a  . clopidogrel  75 mg Oral Daily  . folic acid  1 mg Oral Daily  . isosorbide mononitrate  30 mg Oral Daily  . LORazepam  0-4 mg Oral Q12H  . metoprolol succinate  25 mg Oral q morning - 10a  . mometasone-formoterol  2 puff Inhalation BID  . multivitamin with minerals  1 tablet Oral Daily  . ramipril  10 mg Oral q morning - 10a  . sodium chloride flush  3 mL Intravenous Q12H  . thiamine  100 mg Oral Daily   Or  . thiamine  100 mg Intravenous Daily   Continuous Infusions: . sodium chloride Stopped (07/23/19 0700)  . sodium chloride    . sodium chloride 50 mL/hr at 07/23/19 2015  . heparin 1,250 Units/hr (07/23/19 2015)   PRN Meds: sodium chloride, acetaminophen, diazepam, guaiFENesin-dextromethorphan, levalbuterol, morphine injection, nitroGLYCERIN, ondansetron (ZOFRAN) IV, oxyCODONE-acetaminophen, sodium chloride flush   Vital Signs    Vitals:   07/23/19 1958 07/23/19 2007 07/23/19 2136 07/24/19 0554  BP:   (!) 96/53 126/60  Pulse:   (!) 51 (!) 50  Resp:      Temp:   (!) 97.4 F (36.3 C) 97.7 F (36.5 C)  TempSrc:   Oral Oral  SpO2: 95% 95% 96% 93%  Weight:    96 kg  Height:        Intake/Output Summary (Last 24 hours) at 07/24/2019 0741 Last data filed at 07/23/2019 2015 Gross per 24 hour  Intake 1439.86 ml  Output 400 ml  Net 1039.86 ml   Last 3 Weights 07/24/2019 07/22/2019 07/21/2019  Weight (lbs) 211 lb 9.6 oz 208 lb 12.8 oz 208 lb 3.2 oz  Weight (kg) 95.981 kg 94.711 kg 94.439 kg      Telemetry    Sinus with rare PVC- Personally Reviewed   Physical Exam   GEN: WD WN NAD Neck: supple Cardiac: RRR, no murmur Respiratory: CTA; no wheeze GI: Soft, NT ND, no masses MS: No edema Neuro:  No focal findings  Psych: Normal affect   Labs    High Sensitivity Troponin:   Recent Labs  Lab 07/20/19 1134 07/20/19 1441 07/21/19 0338  TROPONINIHS 84* 441* 329*      Chemistry Recent Labs  Lab 07/20/19 1134  07/22/19 0525 07/22/19 0700 07/23/19 0320  NA 135   < > 133* 135 136  K 4.7   < > 5.7* 3.7 4.2  CL 97*   < > 97* 97* 100  CO2 19*   < > 25 24 27   GLUCOSE 90   < > 96 96 93  BUN 14   < > 29* 27* 20  CREATININE 1.40*   < > 1.34* 1.47* 1.13  CALCIUM 8.8*   < > 8.6* 8.9 8.4*  PROT 7.6  --   --   --   --   ALBUMIN 4.1  --   --   --   --   AST 32  --   --   --   --   ALT 18  --   --   --   --   ALKPHOS 52  --   --   --   --   BILITOT  0.8  --   --   --   --   GFRNONAA 51*   < > 53* 48* >60  GFRAA 59*   < > >60 55* >60  ANIONGAP 19*   < > 11 14 9    < > = values in this interval not displayed.     Hematology Recent Labs  Lab 07/22/19 0403 07/23/19 0320 07/24/19 0250  WBC 13.7* 7.5 8.3  RBC 4.39 4.10* 4.22  HGB 14.4 13.6 14.0  HCT 43.4 40.4 42.0  MCV 98.9 98.5 99.5  MCH 32.8 33.2 33.2  MCHC 33.2 33.7 33.3  RDW 13.4 13.4 13.4  PLT 212 187 185     DDimer  Recent Labs  Lab 07/20/19 1134  DDIMER 0.39     Patient Profile     68 y.o. male with past medical history of coronary artery disease, hypertension, hyperlipidemia, tobacco abuse, COPD admitted with chest pain and elevated troponin.  Troponin increased from 84 to  441.  Echocardiogram technically difficult but shows normal LV function.  Assessment & Plan    1 CAD-I reviewed the patient's cardiac catheterization films with Dr. Angelena Form and discussed with Dr. Claiborne Billings yesterday.  He has an ostial left circumflex lesion that would be difficult to approach percutaneously.  Stent would likely extend into the left main and also potentially compromise the LAD.  They feel that percutaneous intervention is not an option.  I discussed this with patient today and he continues to decline coronary artery bypass and graft and  requests only medical therapy.  He had some chest pain this morning that increases with cough.  I will continue heparin today and discontinue tomorrow with potential discharge tomorrow on medications.  Patient understands the risk of medical therapy including myocardial infarction and death.  Continue aspirin, Plavix, Toprol and isosorbide.  Patient is intolerant to statins.    2 chronic stage III kidney disease-Recheck BMET in AM.  3 history of alcohol abuse  4 hyperlipidemia-patient apparently intolerant to statins with prior liver function and CK elevation.  Would consider Repatha as an outpatient.  5 DNR/DNI-patient adamant about respecting those wishes under any circumstances.  6 tobacco abuse-patient needs to discontinue.  7 Carotid artery disease-40-59% left stenosis.  Continue aspirin.  Consider Repatha as an outpatient.  For questions or updates, please contact [place] Please consult [email or web address] for contact info under        Signed, Kirk Ruths, MD  07/24/2019, 7:41 AM

## 2019-07-25 ENCOUNTER — Telehealth: Payer: Self-pay | Admitting: Cardiovascular Disease

## 2019-07-25 LAB — CBC
HCT: 41.8 % (ref 39.0–52.0)
Hemoglobin: 14 g/dL (ref 13.0–17.0)
MCH: 32.8 pg (ref 26.0–34.0)
MCHC: 33.5 g/dL (ref 30.0–36.0)
MCV: 97.9 fL (ref 80.0–100.0)
Platelets: 197 10*3/uL (ref 150–400)
RBC: 4.27 MIL/uL (ref 4.22–5.81)
RDW: 13.4 % (ref 11.5–15.5)
WBC: 8.6 10*3/uL (ref 4.0–10.5)
nRBC: 0 % (ref 0.0–0.2)

## 2019-07-25 LAB — BASIC METABOLIC PANEL
Anion gap: 10 (ref 5–15)
BUN: 13 mg/dL (ref 8–23)
CO2: 25 mmol/L (ref 22–32)
Calcium: 8.8 mg/dL — ABNORMAL LOW (ref 8.9–10.3)
Chloride: 104 mmol/L (ref 98–111)
Creatinine, Ser: 1.19 mg/dL (ref 0.61–1.24)
GFR calc Af Amer: >60 mL/min (ref >60)
GFR calc non Af Amer: >60 mL/min (ref >60)
Glucose, Bld: 92 mg/dL (ref 70–99)
Potassium: 3.9 mmol/L (ref 3.5–5.1)
Sodium: 139 mmol/L (ref 135–145)

## 2019-07-25 LAB — HEPARIN LEVEL (UNFRACTIONATED): Heparin Unfractionated: 0.61 IU/mL (ref 0.30–0.70)

## 2019-07-25 MED ORDER — ISOSORBIDE MONONITRATE ER 30 MG PO TB24: 30.0000 mg | ORAL_TABLET | Freq: Every day | ORAL | 6 refills | Status: DC

## 2019-07-25 MED ORDER — NITROGLYCERIN 0.4 MG SL SUBL: 0.4000 mg | SUBLINGUAL_TABLET | SUBLINGUAL | 12 refills | Status: DC | PRN

## 2019-07-25 NOTE — Telephone Encounter (Signed)
Per Robbie Lis, pt is going to see Roby Lofts on 08/02/19 at 9:00am

## 2019-07-25 NOTE — Progress Notes (Signed)
Progress Note  Patient Name: Jonathan [name] Date of Encounter: 07/25/2019  Primary Cardiologist: Dr Claiborne Billings  Subjective   Dyspnea at baseline; no chest pain  Inpatient Medications    Scheduled Meds: . aspirin EC  81 mg Oral q morning - 10a  . clopidogrel  75 mg Oral Daily  . folic acid  1 mg Oral Daily  . isosorbide mononitrate  30 mg Oral Daily  . metoprolol succinate  25 mg Oral q morning - 10a  . mometasone-formoterol  2 puff Inhalation BID  . multivitamin with minerals  1 tablet Oral Daily  . ramipril  10 mg Oral q morning - 10a  . sodium chloride flush  3 mL Intravenous Q12H  . thiamine  100 mg Oral Daily   Or  . thiamine  100 mg Intravenous Daily   Continuous Infusions: . sodium chloride Stopped (07/23/19 0700)  . sodium chloride 10 mL/hr at 07/24/19 0900  . sodium chloride Stopped (07/24/19 0900)  . heparin 1,250 Units/hr (07/25/19 0400)   PRN Meds: sodium chloride, acetaminophen, diazepam, guaiFENesin-dextromethorphan, levalbuterol, morphine injection, nitroGLYCERIN, ondansetron (ZOFRAN) IV, oxyCODONE-acetaminophen, sodium chloride flush   Vital Signs    Vitals:   07/24/19 1451 07/24/19 1921 07/24/19 2057 07/25/19 0537  BP: 106/61  (!) 102/50 (!) 156/73  Pulse: (!) 51  (!) 48 (!) 47  Resp:      Temp: 98.4 F (36.9 C)  98.4 F (36.9 C) 97.9 F (36.6 C)  TempSrc: Oral  Oral Oral  SpO2: 97% 96% 98% 97%  Weight:    96.4 kg  Height:        Intake/Output Summary (Last 24 hours) at 07/25/2019 0731 Last data filed at 07/25/2019 0400 Gross per 24 hour  Intake 975.85 ml  Output -  Net 975.85 ml   Last 3 Weights 07/25/2019 07/24/2019 07/22/2019  Weight (lbs) 212 lb 8 oz 211 lb 9.6 oz 208 lb 12.8 oz  Weight (kg) 96.389 kg 95.981 kg 94.711 kg      Telemetry    Sinus- Personally Reviewed   Physical Exam   GEN: NAD Neck: supple, no JVD Cardiac: RRR, no murmur Respiratory: CTA; no rhonchi GI: Soft, NT ND, no masses MS: No edema; radial cath site  with no hematoma Neuro:  No focal findings Psych: Normal affect   Labs    High Sensitivity Troponin:   Recent Labs  Lab 07/20/19 1134 07/20/19 1441 07/21/19 0338  TROPONINIHS 84* 441* 329*      Chemistry Recent Labs  Lab 07/20/19 1134  07/22/19 0700 07/23/19 0320 07/25/19 0341  NA 135   < > 135 136 139  K 4.7   < > 3.7 4.2 3.9  CL 97*   < > 97* 100 104  CO2 19*   < > 24 27 25   GLUCOSE 90   < > 96 93 92  BUN 14   < > 27* 20 13  CREATININE 1.40*   < > 1.47* 1.13 1.19  CALCIUM 8.8*   < > 8.9 8.4* 8.8*  PROT 7.6  --   --   --   --   ALBUMIN 4.1  --   --   --   --   AST 32  --   --   --   --   ALT 18  --   --   --   --   ALKPHOS 52  --   --   --   --   BILITOT 0.8  --   --   --   --  GFRNONAA 51*   < > 48* >60 >60  GFRAA 59*   < > 55* >60 >60  ANIONGAP 19*   < > 14 9 10    < > = values in this interval not displayed.     Hematology Recent Labs  Lab 07/23/19 0320 07/24/19 0250 07/25/19 0341  WBC 7.5 8.3 8.6  RBC 4.10* 4.22 4.27  HGB 13.6 14.0 14.0  HCT 40.4 42.0 41.8  MCV 98.5 99.5 97.9  MCH 33.2 33.2 32.8  MCHC 33.7 33.3 33.5  RDW 13.4 13.4 13.4  PLT 187 185 197     DDimer  Recent Labs  Lab 07/20/19 1134  DDIMER 0.39     Patient Profile     68 y.o. male with past medical history of coronary artery disease, hypertension, hyperlipidemia, tobacco abuse, COPD admitted with chest pain and elevated troponin.  Troponin increased from 84 to  441.  Echocardiogram technically difficult but shows normal LV function.  Assessment & Plan    1 CAD-I previously reviewed the patient's cardiac catheterization films with Dr. Angelena Form and discussed with Dr. Claiborne Billings.  He has an ostial left circumflex lesion that would be difficult to approach percutaneously.  Stent would likely extend into the left main and also potentially compromise the LAD.  They feel that percutaneous intervention is not an option.  I discussed this with patient previously and he requests only medical  therapy; refuses CABG.  Patient understands the risk of medical therapy including myocardial infarction and death.  DC heparin. Continue aspirin, Plavix, Toprol and isosorbide.  Patient is intolerant to statins.    2 chronic stage III kidney disease-Renal function unchanged this AM.   3 history of alcohol abuse  4 hyperlipidemia-patient apparently intolerant to statins with prior liver function and CK elevation.  Would consider Repatha as an outpatient.  5 DNR/DNI-patient adamant about respecting those wishes under any circumstances.  6 tobacco abuse-patient needs to discontinue.  7 Carotid artery disease-40-59% left stenosis.  Continue aspirin.  Consider Repatha as an outpatient.  8 hypertension-blood pressure is controlled.  Continue present medications.  Note valsartan has not been resumed.  Discharge today with transition of care appointment 1 to 2 weeks with APP.  Follow-up Dr. Claiborne Billings 8 weeks. Greater than 30 minutes PA and physician time. D2  For questions or updates, please contact [place] Please consult [email or web address] for contact info under        Signed, Kirk Ruths, MD  07/25/2019, 7:31 AM

## 2019-07-25 NOTE — Telephone Encounter (Signed)
Still admitted-07/20/2019 - present (5 days) Jonathan [name]

## 2019-07-25 NOTE — Discharge Summary (Signed)
Discharge Summary    Patient ID: Jonathan [name] MRN: [id number]; DOB: [birth year]  Admit date: 07/20/2019 Discharge date: 07/25/2019  Primary Care Provider: Leonard Downing, MD  Primary Cardiologist: Shelva Majestic, MD   Discharge Diagnoses    Active Problems:   NSTEMI (non-ST elevated myocardial infarction) [facility])   Coronary artery disease involving native coronary artery of native heart with unstable angina pectoris ([place])   Tobacco abuse  AKI   HTN   HLD  Alcohol abuse  Carotid artery disease   Diagnostic Studies/Procedures    Echo 07/21/2019 1. Extremely limited images despite use of Definity.  2. Definity contrast agent was given IV to delineate the left ventricular endocardial borders.  3. Left ventricular ejection fraction, by visual estimation, is approximately 55 to 60%. The left ventricle has normal function. There is no left ventricular hypertrophy. Cannot comment of focal wall motion.  4. Global right ventricle was not well visualized.The right ventricular size is not well visualized. Right vetricular wall thickness was not assessed.  5. Left atrial size was not well visualized.  6. Right atrial size was not well visualized.  7. Presence of pericardial fat pad.  8. The mitral valve was not well visualized. No evidence of mitral valve regurgitation.  9. The tricuspid valve is not well visualized. Tricuspid valve regurgitation not well evaluated. 10. The aortic valve was not well visualized. Aortic valve regurgitation is not visualized. 11. The pulmonic valve was not well visualized. Pulmonic valve regurgitation is not visualized. 12. The aortic root was not well visualized. 13. The inferior vena cava appears dilated, but difficult to estimate CVP. 14. The interatrial septum was not well visualized.  LEFT HEART CATH AND CORONARY ANGIOGRAPHY 07/22/2019  Conclusion    Prox RCA to Mid RCA lesion is 80% stenosed.  Prox RCA lesion is 40% stenosed with 30%  stenosed side branch in RV Branch.  Mid RCA to Dist RCA lesion is 70% stenosed.  Ost Cx to Prox Cx lesion is 95% stenosed.  1st Diag lesion is 90% stenosed.  Mid LAD lesion is 70% stenosed.   Significant multivessel CAD with 90% diffuse proximal stenosis in the first diagonal branch of the LAD with 70% LAD stenosis beyond the diagonal takeoff; 95% ostial left circumflex stenosis with the vessel immediately bifurcating into the AV groove and circumflex; and mild to moderate in-stent narrowing of approximately 40% in the previously placed proximal RCA stent with focal 80% mid distal in-stent restenosis and new 70% stenosis beyond the stented segment proximal to the acute margin and a large dominant RCA.  Low normal global LV contractility with EF estimate at 50 to 55% with mild residual inferior hypocontractility sustained in 2005.  LVEDP 15 mm  RECOMMENDATION: I had a long discussion with the patient prior to the catheterization and he agreed to rescind his DNR for the procedure in the event countershock or CPR was necessary.  After review of the angiographic findings in detail with the patient as well as the description of good target vessels the patient now seems amenable to consider possible CABG revascularization surgery.  Will obtain surgical consultation.  Plavix will be held; consider P212 testing in several days if the plan is to move forward with CABG revascularization surgery.  Will initiate heparin therapy 8 hours post TR band.   Diagnostic Dominance: Right      History of Present Illness     Jonathan [name] is a 68 y.o. male with  hx of known CAD s/p  PCI in 2005 with HTN, HL with statin intolerance and ongoing tobacco admitted with NSTEMI.   He is a poor historian.  He reported that he has had episodic chest pain since January.  He was not clear as to whether episodes are exertional.  He does have occasional chest wall pain associated with cough.  Recently, he has found his non  cough related chest pain to be "severe".  He has been drinking alcohol to treat his pain but has not sought medical attention and has not seen Dr Claiborne Billings recently.   He underwent PCI in 2005 in the setting of Acute MI but has not had substantial cardiac issues since that time.  EKG without specific ischemic changes.  Hospital Course     Consultants: [facility]  1. NSTEMI - Hs-Troponin peaked at 441. Treated with heparin. Echo was technically difficult but normal LV function. Cath showed complex disease as summarized above.  Dr. Stanford Breed has reviewed the patient's cardiac catheterization films with Dr. Angelena Form and discussed with Dr. Claiborne Billings.  He has an ostial left circumflex lesion that would be difficult to approach percutaneously.  Stent would likely extend into the left main and also potentially compromise the LAD.  They feel that percutaneous intervention is not an option. He was seen by [facility] and refused CABG. Patient understands the risk of medical therapy including myocardial infarction and death. - Continue medical therapy with ASA, Plavix, BB and imdur (new).   2. HLD - 07/21/2019: Cholesterol 288; HDL 76; LDL Cholesterol 196; Triglycerides 79; VLDL 16 - Intolerant to statins with prior liver function and CK elevation.   -Consider Repatha as an outpatient. - LDL goal less than 70  3. Tobacco abuse - Encouraged cessation  4. Carotid artery disease - 40-59% left carotid stenosis - Continue ASA. PCSK9 inhibitor as outpatient.   5. AKI - renal function normal.   5. HTN - BP relatively stable. Continue Toprol and ramipril  (he was not taking Losartan).   Did the patient have an acute coronary syndrome (MI, NSTEMI, STEMI, etc) this admission?:  Yes                               AHA/ACC Clinical Performance & Quality Measures: 1. Aspirin prescribed? - Yes 2. ADP Receptor Inhibitor (Plavix/Clopidogrel, Brilinta/Ticagrelor or Effient/Prasugrel) prescribed (includes medically managed patients)?  - Yes 3. Beta Blocker prescribed? - Yes 4. High Intensity Statin (Lipitor 40-80mg  or Crestor 20-40mg ) prescribed? - No - statin intolerance  5. EF assessed during THIS hospitalization? - Yes 6. For EF <40%, was ACEI/ARB prescribed? - Yes 7. For EF <40%, Aldosterone Antagonist (Spironolactone or Eplerenone) prescribed? - Not Applicable (EF >/= [id number]) 8. Cardiac Rehab Phase II ordered (Included Medically managed Patients)? - No - no stent   _____________  Discharge Vitals Blood pressure (!) 156/73, pulse (!) 47, temperature 97.9 F (36.6 C), temperature source Oral, resp. rate 20, height 5\' 8"  (1.727 m), weight 96.4 kg, SpO2 97 %.  Filed Weights   07/22/19 0600 07/24/19 0554 07/25/19 0537  Weight: 94.7 kg 96 kg 96.4 kg    Labs & Radiologic Studies    CBC Recent Labs    07/24/19 0250 07/25/19 0341  WBC 8.3 8.6  HGB 14.0 14.0  HCT 42.0 41.8  MCV 99.5 97.9  PLT 185 [id number]   Basic Metabolic Panel Recent Labs    07/23/19 0320 07/25/19 0341  NA 136 139  K 4.2 3.9  CL 100 104  CO2 27 25  GLUCOSE 93 92  BUN 20 13  CREATININE 1.13 1.19  CALCIUM 8.4* 8.8*   High Sensitivity Troponin:   Recent Labs  Lab 07/20/19 1134 07/20/19 1441 07/21/19 0338  TROPONINIHS 84* 441* 329*   _____________  Dg Chest 2 View  Result Date: 07/20/2019 CLINICAL DATA:  Pt reports central CP x 6 months EXAM: CHEST - 2 VIEW COMPARISON:  Chest radiograph 04/16/2018 FINDINGS: There is some narrowing in the mid trachea which may be positional. Normal heart size. The lungs are clear. No pneumothorax or pleural effusion. No acute finding in the visualized skeleton. IMPRESSION: No active cardiopulmonary disease. There is some narrowing in the mid trachea which may be positional. Electronically Signed   By: Audie Pinto M.D.   On: 07/20/2019 12:47   Vas US Doppler Pre Cabg  Result Date: 07/23/2019 PREOPERATIVE VASCULAR EVALUATION  Indications:      Pre CABG. Risk Factors:     Hypertension,  hyperlipidemia, current smoker. Comparison Study: no prior Performing Technologist: June Leap RDMS, RVT  Examination Guidelines: A complete evaluation includes B-mode imaging, spectral Doppler, color Doppler, and power Doppler as needed of all accessible portions of each vessel. Bilateral testing is considered an integral part of a complete examination. Limited examinations for reoccurring indications may be performed as noted.  Right Carotid Findings: +----------+--------+--------+--------+------------+---------------------------+             PSV cm/s EDV cm/s Stenosis Describe     Comments                     +----------+--------+--------+--------+------------+---------------------------+  CCA Prox   78       15                                                          +----------+--------+--------+--------+------------+---------------------------+  CCA Distal 62       9                                                           +----------+--------+--------+--------+------------+---------------------------+  ICA Prox   83       13       1-39%    heterogenous technically limited due to                                                       depth of vessels/ high                                                           bifurcation                  +----------+--------+--------+--------+------------+---------------------------+  ICA Distal 78       18                                                          +----------+--------+--------+--------+------------+---------------------------+  ECA        113      12                                                          +----------+--------+--------+--------+------------+---------------------------+ Portions of this table do not appear on this page. +----------+--------+-------+----------------+------------+             PSV cm/s EDV cms Describe         Arm Pressure  +----------+--------+-------+----------------+------------+  Subclavian 128              Multiphasic,  WNL 126           +----------+--------+-------+----------------+------------+ +---------+--------+--+--------+--+---------+  Vertebral PSV cm/s 60 EDV cm/s 21 Antegrade  +---------+--------+--+--------+--+---------+ Left Carotid Findings: +----------+--------+--------+--------+--------------------+-------------------+             PSV cm/s EDV cm/s Stenosis Describe             Comments             +----------+--------+--------+--------+--------------------+-------------------+  CCA Prox   82       17                                                          +----------+--------+--------+--------+--------------------+-------------------+  CCA Distal 74       14                                                          +----------+--------+--------+--------+--------------------+-------------------+  ICA Prox   131      40       40-59%   heterogenous and     technically limited                                         irregular            due to depth of                                                                  vessels/ high                                                                    bifurcation          +----------+--------+--------+--------+--------------------+-------------------+  ICA Distal 92       25                                                          +----------+--------+--------+--------+--------------------+-------------------+  ECA        92       14                                                          +----------+--------+--------+--------+--------------------+-------------------+ +----------+--------+--------+----------------+------------+  Subclavian PSV cm/s EDV cm/s Describe         Arm Pressure  +----------+--------+--------+----------------+------------+             204               Multiphasic, WNL 107           +----------+--------+--------+----------------+------------+ +---------+--------+--+--------+--+---------+  Vertebral PSV cm/s 71 EDV cm/s 26 Antegrade   +---------+--------+--+--------+--+---------+  Right Doppler Findings: +--------+--------+-----+---------+--------+  Site     Pressure Index Doppler   Comments  +--------+--------+-----+---------+--------+  Brachial 126            triphasic           +--------+--------+-----+---------+--------+  Radial                  triphasic           +--------+--------+-----+---------+--------+  Ulnar                   biphasic            +--------+--------+-----+---------+--------+  Left Doppler Findings: +--------+--------+-----+--------+--------+  Site     Pressure Index Doppler  Comments  +--------+--------+-----+--------+--------+  Brachial 107            biphasic           +--------+--------+-----+--------+--------+  Radial                  biphasic           +--------+--------+-----+--------+--------+  Ulnar                   biphasic           +--------+--------+-----+--------+--------+  Summary: Right Carotid: Velocities in the right ICA are consistent with a 1-39% stenosis. Left Carotid: Velocities in the left ICA are consistent with a 40-59% stenosis. ABI: Pedal artery waveforms within normal limits. Right Upper Extremity: Doppler waveform obliterate with right radial compression. Doppler waveforms remain within normal limits with right ulnar compression. Left Upper Extremity: Doppler waveform obliterate with left radial compression. Doppler waveforms remain within normal limits with left ulnar compression.  Electronically signed by Harold Barban MD on 07/23/2019 at 5:52:03 PM.    Final    Disposition   Pt is being discharged home today in good condition.  Follow-up Plans & Appointments    Follow-up Information    Abigail Butts., PA-C. Go on 08/02/2019.   Specialty: Physician Assistant Why: @9 :00am for hospital follow up with Dr. Evette Georges, [place].  Please arrive 15 minutes early Contact information: [place] [address] [phone]          Discharge Instructions     Diet - low sodium heart healthy   Complete by: As directed    Discharge instructions   Complete by: As directed    NO HEAVY LIFTING (>10lbs) X 2 WEEKS. NO SEXUAL ACTIVITY X 2 WEEKS. NO DRIVING X 1 WEEK. NO SOAKING BATHS, HOT TUBS, POOLS, ETC., X 7 DAYS.   Increase activity slowly   Complete by:  As directed       Discharge Medications   Allergies as of 07/25/2019      Reactions   Niaspan [niacin Er] Itching, Other (See Comments)   insomnia   Statins    LFT elevavation, CK elevevation      Medication List    STOP taking these medications   valsartan 160 MG tablet Commonly known as: Diovan     TAKE these medications   albuterol 108 (90 Base) MCG/ACT inhaler Commonly known as: VENTOLIN HFA Inhale 2 puffs into the lungs every 4 (four) hours as needed for wheezing or shortness of breath (cough, shortness of breath or wheezing.). What changed: reasons to take this   aspirin EC 81 MG tablet Take 81 mg by mouth every morning.   budesonide-formoterol 160-4.5 MCG/ACT inhaler Commonly known as: Symbicort Inhale 2 puffs into the lungs 2 (two) times daily. What changed: Another medication with the same name was changed. Make sure you understand how and when to take each.   budesonide-formoterol 160-4.5 MCG/ACT inhaler Commonly known as: Symbicort Inhale 2 puffs into the lungs 2 (two) times daily. What changed:   when to take this  reasons to take this   clopidogrel 75 MG tablet Commonly known as: PLAVIX TAKE 1 TABLET BY MOUTH  DAILY What changed: when to take this   Fish Oil 1000 MG Caps Take 2 capsules (2,000 mg total) by mouth daily.   isosorbide mononitrate 30 MG 24 hr tablet Commonly known as: IMDUR Take 1 tablet (30 mg total) by mouth daily.   metoprolol succinate 25 MG 24 hr tablet Commonly known as: TOPROL-XL TAKE 1 TABLET BY MOUTH  DAILY What changed: when to take this   nitroGLYCERIN 0.4 MG SL tablet Commonly known as: Nitrostat Place 1 tablet (0.4  mg total) under the tongue every 5 (five) minutes as needed for chest pain.   ramipril 10 MG capsule Commonly known as: ALTACE TAKE 1 CAPSULE BY MOUTH  DAILY What changed: when to take this        Outstanding Labs/Studies   None  Duration of Discharge Encounter   Greater than 30 minutes including physician time.  Signed, Leanor Kail, PA 07/25/2019, 8:30 AM

## 2019-07-25 NOTE — Care Management (Signed)
0959 07-25-19 Patient refused any resources for ETOH abuse. Patient is planning for d/c today. No further needs from CM at this time. Bethena Roys, RN,BSN [phone]

## 2019-07-25 NOTE — Progress Notes (Signed)
ANTICOAGULATION CONSULT NOTE - Follow Up Consult  Pharmacy Consult for Heparin Indication: severe three vessel CAD   Patient Measurements: Height: 5\' 8"  (172.7 cm) Weight: 212 lb 8 oz (96.4 kg) IBW/kg (Calculated) : 68.4 Heparin Dosing Weight: 88.4 kg  Vital Signs: Temp: 97.9 F (36.6 C) (11/12 0537) Temp Source: Oral (11/12 0537) BP: 156/73 (11/12 0537) Pulse Rate: 47 (11/12 0537)  Labs: Recent Labs    07/23/19 0320 07/23/19 1259 07/24/19 0250 07/25/19 0341  HGB 13.6  --  14.0 14.0  HCT 40.4  --  42.0 41.8  PLT 187  --  185 197  HEPARINUNFRC 0.23* 0.41 0.47 0.61  CREATININE 1.13  --   --  1.19     Medical History: Past Medical History:  Diagnosis Date  . Arthritis    hands   . Chronic bronchitis ([place])   . Coronary artery disease   . Hyperlipidemia    has not tolerated statins,  would not pursue PSK9 inhibitor as advised by Dr Claiborne Billings  . Hypertension   . Myocardial infarction ([place])   . Tobacco abuse      Assessment: 59 male admitted on 07/20/2019 with chest pain and SOB. Pharmacy consulted to dose heparin for CP and now s/p cath with 3VCAD (patient does not want CABG). No plans for PCI and noted to discontinue heparin today -heparin level at goal  Goal of Therapy:  Heparin level 0.3-0.7 units/ml Monitor platelets by anticoagulation protocol: Yes   Plan:  -No heparin changes needed -Anticipate discontinuation today  Hildred Laser, PharmD Clinical Pharmacist **Pharmacist phone directory can now be found on [email or web address] ([email or web address]).  Listed under [place].

## 2019-07-26 ENCOUNTER — Telehealth: Payer: Self-pay | Admitting: Cardiovascular Disease

## 2019-07-26 SURGERY — CORONARY ARTERY BYPASS GRAFTING (CABG)
Anesthesia: General | Site: Chest

## 2019-07-26 NOTE — Telephone Encounter (Signed)
Patient called stating when he was discharged from the hospital he was given nitroglycerin ointment however no instructions were given on how to use it.  He would like a call back from the nurse

## 2019-07-26 NOTE — Telephone Encounter (Signed)
Spoke to pt who report he was discharged with nitroglycerin ointment but was never provided instruction. Informed pt that per chart review, his discharge instructions does not reflect nitro ointment and only PRN tablets.  Nurse contacted [place] AD who confirmed order for only Nitro tabs. Nurse spoke with pt and advised to discard ointment. Pt verbalized understanding.

## 2019-07-29 NOTE — Telephone Encounter (Signed)
Patient contacted regarding discharge from [facility] on 07/27/2019.  Patient understands to follow up with provider Roby Lofts, PA on 08/02/2019 at 9:00 AM at [facility] office. Patient understands discharge instructions? yes Patient understands medications and regiment? yes Patient understands to bring all medications to this visit? yes

## 2019-07-30 NOTE — Progress Notes (Signed)
Cardiology Office Note   Date:  08/02/2019   ID:  Jonathan [name], DOB Jan 17, 1951, MRN [id number]  PCP:  Leonard Downing, MD  Cardiologist:  Shelva Majestic, MD EP: None  Chief Complaint  Patient presents with  . Hospitalization Follow-up    CAD      History of Present Illness: Jonathan [name] is a 68 y.o. male with PMH of CAD s/p PCI, HTN, HLD, carotid artery disease, and tobacco abuse, who presents for post-hospital follow-up.  He was last evaluated by cardiology during an admission to [facility] from 07/20/2019-07/25/2019 for an NSTEMI. He underwent LHC at that time which showed significant multivessel CADwith 90% diffuse proximal 1st diagonal branch of the LAD, 70% LAD stenosis, 95% ostial LCx stenosis with immediate bifurcation into the AV groove, and mild-moderate ISR of pRCA with 70% stenosis beyond the stent. He was recommended for CABG, however patient refused CABG after discussion with TCTS. He was started on imdur prior to discharge.  He returns today for post-hospital follow-up. He reports improvement in his chest tightness since starting imdur, though still experiences chest discomfort when exerting himself for >5 minutes. He has chronic DOE which is unchanged in recent months. He reports some LE edema which is improved upon waking in the morning. He is compliant with a low sodium diet. We discussed his reasoning for refusing CABG and he is not interested in the potential complications following surgery and does not want to end up in a nursing home. He is aware of the risks of not undergoing surgery. No complaints of palpitations, dizziness, lightheadedness, or syncope.   Past Medical History:  Diagnosis Date  . Arthritis    hands   . Chronic bronchitis ([place])   . Coronary artery disease   . Hyperlipidemia    has not tolerated statins,  would not pursue PSK9 inhibitor as advised by Dr Claiborne Billings  . Hypertension   . Myocardial infarction ([place])   . Tobacco abuse      Past Surgical History:  Procedure Laterality Date  . CARDIAC CATHETERIZATION    . CARDIOVASCULAR STRESS TEST  11/11/2011  . CORONARY ANGIOPLASTY     stents- 01/2004   . heart stents  01/2004  . INGUINAL HERNIA REPAIR Right 12/06/2012   Procedure: HERNIA REPAIR INGUINAL ADULT;  Surgeon: [name], DO;  Location: [facility];  Service: General;  Laterality: Right;  . INSERTION OF MESH Right 12/06/2012   Procedure: INSERTION OF MESH;  Surgeon: [name], DO;  Location: [facility];  Service: General;  Laterality: Right;  . LEFT HEART CATH AND CORONARY ANGIOGRAPHY N/A 07/22/2019   Procedure: LEFT HEART CATH AND CORONARY ANGIOGRAPHY;  Surgeon: [name], MD;  Location: [place] [facility];  Service: Cardiovascular;  Laterality: N/A;     Current Outpatient Medications  Medication Sig Dispense Refill  . albuterol (PROVENTIL HFA;VENTOLIN HFA) 108 (90 Base) MCG/ACT inhaler Inhale 2 puffs into the lungs every 4 (four) hours as needed for wheezing or shortness of breath (cough, shortness of breath or wheezing.). (Patient taking differently: Inhale 2 puffs into the lungs every 4 (four) hours as needed for wheezing or shortness of breath (cough). ) 1 Inhaler 5  . aspirin EC 81 MG tablet Take 81 mg by mouth every morning.    . budesonide-formoterol (SYMBICORT) 160-4.5 MCG/ACT inhaler Inhale 2 puffs into the lungs 2 (two) times daily. (Patient taking differently: Inhale 2 puffs into the lungs 2 (two) times daily as needed (shortness of breath/wheezing). )  1 Inhaler 11  . clopidogrel (PLAVIX) 75 MG tablet TAKE 1 TABLET BY MOUTH  DAILY (Patient taking differently: Take 75 mg by mouth every morning. ) 90 tablet 3  . isosorbide mononitrate (IMDUR) 30 MG 24 hr tablet Take 1 tablet (30 mg total) by mouth daily. 30 tablet 6  . metoprolol succinate (TOPROL-XL) 25 MG 24 hr tablet TAKE 1 TABLET BY MOUTH  DAILY (Patient taking differently: Take 25 mg by mouth every morning. ) 90 tablet 3  . nitroGLYCERIN (NITROSTAT) 0.4 MG  SL tablet Place 1 tablet (0.4 mg total) under the tongue every 5 (five) minutes as needed for chest pain. 25 tablet 12  . Omega-3 Fatty Acids (FISH OIL) 1000 MG CAPS Take 2 capsules (2,000 mg total) by mouth daily. 90 capsule 0  . ramipril (ALTACE) 10 MG capsule TAKE 1 CAPSULE BY MOUTH  DAILY (Patient taking differently: Take 10 mg by mouth every morning. ) 90 capsule 3   No current facility-administered medications for this visit.     Allergies:   Niaspan [niacin er] and Statins    Social History:  The patient  reports that he has been smoking cigarettes. He has a 53.00 pack-year smoking history. He has never used smokeless tobacco. He reports current alcohol use of about 5.0 standard drinks of alcohol per week. He reports that he does not use drugs.   Family History:  The patient's family history includes CAD in his father.    ROS:  Please see the history of present illness.   Otherwise, review of systems are positive for none.   All other systems are reviewed and negative.    PHYSICAL EXAM: VS:  BP (!) 118/58 (BP Location: Left Arm, Patient Position: Sitting, Cuff Size: Normal)   Pulse 78   Temp (!) 97.2 F (36.2 C)   Resp 13   Ht 5\' 8"  (1.727 m)   Wt 210 lb (95.3 kg)   BMI 31.93 kg/m  , BMI Body mass index is 31.93 kg/m. GEN: Well nourished, well developed, in no acute distress HEENT: sclera anicteric Neck: no JVD, carotid bruits, or masses Cardiac: RRR; no murmurs, rubs, or gallops, trace edema  Respiratory:  clear to auscultation bilaterally, normal work of breathing GI: soft, nontender, nondistended, + BS MS: no deformity or atrophy Skin: warm and dry, no rash Neuro:  Strength and sensation are intact Psych: euthymic mood, full affect   EKG:  EKG is not ordered today.   Recent Labs: 07/20/2019: ALT 18; Magnesium 2.1 07/25/2019: BUN 13; Creatinine, Ser 1.19; Hemoglobin 14.0; Platelets 197; Potassium 3.9; Sodium 139    Lipid Panel    Component Value Date/Time    CHOL 288 (H) 07/21/2019 0338   TRIG 79 07/21/2019 0338   HDL 76 07/21/2019 0338   CHOLHDL 3.8 07/21/2019 0338   VLDL 16 07/21/2019 0338   LDLCALC 196 (H) 07/21/2019 0338      Wt Readings from Last 3 Encounters:  08/02/19 210 lb (95.3 kg)  07/25/19 212 lb 8 oz (96.4 kg)  06/08/18 215 lb (97.5 kg)      Other studies Reviewed: Additional studies/ records that were reviewed today include:  Echo 07/21/2019 1. Extremely limited images despite use of Definity. 2. Definity contrast agent was given IV to delineate the left ventricular endocardial borders. 3. Left ventricular ejection fraction, by visual estimation, is approximately 55 to 60%. The left ventricle has normal function. There is no left ventricular hypertrophy. Cannot comment of focal wall motion. 4. Global  right ventricle was not well visualized.The right ventricular size is not well visualized. Right vetricular wall thickness was not assessed. 5. Left atrial size was not well visualized. 6. Right atrial size was not well visualized. 7. Presence of pericardial fat pad. 8. The mitral valve was not well visualized. No evidence of mitral valve regurgitation. 9. The tricuspid valve is not well visualized. Tricuspid valve regurgitation not well evaluated. 10. The aortic valve was not well visualized. Aortic valve regurgitation is not visualized. 11. The pulmonic valve was not well visualized. Pulmonic valve regurgitation is not visualized. 12. The aortic root was not well visualized. 13. The inferior vena cava appears dilated, but difficult to estimate CVP. 14. The interatrial septum was not well visualized.  LEFT HEART CATH AND CORONARY ANGIOGRAPHY 07/22/2019  Conclusion    Prox RCA to Mid RCA lesion is 80% stenosed.  Prox RCA lesion is 40% stenosed with 30% stenosed side branch in RV Branch.  Mid RCA to Dist RCA lesion is 70% stenosed.  Ost Cx to Prox Cx lesion is 95% stenosed.  1st Diag lesion is 90%  stenosed.  Mid LAD lesion is 70% stenosed.  Significant multivessel CAD with 90% diffuse proximal stenosis in the first diagonal branch of the LAD with 70% LAD stenosis beyond the diagonal takeoff; 95% ostial left circumflex stenosis with the vessel immediately bifurcating into the AV groove and circumflex; and mild to moderate in-stent narrowing of approximately 40% in the previously placed proximal RCA stent with focal 80% mid distal in-stent restenosis and new 70% stenosis beyond the stented segment proximal to the acute margin and a large dominant RCA.  Low normal global LV contractility with EF estimate at 50 to 55% with mild residual inferior hypocontractility sustained in 2005. LVEDP 15 mm  RECOMMENDATION: I had a long discussion with the patient prior to the catheterization and he agreed to rescind his DNR for the procedure in the event countershock or CPR was necessary. After review of the angiographic findings in detail with the patient as well as the description of good target vessels the patient now seems amenable to consider possible CABG revascularization surgery. Will obtain surgical consultation. Plavix will be held; consider P212 testing in several days if the plan is to move forward with CABG revascularization surgery. Will initiate heparin therapy 8 hours post TR band.   Diagnostic Dominance: Right       ASSESSMENT AND PLAN:  1. Severe multivessel CAD: refusing CABG. Still with some chest tightness that occurs within 5 minutes of exerting himself - Continue aspirin and plavix - Continue metoprolol succinate - Will increase imdur to 60mg  daily  2. HTN: BP 118/58 today - Continue metoprolol succinate, ramipril, and imdur  3. HLD: cholesterol poorly controlled with LDL 196 07/21/2019. Intolerant to statins - Will refer to the lipid clinic for PSK-9 inhibitor.    4. Carotid artery disease: mild right ICA stenosis and moderate left ICA stenosis on dopplers  07/2019 - Continue aspirin and statin  5. Tobacco abuse: smoking 1 ppd. Not interested in quitting - Continue to encourage cessation   Current medicines are reviewed at length with the patient today.  The patient does not have concerns regarding medicines.  The following changes have been made:  As above  Labs/ tests ordered today include:  No orders of the defined types were placed in this encounter.    Disposition:   FU with Dr. Claiborne Billings in 3 months  Signed, Abigail Butts, PA-C  08/02/2019 9:33 AM

## 2019-08-02 ENCOUNTER — Other Ambulatory Visit: Payer: Self-pay

## 2019-08-02 ENCOUNTER — Ambulatory Visit: Payer: Medicare Other | Admitting: Medical

## 2019-08-02 ENCOUNTER — Encounter: Payer: Self-pay | Admitting: Medical

## 2019-08-02 VITALS — BP 118/58 | HR 78 | Temp 97.2°F | Resp 13 | Ht 68.0 in | Wt 210.0 lb

## 2019-08-02 DIAGNOSIS — E785 Hyperlipidemia, unspecified: Secondary | ICD-10-CM

## 2019-08-02 DIAGNOSIS — I6523 Occlusion and stenosis of bilateral carotid arteries: Secondary | ICD-10-CM

## 2019-08-02 DIAGNOSIS — Z720 Tobacco use: Secondary | ICD-10-CM

## 2019-08-02 DIAGNOSIS — I25118 Atherosclerotic heart disease of native coronary artery with other forms of angina pectoris: Secondary | ICD-10-CM

## 2019-08-02 DIAGNOSIS — I10 Essential (primary) hypertension: Secondary | ICD-10-CM | POA: Diagnosis not present

## 2019-08-02 DIAGNOSIS — Z789 Other specified health status: Secondary | ICD-10-CM | POA: Diagnosis not present

## 2019-08-02 MED ORDER — ISOSORBIDE MONONITRATE ER 60 MG PO TB24: 60.0000 mg | ORAL_TABLET | Freq: Every day | ORAL | 1 refills | Status: DC

## 2019-08-02 MED ORDER — ISOSORBIDE MONONITRATE ER 60 MG PO TB24: 60.0000 mg | ORAL_TABLET | Freq: Every day | ORAL | 0 refills | Status: DC

## 2019-08-02 NOTE — Patient Instructions (Signed)
Medication Instructions:   INCREASE Imdur to 60 mg daily  *If you need a refill on your cardiac medications before your next appointment, please call your pharmacy*  Lab Work:  NONE ordered at this time of appointment   If you have labs (blood work) drawn today and your tests are completely normal, you will receive your results only by: [name] MyChart Message (if you have MyChart) OR . A paper copy in the mail If you have any lab test that is abnormal or we need to change your treatment, we will call you to review the results.  Testing/Procedures:  NONE ordered at this time of appointment   Follow-Up: At [facility], you and your health needs are our priority.  As part of our continuing mission to provide you with exceptional heart care, we have created designated Provider Care Teams.  These Care Teams include your primary Cardiologist (physician) and Advanced Practice Providers (APPs -  Physician Assistants and Nurse Practitioners) who all work together to provide you with the care you need, when you need it.  Your next appointment:   3 month(s)  The format for your next appointment:   In Person  Provider:   Shelva Majestic, MD  Other Instructions

## 2019-09-09 ENCOUNTER — Encounter: Payer: Self-pay | Admitting: General Practice

## 2019-10-01 ENCOUNTER — Other Ambulatory Visit: Payer: Self-pay

## 2019-10-01 ENCOUNTER — Ambulatory Visit: Payer: Medicare Other | Admitting: Podiatry

## 2019-10-01 DIAGNOSIS — L84 Corns and callosities: Secondary | ICD-10-CM | POA: Diagnosis not present

## 2019-10-01 DIAGNOSIS — Z7901 Long term (current) use of anticoagulants: Secondary | ICD-10-CM | POA: Diagnosis not present

## 2019-10-01 DIAGNOSIS — B351 Tinea unguium: Secondary | ICD-10-CM

## 2019-10-01 NOTE — Progress Notes (Signed)
Subjective:   Patient ID: Jonathan [name], male   DOB: 69 y.o.   MRN: [id number]   HPI 69 year old male presents the office with concerns of thick, brittle toenails with the right 3rd toenail the worse. He states that the toenails have become thick where his wife cannot trim them. He is on Plavix. He also gets painful calluses they tries to use a motorized sander at times to trim them.  Review of Systems  All other systems reviewed and are negative.  Past Medical History:  Diagnosis Date  . Arthritis    hands   . Chronic bronchitis ([place])   . Coronary artery disease   . Hyperlipidemia    has not tolerated statins,  would not pursue PSK9 inhibitor as advised by Dr Claiborne Billings  . Hypertension   . Myocardial infarction ([place])   . Tobacco abuse     Past Surgical History:  Procedure Laterality Date  . CARDIAC CATHETERIZATION    . CARDIOVASCULAR STRESS TEST  11/11/2011  . CORONARY ANGIOPLASTY     stents- 01/2004   . heart stents  01/2004  . INGUINAL HERNIA REPAIR Right 12/06/2012   Procedure: HERNIA REPAIR INGUINAL ADULT;  Surgeon: [name], DO;  Location: [facility];  Service: General;  Laterality: Right;  . INSERTION OF MESH Right 12/06/2012   Procedure: INSERTION OF MESH;  Surgeon: [name], DO;  Location: [facility];  Service: General;  Laterality: Right;  . LEFT HEART CATH AND CORONARY ANGIOGRAPHY N/A 07/22/2019   Procedure: LEFT HEART CATH AND CORONARY ANGIOGRAPHY;  Surgeon: [name], MD;  Location: [place] [facility];  Service: Cardiovascular;  Laterality: N/A;     Current Outpatient Medications:  .  albuterol (PROVENTIL HFA;VENTOLIN HFA) 108 (90 Base) MCG/ACT inhaler, Inhale 2 puffs into the lungs every 4 (four) hours as needed for wheezing or shortness of breath (cough, shortness of breath or wheezing.). (Patient taking differently: Inhale 2 puffs into the lungs every 4 (four) hours as needed for wheezing or shortness of breath (cough). ), Disp: 1 Inhaler, Rfl: 5 .  aspirin EC 81 MG  tablet, Take 81 mg by mouth every morning., Disp: , Rfl:  .  budesonide-formoterol (SYMBICORT) 160-4.5 MCG/ACT inhaler, Inhale 2 puffs into the lungs 2 (two) times daily. (Patient taking differently: Inhale 2 puffs into the lungs 2 (two) times daily as needed (shortness of breath/wheezing). ), Disp: 1 Inhaler, Rfl: 11 .  clopidogrel (PLAVIX) 75 MG tablet, TAKE 1 TABLET BY MOUTH  DAILY (Patient taking differently: Take 75 mg by mouth every morning. ), Disp: 90 tablet, Rfl: 3 .  isosorbide mononitrate (IMDUR) 60 MG 24 hr tablet, Take 1 tablet (60 mg total) by mouth daily., Disp: 30 tablet, Rfl: 0 .  metoprolol succinate (TOPROL-XL) 25 MG 24 hr tablet, TAKE 1 TABLET BY MOUTH  DAILY (Patient taking differently: Take 25 mg by mouth every morning. ), Disp: 90 tablet, Rfl: 3 .  nitroGLYCERIN (NITROSTAT) 0.4 MG SL tablet, Place 1 tablet (0.4 mg total) under the tongue every 5 (five) minutes as needed for chest pain., Disp: 25 tablet, Rfl: 12 .  Omega-3 Fatty Acids (FISH OIL) 1000 MG CAPS, Take 2 capsules (2,000 mg total) by mouth daily., Disp: 90 capsule, Rfl: 0 .  ramipril (ALTACE) 10 MG capsule, TAKE 1 CAPSULE BY MOUTH  DAILY (Patient taking differently: Take 10 mg by mouth every morning. ), Disp: 90 capsule, Rfl: 3  Allergies  Allergen Reactions  . Niaspan [Niacin Er] Itching and Other (See  Comments)    insomnia  . Statins     LFT elevavation, CK elevevation         Objective:  Physical Exam  General: AAO x3, NAD  Dermatological: Nails are hypertrophic, dystrophic, brittle, discolored, elongated 10. No surrounding redness or drainage. Tenderness nails 1-5 bilaterally. Hyperkeratotic lesions bilateral submetatarsal 5. No underlying ulceration drainage or any signs of infection. No open lesions or pre-ulcerative lesions are identified today.  Vascular: Dorsalis Pedis artery and Posterior Tibial artery pedal pulses are 2/4 bilateral with immedate capillary fill time.  There is no pain with calf  compression, swelling, warmth, erythema.   Neruologic: Grossly intact via light touch bilateral. Protective threshold with Semmes Wienstein monofilament intact to all pedal sites bilateral.   Musculoskeletal: No gross boney pedal deformities bilateral. No pain, crepitus, or limitation noted with foot and ankle range of motion bilateral. Muscular strength 5/5 in all groups tested bilateral.  Gait: Unassisted, Nonantalgic.      Assessment:   Symptomatic onychomycosis, callus formation on Plavix    Plan:  -Treatment options discussed including all alternatives, risks, and complications -Etiology of symptoms were discussed -Nails debrided 10 without complications or bleeding. -Debrided the hyperkeratotic lesions x2 without any complications or bleeding -Daily foot inspection -Follow-up in 3 months or sooner if any problems arise. In the meantime, encouraged to call the office with any questions, concerns, change in symptoms.   Celesta Gentile, DPM

## 2019-10-17 ENCOUNTER — Ambulatory Visit (INDEPENDENT_AMBULATORY_CARE_PROVIDER_SITE_OTHER): Payer: Medicare Other | Admitting: Pharmacist Clinician (PhC)/ Clinical Pharmacy Specialist

## 2019-10-17 ENCOUNTER — Other Ambulatory Visit: Payer: Self-pay

## 2019-10-17 DIAGNOSIS — E7801 Familial hypercholesterolemia: Secondary | ICD-10-CM | POA: Diagnosis not present

## 2019-10-17 NOTE — Assessment & Plan Note (Signed)
Patient with familial hyperlipidemia, LDL at 196.  Reviewed options of Repatha and Nexletol.  Discussed mechanisms of action, dosing, side effects and potential drop in LDL.  Patient willing to try Repatha Sureclick.  Will arrange PA with his insurance company then send to local pharmacy.  Patient declines need for assistance with copays.  We have asked that he repeat lipid labs after 6 doses to be sure effective.

## 2019-10-17 NOTE — Progress Notes (Signed)
10/17/2019 BARNELL MANE [birth year] [id number]   HPI:  Jonathan [name] is a 69 y.o. male patient of Dr Claiborne Billings, who presents today for a lipid clinic evaluation.  See pertinent past medical history below.   In November 2020 patient was hospitalized for NSTEMI.  Heart cath showed multiple blockages and he was referred to surgeons for CABG.  After discussion patient refused CABG and was discharged on medical therapy.  He still notes some chest discomfort at times, especially with exertion > 5 minutes, as well as chronic DOE.  He continues to smoke and is not interested in smoking cessation today.   Today he comes in for evaluation of hyperlipidemia and to discuss treatment options. He is not willing to try any further statin drugs, and has also had problems with other cholesterol medications.  He is hesitant about trying anything new.    Past Medical History: CAD S/p NSTEMI 07/2019, pt refused CABG, on imdur  hypertension Controlled, on ramipril 10, metoprolol succ 25   COPD symbicort daily, albuterol MDI prn    Current Medications: none (OTC fish oil)  Cholesterol Goals: LDL < 50% or < 70   Intolerant/previously tried: ezetimibe, Welchol, fenofibrate; lipitor,  Family history: father died due to complications from smoking; mother still living at 101 with dementia; 1 brother with heart disease other doing well; children no issues in their 37s'   Diet: mostly home cooked meals, has air fryer;  Mix of veggies; snacks mostly pretzles  Exercise:  No regular exercise; works in yard during summer  Labs:  07/2019: TC 288, TG 79, HDL 76, LDL 196   Current Outpatient Medications  Medication Sig Dispense Refill  . albuterol (PROVENTIL HFA;VENTOLIN HFA) 108 (90 Base) MCG/ACT inhaler Inhale 2 puffs into the lungs every 4 (four) hours as needed for wheezing or shortness of breath (cough, shortness of breath or wheezing.). (Patient taking differently: Inhale 2 puffs into the lungs every 4 (four) hours as  needed for wheezing or shortness of breath (cough). ) 1 Inhaler 5  . aspirin EC 81 MG tablet Take 81 mg by mouth every morning.    . budesonide-formoterol (SYMBICORT) 160-4.5 MCG/ACT inhaler Inhale 2 puffs into the lungs 2 (two) times daily. (Patient taking differently: Inhale 2 puffs into the lungs 2 (two) times daily as needed (shortness of breath/wheezing). ) 1 Inhaler 11  . clopidogrel (PLAVIX) 75 MG tablet TAKE 1 TABLET BY MOUTH  DAILY (Patient taking differently: Take 75 mg by mouth every morning. ) 90 tablet 3  . isosorbide mononitrate (IMDUR) 60 MG 24 hr tablet Take 1 tablet (60 mg total) by mouth daily. 30 tablet 0  . metoprolol succinate (TOPROL-XL) 25 MG 24 hr tablet TAKE 1 TABLET BY MOUTH  DAILY (Patient taking differently: Take 25 mg by mouth every morning. ) 90 tablet 3  . nitroGLYCERIN (NITROSTAT) 0.4 MG SL tablet Place 1 tablet (0.4 mg total) under the tongue every 5 (five) minutes as needed for chest pain. 25 tablet 12  . Omega-3 Fatty Acids (FISH OIL) 1000 MG CAPS Take 2 capsules (2,000 mg total) by mouth daily. 90 capsule 0  . ramipril (ALTACE) 10 MG capsule TAKE 1 CAPSULE BY MOUTH  DAILY (Patient taking differently: Take 10 mg by mouth every morning. ) 90 capsule 3   No current facility-administered medications for this visit.    Allergies  Allergen Reactions  . Niaspan [Niacin Er] Itching and Other (See Comments)    insomnia  . Statins  LFT elevavation, CK elevevation    Past Medical History:  Diagnosis Date  . Arthritis    hands   . Chronic bronchitis ([place])   . Coronary artery disease   . Hyperlipidemia    has not tolerated statins,  would not pursue PSK9 inhibitor as advised by Dr Claiborne Billings  . Hypertension   . Myocardial infarction ([place])   . Tobacco abuse     Blood pressure 126/68, pulse (!) 51, resp. rate 14, height 5\' 8"  (1.727 m), weight 223 lb (101.2 kg), SpO2 95 %.   Hyperlipidemia Patient with familial hyperlipidemia, LDL at 196.  Reviewed options of  Repatha and Nexletol.  Discussed mechanisms of action, dosing, side effects and potential drop in LDL.  Patient willing to try Repatha Sureclick.  Will arrange PA with his insurance company then send to local pharmacy.  Patient declines need for assistance with copays.  We have asked that he repeat lipid labs after 6 doses to be sure effective.     Tommy Medal PharmD CPP [facility] [address] [place], [place] [address] [phone]

## 2019-10-17 NOTE — Patient Instructions (Signed)
Your Results:             Your most recent labs Goal  Total Cholesterol 288 < 200  Triglycerides 79 < 150  HDL (happy/good cholesterol) 76 > 40  LDL (lousy/bad cholesterol 196 < 70      Medication changes:  We will start the paperwork to get Repatha Sureclick covered by your insurance.  After your 6th dose (about 3 months, we will have you come into the office to get lab work done to be sure the medication is working  Patient Assistance:  The [employer] foundation offers assistance to help pay for medication copays.  They will cover copays for all cholesterol lowering meds, including statins, fibrates, omega-3 oils, ezetimibe, Repatha, Praluent, Nexletol, Nexlizet.  The cards are usually good for $2,500 or 12 months, whichever comes first. 1. Go to healthwellfoundation.org 2. Click on "Apply Now" 3. Answer questions as to whom is applying (patient or representative) 4. Your disease fund will be "hypercholesterolemia - Medicare access" 5. They will ask questions about finances and which medications you are taking for cholesterol 6. When you submit, the approval is usually within minutes.  You will need to print the card information from the site 7. You will need to show this information to your pharmacy, they will bill your Medicare Part D plan first -then bill [employer] --for the copay.   You can also call them at [phone], although the hold times can be quite long.   Thank you for choosing [facility]

## 2019-10-21 ENCOUNTER — Telehealth: Payer: Self-pay

## 2019-10-21 MED ORDER — REPATHA SURECLICK 140 MG/ML ~~LOC~~ SOAJ: 140.0000 mg | SUBCUTANEOUS | 11 refills | Status: DC

## 2019-10-21 NOTE — Telephone Encounter (Signed)
CALLED AND LMOMED THE PT STATED THAT THE REPATHA WAS APPROVED THROUGH INSURANCE, SENT RX THRU PHARMACY, AND INSTRUCTED THE PT TO CALLBACK IF THEY HAVE ANY ISSUES

## 2019-11-05 ENCOUNTER — Other Ambulatory Visit: Payer: Self-pay

## 2019-11-05 ENCOUNTER — Encounter: Payer: Self-pay | Admitting: Cardiovascular Disease

## 2019-11-05 ENCOUNTER — Ambulatory Visit: Payer: Medicare Other | Admitting: Cardiovascular Disease

## 2019-11-05 VITALS — BP 134/68 | HR 67 | Temp 97.3°F | Ht 68.0 in | Wt 218.6 lb

## 2019-11-05 DIAGNOSIS — E785 Hyperlipidemia, unspecified: Secondary | ICD-10-CM | POA: Diagnosis not present

## 2019-11-05 DIAGNOSIS — Z789 Other specified health status: Secondary | ICD-10-CM

## 2019-11-05 DIAGNOSIS — I6523 Occlusion and stenosis of bilateral carotid arteries: Secondary | ICD-10-CM | POA: Diagnosis not present

## 2019-11-05 DIAGNOSIS — Z720 Tobacco use: Secondary | ICD-10-CM

## 2019-11-05 DIAGNOSIS — I25118 Atherosclerotic heart disease of native coronary artery with other forms of angina pectoris: Secondary | ICD-10-CM | POA: Diagnosis not present

## 2019-11-05 DIAGNOSIS — I10 Essential (primary) hypertension: Secondary | ICD-10-CM

## 2019-11-05 MED ORDER — METOPROLOL SUCCINATE ER 25 MG PO TB24: 37.5000 mg | ORAL_TABLET | Freq: Every day | ORAL | 3 refills | Status: DC

## 2019-11-05 MED ORDER — AMLODIPINE BESYLATE 5 MG PO TABS: 5.0000 mg | ORAL_TABLET | Freq: Every day | ORAL | 2 refills | Status: DC

## 2019-11-05 NOTE — Patient Instructions (Signed)
Medication Instructions:  BEGIN TAKING AMLODIPINE 5MG -( FOR THE FIRST WEEK TAKE 1/2 TABLET (2.5MG ) AT NIGHT. IF AFTER THE FIRST WEEK YOU CONTINUE TO HAVE CHEST PAIN IN THE MORNING, YOU CAN INCREASE TO 1 WHOLE TABLET (5MG ))  INCREASE METOPROLOL SUCCINATE TO 37.5MG  (1 AND 1/2 TABLET DAILY)  *If you need a refill on your cardiac medications before your next appointment, please call your pharmacy*  Lab Work: FASTING LIPID CMET If you have labs (blood work) drawn today and your tests are completely normal, you will receive your results only by: [name] MyChart Message (if you have MyChart) OR . A paper copy in the mail If you have any lab test that is abnormal or we need to change your treatment, we will call you to review the results.  Follow-Up: At [facility], you and your health needs are our priority.  As part of our continuing mission to provide you with exceptional heart care, we have created designated Provider Care Teams.  These Care Teams include your primary Cardiologist (physician) and Advanced Practice Providers (APPs -  Physician Assistants and Nurse Practitioners) who all work together to provide you with the care you need, when you need it.  Your next appointment:   3 month(s)  The format for your next appointment:   In Person  Provider:   Shelva Majestic, MD

## 2019-11-05 NOTE — Progress Notes (Signed)
Patient ID: Jonathan [name], male   DOB: November 20, 1950, 69 y.o.   MRN: [id number]    Primary M.D.: Dr. Claris Gower  HPI: Jonathan [name] is a 69 y.o. male who presents to the office today for a 3 month cardiology evaluation following his most recent hospitalization and cardiac catheterization.   Jonathan [name] has a history of coronary artery disease and on 01/11/2004 suffered an inferior wall myocardial infarction. Cardiac catheterization revealed total occlusion of the RCA and underwent successful intervention with ultimate insertion of a 3.0x33 mm and 3.0x13 mm drug-eluting Cipher stent with restoration of TIMI-3 flow. Additional problems include hyperlipidemia and ongoing tobacco use.  He admits to smoking at least one pack of cigarettes per day. He has a history of hypertension and has been on ramipril 10 mg in addition to Toprol-XL 25 mg.  He's been maintained on dual antiplatelet therapy with aspirin and Plavix.  He also takes Advair Diskus for his lung disease.  He has had intolerance to statins secondary to increased LFTs as well as CPK elevation. He had been on WelChol, fenofibrate, Zetia but apparently has not taking any lipid lowering therapy presently. His last nuclear perfusion study in March 2013 was unchanged from prior studies and did not show any evidence for scar or ischemia with only minimal residual basilar inferior thinning.  There was significant myocardial salvage from his successful reperfusion in the setting of his MI.Ejection fraction was 57%.  He sees Dr. Claris Gower, for primary care.  Laboratory by Dr. Arelia Sneddon in 07/15/2014 showed normal renal function with a BUN of 11,Cr0.98.  His lipids were markedly abnormal with a total cholesterol of 270, triglycerides of 368, VLDL of 74, and LDL cholesterol of 152.  HDL cholesterol was 44. Hemoglobin C was 5.6.  When I saw him in April 2018, he denied any episodes of chest pain or palpitations.  Unfortunately, he was continuing to smoke and  had started smoking at age 74.  He has COPD.  I last saw him in the office in January 2019.  Unfortunately he continues to smoke.  He has no interest in quitting.  He has been tried on multiple lipid-lowering agents develop increased LFTs and CPK elevation on statins.  In addition, I've tried WelChol, fenofibrate, and Zetia.  I last saw him, I discussed the possibility of Repatha.  He had undergone repeat laboratory in July 2018, which continue to show a total cholesterol 238, triglycerides 308, HDL 47, VLDL 62, and LDL cholesterol at 129.    Jonathan [name] was admitted to [facility] July 30, 2019 with a non-ST segment elevation MI.  At that time, I performed repeat cardiac catheterization after he consented to changes DNR status for the catheterization.  This revealed significant multivessel CAD with 90% diffuse proximal stenosis in the first diagonal branch of the LAD with 70% LAD stenosis beyond the diagonal takeoff.  There was 95% ostial left circumflex stenosis the vessel immediately bifurcating into the AV groove and circumflex.  The RCA had 40% the previously placed proximal stent with focal 80% mid distal in-stent restenosis and new 70% stenosis beyond the stented segment proximal to the acute margin large dominant RCA.  An echo Doppler study revealed EF of 55 to 60% without LVH.  ``Following the catheterization we discussed consideration for possible CABG revascularization surgery and obtain surgical consultation.  Apparently, the patient refused to consider CABG revascularization ultimately was subsequently discharged.  He has since been seen by Roby Lofts, PA-C in the  office on August 02, 2019.  He again stated his desire not to have surgery.  Over the past several months, Jonathan [name] admits to daily episodes of anginal pectoris.  He is not very active.  He also admits to exertional dyspnea.  He is reduced his 1 pack/day tobacco habit down to 1/2 pack/day.  He has been smoking for over 60  years.  During his hospitalization, he had marked hyperlipidemia with total cholesterol 288, LDL cholesterol 196.  He was ultimately approved for Repatha and received his first PCSK9 inhibitor injection on October 28, 2019.  He presents for cardiology follow-up evaluation.`  Past Medical History:  Diagnosis Date   Arthritis    hands    Chronic bronchitis ([place])    Coronary artery disease    Hyperlipidemia    has not tolerated statins,  would not pursue PSK9 inhibitor as advised by Dr Claiborne Billings   Hypertension    Myocardial infarction [facility])    Tobacco abuse     Past Surgical History:  Procedure Laterality Date   CARDIAC CATHETERIZATION     CARDIOVASCULAR STRESS TEST  11/11/2011   CORONARY ANGIOPLASTY     stents- 01/2004    heart stents  01/2004   INGUINAL HERNIA REPAIR Right 12/06/2012   Procedure: HERNIA REPAIR INGUINAL ADULT;  Surgeon: [name], DO;  Location: [facility] ORS;  Service: General;  Laterality: Right;   INSERTION OF MESH Right 12/06/2012   Procedure: INSERTION OF MESH;  Surgeon: [name], DO;  Location: [facility] ORS;  Service: General;  Laterality: Right;   LEFT HEART CATH AND CORONARY ANGIOGRAPHY N/A 07/22/2019   Procedure: LEFT HEART CATH AND CORONARY ANGIOGRAPHY;  Surgeon: [name], MD;  Location: [facility] LAB;  Service: Cardiovascular;  Laterality: N/A;    Allergies  Allergen Reactions   Niaspan [Niacin Er] Itching and Other (See Comments)    insomnia   Statins     LFT elevavation, CK elevevation    Current Outpatient Medications  Medication Sig Dispense Refill   albuterol (PROVENTIL HFA;VENTOLIN HFA) 108 (90 Base) MCG/ACT inhaler Inhale 2 puffs into the lungs every 4 (four) hours as needed for wheezing or shortness of breath (cough, shortness of breath or wheezing.). (Patient taking differently: Inhale 2 puffs into the lungs every 4 (four) hours as needed for wheezing or shortness of breath (cough). ) 1 Inhaler 5   aspirin EC 81 MG tablet Take  81 mg by mouth every morning.     budesonide-formoterol (SYMBICORT) 160-4.5 MCG/ACT inhaler Inhale 2 puffs into the lungs 2 (two) times daily. (Patient taking differently: Inhale 2 puffs into the lungs 2 (two) times daily as needed (shortness of breath/wheezing). ) 1 Inhaler 11   clopidogrel (PLAVIX) 75 MG tablet TAKE 1 TABLET BY MOUTH  DAILY (Patient taking differently: Take 75 mg by mouth every morning. ) 90 tablet 3   Evolocumab (REPATHA SURECLICK) 332 MG/ML SOAJ Inject 140 mg into the skin every 14 (fourteen) days. 2 pen 11   isosorbide mononitrate (IMDUR) 60 MG 24 hr tablet Take 1 tablet (60 mg total) by mouth daily. 30 tablet 0   metoprolol succinate (TOPROL-XL) 25 MG 24 hr tablet Take 1.5 tablets (37.5 mg total) by mouth daily. 90 tablet 3   nitroGLYCERIN (NITROSTAT) 0.4 MG SL tablet Place 1 tablet (0.4 mg total) under the tongue every 5 (five) minutes as needed for chest pain. 25 tablet 12   Omega-3 Fatty Acids (FISH OIL) 1000 MG CAPS Take 2 capsules (2,000 mg  total) by mouth daily. 90 capsule 0   ramipril (ALTACE) 10 MG capsule TAKE 1 CAPSULE BY MOUTH  DAILY (Patient taking differently: Take 10 mg by mouth every morning. ) 90 capsule 3   amLODipine (NORVASC) 5 MG tablet Take 1 tablet (5 mg total) by mouth daily. 30 tablet 2   No current facility-administered medications for this visit.    Social History   Socioeconomic History   Marital status: Married    Spouse name: Not on file   Number of children: Not on file   Years of education: Not on file   Highest education level: Not on file  Occupational History   Not on file  Tobacco Use   Smoking status: Current Every Day Smoker    Packs/day: 1.00    Years: 53.00    Pack years: 53.00    Types: Cigarettes   Smokeless tobacco: Never Used  Substance and Sexual Activity   Alcohol use: Yes    Alcohol/week: 5.0 standard drinks    Types: 5 Standard drinks or equivalent per week    Comment: consumes 1 pint per day      Drug use: No   Sexual activity: Yes  Other Topics Concern   Not on file  Social History Narrative   Retired   Lives in [place] to be DNR   Social Determinants of Radio broadcast assistant Strain:    Difficulty of Paying Living Expenses: Not on file  Food Insecurity:    Worried About Charity fundraiser in the Last Year: Not on file   [employer] of Food in the Last Year: Not on file  Transportation Needs:    Lack of Transportation (Medical): Not on file   Lack of Transportation (Non-Medical): Not on file  Physical Activity:    Days of Exercise per Week: Not on file   Minutes of Exercise per Session: Not on file  Stress:    Feeling of Stress : Not on file  Social Connections:    Frequency of Communication with Friends and Family: Not on file   Frequency of Social Gatherings with Friends and Family: Not on file   Attends Religious Services: Not on file   Active Member of Clubs or Organizations: Not on file   Attends Archivist Meetings: Not on file   Marital Status: Not on file  Intimate Partner Violence:    Fear of Current or Ex-Partner: Not on file   Emotionally Abused: Not on file   Physically Abused: Not on file   Sexually Abused: Not on file   Additional social history is notable that he is married has 2 children. Has one grandchild. He works in Education officer, museum and remains busy. He still smoking. He does use occasional alcohol.  Family history is notable that his father died with heart disease at age 55.  ROS General: Negative; No fevers, chills, or night sweats;  HEENT: Negative; No changes in vision or hearing, sinus congestion, difficulty swallowing Pulmonary:  Mild shortness of breath, no hemoptysis Cardiovascular: See HPI GI: Negative; No nausea, vomiting, diarrhea, or abdominal pain GU: Negative; No dysuria, hematuria, or difficulty voiding Musculoskeletal: Negative; no myalgias, joint pain, or  weakness Hematologic/Oncology: Negative; no easy bruising, bleeding Endocrine: Negative; no heat/cold intolerance; no diabetes Neuro: Negative; no changes in balance, headaches Skin: Negative; No rashes or skin lesions Psychiatric: Negative; No behavioral problems, depression Sleep: Negative; No snoring, daytime sleepiness, hypersomnolence, bruxism, restless legs,  hypnogognic hallucinations, no cataplexy Other comprehensive 14 point system review is negative.   PE BP 134/68    Pulse 67    Temp (!) 97.3 F (36.3 C)    Ht 5' 8"  (1.727 m)    Wt 218 lb 9.6 oz (99.2 kg)    SpO2 90%    BMI 33.24 kg/m    Repeat blood pressure by me 128/70  Wt Readings from Last 3 Encounters:  11/05/19 218 lb 9.6 oz (99.2 kg)  10/17/19 223 lb (101.2 kg)  08/02/19 210 lb (95.3 kg)   General: Alert, oriented, no distress.  Skin: normal turgor, no rashes, warm and dry HEENT: Normocephalic, atraumatic. Pupils equal round and reactive to light; sclera anicteric; extraocular muscles intact;  Nose without nasal septal hypertrophy Mouth/Parynx benign; Mallinpatti scale 3 Neck: No JVD, no carotid bruits; normal carotid upstroke Lungs: clear to ausculatation and percussion; no wheezing or rales Chest wall: without tenderness to palpitation Heart: PMI not displaced, RRR, s1 s2 normal, 1/6 systolic murmur, no diastolic murmur, no rubs, gallops, thrills, or heaves Abdomen: soft, nontender; no hepatosplenomehaly, BS+; abdominal aorta nontender and not dilated by palpation. Back: no CVA tenderness Pulses 2+ Musculoskeletal: full range of motion, normal strength, no joint deformities Extremities: no clubbing cyanosis or edema, Homan's sign negative  Neurologic: grossly nonfocal; Cranial nerves grossly wnl Psychologic: Normal mood and affect   ECG (independently read by me): Sinus rhythm with occasional to frequent PVCs with VA retrograde P waves  January 2019 ECG (independently read by me): Sinus bradycardia 59  bpm.  No ectopy.  Normal intervals.  April 2018 ECG (independently read by me): Normal sinus rhythm at 61 bpm.  No ectopy.  Normal intervals.  April 2017 ECG (independently read by me): Normal sinus rhythm with an occasional PVC with retrograde P wave.  November 2015 ECG (independently read by me): Sinus bradycardia 52 bpm  Prior ECG: Sinus rhythm at 61 beats per minute. No ectopy. Normal intervals.  LABS:  BMP Latest Ref Rng & Units 07/25/2019 07/23/2019 07/22/2019  Glucose 70 - 99 mg/dL 92 93 96  BUN 8 - 23 mg/dL 13 20 27(H)  Creatinine 0.61 - 1.24 mg/dL 1.19 1.13 1.47(H)  Sodium 135 - 145 mmol/L 139 136 135  Potassium 3.5 - 5.1 mmol/L 3.9 4.2 3.7  Chloride 98 - 111 mmol/L 104 100 97(L)  CO2 22 - 32 mmol/L 25 27 24   Calcium 8.9 - 10.3 mg/dL 8.8(L) 8.4(L) 8.9   Hepatic Function Latest Ref Rng & Units 07/20/2019  Total Protein 6.5 - 8.1 g/dL 7.6  Albumin 3.5 - 5.0 g/dL 4.1  AST 15 - 41 U/L 32  ALT 0 - 44 U/L 18  Alk Phosphatase 38 - 126 U/L 52  Total Bilirubin 0.3 - 1.2 mg/dL 0.8  Bilirubin, Direct 0.0 - 0.2 mg/dL 0.2   CBC Latest Ref Rng & Units 07/25/2019 07/24/2019 07/23/2019  WBC 4.0 - 10.5 K/uL 8.6 8.3 7.5  Hemoglobin 13.0 - 17.0 g/dL 14.0 14.0 13.6  Hematocrit 39.0 - 52.0 % 41.8 42.0 40.4  Platelets 150 - 400 K/uL 197 185 187    Lab Results  Component Value Date   MCV 97.9 07/25/2019   MCV 99.5 07/24/2019   MCV 98.5 07/23/2019   No results found for: TSH   Lipid Panel     Component Value Date/Time   CHOL 288 (H) 07/21/2019 0338   TRIG 79 07/21/2019 0338   HDL 76 07/21/2019 0338   CHOLHDL 3.8 07/21/2019 6811  VLDL 16 07/21/2019 0338   LDLCALC 196 (H) 07/21/2019 3716    RADIOLOGY: No results found.  IMPRESSION:  1. Coronary artery disease involving native coronary artery of native heart with other form of angina pectoris ([place])   2. Essential hypertension   3. Hyperlipidemia with target LDL less than 70   4. Bilateral carotid artery stenosis   5.  Statin intolerance   6. Tobacco abuse      ASSESSMENT AND PLAN: Jonathan [name] is a 69 year old gentleman who is 15 years since his inferior wall myocardial infarction and emergent catheterization and underwent successful intervention to a totally occluded mid RCA with insertion of tandem drug-eluting Cypher stents.  He had concomitant CAD involving his LAD diagonal vessel. He developed CPK and LFT elevation with statin therapy  also stopped taking all of her lipid-lowering treatment.  He has continued to smoke cigarettes.  He does not routinely exercise but is active doing household chores.  He has been continued on dual antiplatelet therapy with aspirin and Plavix.  He had been on fish oil but had stopped taking this.  I reviewed his recent hospitalization in November 2020 when he presented with non-ST segment elevation MI.  He was found to have severe multivessel CAD for which CABG revascularization surgery was recommended.  Reoperative carotid Doppler imaging showed mild 1 to 39% right ICA stenosis with left ICA velocities in the 40 to 59% range.  However, he ultimately refused the opportunity for bypass.  He states his father had a stroke during a bypass procedure and he does not want this to happen to him.  Presently he is having class III anginal symptomatology chest pain with minimal activity.  I again discussed reconsideration for CABG option but at present he has no interest in LV function is fairly well-preserved and targets are appropriate for surgery.  Presently, I am recommending further titration of isosorbide mononitrate to 60 mg.  I am increasing Toprol-XL to 37.5 mg from his present dose of 25 mg and adding amlodipine for him to take 2.5 mg for the first week and as long as blood pressure is doing well to further titrate to 5 mg.  He received his first dose of Repatha last week.  We will repeat laboratory after 6 injections and I will see him in 3 months for reevaluation.  I again opened  the door for him to reevaluate his decision to reconsider CABG surgery but at present he is headstrong and his not wanting to have this done.  [name], MD, [facility]  11/06/2019 4:26 PM

## 2019-11-06 ENCOUNTER — Encounter: Payer: Self-pay | Admitting: Cardiovascular Disease

## 2019-12-30 ENCOUNTER — Ambulatory Visit (INDEPENDENT_AMBULATORY_CARE_PROVIDER_SITE_OTHER): Payer: Medicare Other | Admitting: Podiatry

## 2019-12-30 ENCOUNTER — Other Ambulatory Visit: Payer: Self-pay

## 2019-12-30 ENCOUNTER — Encounter: Payer: Self-pay | Admitting: Podiatry

## 2019-12-30 ENCOUNTER — Telehealth: Payer: Self-pay | Admitting: Cardiovascular Disease

## 2019-12-30 DIAGNOSIS — B351 Tinea unguium: Secondary | ICD-10-CM | POA: Diagnosis not present

## 2019-12-30 DIAGNOSIS — M79675 Pain in left toe(s): Secondary | ICD-10-CM

## 2019-12-30 DIAGNOSIS — E785 Hyperlipidemia, unspecified: Secondary | ICD-10-CM

## 2019-12-30 DIAGNOSIS — L84 Corns and callosities: Secondary | ICD-10-CM

## 2019-12-30 DIAGNOSIS — M79674 Pain in right toe(s): Secondary | ICD-10-CM

## 2019-12-30 DIAGNOSIS — Z7901 Long term (current) use of anticoagulants: Secondary | ICD-10-CM | POA: Diagnosis not present

## 2019-12-30 DIAGNOSIS — Z789 Other specified health status: Secondary | ICD-10-CM

## 2019-12-30 DIAGNOSIS — I25118 Atherosclerotic heart disease of native coronary artery with other forms of angina pectoris: Secondary | ICD-10-CM

## 2019-12-30 DIAGNOSIS — I6523 Occlusion and stenosis of bilateral carotid arteries: Secondary | ICD-10-CM

## 2019-12-30 MED ORDER — AMLODIPINE BESYLATE 5 MG PO TABS: 5.0000 mg | ORAL_TABLET | Freq: Every day | ORAL | 2 refills | Status: DC

## 2019-12-30 NOTE — Telephone Encounter (Signed)
Rx(s) sent to pharmacy electronically.  

## 2019-12-30 NOTE — Telephone Encounter (Signed)
New message   Patient needs a new prescription for amLODipine (NORVASC) 5 MG tablet sent to [facility] [place], [address].

## 2020-01-13 NOTE — Progress Notes (Signed)
Subjective: 69 y.o. returns the office today for painful, elongated, thickened toenails which he cannot trim himself. Denies any redness or drainage around the nails. Denies any acute changes since last appointment and no new complaints today. Denies any systemic complaints such as fevers, chills, nausea, vomiting.   He is currently on Plavix  PCP: Jonathan Downing, MD   Objective: AAO 3, NAD DP/PT pulses palpable, CRT less than 3 seconds Nails hypertrophic, dystrophic, elongated, brittle, discolored 10. There is tenderness overlying the nails 1-5 bilaterally. There is no surrounding erythema or drainage along the nail sites.  Hyperkeratotic lesions bilateral submetatarsal 5 without any underlying ulceration drainage or any signs of infection. No open lesions or pre-ulcerative lesions are identified. No other areas of tenderness bilateral lower extremities. No overlying edema, erythema, increased warmth. No pain with calf compression, swelling, warmth, erythema.  Assessment: Patient presents with symptomatic onychomycosis, hyperkeratotic lesions  Plan: -Treatment options including alternatives, risks, complications were discussed -Nails sharply debrided 10 without complication/bleeding. -Hyperkeratotic lesion sharply debrided x2 without any complications or bleeding. -Discussed daily foot inspection. If there are any changes, to call the office immediately.  -Follow-up in 3 months or sooner if any problems are to arise. In the meantime, encouraged to call the office with any questions, concerns, changes symptoms.  Celesta Gentile, DPM

## 2020-01-27 ENCOUNTER — Telehealth: Payer: Self-pay | Admitting: *Deleted

## 2020-01-27 NOTE — Telephone Encounter (Signed)
Pt called for a refill of his gout medication or if he needed an appt.

## 2020-01-27 NOTE — Telephone Encounter (Signed)
I informed pt our doctors had not seen him for gout and he would need an appt, and I transferred to schedulers.

## 2020-02-12 ENCOUNTER — Other Ambulatory Visit: Payer: Self-pay

## 2020-02-12 ENCOUNTER — Encounter: Payer: Self-pay | Admitting: Cardiovascular Disease

## 2020-02-12 ENCOUNTER — Ambulatory Visit: Payer: Medicare Other | Admitting: Cardiovascular Disease

## 2020-02-12 VITALS — BP 94/58 | HR 56 | Ht 68.0 in | Wt 213.0 lb

## 2020-02-12 DIAGNOSIS — Z720 Tobacco use: Secondary | ICD-10-CM | POA: Diagnosis not present

## 2020-02-12 DIAGNOSIS — I6523 Occlusion and stenosis of bilateral carotid arteries: Secondary | ICD-10-CM

## 2020-02-12 DIAGNOSIS — E785 Hyperlipidemia, unspecified: Secondary | ICD-10-CM | POA: Diagnosis not present

## 2020-02-12 DIAGNOSIS — I25118 Atherosclerotic heart disease of native coronary artery with other forms of angina pectoris: Secondary | ICD-10-CM

## 2020-02-12 DIAGNOSIS — Z789 Other specified health status: Secondary | ICD-10-CM | POA: Diagnosis not present

## 2020-02-12 MED ORDER — RANOLAZINE ER 500 MG PO TB12: 500.0000 mg | ORAL_TABLET | Freq: Two times a day (BID) | ORAL | 3 refills | Status: DC

## 2020-02-12 NOTE — Patient Instructions (Signed)
Medication Instructions:  BEGIN TAKING RANEXA (RANOLAZINE) 500MG  TWICE A DAY  *If you need a refill on your cardiac medications before your next appointment, please call your pharmacy*   Lab Work: IN ONE MONTH: FASTING LABS CMET LIPID  If you have labs (blood work) drawn today and your tests are completely normal, you will receive your results only by: [name] MyChart Message (if you have MyChart) OR . A paper copy in the mail If you have any lab test that is abnormal or we need to change your treatment, we will call you to review the results.   Follow-Up: At [facility], you and your health needs are our priority.  As part of our continuing mission to provide you with exceptional heart care, we have created designated Provider Care Teams.  These Care Teams include your primary Cardiologist (physician) and Advanced Practice Providers (APPs -  Physician Assistants and Nurse Practitioners) who all work together to provide you with the care you need, when you need it.  We recommend signing up for the patient portal called "MyChart".  Sign up information is provided on this After Visit Summary.  MyChart is used to connect with patients for Virtual Visits (Telemedicine).  Patients are able to view lab/test results, encounter notes, upcoming appointments, etc.  Non-urgent messages can be sent to your provider as well.   To learn more about what you can do with MyChart, go to [email or web address].    Your next appointment:   6-8 week(s)  The format for your next appointment:   In Person  Provider:   Shelva Majestic, MD

## 2020-02-12 NOTE — Progress Notes (Signed)
Patient ID: Jonathan [name], male   DOB: May 05, 1951, 69 y.o.   MRN: [id number]    Primary M.D.: Dr. Claris Gower  HPI: Jonathan [name] is a 69 y.o. male who presents to the office today for a 3 month cardiology evaluation.    [name] has a history of coronary artery disease and on 01/11/2004 suffered an inferior wall myocardial infarction. Cardiac catheterization revealed total occlusion of the RCA and underwent successful intervention with ultimate insertion of a 3.0x33 mm and 3.0x13 mm drug-eluting Cipher stent with restoration of TIMI-3 flow. Additional problems include hyperlipidemia and ongoing tobacco use.  He admits to smoking at least one pack of cigarettes per day. He has a history of hypertension and has been on ramipril 10 mg in addition to Toprol-XL 25 mg.  He's been maintained on dual antiplatelet therapy with aspirin and Plavix.  He also takes Advair Diskus for his lung disease.  He has had intolerance to statins secondary to increased LFTs as well as CPK elevation. He had been on WelChol, fenofibrate, Zetia but apparently has not taking any lipid lowering therapy presently. His last nuclear perfusion study in March 2013 was unchanged from prior studies and did not show any evidence for scar or ischemia with only minimal residual basilar inferior thinning.  There was significant myocardial salvage from his successful reperfusion in the setting of his MI.Ejection fraction was 57%.  He sees Dr. Claris Gower, for primary care.  Laboratory by Dr. Arelia Sneddon in 07/15/2014 showed normal renal function with a BUN of 11,Cr0.98.  His lipids were markedly abnormal with a total cholesterol of 270, triglycerides of 368, VLDL of 74, and LDL cholesterol of 152.  HDL cholesterol was 44. Hemoglobin C was 5.6.  When I saw him in April 2018, he denied any episodes of chest pain or palpitations.  Unfortunately, he was continuing to smoke and had started smoking at age 38.  He has COPD.  I last saw him in the  office in January 2019.  Unfortunately he continues to smoke.  He has no interest in quitting.  He has been tried on multiple lipid-lowering agents develop increased LFTs and CPK elevation on statins.  In addition, I've tried WelChol, fenofibrate, and Zetia.  I last saw him, I discussed the possibility of Repatha.  He had undergone repeat laboratory in July 2018, which continue to show a total cholesterol 238, triglycerides 308, HDL 47, VLDL 62, and LDL cholesterol at 129.    [name] was admitted to [facility] July 30, 2019 with a non-ST segment elevation MI.  At that time, I performed repeat cardiac catheterization after he consented to changes DNR status for the catheterization.  This revealed significant multivessel CAD with 90% diffuse proximal stenosis in the first diagonal branch of the LAD with 70% LAD stenosis beyond the diagonal takeoff.  There was 95% ostial left circumflex stenosis the vessel immediately bifurcating into the AV groove and circumflex.  The RCA had 40% the previously placed proximal stent with focal 80% mid distal in-stent restenosis and new 70% stenosis beyond the stented segment proximal to the acute margin large dominant RCA.  An echo Doppler study revealed EF of 55 to 60% without LVH.  ``Following the catheterization we discussed consideration for possible CABG revascularization surgery and obtain surgical consultation.  Apparently, the patient refused to consider CABG revascularization ultimately was subsequently discharged.  He has  been seen by Roby Lofts, PA-C in the office on August 02, 2019.  He  again stated his desire not to have surgery.  I saw him for his post hospital evaluation with me on November 05, 2019.  At that time he admitted to experiencing almost daily episodes of angina for several months.  He was not very active.  He also admits to exertional dyspnea.  He  reduced his 1 pack/day tobacco habit down to 1/2 pack/day.  He has been smoking for over  60 years.  During his hospitalization, he had marked hyperlipidemia with total cholesterol 288, LDL cholesterol 196.  He was ultimately approved for Repatha and received his first PCSK9 inhibitor injection on October 28, 2019.  During that evaluation, I recommended the further titration of isosorbide mononitrate to 60 mg and increase Toprol-XL to 37.5 mg from his present dose of 25 mg.  I also recommended the addition of amlodipine 2.5 mg for the first week and then as long as his blood pressure is doing well he could further increase this to 5 mg.  He did not feel the 2.5 mg dose was effective, never titrated it and ultimately stopped taking it.    Presently, continue to experience frequent anginal symptoms with activity.  He has received 8 injections of Repatha.  He denies any palpitations.  He continues to smoke 1/2 pack of cigarettes per day.  He presents for evaluation.  Past Medical History:  Diagnosis Date  . Arthritis    hands   . Chronic bronchitis ([place])   . Coronary artery disease   . Hyperlipidemia    has not tolerated statins,  would not pursue PSK9 inhibitor as advised by Dr Claiborne Billings  . Hypertension   . Myocardial infarction ([place])   . Tobacco abuse     Past Surgical History:  Procedure Laterality Date  . CARDIAC CATHETERIZATION    . CARDIOVASCULAR STRESS TEST  11/11/2011  . CORONARY ANGIOPLASTY     stents- 01/2004   . heart stents  01/2004  . INGUINAL HERNIA REPAIR Right 12/06/2012   Procedure: HERNIA REPAIR INGUINAL ADULT;  Surgeon: [name], DO;  Location: [facility];  Service: General;  Laterality: Right;  . INSERTION OF MESH Right 12/06/2012   Procedure: INSERTION OF MESH;  Surgeon: [name], DO;  Location: [facility];  Service: General;  Laterality: Right;  . LEFT HEART CATH AND CORONARY ANGIOGRAPHY N/A 07/22/2019   Procedure: LEFT HEART CATH AND CORONARY ANGIOGRAPHY;  Surgeon: [name], MD;  Location: [facility];  Service: Cardiovascular;  Laterality: N/A;     Allergies  Allergen Reactions  . Niaspan [Niacin Er] Itching and Other (See Comments)    insomnia  . Statins     LFT elevavation, CK elevevation    Current Outpatient Medications  Medication Sig Dispense Refill  . albuterol (PROVENTIL HFA;VENTOLIN HFA) 108 (90 Base) MCG/ACT inhaler Inhale 2 puffs into the lungs every 4 (four) hours as needed for wheezing or shortness of breath (cough, shortness of breath or wheezing.). (Patient taking differently: Inhale 2 puffs into the lungs every 4 (four) hours as needed for wheezing or shortness of breath (cough). ) 1 Inhaler 5  . amLODipine (NORVASC) 5 MG tablet Take 1 tablet (5 mg total) by mouth daily. 90 tablet 2  . aspirin EC 81 MG tablet Take 81 mg by mouth every morning.    . budesonide-formoterol (SYMBICORT) 160-4.5 MCG/ACT inhaler Inhale 2 puffs into the lungs 2 (two) times daily. (Patient taking differently: Inhale 2 puffs into the lungs 2 (two) times daily as needed (shortness of  breath/wheezing). ) 1 Inhaler 11  . clopidogrel (PLAVIX) 75 MG tablet TAKE 1 TABLET BY MOUTH  DAILY (Patient taking differently: Take 75 mg by mouth every morning. ) 90 tablet 3  . colchicine 0.6 MG tablet Take 0.6 mg by mouth 2 (two) times daily.    . Evolocumab (REPATHA SURECLICK) 824 MG/ML SOAJ Inject 140 mg into the skin every 14 (fourteen) days. 2 pen 11  . isosorbide mononitrate (IMDUR) 60 MG 24 hr tablet Take 1 tablet (60 mg total) by mouth daily. 30 tablet 0  . metoprolol succinate (TOPROL-XL) 25 MG 24 hr tablet Take 1.5 tablets (37.5 mg total) by mouth daily. 90 tablet 3  . nitroGLYCERIN (NITROSTAT) 0.4 MG SL tablet Place 1 tablet (0.4 mg total) under the tongue every 5 (five) minutes as needed for chest pain. 25 tablet 12  . ramipril (ALTACE) 10 MG capsule TAKE 1 CAPSULE BY MOUTH  DAILY (Patient taking differently: Take 10 mg by mouth every morning. ) 90 capsule 3  . ranolazine (RANEXA) 500 MG 12 hr tablet Take 1 tablet (500 mg total) by mouth 2 (two)  times daily. 180 tablet 3   No current facility-administered medications for this visit.    Social History   Socioeconomic History  . Marital status: Married    Spouse name: Not on file  . Number of children: Not on file  . Years of education: Not on file  . Highest education level: Not on file  Occupational History  . Not on file  Tobacco Use  . Smoking status: Current Every Day Smoker    Packs/day: 1.00    Years: 53.00    Pack years: 53.00    Types: Cigarettes  . Smokeless tobacco: Never Used  Substance and Sexual Activity  . Alcohol use: Yes    Alcohol/week: 5.0 standard drinks    Types: 5 Standard drinks or equivalent per week    Comment: consumes 1 pint per day   . Drug use: No  . Sexual activity: Yes  Other Topics Concern  . Not on file  Social History Narrative   Retired   Lives in [place]      Wishes to be DNR   Social Determinants of Radio broadcast assistant Strain:   . Difficulty of Paying Living Expenses:   Food Insecurity:   . Worried About Charity fundraiser in the Last Year:   . Arboriculturist in the Last Year:   Transportation Needs:   . Film/video editor (Medical):   [name] Lack of Transportation (Non-Medical):   Physical Activity:   . Days of Exercise per Week:   . Minutes of Exercise per Session:   Stress:   . Feeling of Stress :   Social Connections:   . Frequency of Communication with Friends and Family:   . Frequency of Social Gatherings with Friends and Family:   . Attends Religious Services:   . Active Member of Clubs or Organizations:   . Attends Archivist Meetings:   [name] Marital Status:   Intimate Partner Violence:   . Fear of Current or Ex-Partner:   . Emotionally Abused:   [name] Physically Abused:   . Sexually Abused:    Additional social history is notable that he is married has 2 children. Has one grandchild. He works in Education officer, museum and remains busy. He still smoking. He does use occasional  alcohol.  Family history is notable that his father died with heart disease  at age 34.  ROS General: Negative; No fevers, chills, or night sweats;  HEENT: Negative; No changes in vision or hearing, sinus congestion, difficulty swallowing Pulmonary:  Mild shortness of breath, no hemoptysis Cardiovascular: See HPI GI: Negative; No nausea, vomiting, diarrhea, or abdominal pain GU: Negative; No dysuria, hematuria, or difficulty voiding Musculoskeletal: Negative; no myalgias, joint pain, or weakness Hematologic/Oncology: Negative; no easy bruising, bleeding Endocrine: Negative; no heat/cold intolerance; no diabetes Neuro: Negative; no changes in balance, headaches Skin: Negative; No rashes or skin lesions Psychiatric: Negative; No behavioral problems, depression Sleep: Negative; No snoring, daytime sleepiness, hypersomnolence, bruxism, restless legs, hypnogognic hallucinations, no cataplexy Other comprehensive 14 point system review is negative.   PE BP (!) 94/58   Pulse (!) 56   Ht 5' 8"  (1.727 m)   Wt 213 lb (96.6 kg)   SpO2 93%   BMI 32.39 kg/m    Repeat blood pressure by me was 104/62 supine and 104/60 standing.  Wt Readings from Last 3 Encounters:  02/12/20 213 lb (96.6 kg)  11/05/19 218 lb 9.6 oz (99.2 kg)  10/17/19 223 lb (101.2 kg)   General: Alert, oriented, no distress.  Skin: normal turgor, no rashes, warm and dry HEENT: Normocephalic, atraumatic. Pupils equal round and reactive to light; sclera anicteric; extraocular muscles intact;  Nose without nasal septal hypertrophy Mouth/Parynx benign; Mallinpatti scale 3 Neck: No JVD, no carotid bruits; normal carotid upstroke Lungs: clear to ausculatation and percussion; no wheezing or rales Chest wall: without tenderness to palpitation Heart: PMI not displaced, RRR, s1 s2 normal, 1/6 systolic murmur, no diastolic murmur, no rubs, gallops, thrills, or heaves Abdomen: soft, nontender; no hepatosplenomehaly, BS+; abdominal  aorta nontender and not dilated by palpation. Back: no CVA tenderness Pulses 2+ Musculoskeletal: full range of motion, normal strength, no joint deformities Extremities: no clubbing cyanosis or edema, Homan's sign negative  Neurologic: grossly nonfocal; Cranial nerves grossly wnl Psychologic: Normal mood and affect   ECG (independently read by me): Sinus bradycardia at 50 bpm.  PR interval 192 ms.  QTc interval 417 ms.  No significant ST-T changes.  No ectopy.  November 05, 2019 ECG (independently read by me): Sinus rhythm with occasional to frequent PVCs with VA retrograde P waves  January 2019 ECG (independently read by me): Sinus bradycardia 59 bpm.  No ectopy.  Normal intervals.  April 2018 ECG (independently read by me): Normal sinus rhythm at 61 bpm.  No ectopy.  Normal intervals.  April 2017 ECG (independently read by me): Normal sinus rhythm with an occasional PVC with retrograde P wave.  November 2015 ECG (independently read by me): Sinus bradycardia 52 bpm  Prior ECG: Sinus rhythm at 61 beats per minute. No ectopy. Normal intervals.  LABS:  BMP Latest Ref Rng & Units 07/25/2019 07/23/2019 07/22/2019  Glucose 70 - 99 mg/dL 92 93 96  BUN 8 - 23 mg/dL 13 20 27(H)  Creatinine 0.61 - 1.24 mg/dL 1.19 1.13 1.47(H)  Sodium 135 - 145 mmol/L 139 136 135  Potassium 3.5 - 5.1 mmol/L 3.9 4.2 3.7  Chloride 98 - 111 mmol/L 104 100 97(L)  CO2 22 - 32 mmol/L 25 27 24   Calcium 8.9 - 10.3 mg/dL 8.8(L) 8.4(L) 8.9   Hepatic Function Latest Ref Rng & Units 07/20/2019  Total Protein 6.5 - 8.1 g/dL 7.6  Albumin 3.5 - 5.0 g/dL 4.1  AST 15 - 41 U/L 32  ALT 0 - 44 U/L 18  Alk Phosphatase 38 - 126 U/L 52  Total Bilirubin  0.3 - 1.2 mg/dL 0.8  Bilirubin, Direct 0.0 - 0.2 mg/dL 0.2   CBC Latest Ref Rng & Units 07/25/2019 07/24/2019 07/23/2019  WBC 4.0 - 10.5 K/uL 8.6 8.3 7.5  Hemoglobin 13.0 - 17.0 g/dL 14.0 14.0 13.6  Hematocrit 39.0 - 52.0 % 41.8 42.0 40.4  Platelets 150 - 400 K/uL 197 185  187    Lab Results  Component Value Date   MCV 97.9 07/25/2019   MCV 99.5 07/24/2019   MCV 98.5 07/23/2019   No results found for: TSH   Lipid Panel     Component Value Date/Time   CHOL 288 (H) 07/21/2019 0338   TRIG 79 07/21/2019 0338   HDL 76 07/21/2019 0338   CHOLHDL 3.8 07/21/2019 0338   VLDL 16 07/21/2019 0338   LDLCALC 196 (H) 07/21/2019 0338    RADIOLOGY: No results found.  IMPRESSION:  1. Coronary artery disease involving native coronary artery of native heart with other form of angina pectoris ([place])   2. Hyperlipidemia with target LDL less than 70   3. Statin intolerance   4. Tobacco abuse   5. Bilateral carotid artery stenosis      ASSESSMENT AND PLAN: [name] is a 69 year old gentleman who is 16 years since his inferior wall myocardial infarction and emergent catheterization in May 2005 and underwent successful intervention to a totally occluded mid RCA with insertion of tandem drug-eluting Cypher stents.  He had concomitant CAD involving his LAD diagonal vessel. He developed CPK and LFT elevation with statin therapy also stopped taking all of her lipid-lowering treatment.  He has continued to smoke cigarettes.  He was hospitalized in November 2020 when he presented with non-ST segment elevation MI.  He was found to have severe multivessel CAD for which CABG revascularization surgery was recommended.  Preoperative carotid Doppler imaging showed mild 1 to 39% right ICA stenosis with left ICA velocities in the 40 to 59% range.  However, he ultimately refused the opportunity for bypass.  He states his father had a stroke during a bypass procedure and he does not want this to happen to him.  He is adamant that he does not want to pursue surgery.  At his last office visit he was having class III symptoms of angina.  He was having occasional PVCs.  His ECG today now shows sinus bradycardia at 50 without ectopy on his slightly increase metoprolol succinate at 37.5 mg.   He continues to be on isosorbide now at 60 mg daily.  He never titrated amlodipine from 2.5 mg and felt it was not helping so therefore stopped taking it.  His blood pressure today is stable but on the low side without orthostatic change.  I believe he is a good candidate for initiation of ranolazine and will start him at 500 mg twice a day.  QTc interval today is normal.  I discussed with him that as long as he tolerates this medication we will most likely further titrate this to 1000 mg twice a day.  I am recommending follow-up lipid panel and chemistry profile in 1 month after 3 months of Repatha injections.  I will see him in the office in 2 months for follow-up evaluation.  [name], MD, [facility]  02/13/2020 6:47 PM

## 2020-02-13 ENCOUNTER — Encounter: Payer: Self-pay | Admitting: Cardiovascular Disease

## 2020-02-18 ENCOUNTER — Other Ambulatory Visit: Payer: Self-pay | Admitting: Medical

## 2020-02-19 NOTE — Telephone Encounter (Signed)
This is Dr. Kelly's pt. °

## 2020-02-20 ENCOUNTER — Other Ambulatory Visit (INDEPENDENT_AMBULATORY_CARE_PROVIDER_SITE_OTHER): Payer: Medicare Other

## 2020-02-20 DIAGNOSIS — I10 Essential (primary) hypertension: Secondary | ICD-10-CM | POA: Diagnosis not present

## 2020-03-25 ENCOUNTER — Encounter: Payer: Self-pay | Admitting: Cardiovascular Disease

## 2020-03-31 ENCOUNTER — Encounter: Payer: Self-pay | Admitting: Podiatry

## 2020-03-31 ENCOUNTER — Ambulatory Visit: Payer: Medicare Other | Admitting: Podiatry

## 2020-03-31 ENCOUNTER — Other Ambulatory Visit: Payer: Self-pay

## 2020-03-31 DIAGNOSIS — M79674 Pain in right toe(s): Secondary | ICD-10-CM | POA: Diagnosis not present

## 2020-03-31 DIAGNOSIS — Q828 Other specified congenital malformations of skin: Secondary | ICD-10-CM | POA: Diagnosis not present

## 2020-03-31 DIAGNOSIS — B351 Tinea unguium: Secondary | ICD-10-CM

## 2020-03-31 DIAGNOSIS — M79671 Pain in right foot: Secondary | ICD-10-CM

## 2020-03-31 DIAGNOSIS — M79672 Pain in left foot: Secondary | ICD-10-CM | POA: Diagnosis not present

## 2020-03-31 DIAGNOSIS — M79675 Pain in left toe(s): Secondary | ICD-10-CM

## 2020-03-31 DIAGNOSIS — Z7901 Long term (current) use of anticoagulants: Secondary | ICD-10-CM

## 2020-04-03 NOTE — Progress Notes (Signed)
Subjective: Jonathan [name] is a pleasant 69 y.o. male patient seen today painful callus(es) b/l feet and painful thick toenails that are difficult to trim. Pain interferes with ambulation. Aggravating factors include wearing enclosed shoe gear. Pain is relieved with periodic professional debridement.  Past Medical History:  Diagnosis Date   Arthritis    hands    Chronic bronchitis ([place])    Coronary artery disease    Hyperlipidemia    has not tolerated statins,  would not pursue PSK9 inhibitor as advised by Dr Claiborne Billings   Hypertension    Myocardial infarction [facility])    Tobacco abuse     Patient Active Problem List   Diagnosis Date Noted   Coronary artery disease involving native coronary artery of native heart with unstable angina pectoris ([place])    Cigarette smoker 04/17/2018   DOE (dyspnea on exertion) 04/16/2018   Chronic sinusitis of both maxillary sinuses 04/16/2018   COPD GOLD II   active smoker 04/16/2018   Obesity (BMI 30.0-34.9) 12/16/2015   Tobacco abuse counseling 08/05/2014   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   Essential hypertension, benign 09/23/2012   NSTEMI (non-ST elevated myocardial infarction) ([place]) 09/23/2012   Hyperlipidemia 09/23/2012   Hernia, inguinal 09/23/2012    Current Outpatient Medications on File Prior to Visit  Medication Sig Dispense Refill   albuterol (PROVENTIL HFA;VENTOLIN HFA) 108 (90 Base) MCG/ACT inhaler Inhale 2 puffs into the lungs every 4 (four) hours as needed for wheezing or shortness of breath (cough, shortness of breath or wheezing.). (Patient taking differently: Inhale 2 puffs into the lungs every 4 (four) hours as needed for wheezing or shortness of breath (cough). ) 1 Inhaler 5   amLODipine (NORVASC) 5 MG tablet Take 1 tablet (5 mg total) by mouth daily. 90 tablet 2   aspirin EC 81 MG tablet Take 81 mg by mouth every morning.     budesonide-formoterol (SYMBICORT) 160-4.5 MCG/ACT inhaler Inhale 2 puffs into the  lungs 2 (two) times daily. (Patient taking differently: Inhale 2 puffs into the lungs 2 (two) times daily as needed (shortness of breath/wheezing). ) 1 Inhaler 11   clopidogrel (PLAVIX) 75 MG tablet TAKE 1 TABLET BY MOUTH  DAILY (Patient taking differently: Take 75 mg by mouth every morning. ) 90 tablet 3   colchicine 0.6 MG tablet Take 0.6 mg by mouth 2 (two) times daily.     Evolocumab (REPATHA SURECLICK) 409 MG/ML SOAJ Inject 140 mg into the skin every 14 (fourteen) days. 2 pen 11   isosorbide mononitrate (IMDUR) 60 MG 24 hr tablet TAKE 1 TABLET BY MOUTH  DAILY 90 tablet 3   metoprolol succinate (TOPROL-XL) 25 MG 24 hr tablet Take 1.5 tablets (37.5 mg total) by mouth daily. 90 tablet 3   nitroGLYCERIN (NITROSTAT) 0.4 MG SL tablet Place 1 tablet (0.4 mg total) under the tongue every 5 (five) minutes as needed for chest pain. 25 tablet 12   ramipril (ALTACE) 10 MG capsule TAKE 1 CAPSULE BY MOUTH  DAILY (Patient taking differently: Take 10 mg by mouth every morning. ) 90 capsule 3   ranolazine (RANEXA) 500 MG 12 hr tablet Take 1 tablet (500 mg total) by mouth 2 (two) times daily. 180 tablet 3   No current facility-administered medications on file prior to visit.    Allergies  Allergen Reactions   Niaspan [Niacin Er] Itching and Other (See Comments)    insomnia   Statins     LFT elevavation, CK elevevation    Objective: Physical Exam  General: ALAMIN MCCUISTON is a pleasant 69 y.o. Caucasian male, WD, WN in NAD. AAO x 3.   Vascular:  Neurovascular status unchanged b/l lower extremities. Capillary fill time to digits <3 seconds b/l lower extremities. Palpable pedal pulses b/l LE. Pedal hair sparse. Lower extremity skin temperature gradient within normal limits. No pain with calf compression b/l. No edema noted b/l lower extremities. No ischemia or gangrene noted b/l lower extremities.  Dermatological:  Pedal skin with normal turgor, texture and tone bilaterally. No open wounds  bilaterally. No interdigital macerations bilaterally. Toenails 1-5 b/l elongated, discolored, dystrophic, thickened, crumbly with subungual debris and tenderness to dorsal palpation. Porokeratotic lesion(s) submet head 5 left foot and submet head 5 right foot. No erythema, no edema, no drainage, no flocculence.  Musculoskeletal:  Normal muscle strength 5/5 to all lower extremity muscle groups bilaterally. No pain crepitus or joint limitation noted with ROM b/l. No gross bony deformities bilaterally. Patient ambulates independent of any assistive aids.  Neurological:  Protective sensation intact 5/5 intact bilaterally with 10g monofilament b/l. Vibratory sensation intact b/l. Proprioception intact bilaterally. Clonus negative b/l.  Assessment and Plan:  1. Pain due to onychomycosis of toenails of both feet   2. Porokeratosis   3. Chronic anticoagulation   4. Pain in both feet    -Examined patient. -No new findings. No new orders. -Toenails 1-5 b/l were debrided in length and girth with sterile nail nippers and dremel without iatrogenic bleeding.  -Painful porokeratotic lesion(s) submet head 5 left foot and submet head 5 right foot pared and enucleated with sterile scalpel blade without incident. -Patient to report any pedal injuries to medical professional immediately. -Patient to continue soft, supportive shoe gear daily. -Patient/POA to call should there be question/concern in the interim.  Return in about 3 months (around 07/01/2020) for nail trim.  Marzetta Board, DPM

## 2020-04-09 ENCOUNTER — Ambulatory Visit: Payer: Medicare Other | Admitting: Cardiovascular Disease

## 2020-05-27 ENCOUNTER — Other Ambulatory Visit: Payer: Self-pay | Admitting: Cardiovascular Disease

## 2020-06-22 ENCOUNTER — Other Ambulatory Visit: Payer: Self-pay | Admitting: Cardiovascular Disease

## 2020-06-22 ENCOUNTER — Other Ambulatory Visit: Payer: Self-pay | Admitting: Physician Assistant

## 2020-06-22 DIAGNOSIS — I25118 Atherosclerotic heart disease of native coronary artery with other forms of angina pectoris: Secondary | ICD-10-CM

## 2020-06-22 DIAGNOSIS — Z789 Other specified health status: Secondary | ICD-10-CM

## 2020-06-22 DIAGNOSIS — I6523 Occlusion and stenosis of bilateral carotid arteries: Secondary | ICD-10-CM

## 2020-06-22 DIAGNOSIS — E785 Hyperlipidemia, unspecified: Secondary | ICD-10-CM

## 2020-07-07 ENCOUNTER — Ambulatory Visit: Payer: Medicare Other | Admitting: Podiatry

## 2020-08-03 ENCOUNTER — Other Ambulatory Visit: Payer: Self-pay | Admitting: Cardiovascular Disease

## 2020-08-03 ENCOUNTER — Encounter: Payer: Self-pay | Admitting: Cardiovascular Disease

## 2020-08-03 ENCOUNTER — Ambulatory Visit: Payer: Medicare Other | Admitting: Cardiovascular Disease

## 2020-08-03 ENCOUNTER — Other Ambulatory Visit: Payer: Self-pay

## 2020-08-03 DIAGNOSIS — R0600 Dyspnea, unspecified: Secondary | ICD-10-CM

## 2020-08-03 DIAGNOSIS — R0609 Other forms of dyspnea: Secondary | ICD-10-CM

## 2020-08-03 DIAGNOSIS — I6523 Occlusion and stenosis of bilateral carotid arteries: Secondary | ICD-10-CM

## 2020-08-03 DIAGNOSIS — I25118 Atherosclerotic heart disease of native coronary artery with other forms of angina pectoris: Secondary | ICD-10-CM

## 2020-08-03 DIAGNOSIS — Z789 Other specified health status: Secondary | ICD-10-CM

## 2020-08-03 DIAGNOSIS — E785 Hyperlipidemia, unspecified: Secondary | ICD-10-CM | POA: Diagnosis not present

## 2020-08-03 DIAGNOSIS — Z79899 Other long term (current) drug therapy: Secondary | ICD-10-CM

## 2020-08-03 DIAGNOSIS — I214 Non-ST elevation (NSTEMI) myocardial infarction: Secondary | ICD-10-CM

## 2020-08-03 DIAGNOSIS — Z720 Tobacco use: Secondary | ICD-10-CM

## 2020-08-03 DIAGNOSIS — I25110 Atherosclerotic heart disease of native coronary artery with unstable angina pectoris: Secondary | ICD-10-CM

## 2020-08-03 DIAGNOSIS — I10 Essential (primary) hypertension: Secondary | ICD-10-CM

## 2020-08-03 MED ORDER — RANOLAZINE ER 1000 MG PO TB12: 1000.0000 mg | ORAL_TABLET | Freq: Two times a day (BID) | ORAL | 11 refills | Status: DC

## 2020-08-03 MED ORDER — ISOSORBIDE MONONITRATE ER 60 MG PO TB24: ORAL_TABLET | ORAL | 3 refills | Status: DC

## 2020-08-03 NOTE — Progress Notes (Signed)
Patient ID: Jonathan [name], male   DOB: 01-May-1951, 69 y.o.   MRN: [id number]    Primary M.D.: Dr. Claris Gower  HPI: Jonathan [name] is a 69 y.o. male who presents to the office today for a 5 month cardiology evaluation.  Chief complaint is ongoing chest pain as he walked into the office stating he needed a renewal for his sublingual nitroglycerin   Jonathan [name] has a history of coronary artery disease and on 01/11/2004 suffered an inferior wall myocardial infarction. Cardiac catheterization revealed total occlusion of the RCA and underwent successful intervention with ultimate insertion of a 3.0x33 mm and 3.0x13 mm drug-eluting Cipher stent with restoration of TIMI-3 flow. Additional problems include hyperlipidemia and ongoing tobacco use.  He admits to smoking at least one pack of cigarettes per day. He has a history of hypertension and has been on ramipril 10 mg in addition to Toprol-XL 25 mg.  He's been maintained on dual antiplatelet therapy with aspirin and Plavix.  He also takes Advair Diskus for his lung disease.  He has had intolerance to statins secondary to increased LFTs as well as CPK elevation. He had been on WelChol, fenofibrate, Zetia but apparently has not taking any lipid lowering therapy presently. His last nuclear perfusion study in March 2013 was unchanged from prior studies and did not show any evidence for scar or ischemia with only minimal residual basilar inferior thinning.  There was significant myocardial salvage from his successful reperfusion in the setting of his MI.Ejection fraction was 57%.  He sees Dr. Claris Gower, for primary care.  Laboratory by Dr. Arelia Sneddon in 07/15/2014 showed normal renal function with a BUN of 11,Cr0.98.  His lipids were markedly abnormal with a total cholesterol of 270, triglycerides of 368, VLDL of 74, and LDL cholesterol of 152.  HDL cholesterol was 44. Hemoglobin C was 5.6.  When I saw him in April 2018, he denied any episodes of chest pain or  palpitations.  Unfortunately, he was continuing to smoke and had started smoking at age 28.  He has COPD.  I last saw him in the office in January 2019.  Unfortunately he continues to smoke.  He has no interest in quitting.  He has been tried on multiple lipid-lowering agents develop increased LFTs and CPK elevation on statins.  In addition, I've tried WelChol, fenofibrate, and Zetia.  I last saw him, I discussed the possibility of Repatha.  He had undergone repeat laboratory in July 2018, which continue to show a total cholesterol 238, triglycerides 308, HDL 47, VLDL 62, and LDL cholesterol at 129.    Jonathan [name] was admitted to [facility] July 30, 2019 with a non-ST segment elevation MI.  At that time, I performed repeat cardiac catheterization after he consented to changes DNR status for the catheterization.  This revealed significant multivessel CAD with 90% diffuse proximal stenosis in the first diagonal branch of the LAD with 70% LAD stenosis beyond the diagonal takeoff.  There was 95% ostial left circumflex stenosis the vessel immediately bifurcating into the AV groove and circumflex.  The RCA had 40% the previously placed proximal stent with focal 80% mid distal in-stent restenosis and new 70% stenosis beyond the stented segment proximal to the acute margin large dominant RCA.  An echo Doppler study revealed EF of 55 to 60% without LVH.  ``Following the catheterization we discussed consideration for possible CABG revascularization surgery and obtain surgical consultation.  Apparently, the patient refused to consider CABG revascularization ultimately was  subsequently discharged.  He has  been seen by Jonathan Lofts, Jonathan [name] in the office on August 02, 2019.  He again stated his desire not to have surgery.  I saw him for his post hospital evaluation with me on November 05, 2019.  At that time he admitted to experiencing almost daily episodes of angina for several months.  He was not very active.   He also admits to exertional dyspnea.  He  reduced his 1 pack/day tobacco habit down to 1/2 pack/day.  He has been smoking for over 60 years.  During his hospitalization, he had marked hyperlipidemia with total cholesterol 288, LDL cholesterol 196.  He was ultimately approved for Repatha and received his first PCSK9 inhibitor injection on October 28, 2019.  During that evaluation, I recommended the further titration of isosorbide mononitrate to 60 mg and increase Toprol-XL to 37.5 mg from his present dose of 25 mg.  I also recommended the addition of amlodipine 2.5 mg for the first week and then as long as his blood pressure is doing well he could further increase this to 5 mg.  He did not feel the 2.5 mg dose was effective, never titrated it and ultimately stopped taking it.    I last saw him in June 2021 at which time he was continuing to experience frequent anginal symptoms with activity.  Unfortunately he was still smoking half pack of cigarettes per day.  He had received 8 Repatha injections over the previous several months.  During that evaluation, I again discussed potential future reconsideration of CABG revascularization.  During that evaluation, his blood pressure was stable but on the low side.  He had never titrated amlodipine from 2.5 mg and felt it was not necessary so therefore stopped taking it.  During that evaluation I recommended initiation of ranolazine at 500 mg twice a day with further plans to titrate.  He walked into the office today complaining of ongoing chest pain.  Immediately upon coming into the room, he was placed on a stretcher but states he could not lie flat so therefore he was somewhat upright.  He was given sublingual nitroglycerin.  His chest pain promptly relieved.  ECG was taken which did not reveal any acute ischemic changes.  He states he typically experiences chest pain when he wakes up and then typically has another episode of chest pain approximately 4 PM in the  afternoon and this occurs almost on a daily basis.  He has been on a regimen of amlodipine which I had titrated to 5 mg at his last visit, aspirin/Plavix, isosorbide 60 mg in the morning, metoprolol succinate 37.5 mg daily, ramipril 10 mg, and Ranexa 500 mg twice a day.  He continues to receive Repatha injections 140 mg every 2 weeks.  He presents for evaluation.  Past Medical History:  Diagnosis Date  . Arthritis    hands   . Chronic bronchitis ([place])   . Chronic sinusitis of both maxillary sinuses 04/16/2018   Sinus CT 04/25/2018 Mild mucoperiosteal thickening involving the frontoethmoidal sinuses. Otherwise largely clear sinuses with no evidence for acute sinusitis.   [name] COPD GOLD II   active smoker 04/16/2018   Spirometry 04/16/2018  FEV1 1.43 (47%)  Ratio 62  s prior rx  - 04/16/2018    try symbicort 160 2bid - PFT's  06/08/2018  FEV1 2.03 (71 % ) ratio 61  p 5 % improvement from saba p nothing prior to study with DLCO  63 % corrects to 72  %  for alv volume    -  06/08/2018  After extensive coaching inhaler device,  effectiveness =    75%  (short ti/ variable flow) > continue symbicort 160 2bid   . Coronary artery disease   . Coronary artery disease involving native coronary artery of native heart with unstable angina pectoris ([place])   . DOE (dyspnea on exertion) 04/16/2018  . Essential hypertension, benign 09/23/2012   Changed acei to arb 04/16/2018 due to pseudowheeze > improved 06/08/2018   . Hernia, inguinal 09/23/2012  . Hyperlipidemia    has not tolerated statins,  would not pursue PSK9 inhibitor as advised by Dr Claiborne Billings  . Hypertension   . Myocardial infarction ([place])   . NSTEMI (non-ST elevated myocardial infarction) ([place]) 09/23/2012  . 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 Tobacco abuse     Past Surgical History:  Procedure Laterality Date  . CARDIAC CATHETERIZATION    . CARDIOVASCULAR STRESS TEST  11/11/2011  . CORONARY ANGIOPLASTY     stents- 01/2004   . heart stents  01/2004  . INGUINAL HERNIA  REPAIR Right 12/06/2012   Procedure: HERNIA REPAIR INGUINAL ADULT;  Surgeon: [name], DO;  Location: [facility];  Service: General;  Laterality: Right;  . INSERTION OF MESH Right 12/06/2012   Procedure: INSERTION OF MESH;  Surgeon: [name], DO;  Location: [facility];  Service: General;  Laterality: Right;  . LEFT HEART CATH AND CORONARY ANGIOGRAPHY N/A 07/22/2019   Procedure: LEFT HEART CATH AND CORONARY ANGIOGRAPHY;  Surgeon: [name], MD;  Location: [place] [facility];  Service: Cardiovascular;  Laterality: N/A;    Allergies  Allergen Reactions  . Niaspan [Niacin Er] Itching and Other (See Comments)    insomnia  . Statins     LFT elevavation, CK elevevation    Current Outpatient Medications  Medication Sig Dispense Refill  . albuterol (PROVENTIL HFA;VENTOLIN HFA) 108 (90 Base) MCG/ACT inhaler Inhale 2 puffs into the lungs every 4 (four) hours as needed for wheezing or shortness of breath (cough, shortness of breath or wheezing.). 1 Inhaler 5  . aspirin EC 81 MG tablet Take 81 mg by mouth every morning.    . budesonide-formoterol (SYMBICORT) 160-4.5 MCG/ACT inhaler Inhale 2 puffs into the lungs 2 (two) times daily. 1 Inhaler 11  . clopidogrel (PLAVIX) 75 MG tablet TAKE 1 TABLET BY MOUTH  DAILY 90 tablet 3  . colchicine 0.6 MG tablet Take 0.6 mg by mouth 2 (two) times daily.    . Evolocumab (REPATHA SURECLICK) 932 MG/ML SOAJ Inject 140 mg into the skin every 14 (fourteen) days. 2 pen 11  . isosorbide mononitrate (IMDUR) 60 MG 24 hr tablet Take to 1 and 1/2 tablet ( 90 mg) in the morning and  1/2 tablet (30 mg ) at bedtime 180 tablet 3  . metoprolol succinate (TOPROL-XL) 25 MG 24 hr tablet Take 1.5 tablets (37.5 mg total) by mouth daily. 135 tablet 1  . nitroGLYCERIN (NITROSTAT) 0.4 MG SL tablet PLACE 1 TABLET UNDER THE TONGUE EVERY 5 MINUTES AS NEEDED FOR CHEST PAIN UP TO 3 DOSES, IF SYMPTOMS PERSIST CALL 911 25 tablet 6  . ramipril (ALTACE) 10 MG capsule TAKE 1 CAPSULE BY MOUTH   DAILY 90 capsule 3  . amLODipine (NORVASC) 5 MG tablet Take 1 tablet (5 mg total) by mouth daily. 90 tablet 2  . ranolazine (RANEXA) 1000 MG SR tablet Take 1 tablet (1,000 mg total) by mouth 2 (two) times daily. 60 tablet 11   No  current facility-administered medications for this visit.    Social History   Socioeconomic History  . Marital status: Married    Spouse name: Not on file  . Number of children: Not on file  . Years of education: Not on file  . Highest education level: Not on file  Occupational History  . Not on file  Tobacco Use  . Smoking status: Current Every Day Smoker    Packs/day: 1.00    Years: 53.00    Pack years: 53.00    Types: Cigarettes  . Smokeless tobacco: Never Used  Vaping Use  . Vaping Use: Never used  Substance and Sexual Activity  . Alcohol use: Yes    Alcohol/week: 5.0 standard drinks    Types: 5 Standard drinks or equivalent per week    Comment: consumes 1 pint per day   . Drug use: No  . Sexual activity: Yes  Other Topics Concern  . Not on file  Social History Narrative   Retired   Lives in [place]      Wishes to be DNR   Social Determinants of Radio broadcast assistant Strain:   . Difficulty of Paying Living Expenses: Not on file  Food Insecurity:   . Worried About Charity fundraiser in the Last Year: Not on file  . Ran Out of Food in the Last Year: Not on file  Transportation Needs:   . Lack of Transportation (Medical): Not on file  . Lack of Transportation (Non-Medical): Not on file  Physical Activity:   . Days of Exercise per Week: Not on file  . Minutes of Exercise per Session: Not on file  Stress:   . Feeling of Stress : Not on file  Social Connections:   . Frequency of Communication with Friends and Family: Not on file  . Frequency of Social Gatherings with Friends and Family: Not on file  . Attends Religious Services: Not on file  . Active Member of Clubs or Organizations: Not on file  . Attends Theatre manager Meetings: Not on file  . Marital Status: Not on file  Intimate Partner Violence:   . Fear of Current or Ex-Partner: Not on file  . Emotionally Abused: Not on file  . Physically Abused: Not on file  . Sexually Abused: Not on file   Additional social history is notable that he is married has 2 children. Has one grandchild. He works in Education officer, museum and remains busy. He still smoking. He does use occasional alcohol.  Family history is notable that his father died with heart disease at age 26.  ROS General: Negative; No fevers, chills, or night sweats;  HEENT: Negative; No changes in vision or hearing, sinus congestion, difficulty swallowing Pulmonary:  Mild shortness of breath, no hemoptysis Cardiovascular: See HPI GI: Negative; No nausea, vomiting, diarrhea, or abdominal pain GU: Negative; No dysuria, hematuria, or difficulty voiding Musculoskeletal: Negative; no myalgias, joint pain, or weakness Hematologic/Oncology: Negative; no easy bruising, bleeding Endocrine: Negative; no heat/cold intolerance; no diabetes Neuro: Negative; no changes in balance, headaches Skin: Negative; No rashes or skin lesions Psychiatric: Negative; No behavioral problems, depression Sleep: Negative; No snoring, daytime sleepiness, hypersomnolence, bruxism, restless legs, hypnogognic hallucinations, no cataplexy Other comprehensive 14 point system review is negative.   PE BP 130/76   Pulse 70   Ht 5' 8"  (1.727 m)   Wt 220 lb (99.8 kg)   BMI 33.45 kg/m    Initial blood pressure was 160/82 prior to  taking the sublingual nitroglycerin.  With chest pain relieved, and more relaxed, repeat blood pressure was 118/74.  Wt Readings from Last 3 Encounters:  08/03/20 220 lb (99.8 kg)  02/12/20 213 lb (96.6 kg)  11/05/19 218 lb 9.6 oz (99.2 kg)   General: Alert, oriented, no distress.  Skin: normal turgor, no rashes, warm and dry HEENT: Normocephalic, atraumatic. Pupils equal round and  reactive to light; sclera anicteric; extraocular muscles intact;  Nose without nasal septal hypertrophy Mouth/Parynx benign; Mallinpatti scale 3 Neck: No JVD, no carotid bruits; normal carotid upstroke Lungs: clear to ausculatation and percussion; no wheezing or rales Chest wall: without tenderness to palpitation Heart: PMI not displaced, RRR, s1 s2 normal, 1/6 systolic murmur, no diastolic murmur, no rubs, gallops, thrills, or heaves Abdomen: soft, nontender; no hepatosplenomehaly, BS+; abdominal aorta nontender and not dilated by palpation. Back: no CVA tenderness Pulses 2+ Musculoskeletal: full range of motion, normal strength, no joint deformities Extremities: no clubbing cyanosis or edema, Homan's sign negative  Neurologic: grossly nonfocal; Cranial nerves grossly wnl Psychologic: Normal mood and affect   ECG (independently read by me): Normal sinus rhythm at 63 bpm.  Nonspecific ST changes.  Normal intervals.  No ectopy  February 12, 2020 ECG (independently read by me): Sinus bradycardia at 50 bpm.  PR interval 192 ms.  QTc interval 417 ms.  No significant ST-T changes.  No ectopy.  November 05, 2019 ECG (independently read by me): Sinus rhythm with occasional to frequent PVCs with VA retrograde P waves  January 2019 ECG (independently read by me): Sinus bradycardia 59 bpm.  No ectopy.  Normal intervals.  April 2018 ECG (independently read by me): Normal sinus rhythm at 61 bpm.  No ectopy.  Normal intervals.  April 2017 ECG (independently read by me): Normal sinus rhythm with an occasional PVC with retrograde P wave.  November 2015 ECG (independently read by me): Sinus bradycardia 52 bpm  Prior ECG: Sinus rhythm at 61 beats per minute. No ectopy. Normal intervals.  LABS:  BMP Latest Ref Rng & Units 07/25/2019 07/23/2019 07/22/2019  Glucose 70 - 99 mg/dL 92 93 96  BUN 8 - 23 mg/dL 13 20 27(H)  Creatinine 0.61 - 1.24 mg/dL 1.19 1.13 1.47(H)  Sodium 135 - 145 mmol/L 139 136 135   Potassium 3.5 - 5.1 mmol/L 3.9 4.2 3.7  Chloride 98 - 111 mmol/L 104 100 97(L)  CO2 22 - 32 mmol/L 25 27 24   Calcium 8.9 - 10.3 mg/dL 8.8(L) 8.4(L) 8.9   Hepatic Function Latest Ref Rng & Units 07/20/2019  Total Protein 6.5 - 8.1 g/dL 7.6  Albumin 3.5 - 5.0 g/dL 4.1  AST 15 - 41 U/L 32  ALT 0 - 44 U/L 18  Alk Phosphatase 38 - 126 U/L 52  Total Bilirubin 0.3 - 1.2 mg/dL 0.8  Bilirubin, Direct 0.0 - 0.2 mg/dL 0.2   CBC Latest Ref Rng & Units 07/25/2019 07/24/2019 07/23/2019  WBC 4.0 - 10.5 K/uL 8.6 8.3 7.5  Hemoglobin 13.0 - 17.0 g/dL 14.0 14.0 13.6  Hematocrit 39 - 52 % 41.8 42.0 40.4  Platelets 150 - 400 K/uL 197 185 187    Lab Results  Component Value Date   MCV 97.9 07/25/2019   MCV 99.5 07/24/2019   MCV 98.5 07/23/2019   No results found for: TSH   Lipid Panel     Component Value Date/Time   CHOL 288 (H) 07/21/2019 0338   TRIG 79 07/21/2019 0338   HDL 76 07/21/2019 0338  CHOLHDL 3.8 07/21/2019 0338   VLDL 16 07/21/2019 0338   LDLCALC 196 (H) 07/21/2019 0338    RADIOLOGY: No results found.  IMPRESSION:  1. Coronary artery disease involving native coronary artery of native heart with unstable angina pectoris ([place])   2. Essential hypertension   3. NSTEMI (non-ST elevated myocardial infarction) ([place])   4. Hyperlipidemia with target LDL less than 70   5. DOE (dyspnea on exertion)   6. Bilateral carotid artery stenosis   7. Statin intolerance   8. Tobacco abuse   9. Medication management      ASSESSMENT AND PLAN: Jonathan [name] is a 57 -year-old gentleman who is 16 years since his inferior wall myocardial infarction and emergent catheterization in May 2005 at which time he underwent successful intervention to a totally occluded mid RCA with insertion of tandem drug-eluting Cypher stents.  He had concomitant CAD involving his LAD and diagonal vessel. He developed CPK and LFT elevation with statin therapy also stopped taking all of her lipid-lowering  treatment.  He has continued to smoke cigarettes.  He was hospitalized in November 2020 when he presented with non-ST segment elevation MI.  He was found to have severe multivessel CAD for which CABG revascularization surgery was recommended.  Preoperative carotid Doppler imaging showed mild 1 to 39% right ICA stenosis with left ICA velocities in the 40 to 59% range.  However, he ultimately refused the opportunity for bypass.  He states his father had a stroke during a bypass procedure and he does not want this to happen to him.  During his hospitalization and at subsequent office visits he was adamant that he did not want bypass surgery.  At subsequent evaluations he was having class III symptoms of angina.  He was having occasional PVCs.  At his last office visit in June 2021 his ECG showed  sinus bradycardia at 50 without ectopy on his slightly increase metoprolol succinate at 37.5 mg.  Since his last visit, he has been on a medical regimen now consisting of amlodipine 5 mg daily, isosorbide 60 mg in the morning, metoprolol succinate 37.5 mg daily, ramipril 10 mg, and ranolazine 500 mg twice a day.  He has continued to be on aspirin and Plavix.  Presently, he states he experiences daily chest pain.  He initially notes it in the morning when he wakes up and then has another episode of chest pain at approximately 4 PM.  He has used sublingual nitroglycerin daily and had run out of his nitroglycerin and called for renewal.  He presented to the office with ongoing chest pain.  His chest pain immediately resolved following a sublingual nitroglycerin tablet.  His ECG did not show any acute ischemic changes.  Presently, I am increasing his morning isosorbide from 60 mg to 90 mg and I am adding a 30 mg evening dose prior to bedtime.  In addition I have recommended further titration of Ranexa to 1000 mg twice a day.  His QTc interval is normal.  I discussed potential reconsideration of CABG revascularization surgery.  He  is now open to possible reconsideration in 2022 after the new year.  I will see him in follow-up in January and further recommendations will be made at that time.   [name], MD, [facility]  08/08/2020 10:28 AM

## 2020-08-03 NOTE — Patient Instructions (Addendum)
Medication Instructions:  Increase Ranexa 100 mg twice a day  Increase Isosorbide Mono ( Imdur ) 90 mg ( 1 and 1/2 tablet)  in the morning  and 30 mg  ( 1/2 tablet  )in the evening    *If you need a refill on your cardiac medications before your next appointment, please call your pharmacy*   Lab Work: Not needed If you have labs (blood work) drawn today and your tests are completely normal, you will receive your results only by: [name] MyChart Message (if you have MyChart) OR . A paper copy in the mail If you have any lab test that is abnormal or we need to change your treatment, we will call you to review the results.   Testing/Procedures: Not needed   Follow-Up: At [facility], you and your health needs are our priority.  As part of our continuing mission to provide you with exceptional heart care, we have created designated Provider Care Teams.  These Care Teams include your primary Cardiologist (physician) and Advanced Practice Providers (APPs -  Physician Assistants and Nurse Practitioners) who all work together to provide you with the care you need, when you need it.     Your next appointment:   2 month(s)  The format for your next appointment:   In Person  Provider:   Shelva Majestic, MD

## 2020-08-08 ENCOUNTER — Encounter: Payer: Self-pay | Admitting: Cardiovascular Disease

## 2020-08-12 ENCOUNTER — Telehealth: Payer: Self-pay

## 2020-08-12 NOTE — Telephone Encounter (Signed)
Did they give you a callback number?

## 2020-08-12 NOTE — Telephone Encounter (Signed)
Received call about a Repatha PA...  Ref # [id number]..  Thank you

## 2020-08-19 ENCOUNTER — Telehealth: Payer: Self-pay | Admitting: Internal Medicine

## 2020-08-19 NOTE — Telephone Encounter (Signed)
Attempted to call pt but line rang and rang and was not able to leave VM. Tried to call back but again line rang and rang and no machine ever kicked in. Will try to call pt back later.

## 2020-08-19 NOTE — Telephone Encounter (Signed)
Patient is returning phone call. Patient scheduled for 09/29/2020. Patient phone number is [phone].

## 2020-08-19 NOTE — Telephone Encounter (Signed)
ATC patient. LM for Patient to call back. Patient LOV was 06/08/18 with Dr. Melvyn Novas. Patient needs OV.

## 2020-08-25 MED ORDER — BUDESONIDE-FORMOTEROL FUMARATE 160-4.5 MCG/ACT IN AERO
2.0000 | INHALATION_SPRAY | Freq: Two times a day (BID) | RESPIRATORY_TRACT | 11 refills | Status: DC
Start: 2020-08-25 — End: 2021-03-11

## 2020-08-25 NOTE — Telephone Encounter (Signed)
Called and spoke with patient who states he needs refill of Symbicort. Patient is on Symbicort 160. Patient verified pharmacy. Advised I would send it in. Patient expressed understanding.  When I went to send in refill was advised that Symbicort is not on formulary for this patient. If patient calls and says there is an issue this looks like these are the inhalers that are covered:  Symbicort 80, Advair HFA, Breo 100 & 200, Dulera 100 & 200, Fluticasone- Salmeterol  Nothing further needed at this time.

## 2020-08-25 NOTE — Telephone Encounter (Signed)
Pt states that Jonathan [name] said she would call the Symbicort in before the OV that is scheduled in January -

## 2020-09-07 ENCOUNTER — Other Ambulatory Visit: Payer: Self-pay | Admitting: Cardiovascular Disease

## 2020-09-15 ENCOUNTER — Telehealth: Payer: Self-pay

## 2020-09-15 NOTE — Telephone Encounter (Signed)
Left message for pt to see if he would like to be seen by Dr. Tresa Endo tomorrow, 09/16/20.

## 2020-09-16 ENCOUNTER — Other Ambulatory Visit: Payer: Self-pay

## 2020-09-16 ENCOUNTER — Encounter (HOSPITAL_COMMUNITY): Payer: Self-pay

## 2020-09-16 ENCOUNTER — Emergency Department (HOSPITAL_COMMUNITY)
Admission: EM | Admit: 2020-09-16 | Discharge: 2020-09-16 | Disposition: A | Discharge status: Home / Self Care | Payer: Medicare Other | Attending: Emergency Medicine | Admitting: Emergency Medicine

## 2020-09-16 ENCOUNTER — Emergency Department (HOSPITAL_COMMUNITY): Payer: Medicare Other

## 2020-09-16 ENCOUNTER — Telehealth: Payer: Self-pay

## 2020-09-16 DIAGNOSIS — Z79899 Other long term (current) drug therapy: Secondary | ICD-10-CM | POA: Insufficient documentation

## 2020-09-16 DIAGNOSIS — F17210 Nicotine dependence, cigarettes, uncomplicated: Secondary | ICD-10-CM | POA: Insufficient documentation

## 2020-09-16 DIAGNOSIS — J449 Chronic obstructive pulmonary disease, unspecified: Secondary | ICD-10-CM | POA: Insufficient documentation

## 2020-09-16 DIAGNOSIS — Z7982 Long term (current) use of aspirin: Secondary | ICD-10-CM | POA: Insufficient documentation

## 2020-09-16 DIAGNOSIS — Z955 Presence of coronary angioplasty implant and graft: Secondary | ICD-10-CM | POA: Diagnosis not present

## 2020-09-16 DIAGNOSIS — I10 Essential (primary) hypertension: Secondary | ICD-10-CM | POA: Insufficient documentation

## 2020-09-16 DIAGNOSIS — Z789 Other specified health status: Secondary | ICD-10-CM

## 2020-09-16 DIAGNOSIS — I6523 Occlusion and stenosis of bilateral carotid arteries: Secondary | ICD-10-CM

## 2020-09-16 DIAGNOSIS — Z7951 Long term (current) use of inhaled steroids: Secondary | ICD-10-CM | POA: Insufficient documentation

## 2020-09-16 DIAGNOSIS — I25118 Atherosclerotic heart disease of native coronary artery with other forms of angina pectoris: Secondary | ICD-10-CM

## 2020-09-16 DIAGNOSIS — E785 Hyperlipidemia, unspecified: Secondary | ICD-10-CM

## 2020-09-16 DIAGNOSIS — I208 Other forms of angina pectoris: Secondary | ICD-10-CM

## 2020-09-16 DIAGNOSIS — I209 Angina pectoris, unspecified: Secondary | ICD-10-CM | POA: Insufficient documentation

## 2020-09-16 DIAGNOSIS — R079 Chest pain, unspecified: Secondary | ICD-10-CM | POA: Diagnosis present

## 2020-09-16 LAB — CBC
HCT: 46.2 % (ref 39.0–52.0)
Hemoglobin: 15.8 g/dL (ref 13.0–17.0)
MCH: 33.7 pg (ref 26.0–34.0)
MCHC: 34.2 g/dL (ref 30.0–36.0)
MCV: 98.5 fL (ref 80.0–100.0)
Platelets: 253 10*3/uL (ref 150–400)
RBC: 4.69 MIL/uL (ref 4.22–5.81)
RDW: 13.6 % (ref 11.5–15.5)
WBC: 8.9 10*3/uL (ref 4.0–10.5)
nRBC: 0 % (ref 0.0–0.2)

## 2020-09-16 LAB — BASIC METABOLIC PANEL
Anion gap: 13 (ref 5–15)
BUN: 11 mg/dL (ref 8–23)
CO2: 26 mmol/L (ref 22–32)
Calcium: 9.1 mg/dL (ref 8.9–10.3)
Chloride: 96 mmol/L — ABNORMAL LOW (ref 98–111)
Creatinine, Ser: 1.15 mg/dL (ref 0.61–1.24)
GFR, Estimated: >60 mL/min (ref >60)
Glucose, Bld: 90 mg/dL (ref 70–99)
Potassium: 5.1 mmol/L (ref 3.5–5.1)
Sodium: 135 mmol/L (ref 135–145)

## 2020-09-16 LAB — TROPONIN I (HIGH SENSITIVITY)
Troponin I (High Sensitivity): 9 ng/L (ref <18)
Troponin I (High Sensitivity): 10 ng/L (ref <18)

## 2020-09-16 IMAGING — CR DG CHEST 2V
2 series · 2 of 2 positions shown · non-contrast
Comparison: [DATE]

CLINICAL DATA: Chest pain for 1 year, cough, COPD

EXAM:
CHEST - 2 VIEW

[chest pa]
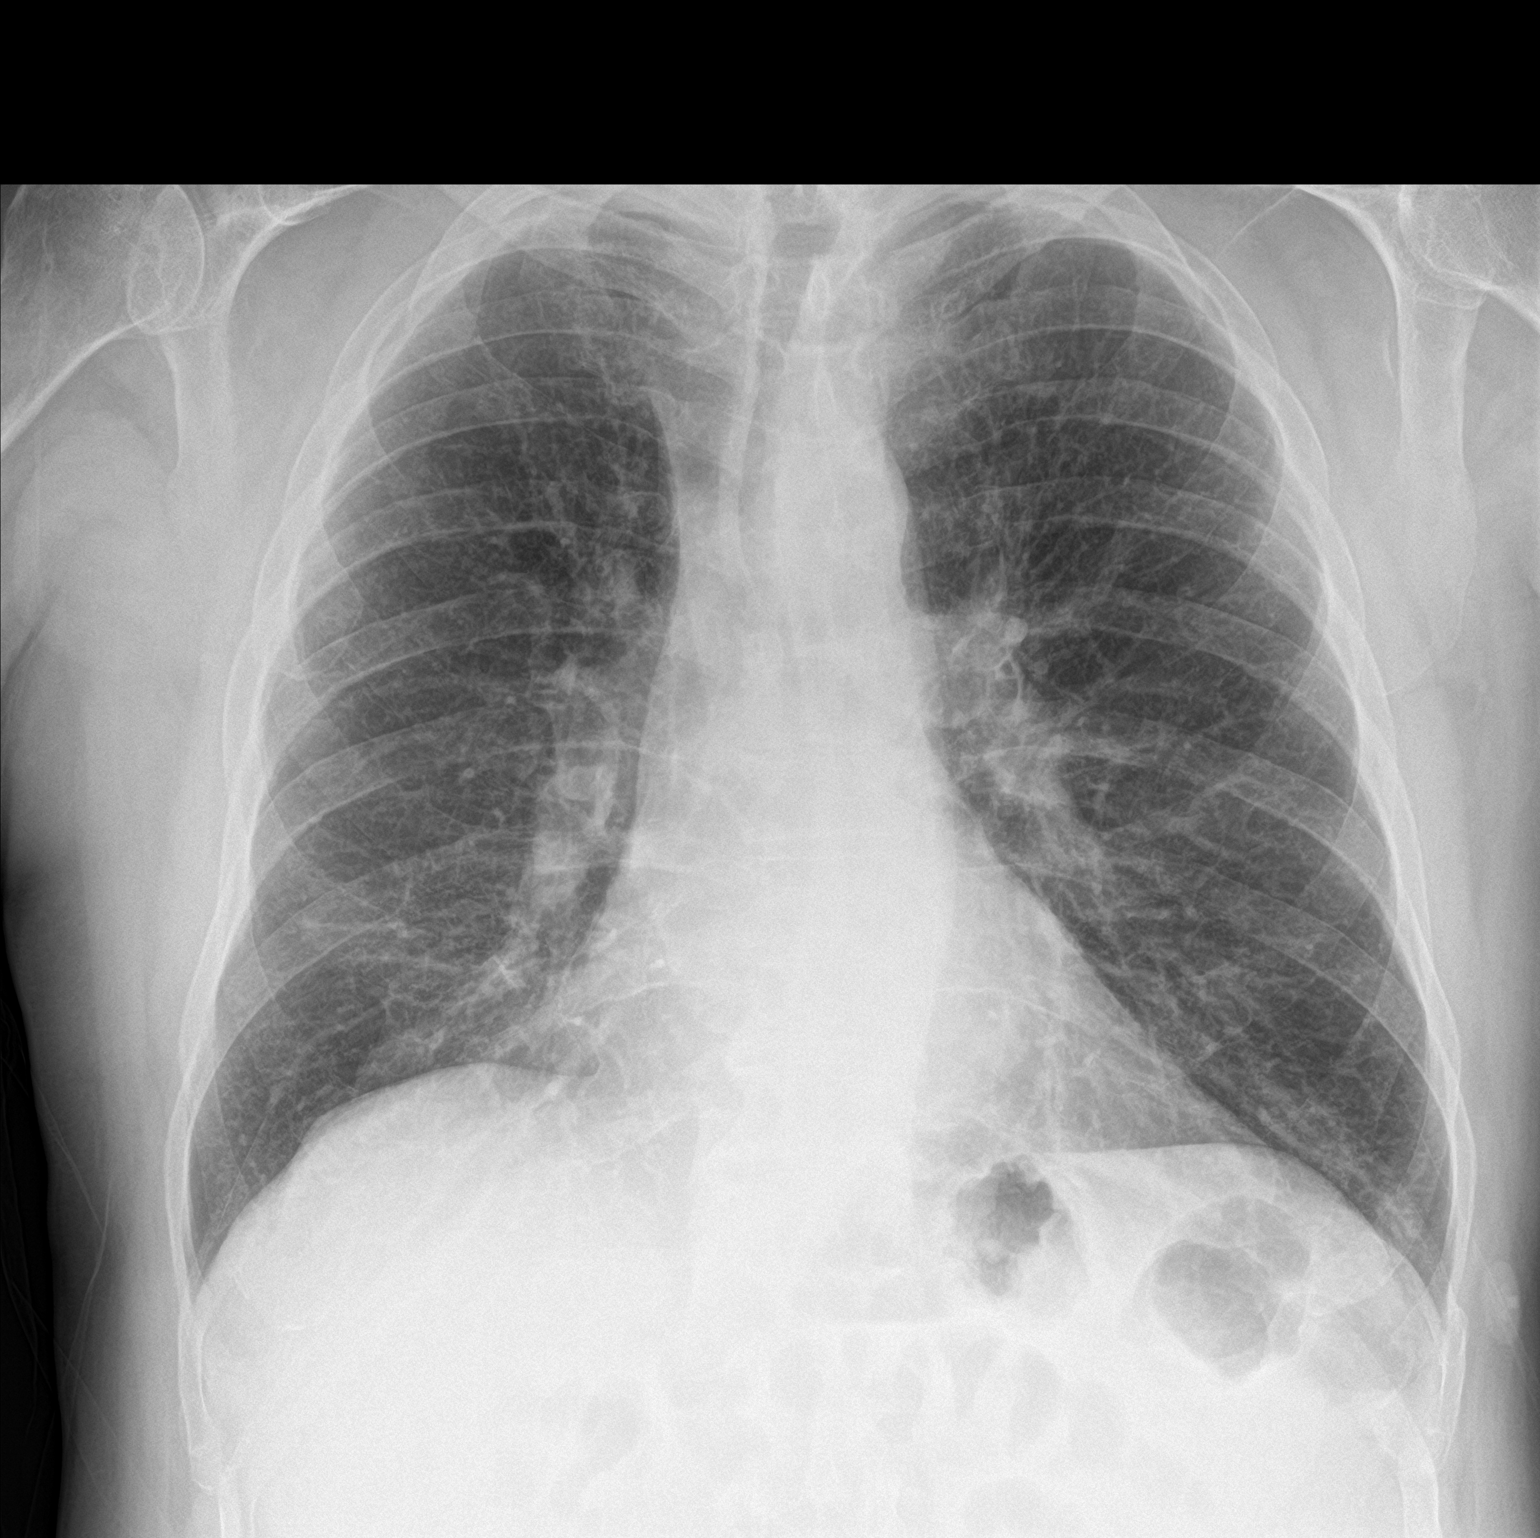

[chest lat]
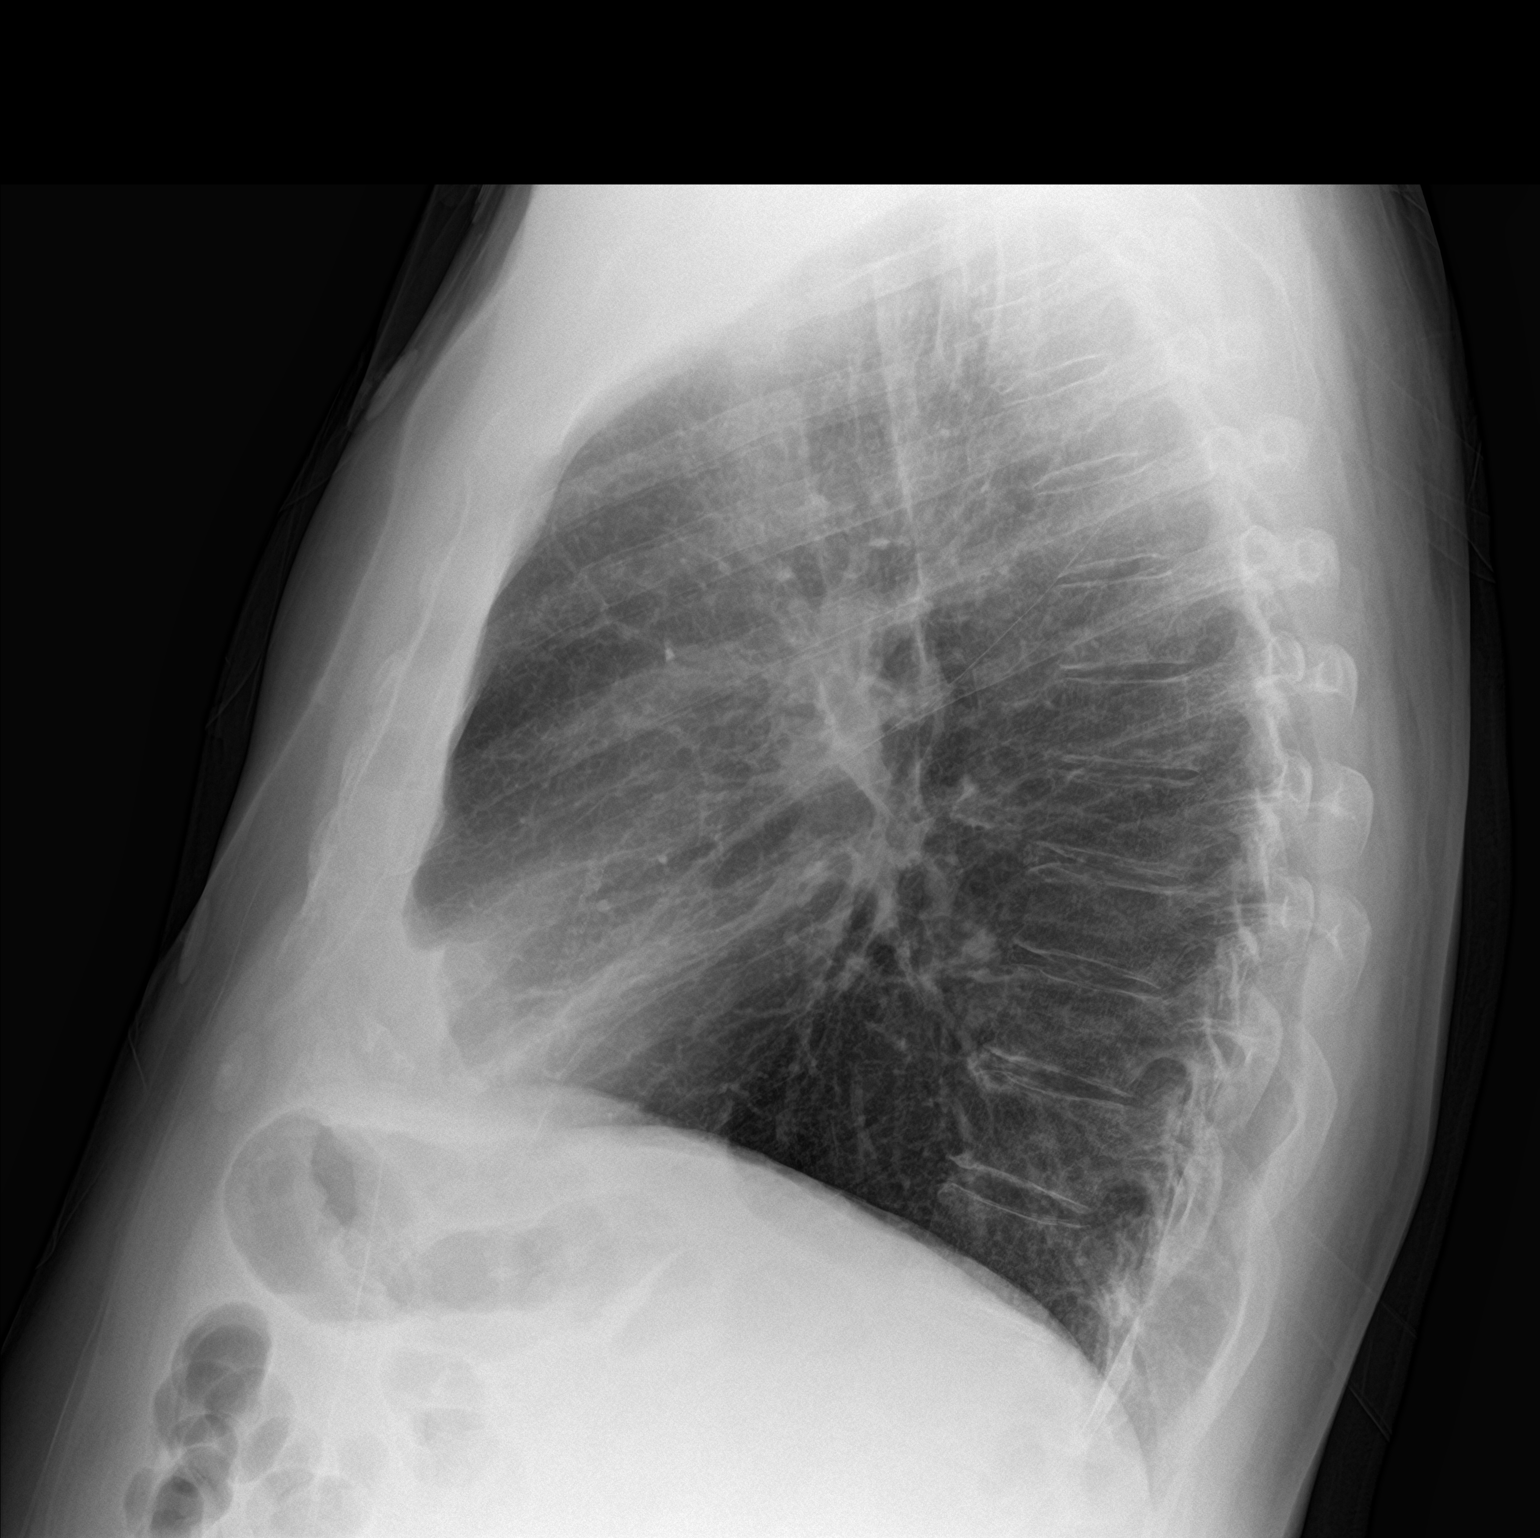

[2 of 2 positions shown; findings below may reference images not displayed]

FINDINGS: Normal heart size, mediastinal contours, and pulmonary vascularity.

Chronic transverse narrowing of the tracheal airway.

Chronic bronchitic and interstitial changes again identified.

Minimal subsegmental atelectasis at RIGHT base.

No acute infiltrate, pleural effusion, or pneumothorax.

Osseous structures unremarkable.
IMPRESSION: Chronic bronchitic and interstitial changes without acute
infiltrate.

Minimal RIGHT basilar atelectasis.

Transverse narrowing of the trachea, chronic; this can be seen from
multiple etiologies including tracheomalacia, COPD/saber sheath
trachea, and tracheal stenosis; consider further assessment of the
chest by CT.

## 2020-09-16 MED ORDER — AMLODIPINE BESYLATE 10 MG PO TABS: 10.0000 mg | ORAL_TABLET | Freq: Every day | ORAL | 2 refills | Status: DC

## 2020-09-16 NOTE — ED Provider Notes (Signed)
[facility] EMERGENCY DEPARTMENT Provider Note   CSN: [id number] Arrival date & time: 09/16/20  1024     History Chief Complaint  Patient presents with  . Chest Pain    Jonathan [name] is a 70 y.o. male.  HPI 71 year old male with an extensive cardiac history including multiple NSTEMI's, hypertension, hyperlipidemia, tobacco abuse, COPD, CAD, stable angina, hypertension followed by Dr. Claiborne Billings presents to the ER with complaints of exertional chest pain for several years, worsening over the last 3 months.  Per chart review, patient had a heart cath done in November 2020 which showed multivessel CAD with extensive stenosis.  Patient had been not amenable to CABG, however per note from Dr. Claiborne Billings in November 2021 patient appears to have been more amenable to a CABG.  He presents today stating that he is "ready for the procedure".  Per chart review, Dr. Evette Georges office called the patient yesterday to make an appointment for today, however the patient states that there was no actual availability and he told the office to "cancel all of his appointments and that he is coming to the ER".  There is no change in the characteristic of his chest pain that is been ongoing for last 3 months.  He states he is tired of taking nitroglycerin every time he has to walk or do any kind of physical activity. States he take nitro several times a day, with relief. Last dose was several hours ago.   States that he cannot lay down without having chest pain, per chart review, this appears to be unchanged.  He has no chest pain at rest.  No nausea, vomiting, diaphoresis, dizziness, syncope.  He has shortness of breath at baseline, has a history of COPD, states this is unchanged for him.  No cough, fevers. Past Medical History:  Diagnosis Date  . Arthritis    hands   . Chronic bronchitis ([place])   . Chronic sinusitis of both maxillary sinuses 04/16/2018   Sinus CT 04/25/2018 Mild mucoperiosteal thickening  involving the frontoethmoidal sinuses. Otherwise largely clear sinuses with no evidence for acute sinusitis.   [name] COPD GOLD II   active smoker 04/16/2018   Spirometry 04/16/2018  FEV1 1.43 (47%)  Ratio 62  s prior rx  - 04/16/2018    try symbicort 160 2bid - PFT's  06/08/2018  FEV1 2.03 (71 % ) ratio 61  p 5 % improvement from saba p nothing prior to study with DLCO  63 % corrects to 72  % for alv volume    -  06/08/2018  After extensive coaching inhaler device,  effectiveness =    75%  (short ti/ variable flow) > continue symbicort 160 2bid   . Coronary artery disease   . Coronary artery disease involving native coronary artery of native heart with unstable angina pectoris ([place])   . DOE (dyspnea on exertion) 04/16/2018  . Essential hypertension, benign 09/23/2012   Changed acei to arb 04/16/2018 due to pseudowheeze > improved 06/08/2018   . Hernia, inguinal 09/23/2012  . Hyperlipidemia    has not tolerated statins,  would not pursue PSK9 inhibitor as advised by Dr Claiborne Billings  . Hypertension   . Myocardial infarction ([place])   . NSTEMI (non-ST elevated myocardial infarction) ([place]) 09/23/2012  . 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 Tobacco abuse     Patient Active Problem List   Diagnosis Date Noted  . Coronary artery disease involving native coronary artery of native heart with unstable angina pectoris (  HCC)   . Cigarette smoker 04/17/2018  . DOE (dyspnea on exertion) 04/16/2018  . Chronic sinusitis of both maxillary sinuses 04/16/2018  . COPD GOLD II   active smoker 04/16/2018  . Obesity (BMI 30.0-34.9) 12/16/2015  . Tobacco abuse counseling 08/05/2014  . 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 Essential hypertension, benign 09/23/2012  . NSTEMI (non-ST elevated myocardial infarction) (HCC) 09/23/2012  . Hyperlipidemia 09/23/2012  . Hernia, inguinal 09/23/2012    Past Surgical History:  Procedure Laterality Date  . CARDIAC CATHETERIZATION    . CARDIOVASCULAR STRESS TEST  11/11/2011  . CORONARY  ANGIOPLASTY     stents- 01/2004   . heart stents  01/2004  . INGUINAL HERNIA REPAIR Right 12/06/2012   Procedure: HERNIA REPAIR INGUINAL ADULT;  Surgeon: Lodema Pilot, DO;  Location: [facility];  Service: General;  Laterality: Right;  . INSERTION OF MESH Right 12/06/2012   Procedure: INSERTION OF MESH;  Surgeon: Lodema Pilot, DO;  Location: [facility];  Service: General;  Laterality: Right;  . LEFT HEART CATH AND CORONARY ANGIOGRAPHY N/A 07/22/2019   Procedure: LEFT HEART CATH AND CORONARY ANGIOGRAPHY;  Surgeon: Lennette Bihari, MD;  [facility]: [facility];  Service: Cardiovascular;  Laterality: N/A;       Family History  Problem Relation Age of Onset  . CAD Father        CABG with subsequent stroke    Social History   Tobacco Use  . Smoking status: Current Every Day Smoker    Packs/day: 1.00    Years: 53.00    Pack years: 53.00    Types: Cigarettes  . Smokeless tobacco: Never Used  Vaping Use  . Vaping Use: Never used  Substance Use Topics  . Alcohol use: Yes    Alcohol/week: 5.0 standard drinks    Types: 5 Standard drinks or equivalent per week    Comment: consumes 1 pint per day   . Drug use: No    Home Medications Prior to Admission medications   Medication Sig Start Date End Date Taking? Authorizing Provider  albuterol (PROVENTIL HFA;VENTOLIN HFA) 108 (90 Base) MCG/ACT inhaler Inhale 2 puffs into the lungs every 4 (four) hours as needed for wheezing or shortness of breath (cough, shortness of breath or wheezing.). 10/24/18   Nyoka Cowden, MD  amLODipine (NORVASC) 10 MG tablet Take 1 tablet (10 mg total) by mouth daily. 09/16/20 12/15/20  Mare Ferrari, PA-C  aspirin EC 81 MG tablet Take 81 mg by mouth every morning.    [provider]  budesonide-formoterol (SYMBICORT) 160-4.5 MCG/ACT inhaler Inhale 2 puffs into the lungs 2 (two) times daily. 08/25/20   Nyoka Cowden, MD  clopidogrel (PLAVIX) 75 MG tablet TAKE 1 TABLET BY MOUTH  DAILY 05/27/20   Lennette Bihari,  MD  colchicine 0.6 MG tablet Take 0.6 mg by mouth 2 (two) times daily. 01/29/20   [provider]  Evolocumab (REPATHA SURECLICK) 140 MG/ML SOAJ Inject 140 mg into the skin every 14 (fourteen) days. 10/21/19   Lennette Bihari, MD  isosorbide mononitrate (IMDUR) 60 MG 24 hr tablet Take to 1 and 1/2 tablet ( 90 mg) in the morning and  1/2 tablet (30 mg ) at bedtime 08/03/20   Lennette Bihari, MD  metoprolol succinate (TOPROL-XL) 25 MG 24 hr tablet Take 1.5 tablets (37.5 mg total) by mouth daily. 06/23/20   Lennette Bihari, MD  nitroGLYCERIN (NITROSTAT) 0.4 MG SL tablet PLACE 1 TABLET UNDER THE TONGUE EVERY  5 MINUTES AS NEEDED FOR CHEST PAIN UP TO 3 DOSES, IF SYMPTOMS PERSIST CALL 911 09/07/20   [name], MD  ramipril (ALTACE) 10 MG capsule TAKE 1 CAPSULE BY MOUTH  DAILY 05/27/20   [name], MD  ranolazine (RANEXA) 1000 MG SR tablet Take 1 tablet (1,000 mg total) by mouth 2 (two) times daily. 08/03/20   [name], MD    Allergies    Niaspan [niacin er] and Statins  Review of Systems   Review of Systems  Constitutional: Negative for chills and fever.  HENT: Negative for ear pain and sore throat.   Eyes: Negative for pain and visual disturbance.  Respiratory: Positive for shortness of breath. Negative for cough.   Cardiovascular: Positive for chest pain. Negative for palpitations and leg swelling.  Gastrointestinal: Negative for abdominal pain and vomiting.  Genitourinary: Negative for dysuria and hematuria.  Musculoskeletal: Negative for arthralgias and back pain.  Skin: Negative for color change and rash.  Neurological: Negative for seizures and syncope.  All other systems reviewed and are negative.   Physical Exam Updated Vital Signs BP 121/74 (BP Location: Right Arm)   Pulse 66   Temp 98.3 F (36.8 C) (Oral)   Resp 19   Ht 5\' 8"  (1.727 m)   Wt 99.8 kg   SpO2 92%   BMI 33.45 kg/m   Physical Exam Vitals and nursing note reviewed.  Constitutional:       General: He is not in acute distress.    Appearance: He is well-developed and well-nourished. He is not ill-appearing, toxic-appearing or diaphoretic.     Comments: Ambulating around the room without difficulty   HENT:     Head: Normocephalic and atraumatic.  Eyes:     Conjunctiva/sclera: Conjunctivae normal.  Cardiovascular:     Rate and Rhythm: Normal rate and regular rhythm.     Heart sounds: No murmur heard.   Pulmonary:     Effort: Pulmonary effort is normal. Tachypnea present. No respiratory distress.     Breath sounds: Normal breath sounds.     Comments: Pursed lip breathing noted  Abdominal:     Palpations: Abdomen is soft.     Tenderness: There is no abdominal tenderness.  Musculoskeletal:        General: No edema.     Cervical back: Neck supple.     Right lower leg: No tenderness. No edema.     Left lower leg: No tenderness. No edema.  Skin:    General: Skin is warm and dry.  Neurological:     Mental Status: He is alert.  Psychiatric:        Mood and Affect: Mood and affect normal.     ED Results / Procedures / Treatments   Labs (all labs ordered are listed, but only abnormal results are displayed) Labs Reviewed  BASIC METABOLIC PANEL - Abnormal; Notable for the following components:      Result Value   Chloride 96 (*)    All other components within normal limits  CBC  TROPONIN I (HIGH SENSITIVITY)  TROPONIN I (HIGH SENSITIVITY)    EKG None  Radiology DG Chest 2 View  Result Date: 09/16/2020 CLINICAL DATA:  Chest pain for 1 year, cough, COPD EXAM: CHEST - 2 VIEW COMPARISON:  07/20/2019 FINDINGS: Normal heart size, mediastinal contours, and pulmonary vascularity. 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 Chronic bronchitic and interstitial changes again identified. Minimal subsegmental atelectasis at RIGHT base. No acute infiltrate, pleural effusion,  or pneumothorax. Osseous structures unremarkable. IMPRESSION: Chronic bronchitic and interstitial  changes without acute infiltrate. Minimal RIGHT basilar atelectasis. Transverse narrowing of the trachea, chronic; this can be seen from multiple etiologies including tracheomalacia, COPD/saber sheath trachea, and tracheal stenosis; consider further assessment of the chest by CT. Electronically Signed   By: Lavonia Dana M.D.   On: 09/16/2020 11:13    Procedures Procedures (including critical care time)  Medications Ordered in ED Medications - No data to display  ED Course  I have reviewed the triage vital signs and the nursing notes.  Pertinent labs & imaging results that were available during my care of the patient were reviewed by me and considered in my medical decision making (see chart for details).    MDM Rules/Calculators/A&P                          70 year old male with exertional chest pain x3 months On arrival, he is alert, oriented, nontoxic-appearing, no acute distress, ambulating around the room with no difficulty.  Vitals on arrival overall reassuring, no evidence of hypotension, tachycardia, hypoxia or fever.  Labs ordered in triage, personally reviewed and interpreted by me.  BMP and CBC largely unremarkable.  Initial troponin of 9.  Chest x-ray ordered in triage, reviewed and interpreted by myself and radiology.  Radiology comments on some transverse narrowing of the trachea which appears to be chronic, etiologies include tracheomalacia, COPD/saber-sheath trachea, tracheal stenosis.  Patient is not complaining of any worsening of shortness of breath from his baseline.  He was informed of these findings and encouraged to follow-up on this with his PCP.  EKG reviewed by myself my supervising physician Dr. Mardi Mainland.  No evidence of ischemia, no significant changes from prior.  Consulted Dr. Gwenlyn Found with [facility].  After discussing the case with him, Dr. Gwenlyn Found states that he will get the patient an appointment either late this week or early next week for evaluation.  He  recommends increasing his amlodipine to 10 mg.  Pending second troponin, patient will be likely stable for discharge.  No evidence of acute coronary syndrome, COPD exacerbation, PE, dissection, pneumonia/Covid.  Repeat troponin normal.  Stable for discharge w/ cardiology followup.  This was a shared visit with my supervising physician Dr. Gilford Raid who independently saw and evaluated the patient & provided guidance in evaluation/management/disposition ,in agreement with care  Final Clinical Impression(s) / ED Diagnoses Final diagnoses:  Stable angina [facility])    Rx / DC Orders ED Discharge Orders         Ordered    amLODipine (NORVASC) 10 MG tablet  Daily        09/16/20 [place], Jamail Cullers A, PA-C 09/16/20 1527    Isla Pence, MD 09/19/20 [phone]

## 2020-09-16 NOTE — ED Triage Notes (Signed)
Pt reports 1 year of chest pain, last visit with Dr Tresa Endo in Nov. States he is supposed to have heart surgery. Pt a.o, nad noted. Cough but reports hx of COPD.

## 2020-09-16 NOTE — Telephone Encounter (Signed)
Late entry:09/16/20 @ 1601  Attempted to call pt to make follow up appt per Dr. Allyson Sabal who saw the pt in the ED today while he was there for chest pain.  Pt called back at 1649. Discussed making a follow up appointment with one of our APPs. Pt is agreeable to this. Pt states that he is ready to move forward with discussion of bypass surgery.  Appointment made for pt on 09/28/20 @ 11:15am with Edd Fabian, NP.  Pt verbalizes understanding.

## 2020-09-16 NOTE — ED Notes (Signed)
Pt states he has been having cp on and off for a month. States since midnight he has taken maybe 10 nitro pills. He is not forsure. States the pain is worse when he moves.

## 2020-09-16 NOTE — Discharge Instructions (Addendum)
Please call Dr. Landry Dyke office, I have spoken with Dr. Allyson Sabal who is his partner and they should be getting you an appointment sometime later this week or early next week. Please take the amlodipine (Norvasc) 10mg  once daily.  Return to the ER for any new or worsening symptoms.

## 2020-09-28 ENCOUNTER — Ambulatory Visit: Payer: Medicare Other | Admitting: General Practice

## 2020-09-29 ENCOUNTER — Ambulatory Visit: Payer: Medicare Other | Admitting: Internal Medicine

## 2020-09-30 NOTE — Progress Notes (Incomplete)
Cardiology Office Note:    Date:  09/30/2020   ID:  Jonathan [name], DOB [birth year], MRN [id number]  PCP:  Leonard Downing, MD  Cardiologist:  Shelva Majestic, MD   Referring MD: Leonard Downing, *   No chief complaint on file. ***  History of Present Illness:    Jonathan [name] is a 70 y.o. male with a hx of ***  Past Medical History:  Diagnosis Date  . Arthritis    hands   . Chronic bronchitis ([place])   . Chronic sinusitis of both maxillary sinuses 04/16/2018   Sinus CT 04/25/2018 Mild mucoperiosteal thickening involving the frontoethmoidal sinuses. Otherwise largely clear sinuses with no evidence for acute sinusitis.   [name] COPD GOLD II   active smoker 04/16/2018   Spirometry 04/16/2018  FEV1 1.43 (47%)  Ratio 62  s prior rx  - 04/16/2018    try symbicort 160 2bid - PFT's  06/08/2018  FEV1 2.03 (71 % ) ratio 61  p 5 % improvement from saba p nothing prior to study with DLCO  63 % corrects to 72  % for alv volume    -  06/08/2018  After extensive coaching inhaler device,  effectiveness =    75%  (short ti/ variable flow) > continue symbicort 160 2bid   . Coronary artery disease   . Coronary artery disease involving native coronary artery of native heart with unstable angina pectoris ([place])   . DOE (dyspnea on exertion) 04/16/2018  . Essential hypertension, benign 09/23/2012   Changed acei to arb 04/16/2018 due to pseudowheeze > improved 06/08/2018   . Hernia, inguinal 09/23/2012  . Hyperlipidemia    has not tolerated statins,  would not pursue PSK9 inhibitor as advised by Dr Claiborne Billings  . Hypertension   . Myocardial infarction ([place])   . NSTEMI (non-ST elevated myocardial infarction) ([place]) 09/23/2012  . 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 Tobacco abuse     Past Surgical History:  Procedure Laterality Date  . CARDIAC CATHETERIZATION    . CARDIOVASCULAR STRESS TEST  11/11/2011  . CORONARY ANGIOPLASTY     stents- 01/2004   . heart stents  01/2004  . INGUINAL HERNIA REPAIR Right 12/06/2012    Procedure: HERNIA REPAIR INGUINAL ADULT;  Surgeon: [name], DO;  Location: [facility];  Service: General;  Laterality: Right;  . INSERTION OF MESH Right 12/06/2012   Procedure: INSERTION OF MESH;  Surgeon: [name], DO;  Location: [facility];  Service: General;  Laterality: Right;  . LEFT HEART CATH AND CORONARY ANGIOGRAPHY N/A 07/22/2019   Procedure: LEFT HEART CATH AND CORONARY ANGIOGRAPHY;  Surgeon: [name], MD;  Location: [facility] CV LAB;  Service: Cardiovascular;  Laterality: N/A;    Current Medications: No outpatient medications have been marked as taking for the 10/05/20 encounter (Appointment) with Ledora Bottcher, [place].     Allergies:   Niaspan [niacin er] and Statins   Social History   Socioeconomic History  . Marital status: Married    Spouse name: Not on file  . Number of children: Not on file  . Years of education: Not on file  . Highest education level: Not on file  Occupational History  . Not on file  Tobacco Use  . Smoking status: Current Every Day Smoker    Packs/day: 1.00    Years: 53.00    Pack years: 53.00    Types: Cigarettes  . Smokeless tobacco: Never Used  Vaping Use  . Vaping Use:  Never used  Substance and Sexual Activity  . Alcohol use: Yes    Alcohol/week: 5.0 standard drinks    Types: 5 Standard drinks or equivalent per week    Comment: consumes 1 pint per day   . Drug use: No  . Sexual activity: Yes  Other Topics Concern  . Not on file  Social History Narrative   Retired   Lives in [place]      Wishes to be DNR   Social Determinants of Radio broadcast assistant Strain: Not on [employer] Insecurity: Not on file  Transportation Needs: Not on file  Physical Activity: Not on file  Stress: Not on file  Social Connections: Not on file     Family History: The patient's ***family history includes CAD in his father.  ROS:   Please see the history of present illness.    *** All other systems reviewed and are  negative.  EKGs/Labs/Other Studies Reviewed:    The following studies were reviewed today: ***  EKG:  EKG is *** ordered today.  The ekg ordered today demonstrates ***  Recent Labs: 09/16/2020: BUN 11; Creatinine, Ser 1.15; Hemoglobin 15.8; Platelets 253; Potassium 5.1; Sodium 135  Recent Lipid Panel    Component Value Date/Time   CHOL 288 (H) 07/21/2019 0338   TRIG 79 07/21/2019 0338   HDL 76 07/21/2019 0338   CHOLHDL 3.8 07/21/2019 0338   VLDL 16 07/21/2019 0338   LDLCALC 196 (H) 07/21/2019 0338    Physical Exam:    VS:  There were no vitals taken for this visit.    Wt Readings from Last 3 Encounters:  09/16/20 220 lb (99.8 kg)  08/03/20 220 lb (99.8 kg)  02/12/20 213 lb (96.6 kg)     GEN: *** Well nourished, well developed in no acute distress HEENT: Normal NECK: No JVD; No carotid bruits LYMPHATICS: No lymphadenopathy CARDIAC: ***RRR, no murmurs, rubs, gallops RESPIRATORY:  Clear to auscultation without rales, wheezing or rhonchi  ABDOMEN: Soft, non-tender, non-distended MUSCULOSKELETAL:  No edema; No deformity  SKIN: Warm and dry NEUROLOGIC:  Alert and oriented x 3 PSYCHIATRIC:  Normal affect   ASSESSMENT:    No diagnosis found. PLAN:    In order of problems listed above:  No diagnosis found.   Medication Adjustments/Labs and Tests Ordered: Current medicines are reviewed at length with the patient today.  Concerns regarding medicines are outlined above.  No orders of the defined types were placed in this encounter.  No orders of the defined types were placed in this encounter.   Signed, Ledora Bottcher, PA  09/30/2020 5:08 PM    [facility]

## 2020-10-05 ENCOUNTER — Ambulatory Visit: Payer: Medicare Other | Admitting: Physician Assistant

## 2020-10-07 ENCOUNTER — Encounter: Payer: Self-pay | Admitting: Cardiovascular Disease

## 2020-10-07 ENCOUNTER — Other Ambulatory Visit: Payer: Self-pay

## 2020-10-07 ENCOUNTER — Ambulatory Visit: Payer: Medicare Other | Admitting: Cardiovascular Disease

## 2020-10-07 VITALS — BP 100/60 | HR 59 | Ht 68.0 in | Wt 222.4 lb

## 2020-10-07 DIAGNOSIS — I25118 Atherosclerotic heart disease of native coronary artery with other forms of angina pectoris: Secondary | ICD-10-CM

## 2020-10-07 DIAGNOSIS — I6523 Occlusion and stenosis of bilateral carotid arteries: Secondary | ICD-10-CM | POA: Diagnosis not present

## 2020-10-07 DIAGNOSIS — E785 Hyperlipidemia, unspecified: Secondary | ICD-10-CM | POA: Diagnosis not present

## 2020-10-07 DIAGNOSIS — I25110 Atherosclerotic heart disease of native coronary artery with unstable angina pectoris: Secondary | ICD-10-CM

## 2020-10-07 DIAGNOSIS — Z789 Other specified health status: Secondary | ICD-10-CM | POA: Diagnosis not present

## 2020-10-07 DIAGNOSIS — I209 Angina pectoris, unspecified: Secondary | ICD-10-CM

## 2020-10-07 DIAGNOSIS — I10 Essential (primary) hypertension: Secondary | ICD-10-CM

## 2020-10-07 DIAGNOSIS — Z720 Tobacco use: Secondary | ICD-10-CM

## 2020-10-07 DIAGNOSIS — Z01810 Encounter for preprocedural cardiovascular examination: Secondary | ICD-10-CM

## 2020-10-07 DIAGNOSIS — R079 Chest pain, unspecified: Secondary | ICD-10-CM

## 2020-10-07 MED ORDER — ISOSORBIDE MONONITRATE ER 60 MG PO TB24: ORAL_TABLET | ORAL | 6 refills | Status: DC

## 2020-10-07 MED ORDER — FUROSEMIDE 20 MG PO TABS: 20.0000 mg | ORAL_TABLET | Freq: Every day | ORAL | 6 refills | Status: DC | PRN

## 2020-10-07 MED ORDER — AMLODIPINE BESYLATE 5 MG PO TABS: 5.0000 mg | ORAL_TABLET | Freq: Every day | ORAL | 6 refills | Status: DC

## 2020-10-07 NOTE — Patient Instructions (Addendum)
Medication Instructions:  TAKE FUROSEMIDE 20MG  AS NEEDED FOR YOUR SWELLING ONLY ONCE A DAY.  DECREASE AMLODIPINE 5MG  DAILY  INCREASE ISOSORBIDE 120MG (2TAB) IN THE AM AND 60MG (1TAB) IN THE PM *If you need a refill on your cardiac medications before your next appointment, please call your pharmacy*  Lab Work:   Testing/Procedures:  CBC AND BMET TODAY SEE ATTACHED FOR CATH INSTRUCTIONS  Follow-Up: Your next appointment:  AFTER PROCEDURE(S) In Person with You may see Shelva Majestic, MD or one of the following Advanced Practice Providers on your designated Care Team:  Almyra Deforest, PA-C  Fabian Sharp, [place] or Roby Lofts, PA-C  At [facility], you and your health needs are our priority.  As part of our continuing mission to provide you with exceptional heart care, we have created designated Provider Care Teams.  These Care Teams include your primary Cardiologist (physician) and Advanced Practice Providers (APPs -  Physician Assistants and Nurse Practitioners) who all work together to provide you with the care you need, when you need it.      [address] [place] [address] Dept: [phone] Loc: [place]  10/07/2020  You are scheduled for a Cardiac Catheterization on Wednesday, February 2 with Dr. Shelva Majestic.  1. Please arrive at the [facility] (Main Entrance A) at [facility]: [address] [place], [place] [address] at 6:30 AM (This time is two hours before your procedure to ensure your preparation). Free valet parking service is available.  Special note: Every effort is made to have your procedure done on time. Please understand that emergencies sometimes delay scheduled procedures.  2. Diet: Do not eat solid foods after midnight.  The patient may have clear liquids until 5am upon the day of the procedure.  3. Labs: You will need to have blood drawn on  Wednesday, January 26 at [place]  Open: 8am - 5pm (Lunch 12:30 - 1:30)   Phone: [phone]. You do not need to be fasting.  TAKE YOUR LAST DOSE OF PLAVIX ON SATURDAY  10-10-2020  COVID testing site: YOUR COVID TEST IS SCHEDULED 10-10-2020 @11 :15AM. [address]. [place],  [address] Drive thru hours M-F 8-3:30PM  Sat 9-12:30PM.  On the morning of your procedure, take your ASPRIN and your morning medicines.  You may use sips of water.  5. Plan for one night stay--bring personal belongings. 6. Bring a current list of your medications and current insurance cards. 7. You MUST have a responsible person to drive you home. 8. Someone MUST be with you the first 24 hours after you arrive home or your discharge will be delayed. 9. Please wear clothes that are easy to get on and off and wear slip-on shoes.  Thank you for allowing [place] to care for you!   -- [place] [facility]

## 2020-10-07 NOTE — H&P (View-Only) (Signed)
Patient ID: Jonathan [name], male   DOB: [birth year], 70 y.o.   MRN: [id number]    Primary M.D.: Dr. Claris Gower  HPI: Jonathan [name] is a 70 y.o. male who presents to the office today for a follow-up cardiology evaluation after his recent emergency room 09/16/2020 evaluation.   Jonathan [name] has a history of coronary artery disease and on 01/11/2004 suffered an inferior wall myocardial infarction. Cardiac catheterization revealed total occlusion of the RCA and underwent successful intervention with ultimate insertion of a 3.0x33 mm and 3.0x13 mm drug-eluting Cipher stent with restoration of TIMI-3 flow. Additional problems include hyperlipidemia and ongoing tobacco use.  He admits to smoking at least one pack of cigarettes per day. He has a history of hypertension and has been on ramipril 10 mg in addition to Toprol-XL 25 mg.  He's been maintained on dual antiplatelet therapy with aspirin and Plavix.  He also takes Advair Diskus for his lung disease.  He has had intolerance to statins secondary to increased LFTs as well as CPK elevation. He had been on WelChol, fenofibrate, Zetia but apparently has not taking any lipid lowering therapy presently. His last nuclear perfusion study in March 2013 was unchanged from prior studies and did not show any evidence for scar or ischemia with only minimal residual basilar inferior thinning.  There was significant myocardial salvage from his successful reperfusion in the setting of his MI.Ejection fraction was 57%.  He sees Dr. Claris Gower, for primary care.  Laboratory by Dr. Arelia Sneddon in 07/15/2014 showed normal renal function with a BUN of 11,Cr0.98.  His lipids were markedly abnormal with a total cholesterol of 270, triglycerides of 368, VLDL of 74, and LDL cholesterol of 152.  HDL cholesterol was 44. Hemoglobin C was 5.6.  When I saw him in April 2018, he denied any episodes of chest pain or palpitations.  Unfortunately, he was continuing to smoke and had started  smoking at age 89.  He has COPD.  I last saw him in the office in January 2019.  Unfortunately he continues to smoke.  He has no interest in quitting.  He has been tried on multiple lipid-lowering agents develop increased LFTs and CPK elevation on statins.  In addition, I've tried WelChol, fenofibrate, and Zetia.  I last saw him, I discussed the possibility of Repatha.  He had undergone repeat laboratory in July 2018, which continue to show a total cholesterol 238, triglycerides 308, HDL 47, VLDL 62, and LDL cholesterol at 129.    Jonathan [name] was admitted to [facility] July 30, 2019 with a non-ST segment elevation MI.  At that time, I performed repeat cardiac catheterization after he consented to changes DNR status for the catheterization.  This revealed significant multivessel CAD with 90% diffuse proximal stenosis in the first diagonal branch of the LAD with 70% LAD stenosis beyond the diagonal takeoff.  There was 95% ostial left circumflex stenosis the vessel immediately bifurcating into the AV groove and circumflex.  The RCA had 40% the previously placed proximal stent with focal 80% mid distal in-stent restenosis and new 70% stenosis beyond the stented segment proximal to the acute margin large dominant RCA.  An echo Doppler study revealed EF of 55 to 60% without LVH.  ``Following the catheterization we discussed consideration for possible CABG revascularization surgery and obtain surgical consultation.  Apparently, the patient refused to consider CABG revascularization ultimately was subsequently discharged.  He has  been seen by Roby Lofts, PA-C in the office on  August 02, 2019.  He again stated his desire not to have surgery.  I saw him for his post hospital evaluation with me on November 05, 2019.  At that time he admitted to experiencing almost daily episodes of angina for several months.  He was not very active.  He also admits to exertional dyspnea.  He  reduced his 1 pack/day tobacco  habit down to 1/2 pack/day.  He has been smoking for over 60 years.  During his hospitalization, he had marked hyperlipidemia with total cholesterol 288, LDL cholesterol 196.  He was ultimately approved for Repatha and received his first PCSK9 inhibitor injection on October 28, 2019.  During that evaluation, I recommended the further titration of isosorbide mononitrate to 60 mg and increase Toprol-XL to 37.5 mg from his present dose of 25 mg.  I also recommended the addition of amlodipine 2.5 mg for the first week and then as long as his blood pressure is doing well he could further increase this to 5 mg.  He did not feel the 2.5 mg dose was effective, never titrated it and ultimately stopped taking it.    I  saw him in June 2021 at which time he was continuing to experience frequent anginal symptoms with activity.  Unfortunately he was still smoking half pack of cigarettes per day.  He had received 8 Repatha injections over the previous several months.  During that evaluation, I again discussed potential future reconsideration of CABG revascularization.  During that evaluation, his blood pressure was stable but on the low side.  He had never titrated amlodipine from 2.5 mg and felt it was not necessary so therefore stopped taking it.  During that evaluation I recommended initiation of ranolazine at 500 mg twice a day with further plans to titrate.  When I last saw him on 08/03/2020 he walked into the office today complaining of ongoing chest pain. Immediately upon coming into the room, he was placed on a stretcher but states he could not lie flat so therefore he was somewhat upright.  He was given sublingual nitroglycerin.  His chest pain promptly relieved.  ECG was taken which did not reveal any acute ischemic changes.  He states he typically experiences chest pain when he wakes up and then typically has another episode of chest pain approximately 4 PM in the afternoon and this occurs almost on a daily basis.   He has been on a regimen of amlodipine which I had titrated to 5 mg at his last visit, aspirin/Plavix, isosorbide 60 mg in the morning, metoprolol succinate 37.5 mg daily, ramipril 10 mg, and Ranexa 500 mg twice a day.  He continues to receive Repatha injections 140 mg every 2 weeks.  During that evaluation, I recommended further titration of his anti-ischemic medications and increased his isosorbide to 90 mg in the morning and recommended a 30 mg at night and increases Ranexa to 1000 mg twice a day.  That evaluation I again discussed reconsideration for CABG revascularization.  He left the office stating that he will contemplate this and may want to pursue this after the new year.  His chest pain did improve with increased medical therapy but he continued to experience recurrent chest pain symptomatology with very frequent sublingual nitroglycerin use.  He presented to the emergency room on 09/17/2019.  His ECG was stable.  His amlodipine dose was increased to 10 mg.  On his increased dose he has noticed lower extremity and ankle swelling.  He continues to have  daily early morning chest pain and he takes approximately 10-15 sublingual nitroglycerin per week.  He presents for reevaluation and is now interested in pursuing CABG revascularization surgery.  Past Medical History:  Diagnosis Date  . Arthritis    hands   . Chronic bronchitis (HCC)   . Chronic sinusitis of both maxillary sinuses 04/16/2018   Sinus CT 04/25/2018 Mild mucoperiosteal thickening involving the frontoethmoidal sinuses. Otherwise largely clear sinuses with no evidence for acute sinusitis.   . COPD GOLD II   active smoker 04/16/2018   Spirometry 04/16/2018  FEV1 1.43 (47%)  Ratio 62  s prior rx  - 04/16/2018    try symbicort 160 2bid - PFT's  06/08/2018  FEV1 2.03 (71 % ) ratio 61  p 5 % improvement from saba p nothing prior to study with DLCO  63 % corrects to 72  % for alv volume    -  06/08/2018  After extensive coaching inhaler device,   effectiveness =    75%  (short ti/ variable flow) > continue symbicort 160 2bid   . Coronary artery disease   . Coronary artery disease involving native coronary artery of native heart with unstable angina pectoris (HCC)   . DOE (dyspnea on exertion) 04/16/2018  . Essential hypertension, benign 09/23/2012   Changed acei to arb 04/16/2018 due to pseudowheeze > improved 06/08/2018   . Hernia, inguinal 09/23/2012  . Hyperlipidemia    has not tolerated statins,  would not pursue PSK9 inhibitor as advised by Dr Josefa Syracuse  . Hypertension   . Myocardial infarction (HCC)   . NSTEMI (non-ST elevated myocardial infarction) (HCC) 09/23/2012  . 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 Tobacco abuse     Past Surgical History:  Procedure Laterality Date  . CARDIAC CATHETERIZATION    . CARDIOVASCULAR STRESS TEST  11/11/2011  . CORONARY ANGIOPLASTY     stents- 01/2004   . heart stents  01/2004  . INGUINAL HERNIA REPAIR Right 12/06/2012   Procedure: HERNIA REPAIR INGUINAL ADULT;  Surgeon: Brian Layton, DO;  Location: [facility];  Service: General;  Laterality: Right;  . INSERTION OF MESH Right 12/06/2012   Procedure: INSERTION OF MESH;  Surgeon: Brian Layton, DO;  Location: [facility];  Service: General;  Laterality: Right;  . LEFT HEART CATH AND CORONARY ANGIOGRAPHY N/A 07/22/2019   Procedure: LEFT HEART CATH AND CORONARY ANGIOGRAPHY;  Surgeon: Geron Mulford A, MD;  Location: [facility] INVASIVE CV [facility];  Service: Cardiovascular;  Laterality: N/A;    Allergies  Allergen Reactions  . Niaspan [Niacin Er] Itching and Other (See Comments)    insomnia  . Statins     LFT elevavation, CK elevevation    Current Outpatient Medications  Medication Sig Dispense Refill  . albuterol (PROVENTIL HFA;VENTOLIN HFA) 108 (90 Base) MCG/ACT inhaler Inhale 2 puffs into the lungs every 4 (four) hours as needed for wheezing or shortness of breath (cough, shortness of breath or wheezing.). 1 Inhaler 5  . aspirin EC 81 MG tablet Take 81 mg by mouth  every morning.    . budesonide-formoterol (SYMBICORT) 160-4.5 MCG/ACT inhaler Inhale 2 puffs into the lungs 2 (two) times daily. (Patient taking differently: Inhale 2 puffs into the lungs 2 (two) times daily as needed (asthma).) 1 each 11  . clopidogrel (PLAVIX) 75 MG tablet TAKE 1 TABLET BY MOUTH  DAILY (Patient taking differently: Take 75 mg by mouth daily.) 90 tablet 3  . furosemide (LASIX) 20 MG tablet Take 1 tablet (20 mg total) by mouth   daily as needed (SWELLING). 30 tablet 6  . metoprolol succinate (TOPROL-XL) 25 MG 24 hr tablet Take 1.5 tablets (37.5 mg total) by mouth daily. 135 tablet 1  . nitroGLYCERIN (NITROSTAT) 0.4 MG SL tablet PLACE 1 TABLET UNDER THE TONGUE EVERY 5 MINUTES AS NEEDED FOR CHEST PAIN UP TO 3 DOSES, IF SYMPTOMS PERSIST CALL 911 (Patient taking differently: Place 0.4 mg under the tongue every 5 (five) minutes as needed for chest pain.) 25 tablet 6  . ramipril (ALTACE) 10 MG capsule TAKE 1 CAPSULE BY MOUTH  DAILY (Patient taking differently: Take 10 mg by mouth daily.) 90 capsule 3  . ranolazine (RANEXA) 1000 MG SR tablet Take 1 tablet (1,000 mg total) by mouth 2 (two) times daily. 60 tablet 11  . amLODipine (NORVASC) 5 MG tablet Take 1 tablet (5 mg total) by mouth daily. 30 tablet 6  . colchicine 0.6 MG tablet Take 0.6 mg by mouth 2 (two) times daily as needed (gout).    . Evolocumab (REPATHA SURECLICK) 093 MG/ML SOAJ Inject 140 mg into the skin every 14 (fourteen) days. (Patient not taking: No sig reported) 2 pen 11  . isosorbide mononitrate (IMDUR) 60 MG 24 hr tablet Take to 2 tablet ( 120 mg) in the morning and  1 tablet (60 mg ) at bedtime (Patient taking differently: Take 60-120 mg by mouth See admin instructions. Take to 2 tablet ( 120 mg) in the morning and  1 tablet (60 mg ) at bedtime) 90 tablet 6   No current facility-administered medications for this visit.    Social History   Socioeconomic History  . Marital status: Married    Spouse name: Not on file  .  Number of children: Not on file  . Years of education: Not on file  . Highest education level: Not on file  Occupational History  . Not on file  Tobacco Use  . Smoking status: Current Every Day Smoker    Packs/day: 1.00    Years: 53.00    Pack years: 53.00    Types: Cigarettes  . Smokeless tobacco: Never Used  Vaping Use  . Vaping Use: Never used  Substance and Sexual Activity  . Alcohol use: Yes    Alcohol/week: 5.0 standard drinks    Types: 5 Standard drinks or equivalent per week    Comment: consumes 1 pint per day   . Drug use: No  . Sexual activity: Yes  Other Topics Concern  . 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 Has one grandchild. He works in Education officer, museum and remains busy. He still smoking. He does use occasional alcohol.  Family history is notable that his father died with heart disease at age 51.  ROS General: Negative; No fevers, chills, or night sweats;  HEENT: Negative; No changes in vision or hearing, sinus congestion, difficulty swallowing Pulmonary:  Mild shortness of breath, no hemoptysis Cardiovascular: See HPI GI: Negative; No nausea, vomiting, diarrhea, or abdominal pain GU: Negative; No dysuria, hematuria, or difficulty voiding Musculoskeletal: Negative; no  myalgias, joint pain, or weakness Hematologic/Oncology: Negative; no easy bruising, bleeding Endocrine: Negative; no heat/cold intolerance; no diabetes Neuro: Negative; no changes in balance, headaches Skin: Negative; No rashes or skin lesions Psychiatric: Negative; No behavioral  problems, depression Sleep: Negative; No snoring, daytime sleepiness, hypersomnolence, bruxism, restless legs, hypnogognic hallucinations, no cataplexy Other comprehensive 14 point system review is negative.   PE BP 100/60 (BP Location: Left Arm, Patient Position: Sitting)   Pulse (!) 59   Ht 5' 8" (1.727 m)   Wt 222 lb 6.4 oz (100.9 kg)   SpO2 93%   BMI 33.82 kg/m    Blood pressure by me was 108/64..  Wt Readings from Last 3 Encounters:  10/07/20 222 lb 6.4 oz (100.9 kg)  09/16/20 220 lb (99.8 kg)  08/03/20 220 lb (99.8 kg)    BP 100/60 (BP Location: Left Arm, Patient Position: Sitting)   Pulse (!) 59   Ht 5' 8" (1.727 m)   Wt 222 lb 6.4 oz (100.9 kg)   SpO2 93%   BMI 33.82 kg/m  General: Alert, oriented, no distress.  Skin: normal turgor, no rashes, warm and dry HEENT: Normocephalic, atraumatic. Pupils equal round and reactive to light; sclera anicteric; extraocular muscles intact;  Nose without nasal septal hypertrophy Mouth/Parynx benign; Mallinpatti scale 3 Neck: No JVD, no carotid bruits; normal carotid upstroke Lungs: clear to ausculatation and percussion; no wheezing or rales Chest wall: without tenderness to palpitation Heart: PMI not displaced, RRR, s1 s2 normal, 1/6 systolic murmur, no diastolic murmur, no rubs, gallops, thrills, or heaves Abdomen: soft, nontender; no hepatosplenomehaly, BS+; abdominal aorta nontender and not dilated by palpation. Back: no CVA tenderness Pulses 2+ Musculoskeletal: full range of motion, normal strength, no joint deformities Extremities: 2+ ankle edema 1+ pretibial; no clubbing cyanosis or edema, Homan's sign negative  Neurologic: grossly nonfocal; Cranial nerves grossly wnl Psychologic: Normal mood and affect   ECG (independently read by me): Sinus Bradycardia at 59; No significant ST changes  August 03, 2020 ECG (independently read by me): Normal sinus rhythm at 63 bpm.  Nonspecific ST changes.  Normal intervals.  No  ectopy  February 12, 2020 ECG (independently read by me): Sinus bradycardia at 50 bpm.  PR interval 192 ms.  QTc interval 417 ms.  No significant ST-T changes.  No ectopy.  November 05, 2019 ECG (independently read by me): Sinus rhythm with occasional to frequent PVCs with VA retrograde P waves  January 2019 ECG (independently read by me): Sinus bradycardia 59 bpm.  No ectopy.  Normal intervals.  April 2018 ECG (independently read by me): Normal sinus rhythm at 61 bpm.  No ectopy.  Normal intervals.  April 2017 ECG (independently read by me): Normal sinus rhythm with an occasional PVC with retrograde P wave.  November 2015 ECG (independently read by me): Sinus bradycardia 52 bpm  Prior ECG: Sinus rhythm at 61 beats per minute. No ectopy. Normal intervals.  LABS:  BMP Latest Ref Rng & Units 10/07/2020 09/16/2020 07/25/2019  Glucose 65 - 99 mg/dL 70 90 92  BUN 8 - 27 mg/dL _0 Creatinine 0.76 - 1.27 mg/dL 1.07 1.15 1.19  BUN/Creat Ratio 10 - 24 9(L) - -  Sodium 134 - 144 mmol/L 138 135 139  Potassium 3.5 - 5.2 mmol/L 4.3 5.1 3.9  Chloride 96 - 106 mmol/L 94(L) 96(L) 104  CO2 20 - 29 mmol/L _1 Calcium 8.6 - 10.2 mg/dL 9.2 9.1 8.8(L)   Hepatic Function Latest Ref  Rng & Units 07/20/2019  Total Protein 6.5 - 8.1 g/dL 7.6  Albumin 3.5 - 5.0 g/dL 4.1  AST 15 - 41 U/L 32  ALT 0 - 44 U/L 18  Alk Phosphatase 38 - 126 U/L 52  Total Bilirubin 0.3 - 1.2 mg/dL 0.8  Bilirubin, Direct 0.0 - 0.2 mg/dL 0.2   CBC Latest Ref Rng & Units 10/07/2020 09/16/2020 07/25/2019  WBC 3.4 - 10.8 x10E3/uL 8.8 8.9 8.6  Hemoglobin 13.0 - 17.7 g/dL 15.5 15.8 14.0  Hematocrit 37.5 - 51.0 % 45.7 46.2 41.8  Platelets 150 - 450 x10E3/uL 262 253 197    Lab Results  Component Value Date   MCV 97 10/07/2020   MCV 98.5 09/16/2020   MCV 97.9 07/25/2019   No results found for: TSH   Lipid Panel     Component Value Date/Time   CHOL 288 (H) 07/21/2019 0338   TRIG 79 07/21/2019 0338   HDL 76 07/21/2019  0338   CHOLHDL 3.8 07/21/2019 0338   VLDL 16 07/21/2019 0338   LDLCALC 196 (H) 07/21/2019 0338    RADIOLOGY: No results found.  IMPRESSION:  1. Coronary artery disease involving native coronary artery of native heart with unstable angina pectoris ([place])   2. Hyperlipidemia with target LDL less than 70   3. Statin intolerance   4. Bilateral carotid artery stenosis   5. Angina pectoris ([place]): NYHA class 4   6. Preop cardiovascular exam   7. Tobacco abuse      ASSESSMENT AND PLAN: Jonathan [name] is a 38 -year-old gentleman who is 16 1/2 years since his inferior wall myocardial infarction and emergent catheterization in May 2005 at which time he underwent successful intervention to a totally occluded mid RCA with insertion of tandem drug-eluting Cypher stents.  He had concomitant CAD involving his LAD and diagonal vessel. He developed CPK and LFT elevation with statin therapy also stopped taking all of her lipid-lowering treatment.  He  continued to smoke cigarettes.  He was hospitalized in November 2020 when he presented with non-ST segment elevation MI.  He was found to have severe multivessel CAD for which CABG revascularization surgery was recommended.  He was seen by Dr. Arvid Right. Preoperative carotid Doppler imaging showed mild 1 to 39% right ICA stenosis with left ICA velocities in the 40 to 59% range.  However, he ultimately refused the opportunity for bypass.  He states his father had a stroke during a bypass procedure and he does not want this to happen to him.  During his hospitalization and at subsequent office visits he was adamant that he did not want bypass surgery.  At subsequent evaluations he was having class III symptoms of angina.  He was having occasional PVCs.  At his  office visit in June 2021 his ECG showed  sinus bradycardia at 50 without ectopy on his slightly increase metoprolol succinate at 37.5 mg.  When I last saw him in November 2021 he was on a medical regimen   consisting of amlodipine 5 mg daily, isosorbide 60 mg in the morning, metoprolol succinate 37.5 mg daily, ramipril 10 mg, and ranolazine 500 mg twice a day.  He has continued to be on aspirin and Plavix.  Was having daily chest pain and typically was waking up in the morning with chest pain.  At that time I titrated his isosorbide to 90 mg in the morning and added a 30 mg evening dose and recommended further titration of Ranexa to 1000 mg twice  a day.  This did improve his symptomatology but he continued to experience recurrent chest pain.  He was recently reevaluated in the emergency room on 09/16/2020 and amlodipine dose was increased to 10 mg.  This has resulted in increasing leg swelling.  He has continued to have daily sublingual nitroglycerin use with chest pain every morning and takes approximately 10-15 sublingual nitroglycerin per week.  He is now tired of taking so much sublingual nitroglycerin and has now agreed to undergo CABG revascularization surgery.  With his significant leg swelling I am reducing amlodipine back down to 5 mg and will increase isosorbide to 120 mg in the morning and 60 mg at night.  I also have given him a prescription for Lasix 20 mg to take as needed depending upon his leg swelling.  His last catheterization was in November 2020 he will need a reevaluation prior to CABG revascularization.  We will schedule him to undergo repeat cardiac catheterization next week on February 2 and he was advised to hold his Plavix from January 29.  We will try to arrange for Dr. Cyndia Bent to see following his catheterization on Wednesday, February 2 and that hopefully CABG surgery can be performed the following day if schedule allows since he will have had a Plavix washout.The risks and benefits of a cardiac catheterization including, but not limited to, death, stroke, MI, kidney damage and bleeding were discussed with the patient who indicates understanding and agrees to proceed.  He continues to be on  Repatha for PCSK9 inhibition in light of his statin intolerance.   Jonathan Sine, MD, [facility]  10/08/2020 4:58 PM

## 2020-10-07 NOTE — Progress Notes (Unsigned)
Patient ID: Jonathan [name], male   DOB: Jan 10, 1951, 70 y.o.   MRN: [id number]    Primary M.D.: Dr. Claris [name]  HPI: Jonathan [name] is a 70 y.o. male who presents to the office today for a follow-up cardiology evaluation after his recent emergency room 09/16/2020 evaluation.   [name] has a history of coronary artery disease and on 01/11/2004 suffered an inferior wall myocardial infarction. Cardiac catheterization revealed total occlusion of the RCA and underwent successful intervention with ultimate insertion of a 3.0x33 mm and 3.0x13 mm drug-eluting Cipher stent with restoration of TIMI-3 flow. Additional problems include hyperlipidemia and ongoing tobacco use.  He admits to smoking at least one pack of cigarettes per day. He has a history of hypertension and has been on ramipril 10 mg in addition to Toprol-XL 25 mg.  He's been maintained on dual antiplatelet therapy with aspirin and Plavix.  He also takes Advair Diskus for his lung disease.  He has had intolerance to statins secondary to increased LFTs as well as CPK elevation. He had been on WelChol, fenofibrate, Zetia but apparently has not taking any lipid lowering therapy presently. His last nuclear perfusion study in March 2013 was unchanged from prior studies and did not show any evidence for scar or ischemia with only minimal residual basilar inferior thinning.  There was significant myocardial salvage from his successful reperfusion in the setting of his MI.Ejection fraction was 57%.  He sees Dr. Claris [name], for primary care.  Laboratory by Dr. Arelia [name] in 07/15/2014 showed normal renal function with a BUN of 11,Cr0.98.  His lipids were markedly abnormal with a total cholesterol of 270, triglycerides of 368, VLDL of 74, and LDL cholesterol of 152.  HDL cholesterol was 44. Hemoglobin C was 5.6.  When I saw him in April 2018, he denied any episodes of chest pain or palpitations.  Unfortunately, he was continuing to smoke and had started  smoking at age 18.  He has COPD.  I last saw him in the office in January 2019.  Unfortunately he continues to smoke.  He has no interest in quitting.  He has been tried on multiple lipid-lowering agents develop increased LFTs and CPK elevation on statins.  In addition, I've tried WelChol, fenofibrate, and Zetia.  I last saw him, I discussed the possibility of Repatha.  He had undergone repeat laboratory in July 2018, which continue to show a total cholesterol 238, triglycerides 308, HDL 47, VLDL 62, and LDL cholesterol at 129.    [name] was admitted to [facility] July 30, 2019 with a non-ST segment elevation MI.  At that time, I performed repeat cardiac catheterization after he consented to changes DNR status for the catheterization.  This revealed significant multivessel CAD with 90% diffuse proximal stenosis in the first diagonal branch of the LAD with 70% LAD stenosis beyond the diagonal takeoff.  There was 95% ostial left circumflex stenosis the vessel immediately bifurcating into the AV groove and circumflex.  The RCA had 40% the previously placed proximal stent with focal 80% mid distal in-stent restenosis and new 70% stenosis beyond the stented segment proximal to the acute margin large dominant RCA.  An echo Doppler study revealed EF of 55 to 60% without LVH.  ``Following the catheterization we discussed consideration for possible CABG revascularization surgery and obtain surgical consultation.  Apparently, the patient refused to consider CABG revascularization ultimately was subsequently discharged.  He has  been seen by Jonathan Lofts, PA-C in the office on  August 02, 2019.  He again stated his desire not to have surgery.  I saw him for his post hospital evaluation with me on November 05, 2019.  At that time he admitted to experiencing almost daily episodes of angina for several months.  He was not very active.  He also admits to exertional dyspnea.  He  reduced his 1 pack/day tobacco  habit down to 1/2 pack/day.  He has been smoking for over 60 years.  During his hospitalization, he had marked hyperlipidemia with total cholesterol 288, LDL cholesterol 196.  He was ultimately approved for Repatha and received his first PCSK9 inhibitor injection on October 28, 2019.  During that evaluation, I recommended the further titration of isosorbide mononitrate to 60 mg and increase Toprol-XL to 37.5 mg from his present dose of 25 mg.  I also recommended the addition of amlodipine 2.5 mg for the first week and then as long as his blood pressure is doing well he could further increase this to 5 mg.  He did not feel the 2.5 mg dose was effective, never titrated it and ultimately stopped taking it.    I  saw him in June 2021 at which time he was continuing to experience frequent anginal symptoms with activity.  Unfortunately he was still smoking half pack of cigarettes per day.  He had received 8 Repatha injections over the previous several months.  During that evaluation, I again discussed potential future reconsideration of CABG revascularization.  During that evaluation, his blood pressure was stable but on the low side.  He had never titrated amlodipine from 2.5 mg and felt it was not necessary so therefore stopped taking it.  During that evaluation I recommended initiation of ranolazine at 500 mg twice a day with further plans to titrate.  When I last saw him on 08/03/2020 he walked into the office today complaining of ongoing chest pain. Immediately upon coming into the room, he was placed on a stretcher but states he could not lie flat so therefore he was somewhat upright.  He was given sublingual nitroglycerin.  His chest pain promptly relieved.  ECG was taken which did not reveal any acute ischemic changes.  He states he typically experiences chest pain when he wakes up and then typically has another episode of chest pain approximately 4 PM in the afternoon and this occurs almost on a daily basis.   He has been on a regimen of amlodipine which I had titrated to 5 mg at his last visit, aspirin/Plavix, isosorbide 60 mg in the morning, metoprolol succinate 37.5 mg daily, ramipril 10 mg, and Ranexa 500 mg twice a day.  He continues to receive Repatha injections 140 mg every 2 weeks.  During that evaluation, I recommended further titration of his anti-ischemic medications and increased his isosorbide to 90 mg in the morning and recommended a 30 mg at night and increases Ranexa to 1000 mg twice a day.  That evaluation I again discussed reconsideration for CABG revascularization.  He left the office stating that he will contemplate this and may want to pursue this after the new year.  His chest pain did improve with increased medical therapy but he continued to experience recurrent chest pain symptomatology with very frequent sublingual nitroglycerin use.  He presented to the emergency room on 09/17/2019.  His ECG was stable.  His amlodipine dose was increased to 10 mg.  On his increased dose he has noticed lower extremity and ankle swelling.  He continues to have  daily early morning chest pain and he takes approximately 10-15 sublingual nitroglycerin per week.  He presents for reevaluation and is now interested in pursuing CABG revascularization surgery.  Past Medical History:  Diagnosis Date  . Arthritis    hands   . Chronic bronchitis ([place])   . Chronic sinusitis of both maxillary sinuses 04/16/2018   Sinus CT 04/25/2018 Mild mucoperiosteal thickening involving the frontoethmoidal sinuses. Otherwise largely clear sinuses with no evidence for acute sinusitis.   [name] COPD GOLD II   active smoker 04/16/2018   Spirometry 04/16/2018  FEV1 1.43 (47%)  Ratio 62  s prior rx  - 04/16/2018    try symbicort 160 2bid - PFT's  06/08/2018  FEV1 2.03 (71 % ) ratio 61  p 5 % improvement from saba p nothing prior to study with DLCO  63 % corrects to 72  % for alv volume    -  06/08/2018  After extensive coaching inhaler device,   effectiveness =    75%  (short ti/ variable flow) > continue symbicort 160 2bid   . Coronary artery disease   . Coronary artery disease involving native coronary artery of native heart with unstable angina pectoris ([place])   . DOE (dyspnea on exertion) 04/16/2018  . Essential hypertension, benign 09/23/2012   Changed acei to arb 04/16/2018 due to pseudowheeze > improved 06/08/2018   . Hernia, inguinal 09/23/2012  . Hyperlipidemia    has not tolerated statins,  would not pursue PSK9 inhibitor as advised by Dr Claiborne Billings  . Hypertension   . Myocardial infarction ([place])   . NSTEMI (non-ST elevated myocardial infarction) ([place]) 09/23/2012  . 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 Tobacco abuse     Past Surgical History:  Procedure Laterality Date  . CARDIAC CATHETERIZATION    . CARDIOVASCULAR STRESS TEST  11/11/2011  . CORONARY ANGIOPLASTY     stents- 01/2004   . heart stents  01/2004  . INGUINAL HERNIA REPAIR Right 12/06/2012   Procedure: HERNIA REPAIR INGUINAL ADULT;  Surgeon: [name], DO;  Location: [facility];  Service: General;  Laterality: Right;  . INSERTION OF MESH Right 12/06/2012   Procedure: INSERTION OF MESH;  Surgeon: [name], DO;  Location: [facility];  Service: General;  Laterality: Right;  . LEFT HEART CATH AND CORONARY ANGIOGRAPHY N/A 07/22/2019   Procedure: LEFT HEART CATH AND CORONARY ANGIOGRAPHY;  Surgeon: [name], MD;  Location: [place] [facility];  Service: Cardiovascular;  Laterality: N/A;    Allergies  Allergen Reactions  . Niaspan [Niacin Er] Itching and Other (See Comments)    insomnia  . Statins     LFT elevavation, CK elevevation    Current Outpatient Medications  Medication Sig Dispense Refill  . albuterol (PROVENTIL HFA;VENTOLIN HFA) 108 (90 Base) MCG/ACT inhaler Inhale 2 puffs into the lungs every 4 (four) hours as needed for wheezing or shortness of breath (cough, shortness of breath or wheezing.). 1 Inhaler 5  . aspirin EC 81 MG tablet Take 81 mg by mouth  every morning.    . budesonide-formoterol (SYMBICORT) 160-4.5 MCG/ACT inhaler Inhale 2 puffs into the lungs 2 (two) times daily. (Patient taking differently: Inhale 2 puffs into the lungs 2 (two) times daily as needed (asthma).) 1 each 11  . clopidogrel (PLAVIX) 75 MG tablet TAKE 1 TABLET BY MOUTH  DAILY (Patient taking differently: Take 75 mg by mouth daily.) 90 tablet 3  . furosemide (LASIX) 20 MG tablet Take 1 tablet (20 mg total) by mouth  daily as needed (SWELLING). 30 tablet 6  . metoprolol succinate (TOPROL-XL) 25 MG 24 hr tablet Take 1.5 tablets (37.5 mg total) by mouth daily. 135 tablet 1  . nitroGLYCERIN (NITROSTAT) 0.4 MG SL tablet PLACE 1 TABLET UNDER THE TONGUE EVERY 5 MINUTES AS NEEDED FOR CHEST PAIN UP TO 3 DOSES, IF SYMPTOMS PERSIST CALL 911 (Patient taking differently: Place 0.4 mg under the tongue every 5 (five) minutes as needed for chest pain.) 25 tablet 6  . ramipril (ALTACE) 10 MG capsule TAKE 1 CAPSULE BY MOUTH  DAILY (Patient taking differently: Take 10 mg by mouth daily.) 90 capsule 3  . ranolazine (RANEXA) 1000 MG SR tablet Take 1 tablet (1,000 mg total) by mouth 2 (two) times daily. 60 tablet 11  . amLODipine (NORVASC) 5 MG tablet Take 1 tablet (5 mg total) by mouth daily. 30 tablet 6  . colchicine 0.6 MG tablet Take 0.6 mg by mouth 2 (two) times daily as needed (gout).    . Evolocumab (REPATHA SURECLICK) 093 MG/ML SOAJ Inject 140 mg into the skin every 14 (fourteen) days. (Patient not taking: No sig reported) 2 pen 11  . isosorbide mononitrate (IMDUR) 60 MG 24 hr tablet Take to 2 tablet ( 120 mg) in the morning and  1 tablet (60 mg ) at bedtime (Patient taking differently: Take 60-120 mg by mouth See admin instructions. Take to 2 tablet ( 120 mg) in the morning and  1 tablet (60 mg ) at bedtime) 90 tablet 6   No current facility-administered medications for this visit.    Social History   Socioeconomic History  . Marital status: Married    Spouse name: Not on file  .  Number of children: Not on file  . Years of education: Not on file  . Highest education level: Not on file  Occupational History  . Not on file  Tobacco Use  . Smoking status: Current Every Day Smoker    Packs/day: 1.00    Years: 53.00    Pack years: 53.00    Types: Cigarettes  . Smokeless tobacco: Never Used  Vaping Use  . Vaping Use: Never used  Substance and Sexual Activity  . Alcohol use: Yes    Alcohol/week: 5.0 standard drinks    Types: 5 Standard drinks or equivalent per week    Comment: consumes 1 pint per day   . Drug use: No  . Sexual activity: Yes  Other Topics Concern  . 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 Has one grandchild. He works in Education officer, museum and remains busy. He still smoking. He does use occasional alcohol.  Family history is notable that his father died with heart disease at age 51.  ROS General: Negative; No fevers, chills, or night sweats;  HEENT: Negative; No changes in vision or hearing, sinus congestion, difficulty swallowing Pulmonary:  Mild shortness of breath, no hemoptysis Cardiovascular: See HPI GI: Negative; No nausea, vomiting, diarrhea, or abdominal pain GU: Negative; No dysuria, hematuria, or difficulty voiding Musculoskeletal: Negative; no  myalgias, joint pain, or weakness Hematologic/Oncology: Negative; no easy bruising, bleeding Endocrine: Negative; no heat/cold intolerance; no diabetes Neuro: Negative; no changes in balance, headaches Skin: Negative; No rashes or skin lesions Psychiatric: Negative; No behavioral  problems, depression Sleep: Negative; No snoring, daytime sleepiness, hypersomnolence, bruxism, restless legs, hypnogognic hallucinations, no cataplexy Other comprehensive 14 point system review is negative.   PE BP 100/60 (BP Location: Left Arm, Patient Position: Sitting)   Pulse (!) 59   Ht 5' 8" (1.727 m)   Wt 222 lb 6.4 oz (100.9 kg)   SpO2 93%   BMI 33.82 kg/m    Blood pressure by me was 108/64..  Wt Readings from Last 3 Encounters:  10/07/20 222 lb 6.4 oz (100.9 kg)  09/16/20 220 lb (99.8 kg)  08/03/20 220 lb (99.8 kg)    BP 100/60 (BP Location: Left Arm, Patient Position: Sitting)   Pulse (!) 59   Ht 5' 8" (1.727 m)   Wt 222 lb 6.4 oz (100.9 kg)   SpO2 93%   BMI 33.82 kg/m  General: Alert, oriented, no distress.  Skin: normal turgor, no rashes, warm and dry HEENT: Normocephalic, atraumatic. Pupils equal round and reactive to light; sclera anicteric; extraocular muscles intact;  Nose without nasal septal hypertrophy Mouth/Parynx benign; Mallinpatti scale 3 Neck: No JVD, no carotid bruits; normal carotid upstroke Lungs: clear to ausculatation and percussion; no wheezing or rales Chest wall: without tenderness to palpitation Heart: PMI not displaced, RRR, s1 s2 normal, 1/6 systolic murmur, no diastolic murmur, no rubs, gallops, thrills, or heaves Abdomen: soft, nontender; no hepatosplenomehaly, BS+; abdominal aorta nontender and not dilated by palpation. Back: no CVA tenderness Pulses 2+ Musculoskeletal: full range of motion, normal strength, no joint deformities Extremities: 2+ ankle edema 1+ pretibial; no clubbing cyanosis or edema, Homan's sign negative  Neurologic: grossly nonfocal; Cranial nerves grossly wnl Psychologic: Normal mood and affect   ECG (independently read by me): Sinus Bradycardia at 59; No significant ST changes  August 03, 2020 ECG (independently read by me): Normal sinus rhythm at 63 bpm.  Nonspecific ST changes.  Normal intervals.  No  ectopy  February 12, 2020 ECG (independently read by me): Sinus bradycardia at 50 bpm.  PR interval 192 ms.  QTc interval 417 ms.  No significant ST-T changes.  No ectopy.  November 05, 2019 ECG (independently read by me): Sinus rhythm with occasional to frequent PVCs with VA retrograde P waves  January 2019 ECG (independently read by me): Sinus bradycardia 59 bpm.  No ectopy.  Normal intervals.  April 2018 ECG (independently read by me): Normal sinus rhythm at 61 bpm.  No ectopy.  Normal intervals.  April 2017 ECG (independently read by me): Normal sinus rhythm with an occasional PVC with retrograde P wave.  November 2015 ECG (independently read by me): Sinus bradycardia 52 bpm  Prior ECG: Sinus rhythm at 61 beats per minute. No ectopy. Normal intervals.  LABS:  BMP Latest Ref Rng & Units 10/07/2020 09/16/2020 07/25/2019  Glucose 65 - 99 mg/dL 70 90 92  BUN 8 - 27 mg/dL _0 Creatinine 0.76 - 1.27 mg/dL 1.07 1.15 1.19  BUN/Creat Ratio 10 - 24 9(L) - -  Sodium 134 - 144 mmol/L 138 135 139  Potassium 3.5 - 5.2 mmol/L 4.3 5.1 3.9  Chloride 96 - 106 mmol/L 94(L) 96(L) 104  CO2 20 - 29 mmol/L _1 Calcium 8.6 - 10.2 mg/dL 9.2 9.1 8.8(L)   Hepatic Function Latest Ref  Rng & Units 07/20/2019  Total Protein 6.5 - 8.1 g/dL 7.6  Albumin 3.5 - 5.0 g/dL 4.1  AST 15 - 41 U/L 32  ALT 0 - 44 U/L 18  Alk Phosphatase 38 - 126 U/L 52  Total Bilirubin 0.3 - 1.2 mg/dL 0.8  Bilirubin, Direct 0.0 - 0.2 mg/dL 0.2   CBC Latest Ref Rng & Units 10/07/2020 09/16/2020 07/25/2019  WBC 3.4 - 10.8 x10E3/uL 8.8 8.9 8.6  Hemoglobin 13.0 - 17.7 g/dL 15.5 15.8 14.0  Hematocrit 37.5 - 51.0 % 45.7 46.2 41.8  Platelets 150 - 450 x10E3/uL 262 253 197    Lab Results  Component Value Date   MCV 97 10/07/2020   MCV 98.5 09/16/2020   MCV 97.9 07/25/2019   No results found for: TSH   Lipid Panel     Component Value Date/Time   CHOL 288 (H) 07/21/2019 0338   TRIG 79 07/21/2019 0338   HDL 76 07/21/2019  0338   CHOLHDL 3.8 07/21/2019 0338   VLDL 16 07/21/2019 0338   LDLCALC 196 (H) 07/21/2019 0338    RADIOLOGY: No results found.  IMPRESSION:  1. Coronary artery disease involving native coronary artery of native heart with unstable angina pectoris ([place])   2. Hyperlipidemia with target LDL less than 70   3. Statin intolerance   4. Bilateral carotid artery stenosis   5. Angina pectoris ([place]): NYHA class 4   6. Preop cardiovascular exam   7. Tobacco abuse      ASSESSMENT AND PLAN: Jonathan [name] is a 65 -year-old gentleman who is 16 1/2 years since his inferior wall myocardial infarction and emergent catheterization in May 2005 at which time he underwent successful intervention to a totally occluded mid RCA with insertion of tandem drug-eluting Cypher stents.  He had concomitant CAD involving his LAD and diagonal vessel. He developed CPK and LFT elevation with statin therapy also stopped taking all of her lipid-lowering treatment.  He  continued to smoke cigarettes.  He was hospitalized in November 2020 when he presented with non-ST segment elevation MI.  He was found to have severe multivessel CAD for which CABG revascularization surgery was recommended.  He was seen by Dr. Arvid Right. Preoperative carotid Doppler imaging showed mild 1 to 39% right ICA stenosis with left ICA velocities in the 40 to 59% range.  However, he ultimately refused the opportunity for bypass.  He states his father had a stroke during a bypass procedure and he does not want this to happen to him.  During his hospitalization and at subsequent office visits he was adamant that he did not want bypass surgery.  At subsequent evaluations he was having class III symptoms of angina.  He was having occasional PVCs.  At his  office visit in June 2021 his ECG showed  sinus bradycardia at 50 without ectopy on his slightly increase metoprolol succinate at 37.5 mg.  When I last saw him in November 2021 he was on a medical regimen   consisting of amlodipine 5 mg daily, isosorbide 60 mg in the morning, metoprolol succinate 37.5 mg daily, ramipril 10 mg, and ranolazine 500 mg twice a day.  He has continued to be on aspirin and Plavix.  Was having daily chest pain and typically was waking up in the morning with chest pain.  At that time I titrated his isosorbide to 90 mg in the morning and added a 30 mg evening dose and recommended further titration of Ranexa to 1000 mg twice  a day.  This did improve his symptomatology but he continued to experience recurrent chest pain.  He was recently reevaluated in the emergency room on 09/16/2020 and amlodipine dose was increased to 10 mg.  This has resulted in increasing leg swelling.  He has continued to have daily sublingual nitroglycerin use with chest pain every morning and takes approximately 10-15 sublingual nitroglycerin per week.  He is now tired of taking so much sublingual nitroglycerin and has now agreed to undergo CABG revascularization surgery.  With his significant leg swelling I am reducing amlodipine back down to 5 mg and will increase isosorbide to 120 mg in the morning and 60 mg at night.  I also have given him a prescription for Lasix 20 mg to take as needed depending upon his leg swelling.  His last catheterization was in November 2020 he will need a reevaluation prior to CABG revascularization.  We will schedule him to undergo repeat cardiac catheterization next week on February 2 and he was advised to hold his Plavix from January 29.  We will try to arrange for Dr. Cyndia Bent to see following his catheterization on Wednesday, February 2 and that hopefully CABG surgery can be performed the following day if schedule allows since he will have had a Plavix washout.The risks and benefits of a cardiac catheterization including, but not limited to, death, stroke, MI, kidney damage and bleeding were discussed with the patient who indicates understanding and agrees to proceed.  He continues to be on  Repatha for PCSK9 inhibition in light of his statin intolerance.   [name], MD, [facility]  10/08/2020 4:58 PM

## 2020-10-08 ENCOUNTER — Encounter: Payer: Self-pay | Admitting: Cardiovascular Disease

## 2020-10-08 LAB — BASIC METABOLIC PANEL
BUN/Creatinine Ratio: 9 — ABNORMAL LOW (ref 10–24)
BUN: 10 mg/dL (ref 8–27)
CO2: 27 mmol/L (ref 20–29)
Calcium: 9.2 mg/dL (ref 8.6–10.2)
Chloride: 94 mmol/L — ABNORMAL LOW (ref 96–106)
Creatinine, Ser: 1.07 mg/dL (ref 0.76–1.27)
GFR calc Af Amer: 80 mL/min/{1.73_m2} (ref >59)
GFR calc non Af Amer: 69 mL/min/{1.73_m2} (ref >59)
Glucose: 70 mg/dL (ref 65–99)
Potassium: 4.3 mmol/L (ref 3.5–5.2)
Sodium: 138 mmol/L (ref 134–144)

## 2020-10-08 LAB — CBC
Hematocrit: 45.7 % (ref 37.5–51.0)
Hemoglobin: 15.5 g/dL (ref 13.0–17.7)
MCH: 32.8 pg (ref 26.6–33.0)
MCHC: 33.9 g/dL (ref 31.5–35.7)
MCV: 97 fL (ref 79–97)
Platelets: 262 10*3/uL (ref 150–450)
RBC: 4.73 x10E6/uL (ref 4.14–5.80)
RDW: 11.9 % (ref 11.6–15.4)
WBC: 8.8 10*3/uL (ref 3.4–10.8)

## 2020-10-10 ENCOUNTER — Other Ambulatory Visit (HOSPITAL_COMMUNITY): Payer: Medicare Other

## 2020-10-12 ENCOUNTER — Other Ambulatory Visit (HOSPITAL_COMMUNITY)
Admission: RE | Admit: 2020-10-12 | Discharge: 2020-10-12 | Disposition: A | Discharge status: Home / Self Care | Payer: Medicare Other | Source: Ambulatory Visit | Attending: Cardiovascular Disease | Admitting: Cardiovascular Disease

## 2020-10-12 ENCOUNTER — Telehealth: Payer: Self-pay | Admitting: *Deleted

## 2020-10-12 DIAGNOSIS — Z01812 Encounter for preprocedural laboratory examination: Secondary | ICD-10-CM | POA: Insufficient documentation

## 2020-10-12 DIAGNOSIS — Z20822 Contact with and (suspected) exposure to covid-19: Secondary | ICD-10-CM | POA: Insufficient documentation

## 2020-10-12 LAB — SARS CORONAVIRUS 2 (TAT 6-24 HRS): SARS Coronavirus 2: NEGATIVE

## 2020-10-12 NOTE — Telephone Encounter (Addendum)
Pt contacted pre-catheterization scheduled at [facility] for:  Wednesday October 14, 2020 8:30 AM Verified arrival time and place: [facility]) at: 6:30 AM   No solid food after midnight prior to cath, clear liquids until 5 AM day of procedure.  Hold: Plavix-last dose 10/10/20 per pt Lasix-AM of procedure  Except hold medications AM meds can be  taken pre-cath with sips of water including: ASA 81 mg   Confirmed patient has responsible adult to drive home post procedure and be with patient first 24 hours after arriving home: yes  You are allowed ONE visitor in the waiting room during the time you are at the hospital for your procedure. Both you and your visitor must wear a mask once you enter the hospital.  Reviewed procedure/mask/visitor instructions with patient.

## 2020-10-13 NOTE — Progress Notes (Signed)
Per Dr Claiborne Billings: The patient is scheduled for his cardiac catheterization on Wednesday. Depending upon anatomy, hopefully surgery can be done this week. He can be evaluated by another cardiac surgeon following his cardiac catheterization. He had only seen Dr. Cyndia Bent on 1 occasion in the hospital several years ago when the patient refused surgery.

## 2020-10-14 ENCOUNTER — Inpatient Hospital Stay (HOSPITAL_COMMUNITY): Payer: Medicare Other

## 2020-10-14 ENCOUNTER — Encounter (HOSPITAL_COMMUNITY)
Admission: RE | Disposition: A | Discharge status: Rehabilitation Facility | Payer: Self-pay | Source: Home / Self Care | Attending: Cardiothoracic Surgery

## 2020-10-14 ENCOUNTER — Inpatient Hospital Stay (HOSPITAL_COMMUNITY)
Admission: RE | Admit: 2020-10-14 | Discharge: 2020-10-25 | DRG: 233 | Disposition: A | Discharge status: Rehabilitation Facility | Payer: Medicare Other | Attending: Cardiothoracic Surgery | Admitting: Cardiothoracic Surgery

## 2020-10-14 ENCOUNTER — Encounter (HOSPITAL_COMMUNITY): Payer: Self-pay | Admitting: Cardiovascular Disease

## 2020-10-14 ENCOUNTER — Other Ambulatory Visit: Payer: Self-pay

## 2020-10-14 DIAGNOSIS — Z9889 Other specified postprocedural states: Secondary | ICD-10-CM

## 2020-10-14 DIAGNOSIS — I25110 Atherosclerotic heart disease of native coronary artery with unstable angina pectoris: Principal | ICD-10-CM | POA: Diagnosis present

## 2020-10-14 DIAGNOSIS — Z66 Do not resuscitate: Secondary | ICD-10-CM | POA: Diagnosis present

## 2020-10-14 DIAGNOSIS — Z823 Family history of stroke: Secondary | ICD-10-CM

## 2020-10-14 DIAGNOSIS — R739 Hyperglycemia, unspecified: Secondary | ICD-10-CM | POA: Diagnosis present

## 2020-10-14 DIAGNOSIS — J449 Chronic obstructive pulmonary disease, unspecified: Secondary | ICD-10-CM | POA: Diagnosis present

## 2020-10-14 DIAGNOSIS — I69391 Dysphagia following cerebral infarction: Secondary | ICD-10-CM | POA: Diagnosis not present

## 2020-10-14 DIAGNOSIS — Z951 Presence of aortocoronary bypass graft: Secondary | ICD-10-CM

## 2020-10-14 DIAGNOSIS — Z8249 Family history of ischemic heart disease and other diseases of the circulatory system: Secondary | ICD-10-CM

## 2020-10-14 DIAGNOSIS — E8770 Fluid overload, unspecified: Secondary | ICD-10-CM | POA: Diagnosis not present

## 2020-10-14 DIAGNOSIS — Z7902 Long term (current) use of antithrombotics/antiplatelets: Secondary | ICD-10-CM | POA: Diagnosis not present

## 2020-10-14 DIAGNOSIS — Z515 Encounter for palliative care: Secondary | ICD-10-CM

## 2020-10-14 DIAGNOSIS — Z7189 Other specified counseling: Secondary | ICD-10-CM | POA: Diagnosis not present

## 2020-10-14 DIAGNOSIS — I10 Essential (primary) hypertension: Secondary | ICD-10-CM | POA: Diagnosis present

## 2020-10-14 DIAGNOSIS — E7800 Pure hypercholesterolemia, unspecified: Secondary | ICD-10-CM | POA: Diagnosis present

## 2020-10-14 DIAGNOSIS — R0682 Tachypnea, not elsewhere classified: Secondary | ICD-10-CM

## 2020-10-14 DIAGNOSIS — I639 Cerebral infarction, unspecified: Secondary | ICD-10-CM | POA: Diagnosis not present

## 2020-10-14 DIAGNOSIS — R7989 Other specified abnormal findings of blood chemistry: Secondary | ICD-10-CM | POA: Diagnosis not present

## 2020-10-14 DIAGNOSIS — E669 Obesity, unspecified: Secondary | ICD-10-CM | POA: Diagnosis present

## 2020-10-14 DIAGNOSIS — I63442 Cerebral infarction due to embolism of left cerebellar artery: Secondary | ICD-10-CM | POA: Diagnosis not present

## 2020-10-14 DIAGNOSIS — Z01810 Encounter for preprocedural cardiovascular examination: Secondary | ICD-10-CM | POA: Diagnosis not present

## 2020-10-14 DIAGNOSIS — Z888 Allergy status to other drugs, medicaments and biological substances status: Secondary | ICD-10-CM

## 2020-10-14 DIAGNOSIS — Z7982 Long term (current) use of aspirin: Secondary | ICD-10-CM | POA: Diagnosis not present

## 2020-10-14 DIAGNOSIS — Z6834 Body mass index (BMI) 34.0-34.9, adult: Secondary | ICD-10-CM

## 2020-10-14 DIAGNOSIS — I252 Old myocardial infarction: Secondary | ICD-10-CM | POA: Diagnosis not present

## 2020-10-14 DIAGNOSIS — T82855A Stenosis of coronary artery stent, initial encounter: Secondary | ICD-10-CM | POA: Diagnosis present

## 2020-10-14 DIAGNOSIS — Z20822 Contact with and (suspected) exposure to covid-19: Secondary | ICD-10-CM | POA: Diagnosis present

## 2020-10-14 DIAGNOSIS — R5381 Other malaise: Secondary | ICD-10-CM | POA: Diagnosis present

## 2020-10-14 DIAGNOSIS — Z720 Tobacco use: Secondary | ICD-10-CM

## 2020-10-14 DIAGNOSIS — G4733 Obstructive sleep apnea (adult) (pediatric): Secondary | ICD-10-CM | POA: Diagnosis present

## 2020-10-14 DIAGNOSIS — R1310 Dysphagia, unspecified: Secondary | ICD-10-CM | POA: Diagnosis present

## 2020-10-14 DIAGNOSIS — R600 Localized edema: Secondary | ICD-10-CM | POA: Diagnosis present

## 2020-10-14 DIAGNOSIS — Z79899 Other long term (current) drug therapy: Secondary | ICD-10-CM | POA: Diagnosis not present

## 2020-10-14 DIAGNOSIS — J441 Chronic obstructive pulmonary disease with (acute) exacerbation: Secondary | ICD-10-CM

## 2020-10-14 DIAGNOSIS — F1010 Alcohol abuse, uncomplicated: Secondary | ICD-10-CM | POA: Diagnosis present

## 2020-10-14 DIAGNOSIS — Z7951 Long term (current) use of inhaled steroids: Secondary | ICD-10-CM

## 2020-10-14 DIAGNOSIS — I6340 Cerebral infarction due to embolism of unspecified cerebral artery: Secondary | ICD-10-CM | POA: Insufficient documentation

## 2020-10-14 DIAGNOSIS — Z419 Encounter for procedure for purposes other than remedying health state, unspecified: Secondary | ICD-10-CM

## 2020-10-14 DIAGNOSIS — Y831 Surgical operation with implant of artificial internal device as the cause of abnormal reaction of the patient, or of later complication, without mention of misadventure at the time of the procedure: Secondary | ICD-10-CM | POA: Diagnosis present

## 2020-10-14 DIAGNOSIS — D62 Acute posthemorrhagic anemia: Secondary | ICD-10-CM | POA: Diagnosis not present

## 2020-10-14 DIAGNOSIS — F17210 Nicotine dependence, cigarettes, uncomplicated: Secondary | ICD-10-CM | POA: Diagnosis present

## 2020-10-14 DIAGNOSIS — R0902 Hypoxemia: Secondary | ICD-10-CM | POA: Diagnosis present

## 2020-10-14 DIAGNOSIS — R2689 Other abnormalities of gait and mobility: Secondary | ICD-10-CM | POA: Diagnosis not present

## 2020-10-14 HISTORY — PX: LEFT HEART CATH AND CORONARY ANGIOGRAPHY: CATH118249

## 2020-10-14 LAB — ECHOCARDIOGRAM COMPLETE
Area-P 1/2: 2.48 cm2
Height: 68 in
Weight: 3520 oz

## 2020-10-14 LAB — PULMONARY FUNCTION TEST
FEF 25-75 Pre: 0.53 L/sec
FEF2575-%Pred-Pre: 23 %
FEV1-%Pred-Pre: 42 %
FEV1-Pre: 1.25 L
FEV1FVC-%Pred-Pre: 70 %
FEV6-%Pred-Pre: 60 %
FEV6-Pre: 2.3 L
FEV6FVC-%Pred-Pre: 100 %
FVC-%Pred-Pre: 59 %
FVC-Pre: 2.42 L
Pre FEV1/FVC ratio: 52 %
Pre FEV6/FVC Ratio: 95 %

## 2020-10-14 LAB — URINALYSIS, ROUTINE W REFLEX MICROSCOPIC
Bilirubin Urine: NEGATIVE
Glucose, UA: NEGATIVE mg/dL
Hgb urine dipstick: NEGATIVE
Ketones, ur: NEGATIVE mg/dL
Leukocytes,Ua: NEGATIVE
Nitrite: NEGATIVE
Protein, ur: NEGATIVE mg/dL
Specific Gravity, Urine: 1.021 (ref 1.005–1.030)
pH: 5 (ref 5.0–8.0)

## 2020-10-14 LAB — ABO/RH: ABO/RH(D): B POS

## 2020-10-14 IMAGING — CT CT CHEST W/O CM
2 of 3 series · 15 of 36 positions shown, 18 images · non-contrast
Comparison: None.

CLINICAL DATA: Thoracic aortic disease. Preop for planning coronary
artery bypass graft.

EXAM:
CT CHEST WITHOUT CONTRAST
TECHNIQUE: Multidetector CT imaging of the chest was performed following the
standard protocol without IV contrast.

[Series 3: chest w/o 2mm st · axial · non-contrast · 0.81mm/px · z∈[-864,-586]mm · 12 of 165 slices shown, 15 images]
[im 13/165  mediastinal]
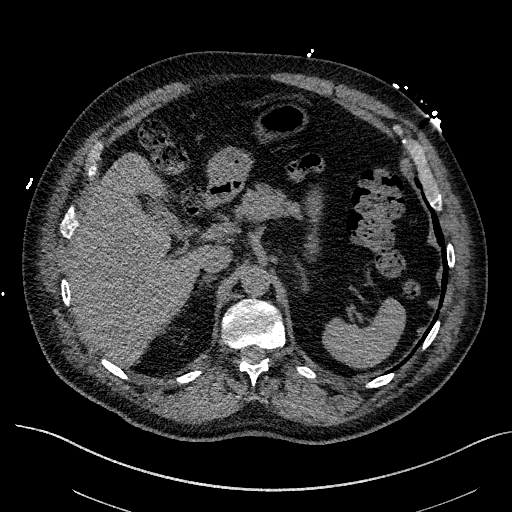
[im 13/165  lung]
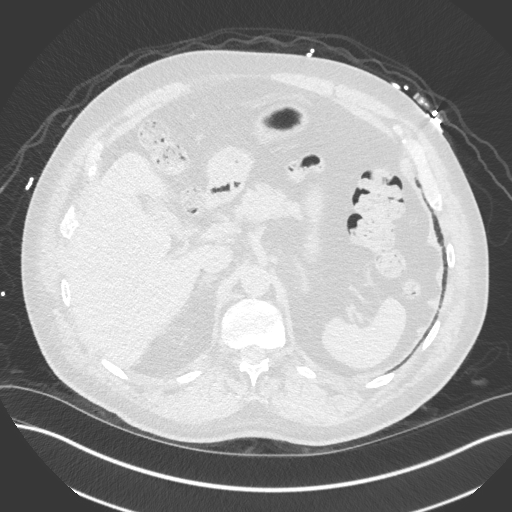
[im 25/165  lung]
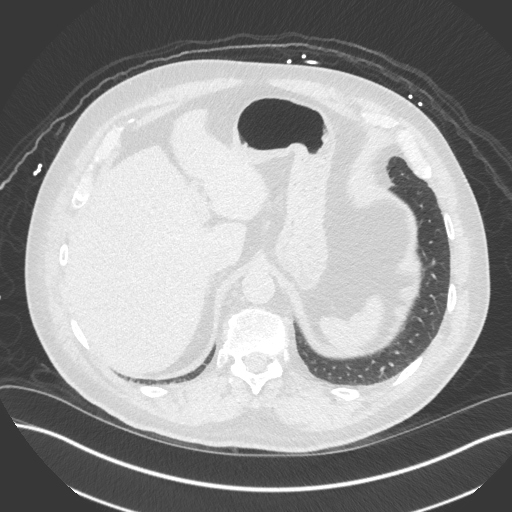
[im 37/165  lung]
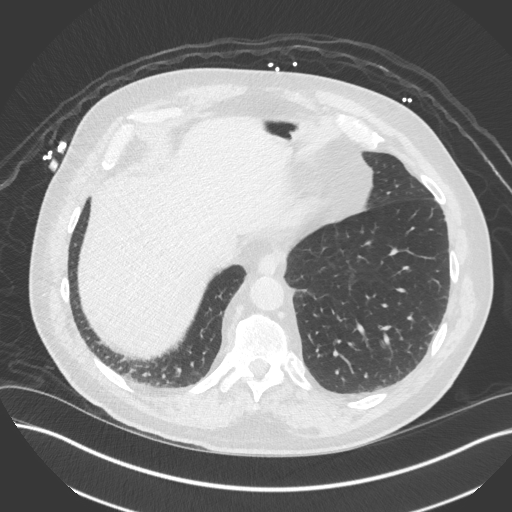
[im 49/165  lung]
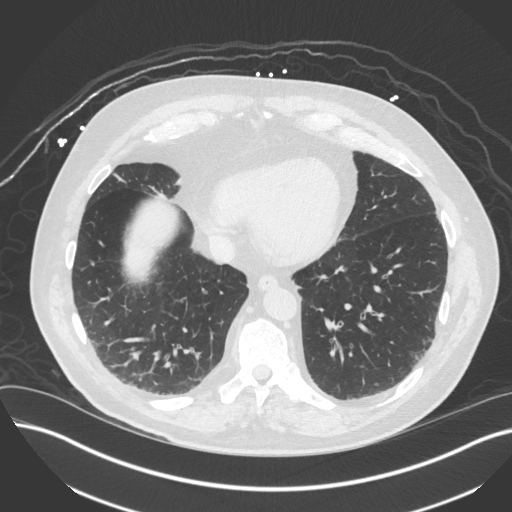
[im 61/165  mediastinal]
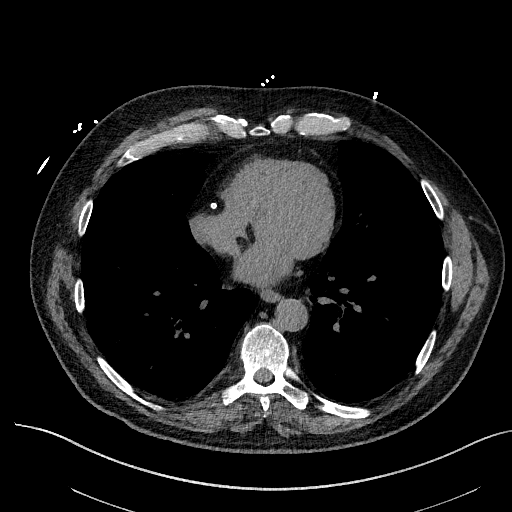
[im 61/165  lung]
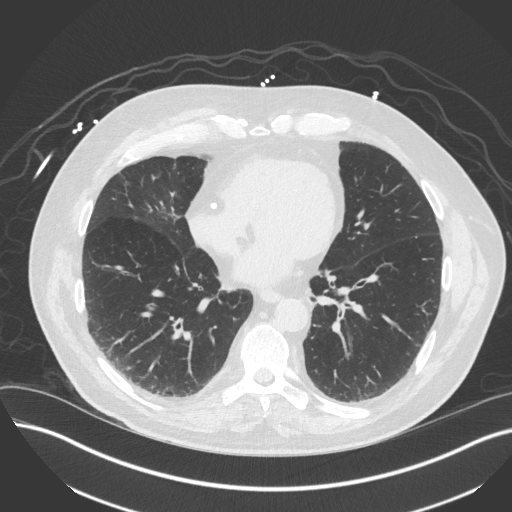
[im 73/165  lung]
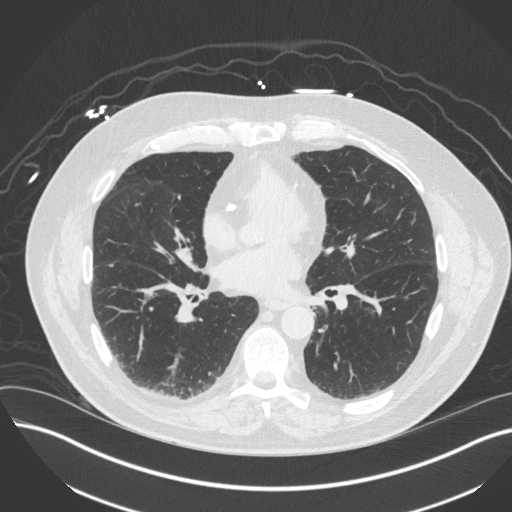
[im 92/165  lung]
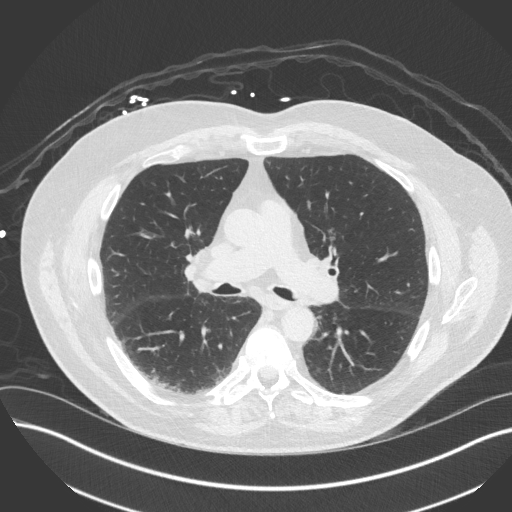
[im 104/165  lung]
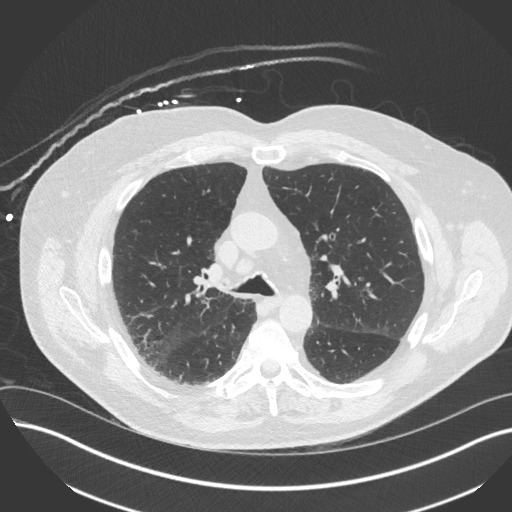
[im 116/165  mediastinal]
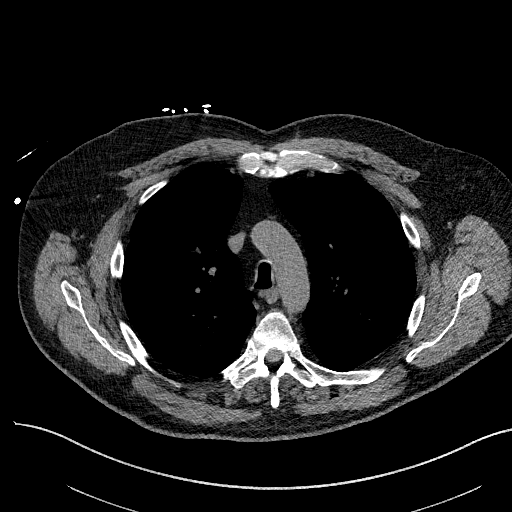
[im 116/165  lung]
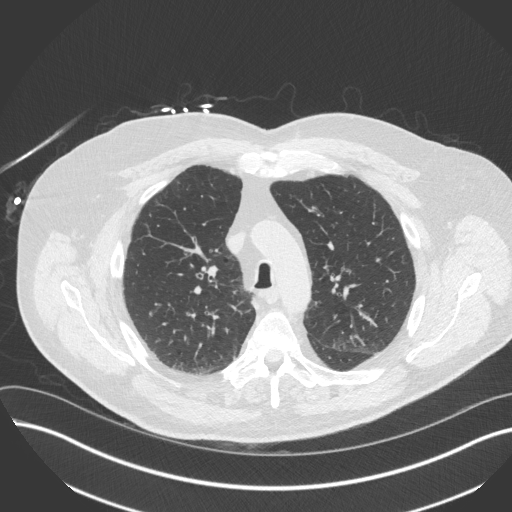
[im 128/165  lung]
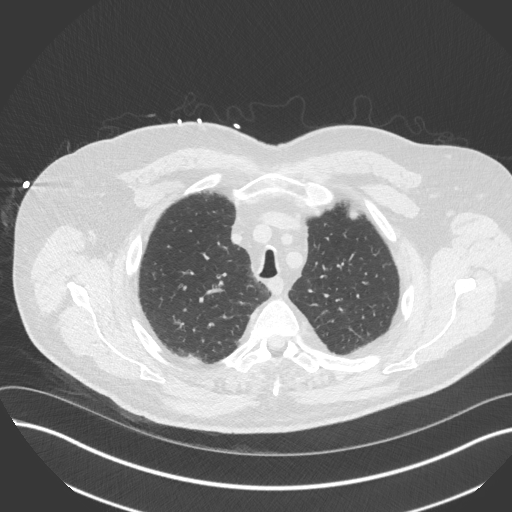
[im 140/165  lung]
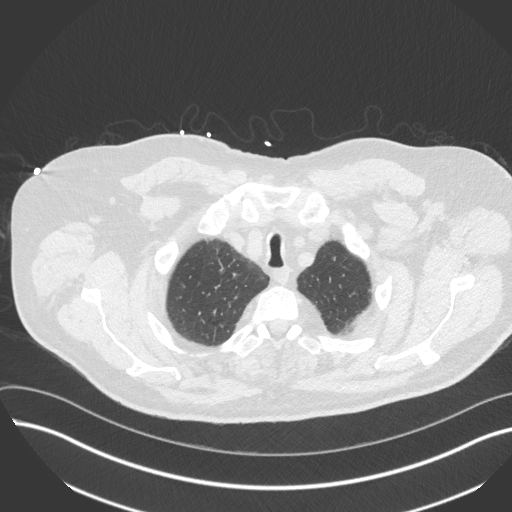
[im 152/165  lung]
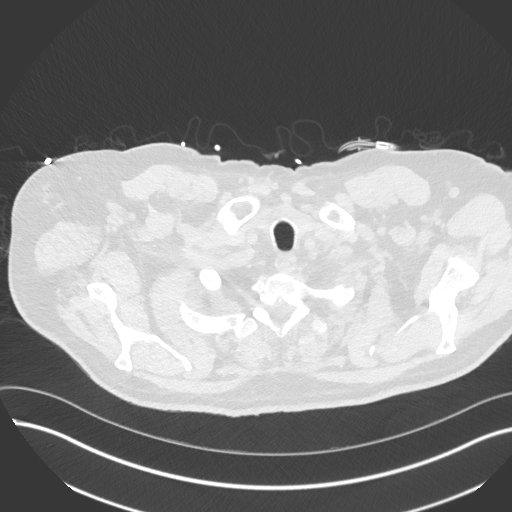

[Series 5: chest w/o 2mm st cor · coronal · non-contrast · 0.70mm/px · 3 of 146 slices shown]
[im 30/146  lung]
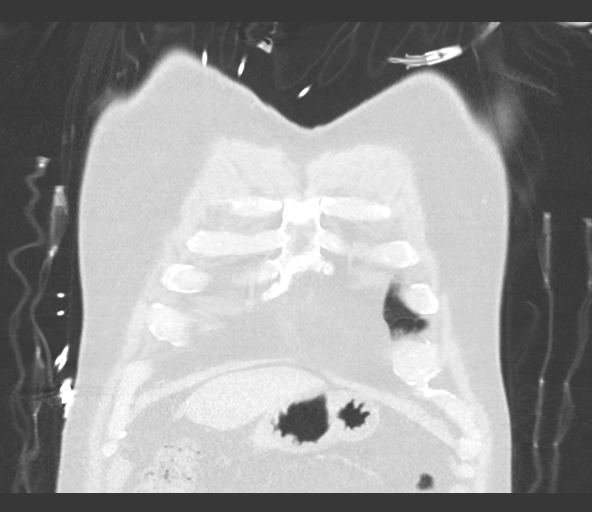
[im 59/146  lung]
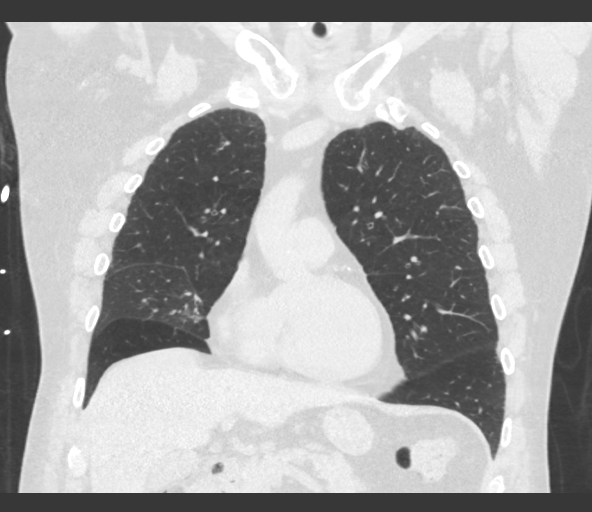
[im 88/146  lung]
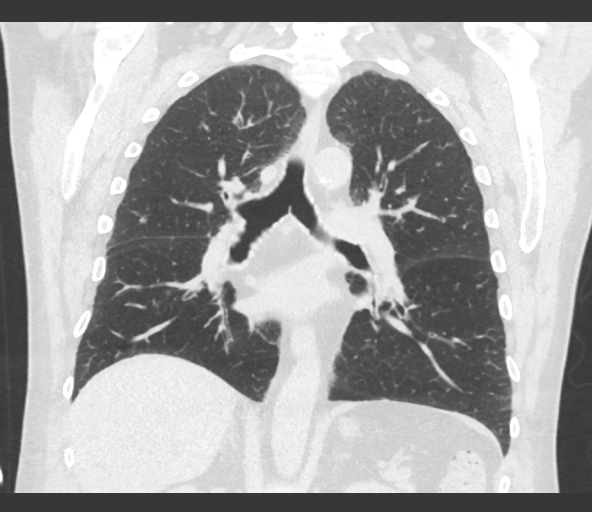

[15 of 36 positions shown; findings below may reference images not displayed]

FINDINGS: Cardiovascular: Atherosclerosis of thoracic aorta is noted without
aneurysm formation. Normal cardiac size. No pericardial effusion.
Mild coronary artery calcifications are noted. Probable stent seen
in right coronary artery.

Mediastinum/Nodes: No enlarged mediastinal or axillary lymph nodes.
Thyroid gland, trachea, and esophagus demonstrate no significant
findings.

Lungs/Pleura: No pneumothorax or pleural effusion is noted. 3 mm
nodule is noted in right upper lobe best seen on image number 76 of
series 4. 4 mm nodule is noted in left upper lobe best seen on image
number 86 of series 4. 5 mm nodule is noted in left upper lobe best
seen on image number 46 of series 4. 3 mm nodule is noted in right
middle lobe best seen on image number 94 series 4. Minimal
subsegmental atelectasis is noted in the right lower lobe.

Upper Abdomen: No acute abnormality.

Musculoskeletal: No chest wall mass or suspicious bone lesions
identified.
IMPRESSION: 1. Atherosclerosis of thoracic aorta is noted without aneurysm
formation.
2. Mild coronary artery calcifications are noted suggesting coronary
artery disease.
3. Minimal subsegmental atelectasis is noted in the right lower
lobe.
4. Multiple bilateral pulmonary nodules are noted, the largest
measuring 5 mm in the left upper lobe. No follow-up needed if
patient is low-risk (and has no known or suspected primary
neoplasm). Non-contrast chest CT can be considered in 12 months if
patient is high-risk. This recommendation follows the consensus
statement: Guidelines for Management of Incidental Pulmonary Nodules
Detected on CT Images: From the [HOSPITAL] [08]; Radiology

Aortic Atherosclerosis ([08]-[08]).

## 2020-10-14 IMAGING — DX DG CHEST 1V PORT
1 series · 1 of 1 positions shown · non-contrast
Comparison: [DATE]

CLINICAL DATA: Preop for coronary artery bypass surgery.

EXAM:
PORTABLE CHEST 1 VIEW

[chest]
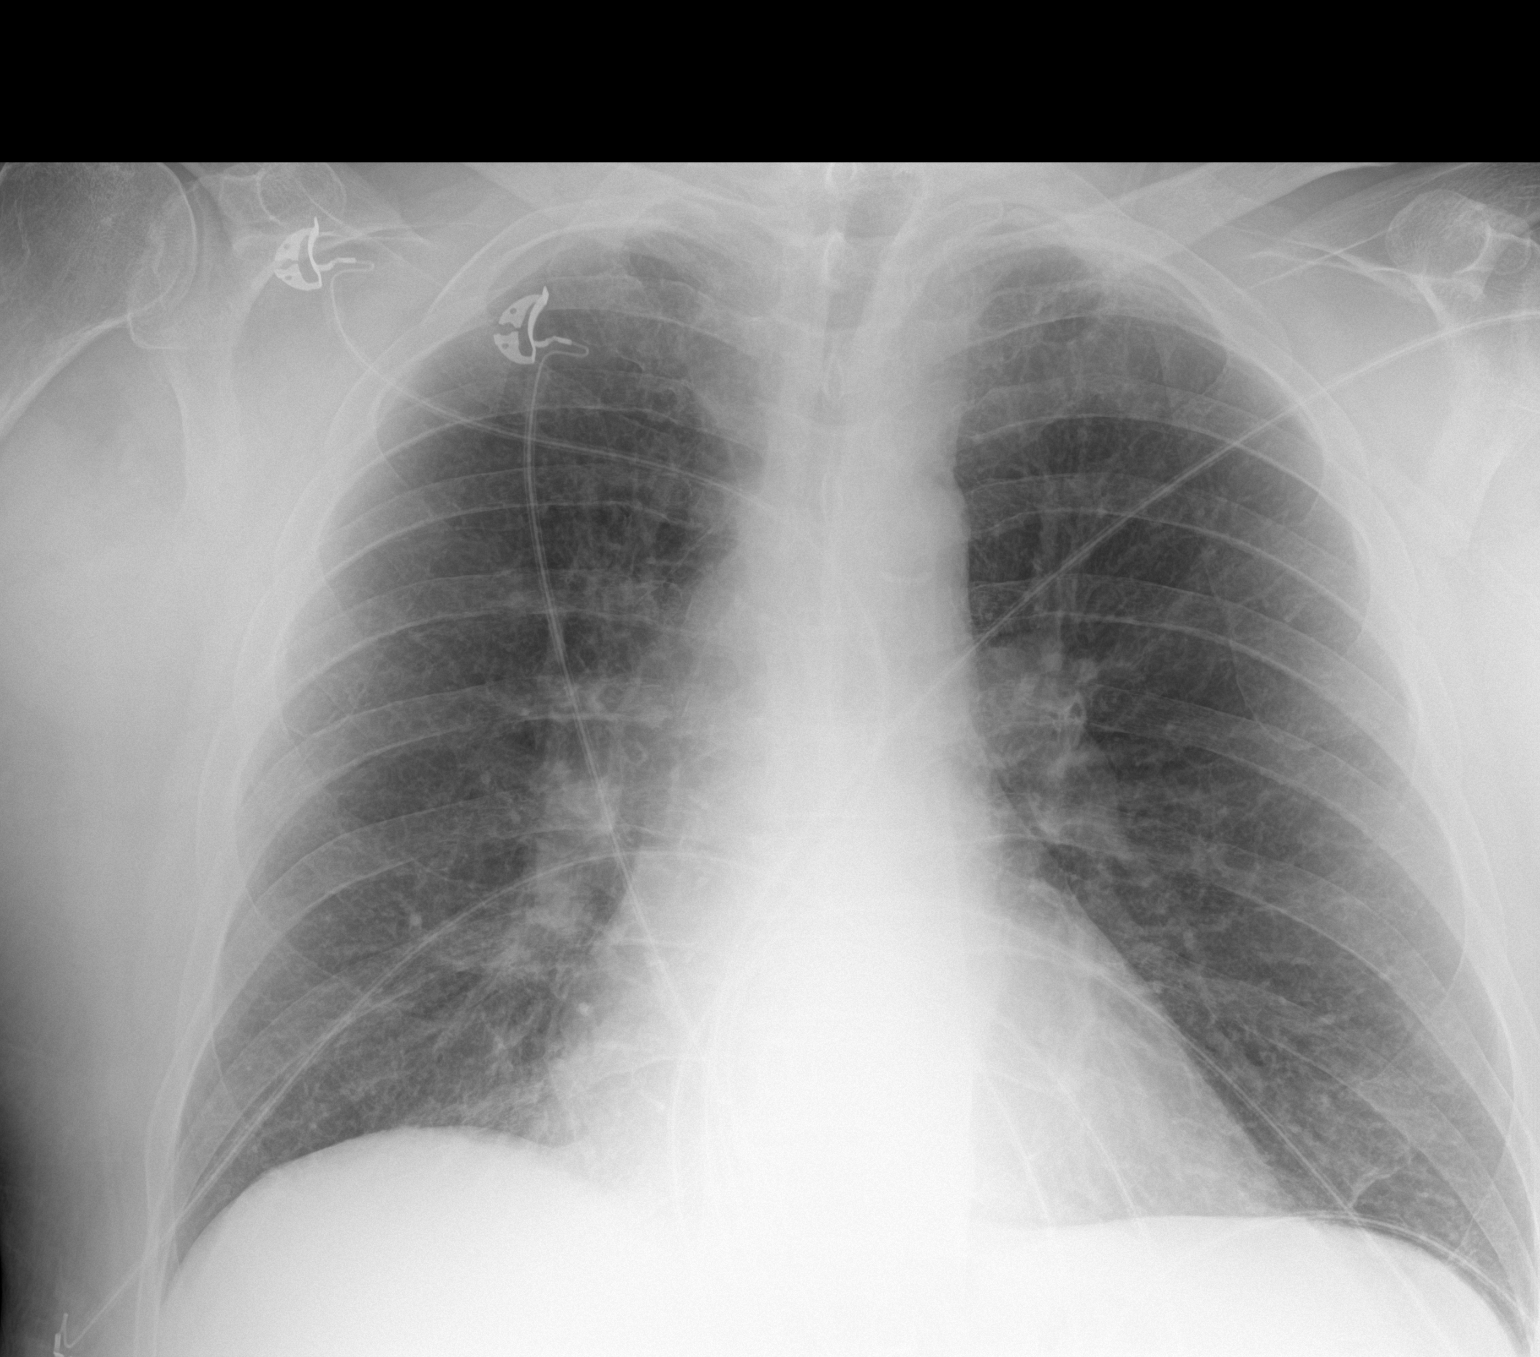

[1 of 1 positions shown; findings below may reference images not displayed]

FINDINGS: The iliac silhouette, mediastinal and hilar contours are within
normal limits. The lungs are clear. No pleural effusions. No
pulmonary lesions. The bony thorax is intact.
IMPRESSION: No acute cardiopulmonary findings.

## 2020-10-14 SURGERY — LEFT HEART CATH AND CORONARY ANGIOGRAPHY
Anesthesia: LOCAL

## 2020-10-14 MED ORDER — NOREPINEPHRINE 4 MG/250ML-% IV SOLN
0.0000 ug/min | INTRAVENOUS | Status: DC
  Filled 2020-10-14: qty 250

## 2020-10-14 MED ORDER — METOPROLOL TARTRATE 12.5 MG HALF TABLET: 12.5000 mg | ORAL_TABLET | Freq: Once | ORAL | Status: DC

## 2020-10-14 MED ORDER — HEPARIN (PORCINE) IN NACL 1000-0.9 UT/500ML-% IV SOLN
INTRAVENOUS | Status: AC
  Filled 2020-10-14: qty 1000

## 2020-10-14 MED ORDER — SODIUM CHLORIDE 0.9 % WEIGHT BASED INFUSION: 1.0000 mL/kg/h | INTRAVENOUS | Status: DC

## 2020-10-14 MED ORDER — TRANEXAMIC ACID (OHS) PUMP PRIME SOLUTION
2.0000 mg/kg | INTRAVENOUS | Status: DC
  Filled 2020-10-14: qty 2

## 2020-10-14 MED ORDER — PLASMA-LYTE 148 IV SOLN
INTRAVENOUS | Status: DC
  Filled 2020-10-14: qty 2.5

## 2020-10-14 MED ORDER — ACETAMINOPHEN 325 MG PO TABS: 650.0000 mg | ORAL_TABLET | ORAL | Status: DC | PRN

## 2020-10-14 MED ORDER — VANCOMYCIN HCL 1500 MG/300ML IV SOLN
1500.0000 mg | INTRAVENOUS | Status: DC
  Filled 2020-10-14 (×3): qty 300

## 2020-10-14 MED ORDER — HEPARIN SODIUM (PORCINE) 1000 UNIT/ML IJ SOLN
INTRAMUSCULAR | Status: AC
  Filled 2020-10-14: qty 1

## 2020-10-14 MED ORDER — SODIUM CHLORIDE 0.9 % IV SOLN
750.0000 mg | INTRAVENOUS | Status: DC
  Filled 2020-10-14 (×2): qty 750

## 2020-10-14 MED ORDER — ASPIRIN 81 MG PO CHEW: 81.0000 mg | CHEWABLE_TABLET | Freq: Every day | ORAL | Status: DC

## 2020-10-14 MED ORDER — TRANEXAMIC ACID 1000 MG/10ML IV SOLN
1.5000 mg/kg/h | INTRAVENOUS | Status: AC
  Administered 2020-10-15: 1.5 mg/kg/h via INTRAVENOUS
  Filled 2020-10-14: qty 25

## 2020-10-14 MED ORDER — SODIUM CHLORIDE 0.9 % IV SOLN: 250.0000 mL | INTRAVENOUS | Status: DC | PRN

## 2020-10-14 MED ORDER — HEPARIN SODIUM (PORCINE) 1000 UNIT/ML IJ SOLN
INTRAMUSCULAR | Status: DC | PRN
  Administered 2020-10-14: 5000 [IU] via INTRAVENOUS

## 2020-10-14 MED ORDER — RANOLAZINE ER 500 MG PO TB12
1000.0000 mg | ORAL_TABLET | Freq: Two times a day (BID) | ORAL | Status: DC
  Administered 2020-10-14: 1000 mg via ORAL
  Filled 2020-10-14: qty 2

## 2020-10-14 MED ORDER — PLASMA-LYTE 148 IV SOLN
INTRAVENOUS | Status: DC
  Filled 2020-10-14 (×2): qty 2.5

## 2020-10-14 MED ORDER — TRANEXAMIC ACID 1000 MG/10ML IV SOLN
1.5000 mg/kg/h | INTRAVENOUS | Status: DC
  Filled 2020-10-14 (×2): qty 25

## 2020-10-14 MED ORDER — SODIUM CHLORIDE 0.9% FLUSH: 3.0000 mL | INTRAVENOUS | Status: DC | PRN

## 2020-10-14 MED ORDER — ONDANSETRON HCL 4 MG/2ML IJ SOLN: 4.0000 mg | Freq: Four times a day (QID) | INTRAMUSCULAR | Status: DC | PRN

## 2020-10-14 MED ORDER — FENTANYL CITRATE (PF) 100 MCG/2ML IJ SOLN
INTRAMUSCULAR | Status: AC
  Filled 2020-10-14: qty 2

## 2020-10-14 MED ORDER — ISOSORBIDE MONONITRATE ER 60 MG PO TB24
60.0000 mg | ORAL_TABLET | Freq: Every day | ORAL | Status: DC
  Filled 2020-10-14: qty 1

## 2020-10-14 MED ORDER — DEXMEDETOMIDINE HCL IN NACL 400 MCG/100ML IV SOLN
0.1000 ug/kg/h | INTRAVENOUS | Status: DC
  Filled 2020-10-14 (×2): qty 100

## 2020-10-14 MED ORDER — SODIUM CHLORIDE 0.9 % IV SOLN
INTRAVENOUS | Status: DC
  Administered 2020-10-14: 125 mL/h via INTRAVENOUS

## 2020-10-14 MED ORDER — SODIUM CHLORIDE 0.9 % IV SOLN
INTRAVENOUS | Status: DC
  Filled 2020-10-14 (×2): qty 30

## 2020-10-14 MED ORDER — DEXMEDETOMIDINE HCL IN NACL 400 MCG/100ML IV SOLN
0.1000 ug/kg/h | INTRAVENOUS | Status: AC
  Administered 2020-10-15: .7 ug/kg/h via INTRAVENOUS
  Filled 2020-10-14: qty 100

## 2020-10-14 MED ORDER — PHENYLEPHRINE HCL-NACL 20-0.9 MG/250ML-% IV SOLN
30.0000 ug/min | INTRAVENOUS | Status: DC
  Filled 2020-10-14 (×2): qty 250

## 2020-10-14 MED ORDER — VANCOMYCIN HCL 1500 MG/300ML IV SOLN
1500.0000 mg | INTRAVENOUS | Status: AC
  Administered 2020-10-15: 1500 mg via INTRAVENOUS
  Filled 2020-10-14: qty 300

## 2020-10-14 MED ORDER — LIDOCAINE HCL (PF) 1 % IJ SOLN
INTRAMUSCULAR | Status: DC | PRN
  Administered 2020-10-14: 2 mL

## 2020-10-14 MED ORDER — ASPIRIN EC 81 MG PO TBEC: 81.0000 mg | DELAYED_RELEASE_TABLET | Freq: Every morning | ORAL | Status: DC

## 2020-10-14 MED ORDER — SODIUM CHLORIDE 0.9 % IV SOLN
INTRAVENOUS | Status: DC
  Filled 2020-10-14: qty 30

## 2020-10-14 MED ORDER — EPINEPHRINE HCL 5 MG/250ML IV SOLN IN NS
0.0000 ug/min | INTRAVENOUS | Status: DC
  Filled 2020-10-14: qty 250

## 2020-10-14 MED ORDER — TRANEXAMIC ACID (OHS) PUMP PRIME SOLUTION
2.0000 mg/kg | INTRAVENOUS | Status: DC
  Filled 2020-10-14 (×2): qty 2

## 2020-10-14 MED ORDER — SODIUM CHLORIDE 0.9 % IV SOLN
1.5000 g | INTRAVENOUS | Status: DC
  Filled 2020-10-14 (×2): qty 1.5

## 2020-10-14 MED ORDER — EPINEPHRINE HCL 5 MG/250ML IV SOLN IN NS
0.0000 ug/min | INTRAVENOUS | Status: DC
  Filled 2020-10-14 (×2): qty 250

## 2020-10-14 MED ORDER — MIDAZOLAM HCL 2 MG/2ML IJ SOLN
INTRAMUSCULAR | Status: DC | PRN
  Administered 2020-10-14: 2 mg via INTRAVENOUS

## 2020-10-14 MED ORDER — POTASSIUM CHLORIDE 2 MEQ/ML IV SOLN
80.0000 meq | INTRAVENOUS | Status: DC
  Filled 2020-10-14 (×2): qty 40

## 2020-10-14 MED ORDER — MILRINONE LACTATE IN DEXTROSE 20-5 MG/100ML-% IV SOLN
0.3000 ug/kg/min | INTRAVENOUS | Status: DC
  Filled 2020-10-14 (×2): qty 100

## 2020-10-14 MED ORDER — VERAPAMIL HCL 2.5 MG/ML IV SOLN
INTRAVENOUS | Status: AC
  Filled 2020-10-14: qty 2

## 2020-10-14 MED ORDER — POTASSIUM CHLORIDE 2 MEQ/ML IV SOLN
80.0000 meq | INTRAVENOUS | Status: DC
  Filled 2020-10-14: qty 40

## 2020-10-14 MED ORDER — TRANEXAMIC ACID (OHS) BOLUS VIA INFUSION
15.0000 mg/kg | INTRAVENOUS | Status: DC
  Filled 2020-10-14 (×2): qty 1497

## 2020-10-14 MED ORDER — HYDRALAZINE HCL 20 MG/ML IJ SOLN
10.0000 mg | INTRAMUSCULAR | Status: AC | PRN
Start: 2020-10-14 — End: 2020-10-14

## 2020-10-14 MED ORDER — CHLORHEXIDINE GLUCONATE CLOTH 2 % EX PADS
6.0000 | MEDICATED_PAD | Freq: Once | CUTANEOUS | Status: AC
  Administered 2020-10-14: 6 via TOPICAL

## 2020-10-14 MED ORDER — AMLODIPINE BESYLATE 5 MG PO TABS: 5.0000 mg | ORAL_TABLET | Freq: Every day | ORAL | Status: DC

## 2020-10-14 MED ORDER — MILRINONE LACTATE IN DEXTROSE 20-5 MG/100ML-% IV SOLN
0.3000 ug/kg/min | INTRAVENOUS | Status: AC
  Administered 2020-10-15: .25 ug/kg/min via INTRAVENOUS
  Filled 2020-10-14: qty 100

## 2020-10-14 MED ORDER — MIDAZOLAM HCL 2 MG/2ML IJ SOLN
INTRAMUSCULAR | Status: AC
  Filled 2020-10-14: qty 2

## 2020-10-14 MED ORDER — IOHEXOL 350 MG/ML SOLN
INTRAVENOUS | Status: DC | PRN
  Administered 2020-10-14: 75 mL via INTRA_ARTERIAL

## 2020-10-14 MED ORDER — TRANEXAMIC ACID (OHS) BOLUS VIA INFUSION
15.0000 mg/kg | INTRAVENOUS | Status: AC
  Administered 2020-10-15: 1497 mg via INTRAVENOUS
  Filled 2020-10-14: qty 1497

## 2020-10-14 MED ORDER — NITROGLYCERIN IN D5W 200-5 MCG/ML-% IV SOLN
2.0000 ug/min | INTRAVENOUS | Status: AC
  Administered 2020-10-15: 16.6 ug/min via INTRAVENOUS
  Filled 2020-10-14: qty 250

## 2020-10-14 MED ORDER — SODIUM CHLORIDE 0.9 % IV SOLN
1.5000 g | INTRAVENOUS | Status: AC
  Administered 2020-10-15: 1.5 g via INTRAVENOUS
  Filled 2020-10-14: qty 1.5

## 2020-10-14 MED ORDER — LABETALOL HCL 5 MG/ML IV SOLN: 10.0000 mg | INTRAVENOUS | Status: AC | PRN

## 2020-10-14 MED ORDER — HEPARIN (PORCINE) 25000 UT/250ML-% IV SOLN
1300.0000 [IU]/h | INTRAVENOUS | Status: DC
  Administered 2020-10-14: 1050 [IU]/h via INTRAVENOUS
  Filled 2020-10-14 (×2): qty 250

## 2020-10-14 MED ORDER — VERAPAMIL HCL 2.5 MG/ML IV SOLN
INTRAVENOUS | Status: DC | PRN
  Administered 2020-10-14: 10 mL via INTRA_ARTERIAL

## 2020-10-14 MED ORDER — NOREPINEPHRINE 4 MG/250ML-% IV SOLN
0.0000 ug/min | INTRAVENOUS | Status: DC
  Filled 2020-10-14 (×2): qty 250

## 2020-10-14 MED ORDER — FENTANYL CITRATE (PF) 100 MCG/2ML IJ SOLN
INTRAMUSCULAR | Status: DC | PRN
  Administered 2020-10-14: 25 ug via INTRAVENOUS

## 2020-10-14 MED ORDER — LIDOCAINE HCL (PF) 1 % IJ SOLN
INTRAMUSCULAR | Status: AC
  Filled 2020-10-14: qty 30

## 2020-10-14 MED ORDER — BISACODYL 5 MG PO TBEC
5.0000 mg | DELAYED_RELEASE_TABLET | Freq: Once | ORAL | Status: AC
  Administered 2020-10-14: 5 mg via ORAL
  Filled 2020-10-14: qty 1

## 2020-10-14 MED ORDER — CHLORHEXIDINE GLUCONATE 0.12 % MT SOLN
15.0000 mL | Freq: Once | OROMUCOSAL | Status: AC
  Administered 2020-10-15: 15 mL via OROMUCOSAL
  Filled 2020-10-14: qty 15

## 2020-10-14 MED ORDER — SODIUM CHLORIDE 0.9 % IV SOLN
750.0000 mg | INTRAVENOUS | Status: AC
  Administered 2020-10-15: 750 mg via INTRAVENOUS
  Filled 2020-10-14: qty 750

## 2020-10-14 MED ORDER — SODIUM CHLORIDE 0.9% FLUSH
3.0000 mL | Freq: Two times a day (BID) | INTRAVENOUS | Status: DC
  Administered 2020-10-14: 3 mL via INTRAVENOUS

## 2020-10-14 MED ORDER — HEPARIN (PORCINE) IN NACL 1000-0.9 UT/500ML-% IV SOLN
INTRAVENOUS | Status: DC | PRN
  Administered 2020-10-14 (×2): 500 mL

## 2020-10-14 MED ORDER — PHENYLEPHRINE HCL-NACL 20-0.9 MG/250ML-% IV SOLN
30.0000 ug/min | INTRAVENOUS | Status: AC
  Administered 2020-10-15: 50 ug/min via INTRAVENOUS
  Filled 2020-10-14: qty 250

## 2020-10-14 MED ORDER — NITROGLYCERIN IN D5W 200-5 MCG/ML-% IV SOLN
2.0000 ug/min | INTRAVENOUS | Status: DC
  Filled 2020-10-14 (×2): qty 250

## 2020-10-14 MED ORDER — ISOSORBIDE MONONITRATE ER 60 MG PO TB24: 120.0000 mg | ORAL_TABLET | Freq: Every day | ORAL | Status: DC

## 2020-10-14 MED ORDER — INSULIN REGULAR(HUMAN) IN NACL 100-0.9 UT/100ML-% IV SOLN
INTRAVENOUS | Status: DC
  Filled 2020-10-14: qty 100

## 2020-10-14 MED ORDER — INSULIN REGULAR(HUMAN) IN NACL 100-0.9 UT/100ML-% IV SOLN
INTRAVENOUS | Status: AC
  Administered 2020-10-15: .5 [IU]/h via INTRAVENOUS
  Filled 2020-10-14: qty 100

## 2020-10-14 MED ORDER — SODIUM CHLORIDE 0.9 % IV SOLN
INTRAVENOUS | Status: AC | PRN
  Administered 2020-10-14: 250 mL via INTRAVENOUS

## 2020-10-14 MED ORDER — MAGNESIUM SULFATE 50 % IJ SOLN
40.0000 meq | INTRAMUSCULAR | Status: DC
  Filled 2020-10-14 (×2): qty 9.85

## 2020-10-14 MED ORDER — TEMAZEPAM 15 MG PO CAPS: 15.0000 mg | ORAL_CAPSULE | Freq: Once | ORAL | Status: DC | PRN

## 2020-10-14 MED ORDER — SODIUM CHLORIDE 0.9 % WEIGHT BASED INFUSION
3.0000 mL/kg/h | INTRAVENOUS | Status: DC
  Administered 2020-10-14: 3 mL/kg/h via INTRAVENOUS

## 2020-10-14 MED ORDER — NITROGLYCERIN 0.4 MG SL SUBL
0.4000 mg | SUBLINGUAL_TABLET | SUBLINGUAL | Status: DC | PRN
  Administered 2020-10-14 – 2020-10-15 (×3): 0.4 mg via SUBLINGUAL
  Filled 2020-10-14 (×2): qty 1

## 2020-10-14 MED ORDER — ASPIRIN 81 MG PO CHEW: 81.0000 mg | CHEWABLE_TABLET | ORAL | Status: DC

## 2020-10-14 MED ORDER — MAGNESIUM SULFATE 50 % IJ SOLN
40.0000 meq | INTRAMUSCULAR | Status: DC
  Filled 2020-10-14: qty 9.85

## 2020-10-14 SURGICAL SUPPLY — 14 items
BAG SNAP BAND KOVER 36X36 (MISCELLANEOUS) ×2 IMPLANT
CATH INFINITI 5 FR 3DRC (CATHETERS) ×2 IMPLANT
CATH INFINITI JR4 5F (CATHETERS) ×2 IMPLANT
CATH OPTITORQUE TIG 4.0 5F (CATHETERS) ×2 IMPLANT
COVER DOME SNAP 22 D (MISCELLANEOUS) ×2 IMPLANT
DEVICE RAD COMP TR BAND LRG (VASCULAR PRODUCTS) ×2 IMPLANT
GLIDESHEATH SLEND SS 6F .021 (SHEATH) ×2 IMPLANT
GUIDEWIRE INQWIRE 1.5J.035X260 (WIRE) ×1 IMPLANT
INQWIRE 1.5J .035X260CM (WIRE) ×2
KIT HEART LEFT (KITS) ×2 IMPLANT
PACK CARDIAC CATHETERIZATION (CUSTOM PROCEDURE TRAY) ×2 IMPLANT
SHEATH PROBE COVER 6X72 (BAG) ×2 IMPLANT
TRANSDUCER W/STOPCOCK (MISCELLANEOUS) ×2 IMPLANT
TUBING CIL FLEX 10 FLL-RA (TUBING) ×2 IMPLANT

## 2020-10-14 NOTE — Progress Notes (Signed)
ANTICOAGULATION CONSULT NOTE - Initial Consult  Pharmacy Consult for heparin Indication: chest pain/ACS  Allergies  Allergen Reactions  . Niaspan [Niacin Er] Itching and Other (See Comments)    insomnia  . Statins     LFT elevavation, CK elevevation    Patient Measurements: Height: 5\' 8"  (172.7 cm) Weight: 99.8 kg (220 lb) IBW/kg (Calculated) : 68.4 Heparin Dosing Weight: 89.8 kg  Vital Signs: Temp: 98 F (36.7 C) (02/02 0648) Temp Source: Oral (02/02 0648) BP: 93/45 (02/02 0935) Pulse Rate: 43 (02/02 0935)  Labs: No results for input(s): HGB, HCT, PLT, APTT, LABPROT, INR, HEPARINUNFRC, HEPRLOWMOCWT, CREATININE, CKTOTAL, CKMB, TROPONINIHS in the last 72 hours.  Estimated Creatinine Clearance: 72.5 mL/min (by C-G formula based on SCr of 1.07 mg/dL).   Medical History: Past Medical History:  Diagnosis Date  . Arthritis    hands   . Chronic bronchitis ([place])   . Chronic sinusitis of both maxillary sinuses 04/16/2018   Sinus CT 04/25/2018 Mild mucoperiosteal thickening involving the frontoethmoidal sinuses. Otherwise largely clear sinuses with no evidence for acute sinusitis.   [name] COPD GOLD II   active smoker 04/16/2018   Spirometry 04/16/2018  FEV1 1.43 (47%)  Ratio 62  s prior rx  - 04/16/2018    try symbicort 160 2bid - PFT's  06/08/2018  FEV1 2.03 (71 % ) ratio 61  p 5 % improvement from saba p nothing prior to study with DLCO  63 % corrects to 72  % for alv volume    -  06/08/2018  After extensive coaching inhaler device,  effectiveness =    75%  (short ti/ variable flow) > continue symbicort 160 2bid   . Coronary artery disease   . Coronary artery disease involving native coronary artery of native heart with unstable angina pectoris ([place])   . DOE (dyspnea on exertion) 04/16/2018  . Essential hypertension, benign 09/23/2012   Changed acei to arb 04/16/2018 due to pseudowheeze > improved 06/08/2018   . Hernia, inguinal 09/23/2012  . Hyperlipidemia    has not tolerated statins,  would not  pursue PSK9 inhibitor as advised by Dr Claiborne Billings  . Hypertension   . Myocardial infarction ([place])   . NSTEMI (non-ST elevated myocardial infarction) ([place]) 09/23/2012  . 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 Tobacco abuse     Medications:  Scheduled:  . [START ON 10/15/2020] amLODipine  5 mg Oral Daily  . aspirin EC  81 mg Oral q morning - 10a  . isosorbide mononitrate  60-120 mg Oral See admin instructions  . ranolazine  1,000 mg Oral BID  . sodium chloride flush  3 mL Intravenous Q12H    Assessment: 62 yom who underwent cardiac cath on 2/2 finding severe multivessel CAD - plan for CABG evaluation, plavix washouted (last dose 10/10/2020). No other AC PTA.   Plan to start heparin 8 hours after sheath removed (documented on 2/2@0839 ). No s/sx of bleeding.   Goal of Therapy:  Heparin level 0.3-0.7 units/ml Monitor platelets by anticoagulation protocol: Yes   Plan:  Start heparin infusion at 1050 units/hr on 2/2@1700  Order heparin level 8 hours after start  Monitor CBC, HL, and for s/sx of bleeding F/u timing for CABG  Antonietta Jewel, PharmD, [place] Pharmacist  Phone: [phone] 10/14/2020 12:22 PM  Please check AMION for all [place] phone numbers After 10:00 PM, call [place] [phone]

## 2020-10-14 NOTE — Anesthesia Preprocedure Evaluation (Addendum)
Anesthesia Evaluation  Patient identified by MRN, date of birth, ID band Patient awake    Reviewed: Allergy & Precautions, NPO status , Patient's Chart, lab work & pertinent test results  History of Anesthesia Complications Negative for: history of anesthetic complications  Airway Mallampati: III  TM Distance: >3 FB Neck ROM: Full    Dental  (+) Dental Advisory Given, Poor Dentition, Missing   Pulmonary COPD, Current Smoker and Patient abstained from smoking.,    Pulmonary exam normal        Cardiovascular hypertension, Pt. on medications and Pt. on home beta blockers + angina at rest + CAD and + Past MI  Normal cardiovascular exam   '22 TTE - EF 55 to 60%. Left ventricular endocardial  border not optimally defined to evaluate regional wall motion. No valvulopathy reported.  '22 Carotid US - 1-39% b/l ICAS  '22 Cath -  1st Diag lesion is 90% stenosed.  Mid LAD lesion is 70% stenosed.  Prox RCA to Mid RCA lesion is 85% stenosed.  Prox RCA lesion is 80% stenosed with 30% stenosed side branch in RV Branch.  Mid RCA to Dist RCA lesion is 70% stenosed.  Ost LM lesion is 45% stenosed.  Ost RCA to Prox RCA lesion is 90% stenosed.  Ost Cx lesion is 99% stenosed.     Neuro/Psych negative neurological ROS  negative psych ROS   GI/Hepatic negative GI ROS, Neg liver ROS,   Endo/Other   Obesity   Renal/GU negative Renal ROS     Musculoskeletal  (+) Arthritis ,   Abdominal   Peds  Hematology negative hematology ROS (+)   Anesthesia Other Findings Covid test negative   Reproductive/Obstetrics                          Anesthesia Physical Anesthesia Plan  ASA: IV  Anesthesia Plan: General   Post-op Pain Management:    Induction: Intravenous  PONV Risk Score and Plan: 2 and Treatment may vary due to age or medical condition  Airway Management Planned: Oral ETT  Additional  Equipment: Arterial line, CVP, PA Cath, TEE and Ultrasound Guidance Line Placement  Intra-op Plan:   Post-operative Plan: Post-operative intubation/ventilation  Informed Consent: I have reviewed the patients History and Physical, chart, labs and discussed the procedure including the risks, benefits and alternatives for the proposed anesthesia with the patient or authorized representative who has indicated his/her understanding and acceptance.     Dental advisory given  Plan Discussed with: CRNA and Anesthesiologist  Anesthesia Plan Comments:        Anesthesia Quick Evaluation

## 2020-10-14 NOTE — Progress Notes (Signed)
TR BAND REMOVAL  LOCATION:    Right radial  DEFLATED PER PROTOCOL:    Yes.    TIME BAND OFF / DRESSING APPLIED:    1110am A clean dry dressing applied with gauze and tegaderm   SITE UPON ARRIVAL:    Level 0  SITE AFTER BAND REMOVAL:    Level 0  CIRCULATION SENSATION AND MOVEMENT:    Within Normal Limits   Yes.    COMMENTS:

## 2020-10-14 NOTE — Progress Notes (Signed)
Pre cabg has been completed.   Preliminary results in CV Proc.   Jonathan [name] 10/14/2020 2:59 PM

## 2020-10-14 NOTE — Progress Notes (Signed)
Echocardiogram 2D Echocardiogram has been performed.  Oneal Deputy Lanissa Cashen 10/14/2020, 3:43 PM

## 2020-10-14 NOTE — Progress Notes (Signed)
Attempted ABG x2 by 2 different RT was unable to get. Pt stated " that's enough".

## 2020-10-14 NOTE — Interval H&P Note (Signed)
Cath Lab Visit (complete for each Cath Lab visit)  Clinical Evaluation Leading to the Procedure:   ACS: No.  Non-ACS:    Anginal Classification: CCS IV  Anti-ischemic medical therapy: Maximal Therapy (2 or more classes of medications)  Non-Invasive Test Results: No non-invasive testing performed  Prior CABG: No previous CABG      History and Physical Interval Note:  10/14/2020 8:05 AM  Jonathan [name]  has presented today for surgery, with the diagnosis of angina.  The various methods of treatment have been discussed with the patient and family. After consideration of risks, benefits and other options for treatment, the patient has consented to  Procedure(s): LEFT HEART CATH AND CORONARY ANGIOGRAPHY (N/A) as a surgical intervention.  The patient's history has been reviewed, patient examined, no change in status, stable for surgery.  I have reviewed the patient's chart and labs.  Questions were answered to the patient's satisfaction.     Shelva Majestic

## 2020-10-14 NOTE — Progress Notes (Signed)
TCTS consulted for CABG evaluation. °

## 2020-10-15 ENCOUNTER — Inpatient Hospital Stay (HOSPITAL_COMMUNITY): Payer: Medicare Other | Admitting: Anesthesiology

## 2020-10-15 ENCOUNTER — Inpatient Hospital Stay (HOSPITAL_COMMUNITY): Payer: Medicare Other

## 2020-10-15 ENCOUNTER — Inpatient Hospital Stay (HOSPITAL_COMMUNITY)
Admission: RE | Disposition: A | Discharge status: Rehabilitation Facility | Payer: Medicare Other | Source: Home / Self Care | Attending: Cardiothoracic Surgery

## 2020-10-15 ENCOUNTER — Ambulatory Visit: Payer: Medicare Other | Admitting: Cardiovascular Disease

## 2020-10-15 DIAGNOSIS — Z951 Presence of aortocoronary bypass graft: Secondary | ICD-10-CM

## 2020-10-15 DIAGNOSIS — I25110 Atherosclerotic heart disease of native coronary artery with unstable angina pectoris: Secondary | ICD-10-CM

## 2020-10-15 HISTORY — PX: TEE WITHOUT CARDIOVERSION: SHX5443

## 2020-10-15 HISTORY — PX: ENDOVEIN HARVEST OF GREATER SAPHENOUS VEIN: SHX5059

## 2020-10-15 HISTORY — PX: CORONARY ARTERY BYPASS GRAFT: SHX141

## 2020-10-15 LAB — COMPREHENSIVE METABOLIC PANEL
ALT: 13 U/L (ref 0–44)
AST: 19 U/L (ref 15–41)
Albumin: 2.9 g/dL — ABNORMAL LOW (ref 3.5–5.0)
Alkaline Phosphatase: 40 U/L (ref 38–126)
Anion gap: 8 (ref 5–15)
BUN: 13 mg/dL (ref 8–23)
CO2: 25 mmol/L (ref 22–32)
Calcium: 8.3 mg/dL — ABNORMAL LOW (ref 8.9–10.3)
Chloride: 103 mmol/L (ref 98–111)
Creatinine, Ser: 1.45 mg/dL — ABNORMAL HIGH (ref 0.61–1.24)
GFR, Estimated: 52 mL/min — ABNORMAL LOW (ref >60)
Glucose, Bld: 97 mg/dL (ref 70–99)
Potassium: 5.1 mmol/L (ref 3.5–5.1)
Sodium: 136 mmol/L (ref 135–145)
Total Bilirubin: 0.7 mg/dL (ref 0.3–1.2)
Total Protein: 5.6 g/dL — ABNORMAL LOW (ref 6.5–8.1)

## 2020-10-15 LAB — POCT I-STAT, CHEM 8
BUN: 13 mg/dL (ref 8–23)
BUN: 13 mg/dL (ref 8–23)
BUN: 12 mg/dL (ref 8–23)
BUN: 13 mg/dL (ref 8–23)
BUN: 13 mg/dL (ref 8–23)
Calcium, Ion: 1.21 mmol/L (ref 1.15–1.40)
Calcium, Ion: 1.15 mmol/L (ref 1.15–1.40)
Calcium, Ion: 0.98 mmol/L — ABNORMAL LOW (ref 1.15–1.40)
Calcium, Ion: 1.01 mmol/L — ABNORMAL LOW (ref 1.15–1.40)
Calcium, Ion: 1.27 mmol/L (ref 1.15–1.40)
Chloride: 102 mmol/L (ref 98–111)
Chloride: 101 mmol/L (ref 98–111)
Chloride: 101 mmol/L (ref 98–111)
Chloride: 100 mmol/L (ref 98–111)
Chloride: 102 mmol/L (ref 98–111)
Creatinine, Ser: 1 mg/dL (ref 0.61–1.24)
Creatinine, Ser: 1 mg/dL (ref 0.61–1.24)
Creatinine, Ser: 0.8 mg/dL (ref 0.61–1.24)
Creatinine, Ser: 0.9 mg/dL (ref 0.61–1.24)
Creatinine, Ser: 1 mg/dL (ref 0.61–1.24)
Glucose, Bld: 93 mg/dL (ref 70–99)
Glucose, Bld: 90 mg/dL (ref 70–99)
Glucose, Bld: 86 mg/dL (ref 70–99)
Glucose, Bld: 86 mg/dL (ref 70–99)
Glucose, Bld: 92 mg/dL (ref 70–99)
HCT: 42 % (ref 39.0–52.0)
HCT: 37 % — ABNORMAL LOW (ref 39.0–52.0)
HCT: 29 % — ABNORMAL LOW (ref 39.0–52.0)
HCT: 27 % — ABNORMAL LOW (ref 39.0–52.0)
HCT: 30 % — ABNORMAL LOW (ref 39.0–52.0)
Hemoglobin: 14.3 g/dL (ref 13.0–17.0)
Hemoglobin: 12.6 g/dL — ABNORMAL LOW (ref 13.0–17.0)
Hemoglobin: 9.9 g/dL — ABNORMAL LOW (ref 13.0–17.0)
Hemoglobin: 10.2 g/dL — ABNORMAL LOW (ref 13.0–17.0)
Hemoglobin: 9.2 g/dL — ABNORMAL LOW (ref 13.0–17.0)
Potassium: 4.4 mmol/L (ref 3.5–5.1)
Potassium: 4.8 mmol/L (ref 3.5–5.1)
Potassium: 5.6 mmol/L — ABNORMAL HIGH (ref 3.5–5.1)
Potassium: 4.4 mmol/L (ref 3.5–5.1)
Potassium: 5.1 mmol/L (ref 3.5–5.1)
Sodium: 137 mmol/L (ref 135–145)
Sodium: 135 mmol/L (ref 135–145)
Sodium: 135 mmol/L (ref 135–145)
Sodium: 134 mmol/L — ABNORMAL LOW (ref 135–145)
Sodium: 136 mmol/L (ref 135–145)
TCO2: 27 mmol/L (ref 22–32)
TCO2: 26 mmol/L (ref 22–32)
TCO2: 26 mmol/L (ref 22–32)
TCO2: 26 mmol/L (ref 22–32)
TCO2: 27 mmol/L (ref 22–32)

## 2020-10-15 LAB — HEMOGLOBIN A1C
Hgb A1c MFr Bld: 5.6 % (ref 4.8–5.6)
Mean Plasma Glucose: 114.02 mg/dL

## 2020-10-15 LAB — POCT I-STAT 7, (LYTES, BLD GAS, ICA,H+H)
Acid-Base Excess: 1 mmol/L (ref 0.0–2.0)
Acid-Base Excess: 3 mmol/L — ABNORMAL HIGH (ref 0.0–2.0)
Acid-Base Excess: 0 mmol/L (ref 0.0–2.0)
Acid-base deficit: 1 mmol/L (ref 0.0–2.0)
Acid-base deficit: 3 mmol/L — ABNORMAL HIGH (ref 0.0–2.0)
Acid-base deficit: 5 mmol/L — ABNORMAL HIGH (ref 0.0–2.0)
Bicarbonate: 27.2 mmol/L (ref 20.0–28.0)
Bicarbonate: 26.5 mmol/L (ref 20.0–28.0)
Bicarbonate: 26.8 mmol/L (ref 20.0–28.0)
Bicarbonate: 25.5 mmol/L (ref 20.0–28.0)
Bicarbonate: 25.4 mmol/L (ref 20.0–28.0)
Bicarbonate: 23.3 mmol/L (ref 20.0–28.0)
Calcium, Ion: 1.2 mmol/L (ref 1.15–1.40)
Calcium, Ion: 0.94 mmol/L — ABNORMAL LOW (ref 1.15–1.40)
Calcium, Ion: 0.97 mmol/L — ABNORMAL LOW (ref 1.15–1.40)
Calcium, Ion: 1.25 mmol/L (ref 1.15–1.40)
Calcium, Ion: 1.2 mmol/L (ref 1.15–1.40)
Calcium, Ion: 1.17 mmol/L (ref 1.15–1.40)
HCT: 41 % (ref 39.0–52.0)
HCT: 28 % — ABNORMAL LOW (ref 39.0–52.0)
HCT: 28 % — ABNORMAL LOW (ref 39.0–52.0)
HCT: 28 % — ABNORMAL LOW (ref 39.0–52.0)
HCT: 35 % — ABNORMAL LOW (ref 39.0–52.0)
HCT: 38 % — ABNORMAL LOW (ref 39.0–52.0)
Hemoglobin: 13.9 g/dL (ref 13.0–17.0)
Hemoglobin: 9.5 g/dL — ABNORMAL LOW (ref 13.0–17.0)
Hemoglobin: 9.5 g/dL — ABNORMAL LOW (ref 13.0–17.0)
Hemoglobin: 9.5 g/dL — ABNORMAL LOW (ref 13.0–17.0)
Hemoglobin: 11.9 g/dL — ABNORMAL LOW (ref 13.0–17.0)
Hemoglobin: 12.9 g/dL — ABNORMAL LOW (ref 13.0–17.0)
O2 Saturation: 100 %
O2 Saturation: 100 %
O2 Saturation: 100 %
O2 Saturation: 99 %
O2 Saturation: 97 %
O2 Saturation: 94 %
Patient temperature: 36
Patient temperature: 36
Potassium: 4.3 mmol/L (ref 3.5–5.1)
Potassium: 5 mmol/L (ref 3.5–5.1)
Potassium: 5.5 mmol/L — ABNORMAL HIGH (ref 3.5–5.1)
Potassium: 4.4 mmol/L (ref 3.5–5.1)
Potassium: 4.7 mmol/L (ref 3.5–5.1)
Potassium: 4.9 mmol/L (ref 3.5–5.1)
Sodium: 137 mmol/L (ref 135–145)
Sodium: 136 mmol/L (ref 135–145)
Sodium: 135 mmol/L (ref 135–145)
Sodium: 137 mmol/L (ref 135–145)
Sodium: 138 mmol/L (ref 135–145)
Sodium: 137 mmol/L (ref 135–145)
TCO2: 29 mmol/L (ref 22–32)
TCO2: 28 mmol/L (ref 22–32)
TCO2: 28 mmol/L (ref 22–32)
TCO2: 27 mmol/L (ref 22–32)
TCO2: 27 mmol/L (ref 22–32)
TCO2: 25 mmol/L (ref 22–32)
pCO2 arterial: 57.9 mmHg — ABNORMAL HIGH (ref 32.0–48.0)
pCO2 arterial: 47.3 mmHg (ref 32.0–48.0)
pCO2 arterial: 38 mmHg (ref 32.0–48.0)
pCO2 arterial: 47 mmHg (ref 32.0–48.0)
pCO2 arterial: 55.6 mmHg — ABNORMAL HIGH (ref 32.0–48.0)
pCO2 arterial: 52.3 mmHg — ABNORMAL HIGH (ref 32.0–48.0)
pH, Arterial: 7.281 — ABNORMAL LOW (ref 7.350–7.450)
pH, Arterial: 7.356 (ref 7.350–7.450)
pH, Arterial: 7.457 — ABNORMAL HIGH (ref 7.350–7.450)
pH, Arterial: 7.342 — ABNORMAL LOW (ref 7.350–7.450)
pH, Arterial: 7.263 — ABNORMAL LOW (ref 7.350–7.450)
pH, Arterial: 7.252 — ABNORMAL LOW (ref 7.350–7.450)
pO2, Arterial: 266 mmHg — ABNORMAL HIGH (ref 83.0–108.0)
pO2, Arterial: 381 mmHg — ABNORMAL HIGH (ref 83.0–108.0)
pO2, Arterial: 267 mmHg — ABNORMAL HIGH (ref 83.0–108.0)
pO2, Arterial: 129 mmHg — ABNORMAL HIGH (ref 83.0–108.0)
pO2, Arterial: 98 mmHg (ref 83.0–108.0)
pO2, Arterial: 78 mmHg — ABNORMAL LOW (ref 83.0–108.0)

## 2020-10-15 LAB — CBC
HCT: 40.4 % (ref 39.0–52.0)
HCT: 36.4 % — ABNORMAL LOW (ref 39.0–52.0)
HCT: 37.8 % — ABNORMAL LOW (ref 39.0–52.0)
Hemoglobin: 13.2 g/dL (ref 13.0–17.0)
Hemoglobin: 12.4 g/dL — ABNORMAL LOW (ref 13.0–17.0)
Hemoglobin: 12.8 g/dL — ABNORMAL LOW (ref 13.0–17.0)
MCH: 32.8 pg (ref 26.0–34.0)
MCH: 34.3 pg — ABNORMAL HIGH (ref 26.0–34.0)
MCH: 34.1 pg — ABNORMAL HIGH (ref 26.0–34.0)
MCHC: 32.7 g/dL (ref 30.0–36.0)
MCHC: 34.1 g/dL (ref 30.0–36.0)
MCHC: 33.9 g/dL (ref 30.0–36.0)
MCV: 100.5 fL — ABNORMAL HIGH (ref 80.0–100.0)
MCV: 100.6 fL — ABNORMAL HIGH (ref 80.0–100.0)
MCV: 100.8 fL — ABNORMAL HIGH (ref 80.0–100.0)
Platelets: 193 10*3/uL (ref 150–400)
Platelets: 145 10*3/uL — ABNORMAL LOW (ref 150–400)
Platelets: 168 10*3/uL (ref 150–400)
RBC: 4.02 MIL/uL — ABNORMAL LOW (ref 4.22–5.81)
RBC: 3.62 MIL/uL — ABNORMAL LOW (ref 4.22–5.81)
RBC: 3.75 MIL/uL — ABNORMAL LOW (ref 4.22–5.81)
RDW: 12.8 % (ref 11.5–15.5)
RDW: 12.8 % (ref 11.5–15.5)
RDW: 12.6 % (ref 11.5–15.5)
WBC: 7.7 10*3/uL (ref 4.0–10.5)
WBC: 13.5 10*3/uL — ABNORMAL HIGH (ref 4.0–10.5)
WBC: 15.6 10*3/uL — ABNORMAL HIGH (ref 4.0–10.5)
nRBC: 0 % (ref 0.0–0.2)
nRBC: 0 % (ref 0.0–0.2)
nRBC: 0 % (ref 0.0–0.2)

## 2020-10-15 LAB — TYPE AND SCREEN
ABO/RH(D): B POS
ABO/RH(D): B POS
Antibody Screen: NEGATIVE
Antibody Screen: NEGATIVE

## 2020-10-15 LAB — HEMOGLOBIN AND HEMATOCRIT, BLOOD
HCT: 29.3 % — ABNORMAL LOW (ref 39.0–52.0)
Hemoglobin: 9.5 g/dL — ABNORMAL LOW (ref 13.0–17.0)

## 2020-10-15 LAB — BASIC METABOLIC PANEL
Anion gap: 9 (ref 5–15)
BUN: 11 mg/dL (ref 8–23)
CO2: 23 mmol/L (ref 22–32)
Calcium: 7.7 mg/dL — ABNORMAL LOW (ref 8.9–10.3)
Chloride: 105 mmol/L (ref 98–111)
Creatinine, Ser: 1.27 mg/dL — ABNORMAL HIGH (ref 0.61–1.24)
GFR, Estimated: >60 mL/min (ref >60)
Glucose, Bld: 114 mg/dL — ABNORMAL HIGH (ref 70–99)
Potassium: 4.7 mmol/L (ref 3.5–5.1)
Sodium: 137 mmol/L (ref 135–145)

## 2020-10-15 LAB — ECHO INTRAOPERATIVE TEE
Height: 68 in
Weight: 3520 oz

## 2020-10-15 LAB — MAGNESIUM: Magnesium: 2.9 mg/dL — ABNORMAL HIGH (ref 1.7–2.4)

## 2020-10-15 LAB — POCT I-STAT EG7
Acid-Base Excess: 0 mmol/L (ref 0.0–2.0)
Bicarbonate: 26 mmol/L (ref 20.0–28.0)
Calcium, Ion: 1.06 mmol/L — ABNORMAL LOW (ref 1.15–1.40)
HCT: 29 % — ABNORMAL LOW (ref 39.0–52.0)
Hemoglobin: 9.9 g/dL — ABNORMAL LOW (ref 13.0–17.0)
O2 Saturation: 80 %
Potassium: 5.1 mmol/L (ref 3.5–5.1)
Sodium: 137 mmol/L (ref 135–145)
TCO2: 27 mmol/L (ref 22–32)
pCO2, Ven: 46.6 mmHg (ref 44.0–60.0)
pH, Ven: 7.355 (ref 7.250–7.430)
pO2, Ven: 47 mmHg — ABNORMAL HIGH (ref 32.0–45.0)

## 2020-10-15 LAB — HEPARIN LEVEL (UNFRACTIONATED): Heparin Unfractionated: <0.1 IU/mL — ABNORMAL LOW (ref 0.30–0.70)

## 2020-10-15 LAB — PROTIME-INR
INR: 1.2 (ref 0.8–1.2)
INR: 1.5 — ABNORMAL HIGH (ref 0.8–1.2)
Prothrombin Time: 14.3 seconds (ref 11.4–15.2)
Prothrombin Time: 17.9 seconds — ABNORMAL HIGH (ref 11.4–15.2)

## 2020-10-15 LAB — COOXEMETRY PANEL
Carboxyhemoglobin: 1 % (ref 0.5–1.5)
Methemoglobin: 0.9 % (ref 0.0–1.5)
O2 Saturation: 66 %
Total hemoglobin: 12.9 g/dL (ref 12.0–16.0)

## 2020-10-15 LAB — GLUCOSE, CAPILLARY
Glucose-Capillary: 90 mg/dL (ref 70–99)
Glucose-Capillary: 97 mg/dL (ref 70–99)
Glucose-Capillary: 97 mg/dL (ref 70–99)
Glucose-Capillary: 100 mg/dL — ABNORMAL HIGH (ref 70–99)
Glucose-Capillary: 77 mg/dL (ref 70–99)
Glucose-Capillary: 149 mg/dL — ABNORMAL HIGH (ref 70–99)

## 2020-10-15 LAB — PLATELET COUNT: Platelets: 142 10*3/uL — ABNORMAL LOW (ref 150–400)

## 2020-10-15 LAB — SURGICAL PCR SCREEN
MRSA, PCR: NEGATIVE
Staphylococcus aureus: NEGATIVE

## 2020-10-15 LAB — APTT
aPTT: 40 seconds — ABNORMAL HIGH (ref 24–36)
aPTT: 33 seconds (ref 24–36)

## 2020-10-15 IMAGING — CR DG CHEST 1V PORT
1 series · 1 of 1 positions shown · non-contrast
Comparison: none

CLINICAL DATA: Incorrect hemostat count.

EXAM:
PORTABLE CHEST 1 VIEW

[AP]
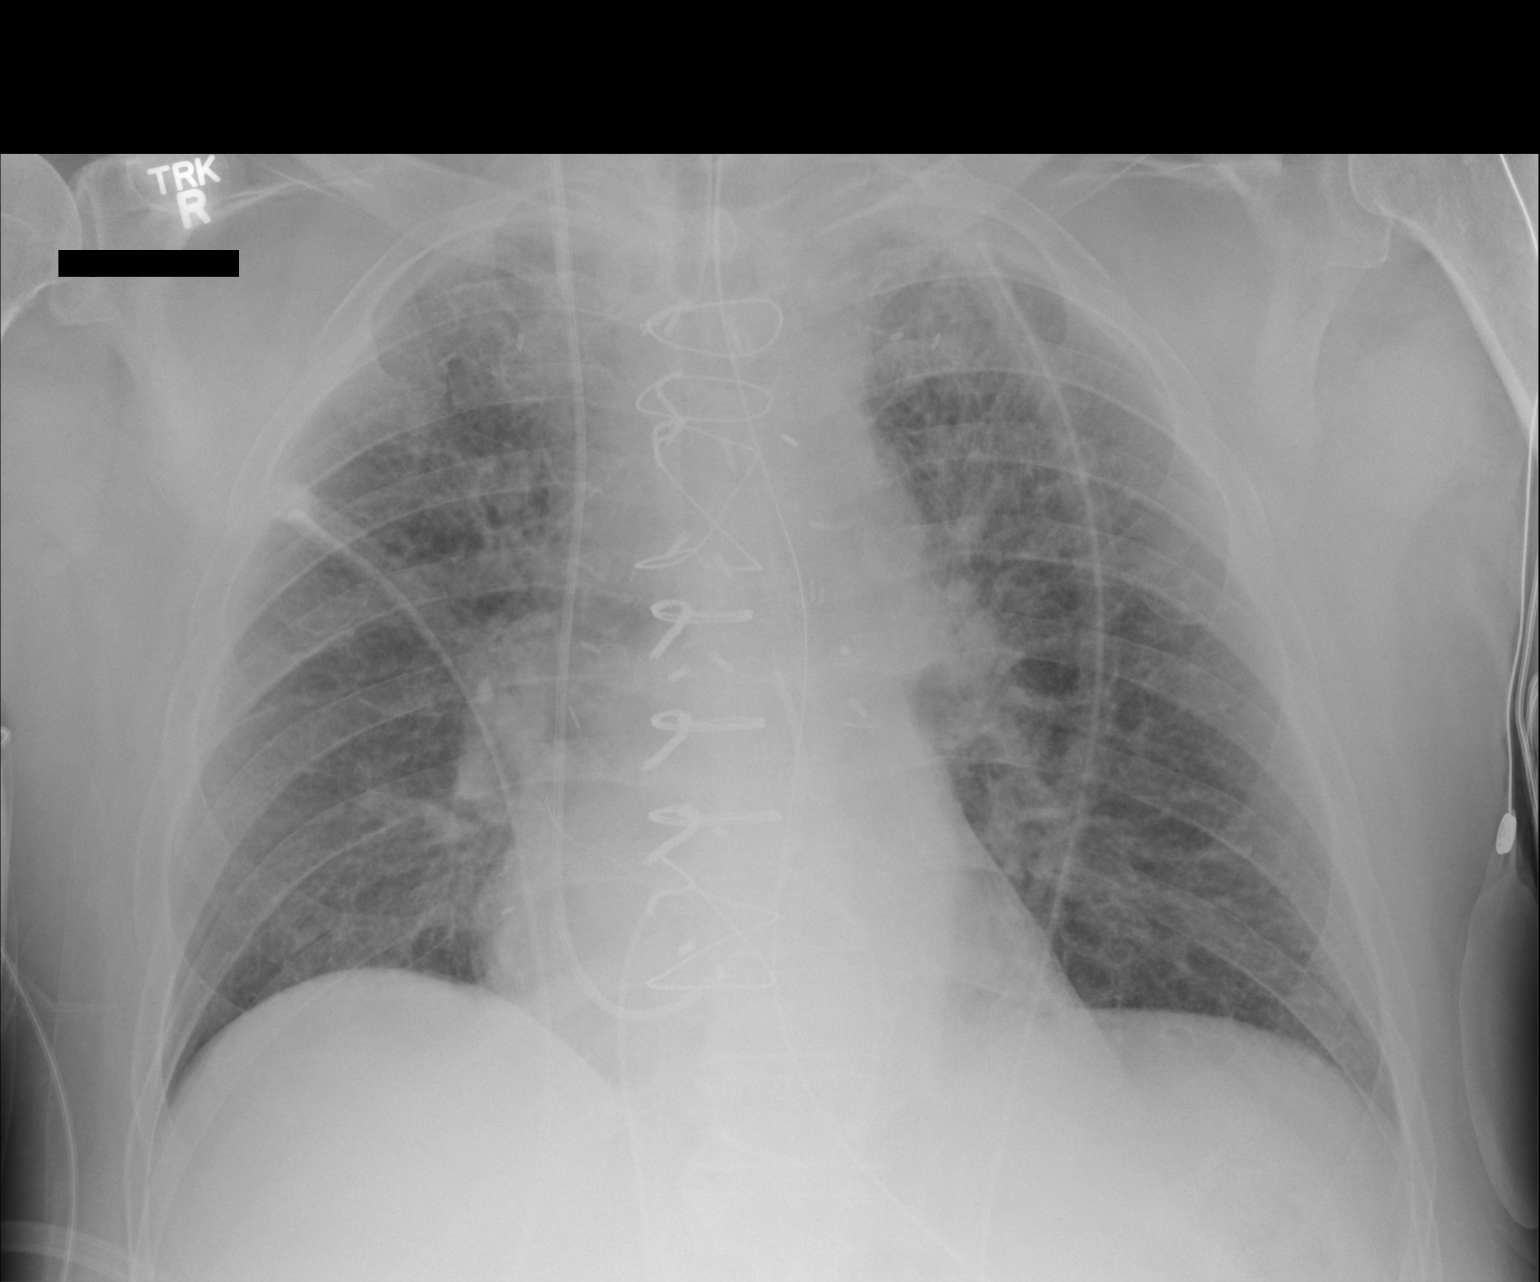

[1 of 1 positions shown; findings below may reference images not displayed]

FINDINGS: Endotracheal tube, NG tube, Swan-Ganz catheter, mediastinal drainage
catheter bilateral chest tubes in good anatomic position. Prior
CABG. Diffuse bilateral interstitial prominence, possibly
interstitial edema. No pleural effusion or pneumothorax. No evidence
of a retained hemostat.
IMPRESSION: 1. Lines and tubes in good anatomic position. Prior CABG. Bilateral
interstitial prominence. Interstitial edema could present in this
fashion. 2. No evidence of retained hemostat.

Report phoned to the operating room and given to TAYDE at the time
of this dictation.

## 2020-10-15 IMAGING — DX DG CHEST 1V PORT
1 series · 2 of 2 positions shown · non-contrast
Comparison: Chest x-ray from same day at [Y2] hours.

CLINICAL DATA: Status post CABG.

EXAM:
PORTABLE CHEST 1 VIEW

[Series 1: chest · 0.14mm/px · 2 of 2 slices shown]
[im 1/2]
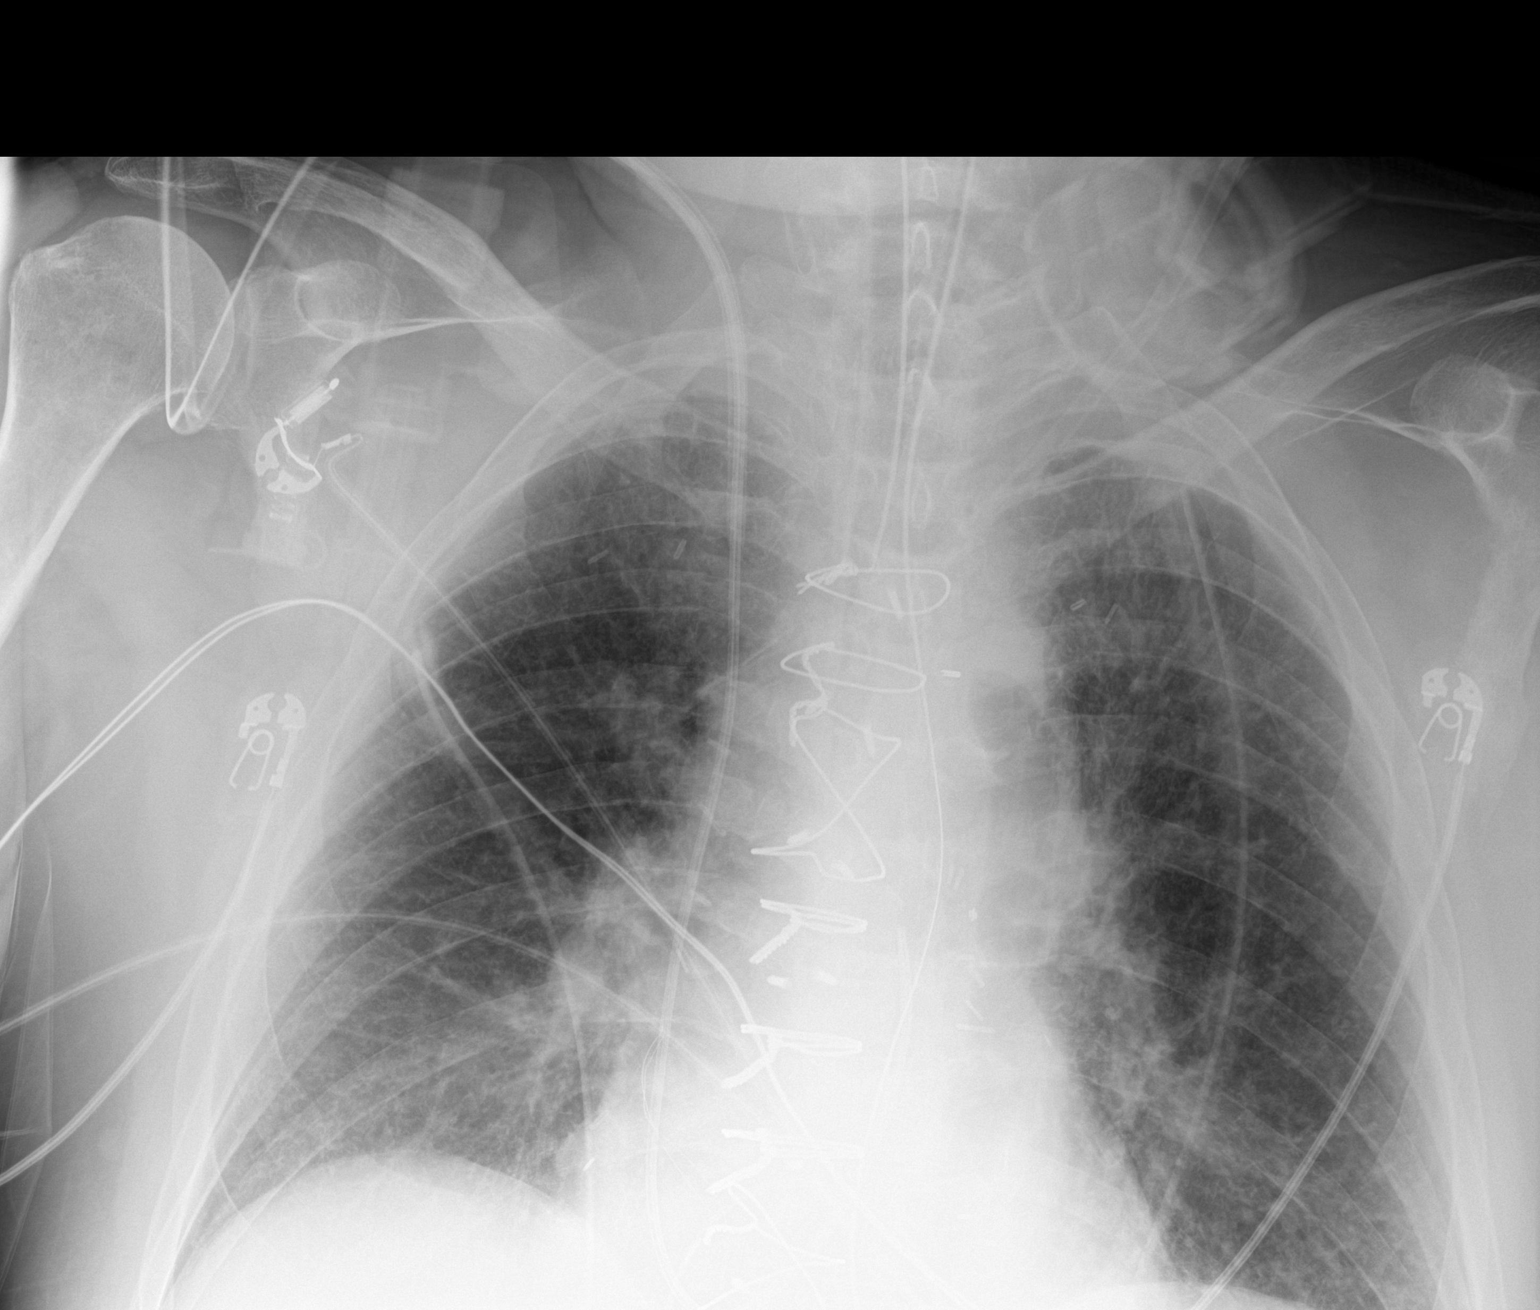
[im 2/2]
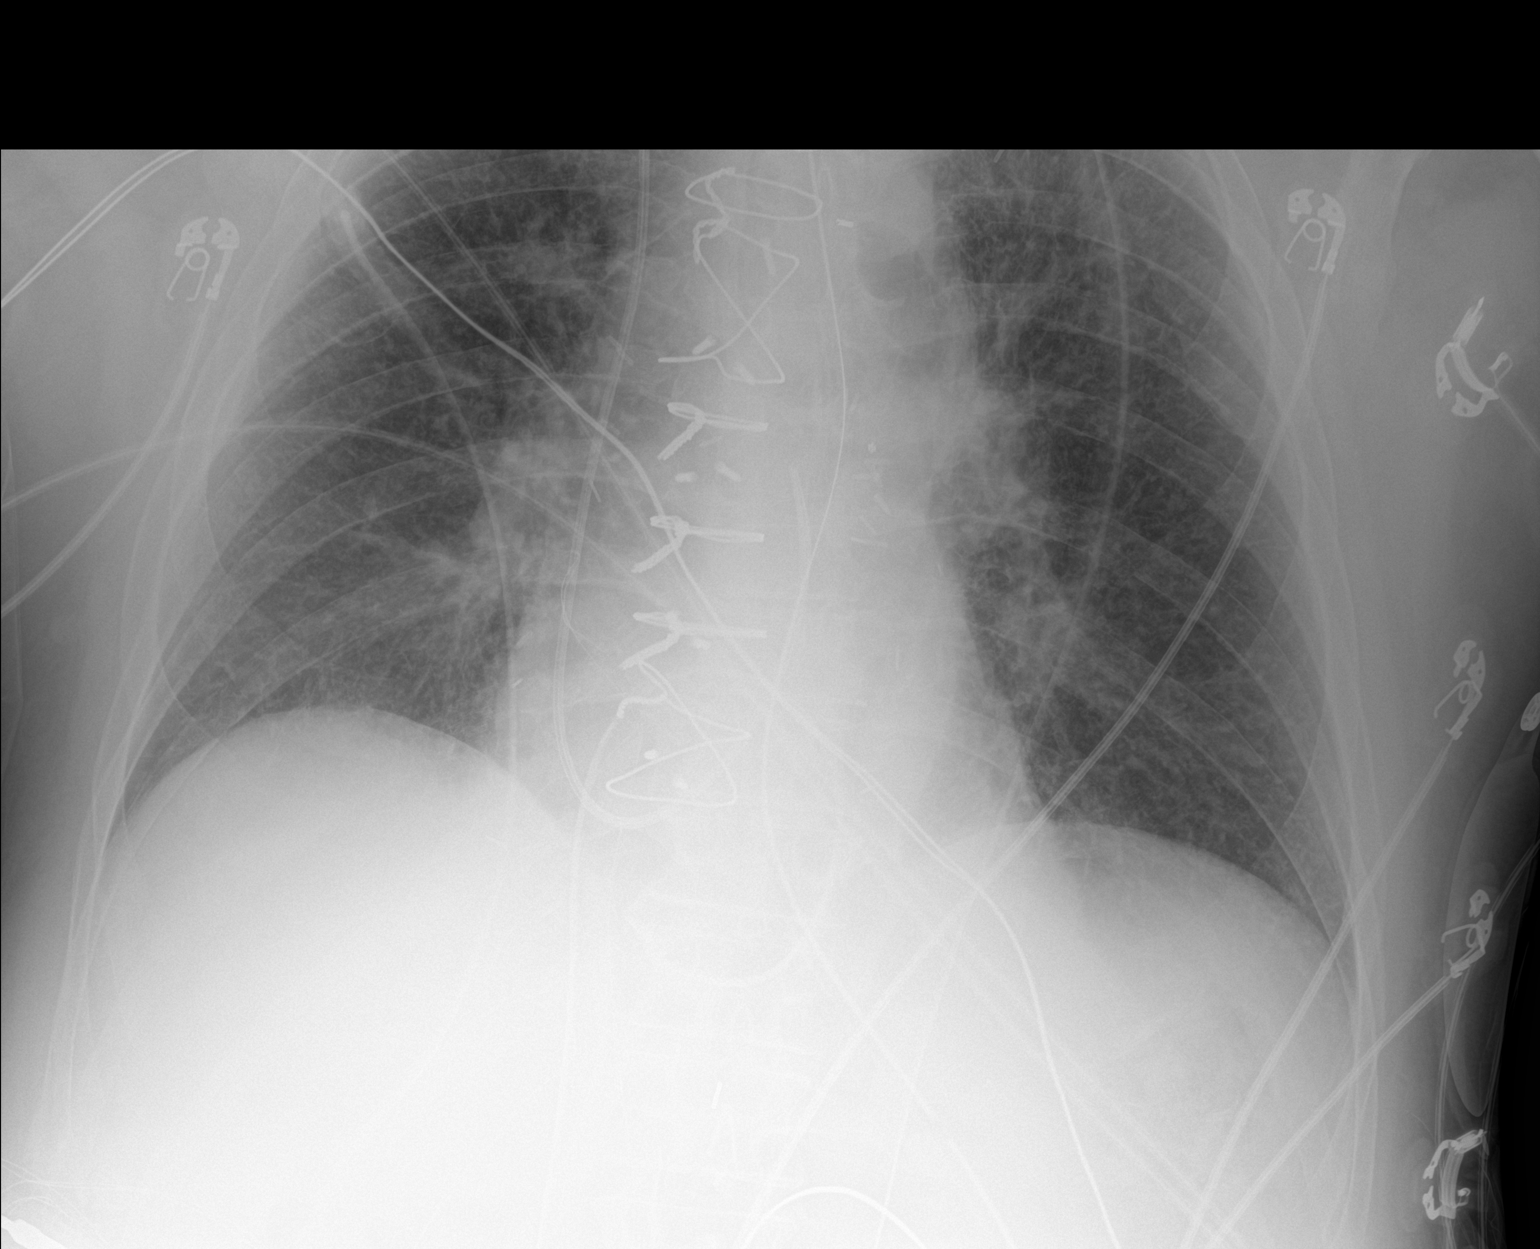

[2 of 2 positions shown; findings below may reference images not displayed]

FINDINGS: Endotracheal tube in position with the tip at the thoracic inlet,
unchanged. Enteric tube entering the stomach with the tip below the
field of view. Right internal jugular Swan-Ganz catheter with the
tip in the main pulmonary outflow tract. Bilateral chest tubes and
mediastinal drain are in good position.

Status post CABG. Normal heart size. Unchanged mild diffuse
interstitial thickening. No focal consolidation, pleural effusion,
or pneumothorax. No acute osseous abnormality.
IMPRESSION: 1. Stable support apparatus.
2. Unchanged mild interstitial pulmonary edema. No pneumothorax or
pleural effusion.

## 2020-10-15 SURGERY — CORONARY ARTERY BYPASS GRAFTING (CABG)
Anesthesia: General | Site: Chest | Laterality: Right

## 2020-10-15 MED ORDER — NOREPINEPHRINE 4 MG/250ML-% IV SOLN
0.0000 ug/min | INTRAVENOUS | Status: DC
  Administered 2020-10-15: 2 ug/min via INTRAVENOUS
  Filled 2020-10-15: qty 250

## 2020-10-15 MED ORDER — PHENYLEPHRINE HCL (PRESSORS) 10 MG/ML IV SOLN
INTRAVENOUS | Status: AC
  Filled 2020-10-15: qty 2

## 2020-10-15 MED ORDER — EPHEDRINE 5 MG/ML INJ
INTRAVENOUS | Status: AC
  Filled 2020-10-15: qty 10

## 2020-10-15 MED ORDER — MAGNESIUM SULFATE 4 GM/100ML IV SOLN
4.0000 g | Freq: Once | INTRAVENOUS | Status: AC
  Administered 2020-10-15: 4 g via INTRAVENOUS
  Filled 2020-10-15: qty 100

## 2020-10-15 MED ORDER — ACETAMINOPHEN 160 MG/5ML PO SOLN: 650.0000 mg | Freq: Once | ORAL | Status: AC

## 2020-10-15 MED ORDER — NITROGLYCERIN 0.4 MG SL SUBL
SUBLINGUAL_TABLET | SUBLINGUAL | Status: AC
  Filled 2020-10-15: qty 1

## 2020-10-15 MED ORDER — STERILE WATER FOR INJECTION IJ SOLN
INTRAMUSCULAR | Status: DC | PRN
  Administered 2020-10-15: 10 mL

## 2020-10-15 MED ORDER — GLYCOPYRROLATE 0.2 MG/ML IJ SOLN
INTRAMUSCULAR | Status: DC | PRN
  Administered 2020-10-15: .2 mg via INTRAVENOUS

## 2020-10-15 MED ORDER — ALBUMIN HUMAN 5 % IV SOLN: INTRAVENOUS | Status: DC | PRN

## 2020-10-15 MED ORDER — THROMBIN 5000 UNITS EX SOLR
INTRAVENOUS | Status: DC | PRN
  Administered 2020-10-15: 2 mL

## 2020-10-15 MED ORDER — FENTANYL CITRATE (PF) 250 MCG/5ML IJ SOLN
INTRAMUSCULAR | Status: AC
  Filled 2020-10-15: qty 5

## 2020-10-15 MED ORDER — DOCUSATE SODIUM 100 MG PO CAPS: 200.0000 mg | ORAL_CAPSULE | Freq: Every day | ORAL | Status: DC

## 2020-10-15 MED ORDER — HEMOSTATIC AGENTS (NO CHARGE) OPTIME
TOPICAL | Status: DC | PRN
  Administered 2020-10-15 (×2): 1 via TOPICAL

## 2020-10-15 MED ORDER — PLASMA-LYTE A IV SOLN: INTRAVENOUS | Status: DC

## 2020-10-15 MED ORDER — MILRINONE LACTATE IN DEXTROSE 20-5 MG/100ML-% IV SOLN
0.1250 ug/kg/min | INTRAVENOUS | Status: DC
  Administered 2020-10-16 – 2020-10-18 (×4): 0.25 ug/kg/min via INTRAVENOUS
  Administered 2020-10-18: 0.125 ug/kg/min via INTRAVENOUS
  Administered 2020-10-18: 0.25 ug/kg/min via INTRAVENOUS
  Filled 2020-10-15 (×7): qty 100

## 2020-10-15 MED ORDER — ACETAMINOPHEN 650 MG RE SUPP
650.0000 mg | Freq: Once | RECTAL | Status: AC
  Administered 2020-10-15: 650 mg via RECTAL

## 2020-10-15 MED ORDER — PHENYLEPHRINE HCL (PRESSORS) 10 MG/ML IV SOLN: INTRAVENOUS | Status: DC | PRN

## 2020-10-15 MED ORDER — 0.9 % SODIUM CHLORIDE (POUR BTL) OPTIME
TOPICAL | Status: DC | PRN
  Administered 2020-10-15: 5000 mL

## 2020-10-15 MED ORDER — PHENYLEPHRINE HCL-NACL 20-0.9 MG/250ML-% IV SOLN: 0.0000 ug/min | INTRAVENOUS | Status: DC

## 2020-10-15 MED ORDER — MORPHINE SULFATE (PF) 2 MG/ML IV SOLN
1.0000 mg | INTRAVENOUS | Status: DC | PRN
  Administered 2020-10-15 – 2020-10-16 (×5): 4 mg via INTRAVENOUS
  Administered 2020-10-16 (×4): 2 mg via INTRAVENOUS
  Administered 2020-10-17: 1 mg via INTRAVENOUS
  Administered 2020-10-18 – 2020-10-19 (×4): 2 mg via INTRAVENOUS
  Filled 2020-10-15: qty 1
  Filled 2020-10-15: qty 2
  Filled 2020-10-15: qty 1
  Filled 2020-10-15: qty 2
  Filled 2020-10-15 (×2): qty 1
  Filled 2020-10-15 (×3): qty 2
  Filled 2020-10-15: qty 1
  Filled 2020-10-15: qty 2
  Filled 2020-10-15 (×3): qty 1

## 2020-10-15 MED ORDER — POTASSIUM CHLORIDE 10 MEQ/50ML IV SOLN: 10.0000 meq | INTRAVENOUS | Status: AC

## 2020-10-15 MED ORDER — ACETAMINOPHEN 500 MG PO TABS
1000.0000 mg | ORAL_TABLET | Freq: Four times a day (QID) | ORAL | Status: DC
  Administered 2020-10-17 – 2020-10-19 (×6): 1000 mg via ORAL
  Filled 2020-10-15 (×6): qty 2

## 2020-10-15 MED ORDER — SODIUM CHLORIDE 0.9% FLUSH: 3.0000 mL | INTRAVENOUS | Status: DC | PRN

## 2020-10-15 MED ORDER — VANCOMYCIN HCL 1000 MG IV SOLR
INTRAVENOUS | Status: DC | PRN
  Administered 2020-10-15: 3000 mg

## 2020-10-15 MED ORDER — PROPOFOL 10 MG/ML IV BOLUS
INTRAVENOUS | Status: DC | PRN
  Administered 2020-10-15: 30 mg via INTRAVENOUS
  Administered 2020-10-15: 20 mg via INTRAVENOUS
  Administered 2020-10-15: 40 mg via INTRAVENOUS

## 2020-10-15 MED ORDER — GLYCOPYRROLATE PF 0.2 MG/ML IJ SOSY
PREFILLED_SYRINGE | INTRAMUSCULAR | Status: AC
  Filled 2020-10-15: qty 1

## 2020-10-15 MED ORDER — SODIUM CHLORIDE 0.45 % IV SOLN: INTRAVENOUS | Status: DC | PRN

## 2020-10-15 MED ORDER — MILRINONE LACTATE IN DEXTROSE 20-5 MG/100ML-% IV SOLN: 0.2500 ug/kg/min | INTRAVENOUS | Status: DC

## 2020-10-15 MED ORDER — VANCOMYCIN HCL 1000 MG IV SOLR
INTRAVENOUS | Status: AC
  Filled 2020-10-15: qty 3000

## 2020-10-15 MED ORDER — SODIUM CHLORIDE 0.9 % IV SOLN: 250.0000 mL | INTRAVENOUS | Status: DC

## 2020-10-15 MED ORDER — METOPROLOL TARTRATE 12.5 MG HALF TABLET
12.5000 mg | ORAL_TABLET | Freq: Two times a day (BID) | ORAL | Status: DC
  Administered 2020-10-17 – 2020-10-19 (×4): 12.5 mg via ORAL
  Filled 2020-10-15 (×4): qty 1

## 2020-10-15 MED ORDER — MIDAZOLAM HCL 2 MG/2ML IJ SOLN
2.0000 mg | INTRAMUSCULAR | Status: DC | PRN
  Administered 2020-10-15 – 2020-10-16 (×9): 2 mg via INTRAVENOUS
  Filled 2020-10-15 (×9): qty 2

## 2020-10-15 MED ORDER — FAMOTIDINE IN NACL 20-0.9 MG/50ML-% IV SOLN
20.0000 mg | Freq: Two times a day (BID) | INTRAVENOUS | Status: AC
  Administered 2020-10-15 – 2020-10-16 (×2): 20 mg via INTRAVENOUS
  Filled 2020-10-15 (×2): qty 50

## 2020-10-15 MED ORDER — ORAL CARE MOUTH RINSE
15.0000 mL | OROMUCOSAL | Status: DC
  Administered 2020-10-15 – 2020-10-17 (×18): 15 mL via OROMUCOSAL

## 2020-10-15 MED ORDER — SODIUM BICARBONATE 8.4 % IV SOLN
100.0000 meq | Freq: Once | INTRAVENOUS | Status: AC
  Administered 2020-10-15: 100 meq via INTRAVENOUS

## 2020-10-15 MED ORDER — ALBUTEROL SULFATE HFA 108 (90 BASE) MCG/ACT IN AERS
INHALATION_SPRAY | RESPIRATORY_TRACT | Status: DC | PRN
  Administered 2020-10-15: 2 via RESPIRATORY_TRACT

## 2020-10-15 MED ORDER — LIDOCAINE 2% (20 MG/ML) 5 ML SYRINGE: INTRAMUSCULAR | Status: DC | PRN

## 2020-10-15 MED ORDER — PLASMA-LYTE 148 IV SOLN
INTRAVENOUS | Status: DC | PRN
  Administered 2020-10-15: 500 mL via INTRAVASCULAR

## 2020-10-15 MED ORDER — DEXTROSE 50 % IV SOLN: 0.0000 mL | INTRAVENOUS | Status: DC | PRN

## 2020-10-15 MED ORDER — SODIUM CHLORIDE 0.9% FLUSH: 10.0000 mL | INTRAVENOUS | Status: DC | PRN

## 2020-10-15 MED ORDER — BUPIVACAINE HCL (PF) 0.5 % IJ SOLN
INTRAMUSCULAR | Status: DC | PRN
  Administered 2020-10-15: 30 mL

## 2020-10-15 MED ORDER — SODIUM CHLORIDE 0.9 % IV SOLN
1.5000 g | Freq: Two times a day (BID) | INTRAVENOUS | Status: AC
  Administered 2020-10-15 – 2020-10-17 (×4): 1.5 g via INTRAVENOUS
  Filled 2020-10-15 (×4): qty 1.5

## 2020-10-15 MED ORDER — PROTAMINE SULFATE 10 MG/ML IV SOLN
INTRAVENOUS | Status: DC | PRN
  Administered 2020-10-15: 10 mg via INTRAVENOUS
  Administered 2020-10-15: 50 mg via INTRAVENOUS
  Administered 2020-10-15: 75 mg via INTRAVENOUS
  Administered 2020-10-15: 15 mg via INTRAVENOUS
  Administered 2020-10-15 (×2): 75 mg via INTRAVENOUS

## 2020-10-15 MED ORDER — FENTANYL CITRATE (PF) 250 MCG/5ML IJ SOLN
INTRAMUSCULAR | Status: AC
  Filled 2020-10-15: qty 20

## 2020-10-15 MED ORDER — DEXMEDETOMIDINE HCL IN NACL 400 MCG/100ML IV SOLN
0.4000 ug/kg/h | INTRAVENOUS | Status: DC
  Administered 2020-10-15: 0.7 ug/kg/h via INTRAVENOUS
  Administered 2020-10-16: 0.5 ug/kg/h via INTRAVENOUS
  Administered 2020-10-16: 0.4 ug/kg/h via INTRAVENOUS
  Administered 2020-10-17: 1.2 ug/kg/h via INTRAVENOUS
  Administered 2020-10-17: 0.5 ug/kg/h via INTRAVENOUS
  Filled 2020-10-15 (×6): qty 100
  Filled 2020-10-15: qty 200
  Filled 2020-10-15: qty 100

## 2020-10-15 MED ORDER — MIDAZOLAM HCL (PF) 5 MG/ML IJ SOLN: INTRAMUSCULAR | Status: DC | PRN

## 2020-10-15 MED ORDER — LEVALBUTEROL TARTRATE 45 MCG/ACT IN AERO: 2.0000 | INHALATION_SPRAY | Freq: Four times a day (QID) | RESPIRATORY_TRACT | Status: DC

## 2020-10-15 MED ORDER — KETAMINE HCL 50 MG/5ML IJ SOSY
PREFILLED_SYRINGE | INTRAMUSCULAR | Status: AC
  Filled 2020-10-15: qty 5

## 2020-10-15 MED ORDER — MIDAZOLAM HCL (PF) 5 MG/ML IJ SOLN
INTRAMUSCULAR | Status: DC | PRN
  Administered 2020-10-15: 2 mg via INTRAVENOUS
  Administered 2020-10-15: 5 mg via INTRAVENOUS
  Administered 2020-10-15: 3 mg via INTRAVENOUS

## 2020-10-15 MED ORDER — LACTATED RINGERS IV SOLN: INTRAVENOUS | Status: DC

## 2020-10-15 MED ORDER — PLATELET POOR PLASMA OPTIME
Status: DC | PRN
  Administered 2020-10-15: 10 mL

## 2020-10-15 MED ORDER — BISACODYL 10 MG RE SUPP
10.0000 mg | Freq: Every day | RECTAL | Status: DC
  Administered 2020-10-17 – 2020-10-22 (×2): 10 mg via RECTAL
  Filled 2020-10-15 (×2): qty 1

## 2020-10-15 MED ORDER — PANTOPRAZOLE SODIUM 40 MG PO TBEC: 40.0000 mg | DELAYED_RELEASE_TABLET | Freq: Every day | ORAL | Status: DC

## 2020-10-15 MED ORDER — CHLORHEXIDINE GLUCONATE 0.12% ORAL RINSE (MEDLINE KIT)
15.0000 mL | Freq: Two times a day (BID) | OROMUCOSAL | Status: DC
  Administered 2020-10-15 – 2020-10-17 (×4): 15 mL via OROMUCOSAL

## 2020-10-15 MED ORDER — INSULIN REGULAR(HUMAN) IN NACL 100-0.9 UT/100ML-% IV SOLN: INTRAVENOUS | Status: DC

## 2020-10-15 MED ORDER — BUPIVACAINE LIPOSOME 1.3 % IJ SUSP
20.0000 mL | Freq: Once | INTRAMUSCULAR | Status: AC
  Administered 2020-10-15: 20 mL
  Filled 2020-10-15: qty 20

## 2020-10-15 MED ORDER — ONDANSETRON HCL 4 MG/2ML IJ SOLN
4.0000 mg | Freq: Four times a day (QID) | INTRAMUSCULAR | Status: DC | PRN
  Administered 2020-10-18 – 2020-10-19 (×2): 4 mg via INTRAVENOUS
  Filled 2020-10-15 (×2): qty 2

## 2020-10-15 MED ORDER — ROCURONIUM BROMIDE 10 MG/ML (PF) SYRINGE
PREFILLED_SYRINGE | INTRAVENOUS | Status: AC
  Filled 2020-10-15: qty 20

## 2020-10-15 MED ORDER — SODIUM CHLORIDE 0.9 % IV SOLN: INTRAVENOUS | Status: DC

## 2020-10-15 MED ORDER — EPHEDRINE SULFATE 50 MG/ML IJ SOLN
INTRAMUSCULAR | Status: DC | PRN
  Administered 2020-10-15 (×2): 10 mg via INTRAVENOUS
  Administered 2020-10-15: 15 mg via INTRAVENOUS
  Administered 2020-10-15: 10 mg via INTRAVENOUS
  Administered 2020-10-15: 5 mg via INTRAVENOUS

## 2020-10-15 MED ORDER — LACTATED RINGERS IV SOLN: INTRAVENOUS | Status: DC | PRN

## 2020-10-15 MED ORDER — ACETAMINOPHEN 160 MG/5ML PO SOLN
1000.0000 mg | Freq: Four times a day (QID) | ORAL | Status: DC
  Administered 2020-10-16 – 2020-10-17 (×6): 1000 mg
  Filled 2020-10-15 (×7): qty 40.6

## 2020-10-15 MED ORDER — STERILE WATER FOR INJECTION IJ SOLN
INTRAMUSCULAR | Status: AC
  Filled 2020-10-15: qty 10

## 2020-10-15 MED ORDER — MOMETASONE FURO-FORMOTEROL FUM 200-5 MCG/ACT IN AERO: 2.0000 | INHALATION_SPRAY | Freq: Two times a day (BID) | RESPIRATORY_TRACT | Status: DC

## 2020-10-15 MED ORDER — PLATELET RICH PLASMA OPTIME
Status: DC | PRN
  Administered 2020-10-15: 10 mL

## 2020-10-15 MED ORDER — CHLORHEXIDINE GLUCONATE CLOTH 2 % EX PADS: 6.0000 | MEDICATED_PAD | Freq: Every day | CUTANEOUS | Status: DC

## 2020-10-15 MED ORDER — MIDAZOLAM HCL (PF) 10 MG/2ML IJ SOLN
INTRAMUSCULAR | Status: AC
  Filled 2020-10-15: qty 2

## 2020-10-15 MED ORDER — TRAMADOL HCL 50 MG PO TABS
50.0000 mg | ORAL_TABLET | ORAL | Status: DC | PRN
  Administered 2020-10-16: 100 mg

## 2020-10-15 MED ORDER — HEPARIN SODIUM (PORCINE) 1000 UNIT/ML IJ SOLN
INTRAMUSCULAR | Status: DC | PRN
  Administered 2020-10-15: 30000 [IU] via INTRAVENOUS

## 2020-10-15 MED ORDER — ALBUMIN HUMAN 5 % IV SOLN
250.0000 mL | INTRAVENOUS | Status: AC | PRN
  Administered 2020-10-15: 12.5 g via INTRAVENOUS
  Filled 2020-10-15: qty 250

## 2020-10-15 MED ORDER — TRAMADOL HCL 50 MG PO TABS
50.0000 mg | ORAL_TABLET | ORAL | Status: DC | PRN
  Filled 2020-10-15: qty 2

## 2020-10-15 MED ORDER — BISACODYL 5 MG PO TBEC
10.0000 mg | DELAYED_RELEASE_TABLET | Freq: Every day | ORAL | Status: DC
  Administered 2020-10-16 – 2020-10-24 (×7): 10 mg via ORAL
  Filled 2020-10-15 (×7): qty 2

## 2020-10-15 MED ORDER — SODIUM CHLORIDE (PF) 0.9 % IJ SOLN
INTRAMUSCULAR | Status: AC
  Filled 2020-10-15: qty 10

## 2020-10-15 MED ORDER — METOPROLOL TARTRATE 5 MG/5ML IV SOLN: 2.5000 mg | INTRAVENOUS | Status: DC | PRN

## 2020-10-15 MED ORDER — FENTANYL CITRATE (PF) 250 MCG/5ML IJ SOLN
INTRAMUSCULAR | Status: DC | PRN
  Administered 2020-10-15: 100 ug via INTRAVENOUS
  Administered 2020-10-15: 150 ug via INTRAVENOUS
  Administered 2020-10-15: 100 ug via INTRAVENOUS
  Administered 2020-10-15: 250 ug via INTRAVENOUS
  Administered 2020-10-15 (×2): 50 ug via INTRAVENOUS
  Administered 2020-10-15 (×2): 100 ug via INTRAVENOUS
  Administered 2020-10-15: 150 ug via INTRAVENOUS
  Administered 2020-10-15: 50 ug via INTRAVENOUS

## 2020-10-15 MED ORDER — PROPOFOL 10 MG/ML IV BOLUS
INTRAVENOUS | Status: AC
  Filled 2020-10-15: qty 20

## 2020-10-15 MED ORDER — ASPIRIN 81 MG PO CHEW
324.0000 mg | CHEWABLE_TABLET | Freq: Every day | ORAL | Status: DC
  Administered 2020-10-16 – 2020-10-17 (×2): 324 mg
  Filled 2020-10-15 (×3): qty 4

## 2020-10-15 MED ORDER — KETAMINE HCL 10 MG/ML IJ SOLN
INTRAMUSCULAR | Status: DC | PRN
  Administered 2020-10-15: 30 mg via INTRAVENOUS
  Administered 2020-10-15: 20 mg via INTRAVENOUS

## 2020-10-15 MED ORDER — LEVALBUTEROL HCL 0.63 MG/3ML IN NEBU
0.6300 mg | INHALATION_SOLUTION | Freq: Four times a day (QID) | RESPIRATORY_TRACT | Status: AC
  Administered 2020-10-15 – 2020-10-17 (×8): 0.63 mg via RESPIRATORY_TRACT
  Filled 2020-10-15 (×8): qty 3

## 2020-10-15 MED ORDER — SODIUM CHLORIDE 0.9% FLUSH
10.0000 mL | Freq: Two times a day (BID) | INTRAVENOUS | Status: DC
  Administered 2020-10-15 – 2020-10-17 (×5): 10 mL
  Administered 2020-10-18: 20 mL
  Administered 2020-10-19 – 2020-10-24 (×12): 10 mL
  Administered 2020-10-25: 20 mL

## 2020-10-15 MED ORDER — ASPIRIN EC 325 MG PO TBEC
325.0000 mg | DELAYED_RELEASE_TABLET | Freq: Every day | ORAL | Status: DC
  Administered 2020-10-18 – 2020-10-19 (×2): 325 mg via ORAL
  Filled 2020-10-15 (×2): qty 1

## 2020-10-15 MED ORDER — BUPIVACAINE HCL (PF) 0.5 % IJ SOLN
INTRAMUSCULAR | Status: AC
  Filled 2020-10-15: qty 30

## 2020-10-15 MED ORDER — CHLORHEXIDINE GLUCONATE 0.12 % MT SOLN
15.0000 mL | OROMUCOSAL | Status: AC
  Administered 2020-10-15: 15 mL via OROMUCOSAL

## 2020-10-15 MED ORDER — NITROGLYCERIN IN D5W 200-5 MCG/ML-% IV SOLN: 0.0000 ug/min | INTRAVENOUS | Status: DC

## 2020-10-15 MED ORDER — PANTOPRAZOLE SODIUM 40 MG PO PACK
40.0000 mg | PACK | Freq: Every day | ORAL | Status: DC
  Administered 2020-10-16 – 2020-10-18 (×3): 40 mg
  Filled 2020-10-15 (×3): qty 20

## 2020-10-15 MED ORDER — LACTATED RINGERS IV SOLN: 500.0000 mL | Freq: Once | INTRAVENOUS | Status: DC | PRN

## 2020-10-15 MED ORDER — OXYCODONE HCL 5 MG PO TABS
5.0000 mg | ORAL_TABLET | ORAL | Status: DC | PRN
  Administered 2020-10-16 – 2020-10-18 (×5): 10 mg
  Administered 2020-10-18: 5 mg
  Filled 2020-10-15: qty 1
  Filled 2020-10-15 (×4): qty 2

## 2020-10-15 MED ORDER — VANCOMYCIN HCL IN DEXTROSE 1-5 GM/200ML-% IV SOLN
1000.0000 mg | Freq: Once | INTRAVENOUS | Status: AC
  Administered 2020-10-15: 1000 mg via INTRAVENOUS
  Filled 2020-10-15: qty 200

## 2020-10-15 MED ORDER — OXYCODONE HCL 5 MG PO TABS
5.0000 mg | ORAL_TABLET | ORAL | Status: DC | PRN
  Filled 2020-10-15: qty 2

## 2020-10-15 MED ORDER — CHLORHEXIDINE GLUCONATE CLOTH 2 % EX PADS
6.0000 | MEDICATED_PAD | Freq: Every day | CUTANEOUS | Status: DC
  Administered 2020-10-15 – 2020-10-25 (×10): 6 via TOPICAL

## 2020-10-15 MED ORDER — ROCURONIUM BROMIDE 10 MG/ML (PF) SYRINGE
PREFILLED_SYRINGE | INTRAVENOUS | Status: DC | PRN
  Administered 2020-10-15: 100 mg via INTRAVENOUS
  Administered 2020-10-15: 50 mg via INTRAVENOUS
  Administered 2020-10-15: 20 mg via INTRAVENOUS
  Administered 2020-10-15: 30 mg via INTRAVENOUS

## 2020-10-15 MED ORDER — SODIUM CHLORIDE 0.9% FLUSH
3.0000 mL | Freq: Two times a day (BID) | INTRAVENOUS | Status: DC
  Administered 2020-10-16 – 2020-10-24 (×14): 3 mL via INTRAVENOUS

## 2020-10-15 MED ORDER — INSULIN ASPART 100 UNIT/ML ~~LOC~~ SOLN
0.0000 [IU] | SUBCUTANEOUS | Status: DC
  Administered 2020-10-15 – 2020-10-16 (×3): 2 [IU] via SUBCUTANEOUS
  Administered 2020-10-16: 4 [IU] via SUBCUTANEOUS
  Administered 2020-10-16 – 2020-10-21 (×6): 2 [IU] via SUBCUTANEOUS

## 2020-10-15 MED ORDER — METOPROLOL TARTRATE 25 MG/10 ML ORAL SUSPENSION
12.5000 mg | Freq: Two times a day (BID) | ORAL | Status: DC
  Administered 2020-10-16: 12.5 mg
  Filled 2020-10-15 (×3): qty 5

## 2020-10-15 SURGICAL SUPPLY — 125 items
ADAPTER CARDIO PERF ANTE/RETRO (ADAPTER) ×5 IMPLANT
ADH SKN CLS APL DERMABOND .7 (GAUZE/BANDAGES/DRESSINGS) ×4
ADPR PRFSN 84XANTGRD RTRGD (ADAPTER) ×4
APL SRG 7X2 LUM MLBL SLNT (VASCULAR PRODUCTS) ×4
APPLICATOR TIP COSEAL (VASCULAR PRODUCTS) ×5 IMPLANT
APPLIER CLIP 9.375 SM OPEN (CLIP) ×5
APR CLP SM 9.3 20 MLT OPN (CLIP) ×4
BAG DECANTER FOR FLEXI CONT (MISCELLANEOUS) ×5 IMPLANT
BALLN IABP SENSA PLUS 8F 50CC (BALLOONS) ×5
BALLOON IABP SENS PLUS 8F 50CC (BALLOONS) ×4 IMPLANT
BATTERY MAXDRIVER (MISCELLANEOUS) ×5 IMPLANT
BLADE CLIPPER SURG (BLADE) ×5 IMPLANT
BLADE STERNUM SYSTEM 6 (BLADE) ×5 IMPLANT
BLADE SURG 11 STRL SS (BLADE) ×5 IMPLANT
BLADE SURG 15 STRL LF DISP TIS (BLADE) ×4 IMPLANT
BLADE SURG 15 STRL SS (BLADE) ×5
BNDG ELASTIC 4X5.8 VLCR STR LF (GAUZE/BANDAGES/DRESSINGS) ×5 IMPLANT
BNDG ELASTIC 6X5.8 VLCR STR LF (GAUZE/BANDAGES/DRESSINGS) ×5 IMPLANT
BNDG GAUZE ELAST 4 BULKY (GAUZE/BANDAGES/DRESSINGS) ×5 IMPLANT
CANISTER SUCT 3000ML PPV (MISCELLANEOUS) ×5 IMPLANT
CANISTER WOUNDNEG PRESSURE 500 (CANNISTER) ×5 IMPLANT
CANNULA NON VENT 22FR 12 (CANNULA) ×5 IMPLANT
CATH CPB KIT HENDRICKSON (MISCELLANEOUS) ×5 IMPLANT
CATH ROBINSON RED A/P 18FR (CATHETERS) ×10 IMPLANT
CLIP APPLIE 9.375 SM OPEN (CLIP) ×4 IMPLANT
CLIP RETRACTION 3.0MM CORONARY (MISCELLANEOUS) ×5 IMPLANT
CLIP VESOCCLUDE MED 24/CT (CLIP) ×5 IMPLANT
CLIP VESOCCLUDE SM WIDE 24/CT (CLIP) ×15 IMPLANT
CONN ST 1/4X3/8  BEN (MISCELLANEOUS) ×1
CONN ST 1/4X3/8 BEN (MISCELLANEOUS) ×4 IMPLANT
COVER MAYO STAND STRL (DRAPES) ×5 IMPLANT
CUFF TOURN SGL QUICK 18X4 (TOURNIQUET CUFF) IMPLANT
CUFF TOURN SGL QUICK 24 (TOURNIQUET CUFF)
CUFF TRNQT CYL 24X4X16.5-23 (TOURNIQUET CUFF) IMPLANT
DEFOGGER ANTIFOG KIT (MISCELLANEOUS) ×5 IMPLANT
DERMABOND ADVANCED (GAUZE/BANDAGES/DRESSINGS) ×1
DERMABOND ADVANCED .7 DNX12 (GAUZE/BANDAGES/DRESSINGS) ×4 IMPLANT
DRAIN CHANNEL 28F RND 3/8 FF (WOUND CARE) ×15 IMPLANT
DRAPE CARDIOVASCULAR INCISE (DRAPES) ×5
DRAPE EXTREMITY T 121X128X90 (DISPOSABLE) IMPLANT
DRAPE HALF SHEET 40X57 (DRAPES) IMPLANT
DRAPE SLUSH/WARMER DISC (DRAPES) ×5 IMPLANT
DRAPE SRG 135X102X78XABS (DRAPES) ×4 IMPLANT
DRESSING PEEL AND PLAC PRVNA20 (GAUZE/BANDAGES/DRESSINGS) ×4 IMPLANT
DRSG AQUACEL AG ADV 3.5X14 (GAUZE/BANDAGES/DRESSINGS) IMPLANT
DRSG PEEL AND PLACE PREVENA 20 (GAUZE/BANDAGES/DRESSINGS) ×5
ELECT CAUTERY BLADE 6.4 (BLADE) ×5 IMPLANT
ELECT REM PT RETURN 9FT ADLT (ELECTROSURGICAL) ×10
ELECTRODE REM PT RTRN 9FT ADLT (ELECTROSURGICAL) ×8 IMPLANT
FELT TEFLON 1X6 (MISCELLANEOUS) ×5 IMPLANT
GAUZE SPONGE 4X4 12PLY STRL (GAUZE/BANDAGES/DRESSINGS) ×10 IMPLANT
GEL ULTRASOUND 20GR AQUASONIC (MISCELLANEOUS) ×5 IMPLANT
GLOVE NEODERM STRL 7.5 LF PF (GLOVE) ×12 IMPLANT
GLOVE SURG NEODERM 7.5  LF PF (GLOVE) ×3
GOWN STRL REUS W/ TWL LRG LVL3 (GOWN DISPOSABLE) ×24 IMPLANT
GOWN STRL REUS W/TWL LRG LVL3 (GOWN DISPOSABLE) ×30
HEMOSTAT POWDER SURGIFOAM 1G (HEMOSTASIS) ×10 IMPLANT
INSERT FOGARTY XLG (MISCELLANEOUS) ×5 IMPLANT
INSERT SUTURE HOLDER (MISCELLANEOUS) ×5 IMPLANT
KIT APPLICATOR RATIO 11:1 (KITS) ×5 IMPLANT
KIT BASIN OR (CUSTOM PROCEDURE TRAY) ×5 IMPLANT
KIT SUCTION CATH 14FR (SUCTIONS) ×5 IMPLANT
KIT TURNOVER KIT B (KITS) ×5 IMPLANT
KIT VASOVIEW HEMOPRO 2 VH 4000 (KITS) ×5 IMPLANT
LINE EXTENSION DELIVERY (MISCELLANEOUS) ×5 IMPLANT
MARKER GRAFT CORONARY BYPASS (MISCELLANEOUS) IMPLANT
NEEDLE 18GX1X1/2 (RX/OR ONLY) (NEEDLE) ×5 IMPLANT
NS IRRIG 1000ML POUR BTL (IV SOLUTION) ×25 IMPLANT
PACK E OPEN HEART (SUTURE) ×5 IMPLANT
PACK OPEN HEART (CUSTOM PROCEDURE TRAY) ×5 IMPLANT
PACK PLATELET PROCEDURE 60 (MISCELLANEOUS) ×5 IMPLANT
PACK SPY-PHI (KITS) ×5 IMPLANT
PAD ARMBOARD 7.5X6 YLW CONV (MISCELLANEOUS) ×10 IMPLANT
PAD ELECT DEFIB RADIOL ZOLL (MISCELLANEOUS) ×5 IMPLANT
PENCIL BUTTON HOLSTER BLD 10FT (ELECTRODE) ×5 IMPLANT
PLATE STERNAL 2.3X208 14H 2-PK (Plate) ×5 IMPLANT
POSITIONER HEAD DONUT 9IN (MISCELLANEOUS) ×5 IMPLANT
POWDER SURGICEL 3.0 GRAM (HEMOSTASIS) ×5 IMPLANT
PUNCH AORTIC ROTATE 4.5MM 8IN (MISCELLANEOUS) ×5 IMPLANT
SCREW STERNAL 2.3X17MM (Screw) ×25 IMPLANT
SCREW STERNAL LOCK 2.3MM (Screw) ×35 IMPLANT
SEALANT SURG COSEAL 4ML (VASCULAR PRODUCTS) IMPLANT
SEALANT SURG COSEAL 8ML (VASCULAR PRODUCTS) ×5 IMPLANT
SET CARDIOPLEGIA MPS 5001102 (MISCELLANEOUS) ×5 IMPLANT
SHEARS HARMONIC 9CM CVD (BLADE) IMPLANT
SUT BONE WAX W31G (SUTURE) ×5 IMPLANT
SUT MNCRL AB 3-0 PS2 18 (SUTURE) ×10 IMPLANT
SUT MNCRL AB 4-0 PS2 18 (SUTURE) ×5 IMPLANT
SUT PDS AB 1 CTX 36 (SUTURE) ×10 IMPLANT
SUT PROLENE 3 0 SH DA (SUTURE) ×10 IMPLANT
SUT PROLENE 4 0 SH DA (SUTURE) ×5 IMPLANT
SUT PROLENE 5 0 C 1 36 (SUTURE) IMPLANT
SUT PROLENE 6 0 C 1 30 (SUTURE) ×20 IMPLANT
SUT PROLENE 7 0 BV 1 (SUTURE) ×10 IMPLANT
SUT PROLENE 8 0 BV175 6 (SUTURE) ×5 IMPLANT
SUT PROLENE BLUE 7 0 (SUTURE) ×5 IMPLANT
SUT PROLENE POLY MONO (SUTURE) ×5 IMPLANT
SUT SILK  1 MH (SUTURE) ×1
SUT SILK 1 MH (SUTURE) ×4 IMPLANT
SUT SILK 2 0 SH CR/8 (SUTURE) IMPLANT
SUT SILK 3 0 SH CR/8 (SUTURE) IMPLANT
SUT STEEL 6MS V (SUTURE) ×5 IMPLANT
SUT STEEL SZ 6 DBL 3X14 BALL (SUTURE) ×5 IMPLANT
SUT VIC AB 2-0 CT1 27 (SUTURE) ×5
SUT VIC AB 2-0 CT1 TAPERPNT 27 (SUTURE) ×4 IMPLANT
SUT VIC AB 2-0 CTX 27 (SUTURE) IMPLANT
SUT VIC AB 3-0 SH 27 (SUTURE)
SUT VIC AB 3-0 SH 27X BRD (SUTURE) IMPLANT
SUT VIC AB 3-0 X1 27 (SUTURE) IMPLANT
SYR 10ML LL (SYRINGE) IMPLANT
SYR 30ML LL (SYRINGE) ×5 IMPLANT
SYR 3ML LL SCALE MARK (SYRINGE) ×5 IMPLANT
SYR 50ML SLIP (SYRINGE) IMPLANT
SYSTEM SAHARA CHEST DRAIN ATS (WOUND CARE) ×5 IMPLANT
TABLE PACK (MISCELLANEOUS) ×5 IMPLANT
TAPE CLOTH SURG 4X10 WHT LF (GAUZE/BANDAGES/DRESSINGS) ×10 IMPLANT
TAPE PAPER 2X10 WHT MICROPORE (GAUZE/BANDAGES/DRESSINGS) ×5 IMPLANT
TIP DUAL SPRAY TOPICAL (TIP) ×10 IMPLANT
TOWEL GREEN STERILE (TOWEL DISPOSABLE) ×5 IMPLANT
TOWEL GREEN STERILE FF (TOWEL DISPOSABLE) ×5 IMPLANT
TRAY FOLEY SLVR 16FR TEMP STAT (SET/KITS/TRAYS/PACK) ×5 IMPLANT
TUBING LAP HI FLOW INSUFFLATIO (TUBING) ×5 IMPLANT
UNDERPAD 30X36 HEAVY ABSORB (UNDERPADS AND DIAPERS) ×5 IMPLANT
WATER STERILE IRR 1000ML POUR (IV SOLUTION) ×10 IMPLANT
WATER STERILE IRR 1000ML UROMA (IV SOLUTION) IMPLANT

## 2020-10-15 NOTE — Transfer of Care (Signed)
Immediate Anesthesia Transfer of Care Note  Patient: Jonathan [name]  Procedure(s) Performed: CORONARY ARTERY BYPASS GRAFTING (CABG) TIMES FOUR USING BILATERAL INTERNAL MAMMARY ARTERIES AND ENDOSCOPICALLY HARVESTED GREATER SAPHENOUS VEIN AND INSERTION OF 50CC INTRAAORTIC BALLOON PUMP (N/A Chest) TRANSESOPHAGEAL ECHOCARDIOGRAM (TEE) (N/A ) INDOCYANINE GREEN FLUORESCENCE IMAGING (ICG) (N/A ) ENDOVEIN HARVEST OF GREATER SAPHENOUS VEIN (Right )  Patient Location: ICU  Anesthesia Type:General  Level of Consciousness: sedated and unresponsive  Airway & Oxygen Therapy: Patient remains intubated per anesthesia plan and Patient placed on Ventilator (see vital sign flow sheet for setting)  Post-op Assessment: Report given to RN and Post -op Vital signs reviewed and stable  Post vital signs: Reviewed and stable  Last Vitals:  Vitals Value Taken Time  BP    Temp    Pulse 80 10/15/20 1320  Resp 12 10/15/20 1320  SpO2 100 % 10/15/20 1320  Vitals shown include unvalidated device data.  Last Pain:  Vitals:   10/15/20 0455  TempSrc: Oral  PainSc:       Patients Stated Pain Goal: 0 (68/12/75 1700)  Complications: No complications documented.

## 2020-10-15 NOTE — Anesthesia Procedure Notes (Signed)
Arterial Line Insertion Start/End2/11/2020 6:55 AM, 10/15/2020 7:05 AM Performed by: CRNA  Patient location: Pre-op. Lidocaine 1% used for infiltration Right, radial was placed Catheter size: 20 G Hand hygiene performed  and maximum sterile barriers used   Attempts: 2 Procedure performed without using ultrasound guided technique. Following insertion, dressing applied and Biopatch. Post procedure assessment: normal  Patient tolerated the procedure well with no immediate complications.

## 2020-10-15 NOTE — Op Note (Signed)
CARDIOTHORACIC SURGERY OPERATIVE NOTE  Date of Procedure: 10/15/2020  Preoperative Diagnosis: Severe 3-vessel Coronary Artery Disease with progressive angina  Postoperative Diagnosis: Same  Procedure:    Coronary Artery Bypass Grafting x 4  Left Internal Mammary Artery to Distal Left Anterior Descending Coronary Artery; pedicled right internal mammary artery to right posterior lateral coronary Artery; Saphenous Vein Graft to first obtuse Marginal Branch of Left Circumflex Coronary Artery; Sapheonous Vein Graft to first diagonal Branch Coronary Artery; Endoscopic Vein Harvest from right thigh and Lower Leg Bilateral internal mammary artery harvesting Completion graft surveillance with indocyanine green fluorescence angiography Rigid sternal reconstruction with linear plating system Insertion of intra-aortic balloon pump under TEE guidance via the left common femoral artery  Surgeon: B.  Murvin Natal, MD  Assistant: T.  Harriet Pho, PA-C  Anesthesia: General  Operative Findings:  Preserved left ventricular systolic function  Good quality internal mammary artery conduits  Good quality saphenous vein conduit  Good quality target vessels for grafting    BRIEF CLINICAL NOTE AND INDICATIONS FOR SURGERY  70 year old gentleman with longstanding and known coronary artery disease represented with worsening symptoms.  He underwent left heart catheterization demonstrating severe multivessel coronary artery disease.  He is referred for CABG.  He has been thoroughly evaluated and is considered a good candidate for the procedure and is taken the operating room on the above listed date   DETAILS OF THE OPERATIVE PROCEDURE  Preparation:  The patient is brought to the operating room on the above mentioned date and central monitoring was established by the anesthesia team including placement of Swan-Ganz catheter and radial arterial line. The patient is placed in the supine position on the operating  table.  Intravenous antibiotics are administered. General endotracheal anesthesia is induced uneventfully. A Foley catheter is placed.  Baseline transesophageal echocardiogram was performed.  Findings were notable for preserved left ventricular function and no significant valvular disease The patient's chest, abdomen, both groins, and both lower extremities are prepared and draped in a sterile manner. A time out procedure is performed.   Surgical Approach and Conduit Harvest:  Prior to surgical incision a balloon pump was placed via the left common femoral artery due to the patient's poor and severe anatomy.  A median sternotomy incision was performed and the left internal mammary artery is dissected from the chest wall and prepared for bypass grafting. The left internal mammary artery is notably good quality conduit. Simultaneously, the greater saphenous vein is obtained from the patient's right thigh using endoscopic vein harvest technique. The saphenous vein is notably good quality conduit. After removal of the saphenous vein, the small surgical incisions in the lower extremity are closed with absorbable suture.  Attention is turned to the right hemithorax of the right internal mammary artery and its pedicle mobilized in standard fashion.  Prior to dividing this pedicle distally full dose heparin is given intravenously following systemic heparinization, the  internal mammary artery grafts were transected distally noted to have excellent flow.  They were both treated with a solution of papaverine.    Extracorporeal Cardiopulmonary Bypass and Myocardial Protection:  The pericardium is opened. The ascending aorta is nondiseased in appearance. The ascending aorta and the right atrium are cannulated for cardiopulmonary bypass.  Adequate heparinization is verified.     The entire pre-bypass portion of the operation was notable for stable hemodynamics.  Cardiopulmonary bypass was begun and the surface  of the heart is inspected. Distal target vessels are selected for coronary artery bypass grafting. A cardioplegia  cannula is placed in the ascending aorta.    The patient is allowed to cool passively to 34C systemic temperature.  The aortic cross clamp is applied and cold blood cardioplegia is delivered initially in an antegrade fashion through the aortic root. Iced saline slush is applied for topical hypothermia.  The initial cardioplegic arrest is rapid with early diastolic arrest.  Repeat doses of cardioplegia are administered intermittently throughout the entire cross clamp portion of the operation through the aortic root and through subsequently placed vein grafts in order to maintain completely flat electrocardiogram.   Coronary Artery Bypass Grafting:   The posterior lateral branch of the right coronary artery was grafted using the pedicled right internal mammary artery  graft in an end-to-side fashion.  At the site of distal anastomosis the target vessel was good quality and measured approximately 2 mm in diameter. Anastomotic patency and runoff was confirmed with indocyanine green fluorescence imaging (SPY).  The first obtuse marginal branch of the left circumflex coronary artery was grafted using a reversed saphenous vein graft in an end-to-side fashion.  At the site of distal anastomosis the target vessel was good quality and measured approximately 1.5 mm in diameter.  The first diagonal branch of the left anterior descending coronary artery was grafted using a reversed saphenous vein graft in an end-to-side fashion.  At the site of distal anastomosis the target vessel was good quality and measured approximately 1.5 mm in diameter.   The distal left anterior coronary artery was grafted with the left internal mammary artery in an end-to-side fashion.  At the site of distal anastomosis the target vessel was good quality and measured approximately 1.5 mm in diameter.Anastomotic patency and  runoff was confirmed with indocyanine green fluorescence imaging (SPY).  All proximal vein graft anastomoses were placed directly to the ascending aorta prior to removal of the aortic cross clamp.  A hotshot dose of cardioplegia was given down the aortic root and the aortic cross-clamp was removed after de-airing procedures.   Procedure Completion:  All proximal and distal coronary anastomoses were inspected for hemostasis and appropriate graft orientation. Epicardial pacing wires are fixed to the right ventricular outflow tract and to the right atrial appendage. The patient is rewarmed to 37C temperature. The patient is weaned and disconnected from cardiopulmonary bypass.  The patient's rhythm at separation from bypass was sinus bradycardia.  The patient was weaned from cardiopulmonary bypass with mild inotropic support.   Followup transesophageal echocardiogram performed after separation from bypass revealed no changes from the preoperative exam.  The aortic and venous cannula were removed uneventfully. Protamine was administered to reverse the anticoagulation. The mediastinum and pleural space were inspected for hemostasis and irrigated with saline solution. The mediastinum and both pleural spaces were drained using fluted chest tubes placed through separate stab incisions inferiorly.  The soft tissues anterior to the aorta were reapproximated loosely. The sternum is closed in a rigid sternal fashion with a linear plating system and  with double strength sternal wire. The soft tissues anterior to the sternum were closed in multiple layers and the skin is closed with a running subcuticular skin closure.  The post-bypass portion of the operation was notable for stable rhythm and hemodynamics.  No blood products were administered during the operation.   Disposition:  The patient tolerated the procedure well and is transported to the surgical intensive care in stable condition. There are no  intraoperative complications. All sponge instrument and needle counts are verified correct at completion of the  operation.    Jayme Cloud, MD 10/15/2020 1:50 PM

## 2020-10-15 NOTE — Hospital Course (Addendum)
   HPI:   70 yo man with known severe multivessel CAD who had previously declined CABG (11/20) after presenting with AMI represented now with increased exertional CP and SOB. He states he "can't do anything" without experiencing pain. He has also noted new-onset bilateral pedal edema. He underwent repeat LHC 10/14/20 showing severe CAD including in-stent restenosis of RCA territory. Consult received for CABG. He stopped Plavix 10/10/20; his PMHx is remarkable for tobacco abuse, hypercholesterolemia, and HTN. He is intolerant of lipid lowering therapy.  Hospital Course:   [name] is a 70 year old male patient who underwent a coronary artery bypass grafting x4 on 10/15/2020 with Dr. Orvan Seen.  He tolerated the procedure well and was transferred to the surgical ICU for continued care.  During his stay in the SICU the patient's IABP was removed on POD #1.  He was weaned and extubated on 10/17/2020.  The patient was weaned off drips as hemodynamics allowed.  His chest tube and arterial lines were removed without difficulty.  His chest tubes and arterial lines were removed without difficulty.  The patient developed difficulty swallowing and coughing with medications.  CT scan of the head was obtained and showed evidence of a small infarct in the left posterior cerebellar hemisphere.  The age was indeterminate but was felt possible to be acute vs subacute.  He was made NPO and underwent SLP evaluation which showed evidence of moderate risk of aspiration.  He had an NG tube placed and was started on supplemental feedings.  Neurology consult was obtained and they recommended MRA of brain for further workup.  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  The patient repeatedly stated he wants to be a DNR.  Palliative care consult was requested to assist with ensuring patient's goals are met.   The patient wished to be made comfort care.  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  The patient was made DNR and will continue to recover as able.  He underwent a Modified Barium Swallow on 2/9.  He was felt to be able to tolerate Dysphagia 2 diet.  Per neurology recommendations the patient was started on Plavix.  The patient continued to make progress.  He was evaluated by PT/OT who recommended CIR placement.  Consult was placed and insurance authorization was initiated.  The patient was agreeable to CIR placement as he wished to get home with his wife.

## 2020-10-15 NOTE — Anesthesia Postprocedure Evaluation (Signed)
Anesthesia Post Note  Patient: Jonathan [name]  Procedure(s) Performed: CORONARY ARTERY BYPASS GRAFTING (CABG) TIMES FOUR USING BILATERAL INTERNAL MAMMARY ARTERIES AND ENDOSCOPICALLY HARVESTED GREATER SAPHENOUS VEIN AND INSERTION OF 50CC INTRAAORTIC BALLOON PUMP (N/A Chest) TRANSESOPHAGEAL ECHOCARDIOGRAM (TEE) (N/A ) INDOCYANINE GREEN FLUORESCENCE IMAGING (ICG) (N/A ) ENDOVEIN HARVEST OF GREATER SAPHENOUS VEIN (Right )     Patient location during evaluation: ICU Anesthesia Type: General Level of consciousness: sedated and patient remains intubated per anesthesia plan Pain management: pain level controlled Vital Signs Assessment: post-procedure vital signs reviewed and stable Respiratory status: patient remains intubated per anesthesia plan Cardiovascular status: stable Postop Assessment: no apparent nausea or vomiting Anesthetic complications: no   No complications documented.  Last Vitals:  Vitals:   10/15/20 1318 10/15/20 1334  BP:    Pulse:    Resp:    Temp:    SpO2: 100% 100%    Last Pain:  Vitals:   10/15/20 0455  TempSrc: Oral  PainSc:                  Audry Pili

## 2020-10-15 NOTE — Brief Op Note (Addendum)
10/14/2020 - 10/15/2020  1:31 PM  PATIENT:  Jonathan [name]  70 y.o. male  PRE-OPERATIVE DIAGNOSIS:  Coronary Artery Disease  POST-OPERATIVE DIAGNOSIS:  Coronary Artery Disease  PROCEDURE:  Procedure(s) with comments: CORONARY ARTERY BYPASS GRAFTING (CABG) TIMES FOUR USING BILATERAL INTERNAL MAMMARY ARTERIES AND ENDOSCOPICALLY HARVESTED GREATER SAPHENOUS VEIN AND INSERTION OF 50CC INTRAAORTIC BALLOON PUMP (N/A) - possible BIMA TRANSESOPHAGEAL ECHOCARDIOGRAM (TEE) (N/A) INDOCYANINE GREEN FLUORESCENCE IMAGING (ICG) (N/A) ENDOVEIN HARVEST OF GREATER SAPHENOUS VEIN (Right)   Vein harvest time: 25 minutes, prep time 10 minutes  LIMA to LAD RIMA to PL SVG to Diag 1 SVG to OM1  SURGEON:  Surgeon(s) and Role:    * [name], MD - Primary  PHYSICIAN ASSISTANT:  Nicholes Rough, PA-C   ANESTHESIA:   general  EBL:  800 mL   BLOOD ADMINISTERED:none  DRAINS:  routine    LOCAL MEDICATIONS USED:  BUPIVICAINE   SPECIMEN:  No Specimen  DISPOSITION OF SPECIMEN:  PATHOLOGY  COUNTS:  YES  DICTATION: .Dragon Dictation  PLAN OF CARE: Admit to inpatient   PATIENT DISPOSITION:  ICU - intubated and hemodynamically stable.   Delay start of Pharmacological VTE agent (>24hrs) due to surgical blood loss or risk of bleeding: yes   Agree w/documentation. Ravynn Hogate Z. Orvan Seen, [place]

## 2020-10-15 NOTE — Progress Notes (Signed)
      [address]       ,[place] [address]             [phone]      S/p CABG X 4, IABP  Intubated and sedated  BP (!) 102/59   Pulse 92   Temp (!) 97.16 F (36.2 C)   Resp 16   Ht 5\' 8"  (1.727 m)   Wt 99.8 kg   SpO2 97%   BMI 33.45 kg/m  36/23, CI= 1.9 IABP, Milrinone 0.25, norepi 6 IMV 16/50/8 PEEP   Hct= 36, PLT 145K   Intake/Output Summary (Last 24 hours) at 10/15/2020 1744 Last data filed at 10/15/2020 1700 Gross per 24 hour  Intake 5626.18 ml  Output 3020 ml  Net 2606.18 ml    Plan to remain intubated overnight and wean IABP in AM  Makahla Kiser C. Roxan Hockey, MD [facility] [phone]

## 2020-10-15 NOTE — Progress Notes (Signed)
  Echocardiogram Echocardiogram Transesophageal has been performed.  Park Beck L Tyus Kallam 10/15/2020, 1:17 PM 

## 2020-10-15 NOTE — Anesthesia Procedure Notes (Addendum)
Procedure Name: Intubation Date/Time: 10/15/2020 7:52 AM Performed by: Mariea Clonts, CRNA Pre-anesthesia Checklist: Patient identified, Emergency Drugs available, Suction available and Patient being monitored Patient Re-evaluated:Patient Re-evaluated prior to induction Oxygen Delivery Method: Circle System Utilized and Circle system utilized Preoxygenation: Pre-oxygenation with 100% oxygen Induction Type: IV induction Ventilation: Mask ventilation without difficulty and Oral airway inserted - appropriate to patient size Laryngoscope Size: Miller and 2 Grade View: Grade II Tube type: Oral Tube size: 7.5 mm Number of attempts: 1 Airway Equipment and Method: Stylet and Oral airway Placement Confirmation: ETT inserted through vocal cords under direct vision,  positive ETCO2 and breath sounds checked- equal and bilateral Tube secured with: Tape Dental Injury: Teeth and Oropharynx as per pre-operative assessment

## 2020-10-15 NOTE — Anesthesia Procedure Notes (Signed)
Central Venous Catheter Insertion Performed by: Audry Pili, MD, anesthesiologist Start/End2/11/2020 7:05 AM, 10/15/2020 7:07 AM Patient location: Pre-op. Preanesthetic checklist: patient identified, IV checked, risks and benefits discussed, surgical consent, monitors and equipment checked, pre-op evaluation, timeout performed and anesthesia consent Position: Trendelenburg Hand hygiene performed  and maximum sterile barriers used  Total catheter length 10. PA cath was placed.Swan type:thermodilution PA Cath depth:50 Procedure performed without using ultrasound guided technique. Attempts: 1 Patient tolerated the procedure well with no immediate complications.

## 2020-10-15 NOTE — Anesthesia Procedure Notes (Signed)
Central Venous Catheter Insertion Performed by: Audry Pili, MD, anesthesiologist Start/End2/11/2020 7:07 AM, 10/15/2020 6:56 AM Patient location: Pre-op. Preanesthetic checklist: patient identified, IV checked, risks and benefits discussed, surgical consent, monitors and equipment checked, pre-op evaluation, timeout performed and anesthesia consent Position: Trendelenburg Lidocaine 1% used for infiltration and patient sedated Hand hygiene performed , maximum sterile barriers used  and Seldinger technique used Catheter size: 8.5 Fr Central line was placed.MAC introducer Procedure performed using ultrasound guided technique. Ultrasound Notes:anatomy identified, needle tip was noted to be adjacent to the nerve/plexus identified, no ultrasound evidence of intravascular and/or intraneural injection and image(s) printed for medical record Attempts: 1 Following insertion, line sutured, dressing applied and Biopatch. Post procedure assessment: blood return through all ports, no air and free fluid flow  Patient tolerated the procedure well with no immediate complications.

## 2020-10-15 NOTE — Consult Note (Signed)
[address]       [place] [address]             [phone]        TEDD COTTRILL [facility] Medical Record #[id number] Date of Birth: 21-Aug-1951  Referring: No ref. provider found Primary Care: Kaleen Mask, MD Primary Cardiologist:Thomas Tresa Endo, MD  Chief Complaint:   No chief complaint on file.  Chest pain History of Present Illness:     70 yo man with known severe multivessel CAD who had previously declined CABG (11/20) after presenting with AMI represented now with increased exertional CP and SOB. He states he "can't do anything" without experiencing pain. He has also noted new-onset bilateral pedal edema. He underwent repeat LHC 10/14/20 showing severe CAD including in-stent restenosis of RCA territory. Consult received for CABG. He stopped Plavix 10/10/20; his PMHx is remarkable for tobacco abuse, hypercholesterolemia, and HTN. He is intolerant of lipid lowering therapy.    Current Activity/ Functional Status: Patient will be independent with mobility/ambulation, transfers, ADL's, IADL's.   Zubrod Score: At the time of surgery this patients most appropriate activity status/level should be described as: []     0    Normal activity, no symptoms [x]     1    Restricted in physical strenuous activity but ambulatory, able to do out light work []     2    Ambulatory and capable of self care, unable to do work activities, up and about                 more than 50%  Of the time                            []     3    Only limited self care, in bed greater than 50% of waking hours []     4    Completely disabled, no self care, confined to bed or chair []     5    Moribund  Past Medical History:  Diagnosis Date   Arthritis    hands    Chronic bronchitis (HCC)    Chronic sinusitis of both maxillary sinuses 04/16/2018   Sinus CT 04/25/2018 Mild mucoperiosteal thickening involving the frontoethmoidal sinuses. Otherwise largely clear sinuses with no evidence  for acute sinusitis.    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 (HCC)    DOE (dyspnea on exertion) 04/16/2018   Essential hypertension, benign 09/23/2012   Changed acei to arb 04/16/2018 due to pseudowheeze > improved 06/08/2018    Hernia, inguinal 09/23/2012   Hyperlipidemia    has not tolerated statins,  would not pursue PSK9 inhibitor as advised by Dr 06/10/2018   Hypertension    Myocardial infarction [facility])  NSTEMI (non-ST elevated myocardial infarction) (HCC) 09/23/2012   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   Tobacco abuse     Past Surgical History:  Procedure Laterality Date   CARDIAC CATHETERIZATION     CARDIOVASCULAR STRESS TEST  11/11/2011   CORONARY ANGIOPLASTY     stents- 01/2004    heart stents  01/2004   INGUINAL HERNIA REPAIR Right 12/06/2012   Procedure: HERNIA REPAIR INGUINAL ADULT;  Surgeon: Lodema Pilot, DO;  Location: [facility] ORS;  Service: General;  Laterality: Right;   INSERTION OF MESH Right 12/06/2012   Procedure: INSERTION OF MESH;  Surgeon: Lodema Pilot, DO;  Location: [facility] ORS;  Service: General;  Laterality: Right;   LEFT HEART CATH AND CORONARY ANGIOGRAPHY N/A 07/22/2019   Procedure: LEFT HEART CATH AND CORONARY ANGIOGRAPHY;  Surgeon: Lennette Bihari, MD;  Location: [facility] INVASIVE CV LAB;  Service: Cardiovascular;  Laterality: N/A;   LEFT HEART CATH AND CORONARY ANGIOGRAPHY N/A 10/14/2020   Procedure: LEFT HEART CATH AND CORONARY ANGIOGRAPHY;  Surgeon:  Lennette Bihari, MD;  Location: [facility] INVASIVE CV LAB;  Service: Cardiovascular;  Laterality: N/A;    Social History   Tobacco Use  Smoking Status Current Every Day Smoker   Packs/day: 1.00   Years: 53.00   Pack years: 53.00   Types: Cigarettes  Smokeless Tobacco Never Used    Social History   Substance and Sexual Activity  Alcohol Use Yes   Alcohol/week: 5.0 standard drinks   Types: 5 Standard drinks or equivalent per week   Comment: consumes 1 pint per day      Allergies  Allergen Reactions   Niaspan [Niacin Er] Itching and Other (See Comments)    insomnia   Statins     LFT elevavation, CK elevevation    Current Facility-Administered Medications  Medication Dose Route Frequency Provider Last Rate Last Admin   0.9 %  sodium chloride infusion   Intravenous Continuous Lennette Bihari, MD 125 mL/hr at 10/15/20 0317 Infusion Verify at 10/15/20 0317   [MAR Hold] 0.9 %  sodium chloride infusion  250 mL Intravenous PRN Lennette Bihari, MD       [MAR Hold] acetaminophen (TYLENOL) tablet 650 mg  650 mg Oral Q4H PRN Lennette Bihari, MD       [MAR Hold] amLODipine (NORVASC) tablet 5 mg  5 mg Oral Daily Lennette Bihari, MD       [MAR Hold] aspirin EC tablet 81 mg  81 mg Oral q morning - 10a Lennette Bihari, MD       cefUROXime (ZINACEF) 1.5 g in sodium chloride 0.9 % 100 mL IVPB  1.5 g Intravenous To OR Kaja Jackowski, Merri Brunette, MD       cefUROXime (ZINACEF) 750 mg in sodium chloride 0.9 % 100 mL IVPB  750 mg Intravenous To OR Argil Mahl, Merri Brunette, MD       dexmedetomidine (PRECEDEX) 400 MCG/100ML (4 mcg/mL) infusion  0.1-0.7 mcg/kg/hr Intravenous To OR Lalana Wachter, Merri Brunette, MD       EPINEPHrine (ADRENALIN) 4 mg in NS 250 mL (0.016 mg/mL) premix infusion  0-10 mcg/min Intravenous To OR Quincy Boy Z, MD       heparin 30,000 units/NS 1000 mL solution for CELLSAVER   Other To OR Kayce Chismar Z, MD       heparin ADULT infusion 100 units/mL (25000 units/247mL)  1,300 Units/hr  Intravenous Continuous Lennette Bihari, MD 13 mL/hr at 10/15/20 0308 1,300 Units/hr at 10/15/20 0308   heparin sodium (porcine)  2,500 Units, papaverine 30 mg in electrolyte-148 (PLASMALYTE-148) 500 mL irrigation   Irrigation To OR Mandie Crabbe, Merri Brunette, MD       insulin regular, human (MYXREDLIN) 100 units/ 100 mL infusion   Intravenous To OR Jaymie Misch, Merri Brunette, MD       [MAR Hold] isosorbide mononitrate (IMDUR) 24 hr tablet 120 mg  120 mg Oral Daily Lennette Bihari, MD       [MAR Hold] isosorbide mononitrate (IMDUR) 24 hr tablet 60 mg  60 mg Oral QHS Lennette Bihari, MD       magnesium sulfate (IV Push/IM) injection 40 mEq  40 mEq Other To OR Erroll Wilbourne, Merri Brunette, MD       metoprolol tartrate (LOPRESSOR) tablet 12.5 mg  12.5 mg Oral Once Tzivia Oneil, Merri Brunette, MD       milrinone (PRIMACOR) 20 MG/100 ML (0.2 mg/mL) infusion  0.3 mcg/kg/min Intravenous To OR Jesusmanuel Erbes, Merri Brunette, MD       nitroGLYCERIN (NITROSTAT) 0.4 MG SL tablet            [MAR Hold] nitroGLYCERIN (NITROSTAT) SL tablet 0.4 mg  0.4 mg Sublingual Q5 min PRN Lennette Bihari, MD   0.4 mg at 10/15/20 6144   nitroGLYCERIN 50 mg in dextrose 5 % 250 mL (0.2 mg/mL) infusion  2-200 mcg/min Intravenous To OR Tekeisha Hakim, Merri Brunette, MD       norepinephrine (LEVOPHED) 4mg  in premix infusion  0-40 mcg/min Intravenous To OR Phillip Maffei, , MD       [MAR Hold] ondansetron (ZOFRAN) injection 4 mg  4 mg Intravenous Q6H PRN Merri Brunette, MD       phenylephrine (NEOSYNEPHRINE) 20-0.9 MG/250ML-% infusion  30-200 mcg/min Intravenous To OR Caliah Kopke, Lennette Bihari, MD       potassium chloride injection 80 mEq  80 mEq Other To OR Tyia Binford, Merri Brunette, MD       [MAR Hold] ranolazine (RANEXA) 12 hr tablet 1,000 mg  1,000 mg Oral BID Merri Brunette, MD   1,000 mg at 10/14/20 2229   [facility] Hold] sodium chloride flush (NS) 0.9 % injection 3 mL  3 mL Intravenous Q12H [facility], MD   3 mL at 10/14/20 2230   [MAR Hold] sodium chloride flush (NS) 0.9 %  injection 3 mL  3 mL Intravenous Q12H 12/12/20, MD   3 mL at 10/14/20 2230   [MAR Hold] sodium chloride flush (NS) 0.9 % injection 3 mL  3 mL Intravenous PRN 12/12/20, MD       temazepam (RESTORIL) capsule 15 mg  15 mg Oral Once PRN Katherene Dinino, Lennette Bihari, MD       tranexamic acid (CYKLOKAPRON) 2,500 mg in sodium chloride 0.9 % 250 mL (10 mg/mL) infusion  1.5 mg/kg/hr Intravenous To OR Chistopher Mangino, Merri Brunette, MD       tranexamic acid (CYKLOKAPRON) bolus via infusion - over 30 minutes 1,497 mg  15 mg/kg Intravenous To OR Bazil Dhanani, Merri Brunette, MD       tranexamic acid (CYKLOKAPRON) pump prime solution 200 mg  2 mg/kg Intracatheter To OR Maxxwell Edgett, Merri Brunette, MD       vancomycin (VANCOREADY) IVPB 1500 mg/300 mL  1,500 mg Intravenous To OR Kijuana Ruppel, Merri Brunette, MD        Medications Prior to Admission  Medication Sig Dispense Refill Last Dose   albuterol (PROVENTIL HFA;VENTOLIN HFA) 108 (90 Base) MCG/ACT inhaler Inhale 2 puffs into the lungs every 4 (four) hours  as needed for wheezing or shortness of breath (cough, shortness of breath or wheezing.). 1 Inhaler 5 Past Month at Unknown time   amLODipine (NORVASC) 5 MG tablet Take 1 tablet (5 mg total) by mouth daily. 30 tablet 6 10/14/2020 at 0430   aspirin EC 81 MG tablet Take 81 mg by mouth every morning.   10/14/2020 at 0430   budesonide-formoterol (SYMBICORT) 160-4.5 MCG/ACT inhaler Inhale 2 puffs into the lungs 2 (two) times daily. (Patient taking differently: Inhale 2 puffs into the lungs 2 (two) times daily as needed (asthma).) 1 each 11 Past Month at Unknown time   clopidogrel (PLAVIX) 75 MG tablet TAKE 1 TABLET BY MOUTH  DAILY (Patient taking differently: Take 75 mg by mouth daily.) 90 tablet 3 10/10/2020 at Unknown time   furosemide (LASIX) 20 MG tablet Take 1 tablet (20 mg total) by mouth daily as needed (SWELLING). 30 tablet 6 10/13/2020 at Unknown time   isosorbide mononitrate (IMDUR) 60 MG 24 hr tablet Take to 2 tablet ( 120 mg) in the  morning and  1 tablet (60 mg ) at bedtime (Patient taking differently: Take 60-120 mg by mouth See admin instructions. Take to 2 tablet ( 120 mg) in the morning and  1 tablet (60 mg ) at bedtime) 90 tablet 6 10/14/2020 at 0430   metoprolol succinate (TOPROL-XL) 25 MG 24 hr tablet Take 1.5 tablets (37.5 mg total) by mouth daily. 135 tablet 1 10/14/2020 at 0430   nitroGLYCERIN (NITROSTAT) 0.4 MG SL tablet PLACE 1 TABLET UNDER THE TONGUE EVERY 5 MINUTES AS NEEDED FOR CHEST PAIN UP TO 3 DOSES, IF SYMPTOMS PERSIST CALL 911 (Patient taking differently: Place 0.4 mg under the tongue every 5 (five) minutes as needed for chest pain.) 25 tablet 6 10/14/2020 at 0600   ramipril (ALTACE) 10 MG capsule TAKE 1 CAPSULE BY MOUTH  DAILY (Patient taking differently: Take 10 mg by mouth daily.) 90 capsule 3 10/14/2020 at 0430   ranolazine (RANEXA) 1000 MG SR tablet Take 1 tablet (1,000 mg total) by mouth 2 (two) times daily. 60 tablet 11 10/14/2020 at 0430   colchicine 0.6 MG tablet Take 0.6 mg by mouth 2 (two) times daily as needed (gout).   More than a month at Unknown time   Evolocumab (REPATHA SURECLICK) 194 MG/ML SOAJ Inject 140 mg into the skin every 14 (fourteen) days. (Patient not taking: No sig reported) 2 pen 11 Not Taking at Unknown time    Family History  Problem Relation Age of Onset   CAD Father        CABG with subsequent stroke     Review of Systems:   ROS Pertinent items noted in HPI and remainder of comprehensive ROS otherwise negative.     Cardiac Review of Systems: Y or  [    ]= no  Chest Pain [    ]  Resting SOB [   ] Exertional SOB  [  ]  Orthopnea [  ]   Pedal Edema [   ]    Palpitations [  ] Syncope  [  ]   Presyncope [   ]  General Review of Systems: [Y] = yes [  ]=no Constitional: recent weight change [  ]; anorexia [  ]; fatigue [  ]; nausea [  ]; night sweats [  ]; fever [  ]; or chills [  ]  Dental: Last Dentist visit:    Eye : blurred vision [  ]; diplopia [   ]; vision changes [  ];  Amaurosis fugax[  ]; Resp: cough [  ];  wheezing[  ];  hemoptysis[  ]; shortness of breath[  ]; paroxysmal nocturnal dyspnea[  ]; dyspnea on exertion[  ]; or orthopnea[  ];  GI:  gallstones[  ], vomiting[  ];  dysphagia[  ]; melena[  ];  hematochezia [  ]; heartburn[  ];   Hx of  Colonoscopy[  ]; GU: kidney stones [  ]; hematuria[  ];   dysuria [  ];  nocturia[  ];  history of     obstruction [  ]; urinary frequency [  ]             Skin: rash, swelling[  ];, hair loss[  ];  peripheral edema[  ];  or itching[  ]; Musculosketetal: myalgias[  ];  joint swelling[  ];  joint erythema[  ];  joint pain[  ];  back pain[  ];  Heme/Lymph: bruising[  ];  bleeding[  ];  anemia[  ];  Neuro: TIA[  ];  headaches[  ];  stroke[  ];  vertigo[  ];  seizures[  ];   paresthesias[  ];  difficulty walking[  ];  Psych:depression[  ]; anxiety[  ];  Endocrine: diabetes[  ];  thyroid dysfunction[  ];      Physical Exam: BP 126/62 (BP Location: Left Arm)    Pulse (!) 54    Temp 97.8 F (36.6 C) (Oral)    Resp 20    Ht 5\' 8"  (1.727 m)    Wt 99.8 kg    SpO2 92%    BMI 33.45 kg/m    General appearance: alert and cooperative Head: Normocephalic, without obvious abnormality, atraumatic Neck: no adenopathy, no carotid bruit, no JVD, supple, symmetrical, trachea midline and thyroid not enlarged, symmetric, no tenderness/mass/nodules Resp: rales bibasilar Cardio: regular rate and rhythm, S1, S2 normal, no murmur, click, rub or gallop GI: soft, non-tender; bowel sounds normal; no masses,  no organomegaly Extremities: edema 1+ pedal Neurologic: Alert and oriented X 3, normal strength and tone. Normal symmetric reflexes. Normal coordination and gait  Diagnostic Studies & Laboratory data:     Recent Radiology Findings:   CT CHEST WO CONTRAST  Result Date: 10/14/2020 CLINICAL DATA:  Thoracic aortic disease. Preop for planning coronary artery bypass graft.  EXAM: CT CHEST WITHOUT CONTRAST TECHNIQUE: Multidetector CT imaging of the chest was performed following the standard protocol without IV contrast. COMPARISON:  None. FINDINGS: Cardiovascular: Atherosclerosis of thoracic aorta is noted without aneurysm formation. Normal cardiac size. No pericardial effusion. Mild coronary artery calcifications are noted. Probable stent seen in right coronary artery. Mediastinum/Nodes: No enlarged mediastinal or axillary lymph nodes. Thyroid gland, trachea, and esophagus demonstrate no significant findings. Lungs/Pleura: No pneumothorax or pleural effusion is noted. 3 mm nodule is noted in right upper lobe best seen on image number 76 of series 4. 4 mm nodule is noted in left upper lobe best seen on image number 86 of series 4. 5 mm nodule is noted in left upper lobe best seen on image number 46 of series 4. 3 mm nodule is noted in right middle lobe best seen on image number 94 series 4. Minimal subsegmental atelectasis is noted in the right lower lobe. Upper Abdomen: No acute abnormality. Musculoskeletal: No chest wall mass or suspicious bone lesions identified. IMPRESSION: 1. Atherosclerosis of  thoracic aorta is noted without aneurysm formation. 2. Mild coronary artery calcifications are noted suggesting coronary artery disease. 3. Minimal subsegmental atelectasis is noted in the right lower lobe. 4. Multiple bilateral pulmonary nodules are noted, the largest measuring 5 mm in the left upper lobe. No follow-up needed if patient is low-risk (and has no known or suspected primary neoplasm). Non-contrast chest CT can be considered in 12 months if patient is high-risk. This recommendation follows the consensus statement: Guidelines for Management of Incidental Pulmonary Nodules Detected on CT Images: From the Fleischner Society 2017; Radiology 2017; 284:228-243. Aortic Atherosclerosis (ICD10-I70.0). Electronically Signed   By: Marijo Conception M.D.   On: 10/14/2020 16:44   CARDIAC  CATHETERIZATION  Addendum Date: 10/14/2020    1st Diag lesion is 90% stenosed.  Mid LAD lesion is 70% stenosed.  Prox RCA to Mid RCA lesion is 85% stenosed.  Prox RCA lesion is 80% stenosed with 30% stenosed side branch in RV Branch.  Mid RCA to Dist RCA lesion is 70% stenosed.  Ost LM lesion is 45% stenosed.  Ost RCA to Prox RCA lesion is 90% stenosed.  Ost Cx lesion is 99% stenosed.  Severe multivessel progressive CAD with 40 to 50% ostial left main stenosis, diffuse 95% stenosis in the first diagonal branch of the LAD with 70% LAD stenosis beyond this diagonal vessel; 99% ostial left circumflex stenosis and progressive 85% in-stent restenosis in a dominant RCA with 90% stenosis at the initiation of the proximal stent with 70% stenosis proximal to the AV groove. Relatively preserved LV function with EF estimated at 50 to 55%.  LVEDP 22 mm. RECOMMENDATION: CABG revascularization surgery.  The patient has been on Plavix washout with his last dose on 10/10/2020.  Will initiate IV heparin 8 hours post procedure.  We will plan 2D echo Doppler study today.  Surgical consultation and hopeful CABG revascularization surgery tomorrow if schedule allows.   Result Date: 10/14/2020  1st Diag lesion is 90% stenosed.  Mid LAD lesion is 70% stenosed.  Prox RCA to Mid RCA lesion is 85% stenosed.  Prox RCA lesion is 80% stenosed with 30% stenosed side branch in RV Branch.  Mid RCA to Dist RCA lesion is 70% stenosed.  Ost LM lesion is 45% stenosed.  Ost RCA to Prox RCA lesion is 90% stenosed.  Ost Cx lesion is 99% stenosed.  Severe multivessel progressive CAD with 40 to 50% ostial left main stenosis, diffuse 95% stenosis in the first diagonal branch of the LAD with 70% LAD stenosis beyond this diagonal vessel; 99% ostial left circumflex stenosis and progressive 85% in-stent restenosis in a dominant RCA with 90% stenosis at the initiation of the proximal stent with 70% stenosis proximal to the AV groove. Relatively  preserved LV function with EF estimated at 50 to 55%.  LVEDP 22 mm. RECOMMENDATION: CABG revascularization surgery.  The patient has been on Plavix washout with his last dose on 10/10/2020.  We will plan 2D echo Doppler study today.  Surgical consultation and hopeful CABG revascularization surgery tomorrow if schedule allows.   DG CHEST PORT 1 VIEW  Result Date: 10/14/2020 CLINICAL DATA:  Preop for coronary artery bypass surgery. EXAM: PORTABLE CHEST 1 VIEW COMPARISON:  09/16/2020 FINDINGS: The iliac silhouette, mediastinal and hilar contours are within normal limits. The lungs are clear. No pleural effusions. No pulmonary lesions. The bony thorax is intact. IMPRESSION: No acute cardiopulmonary findings. Electronically Signed   By: Marijo Sanes M.D.   On: 10/14/2020 16:04  ECHOCARDIOGRAM COMPLETE  Result Date: 10/14/2020    ECHOCARDIOGRAM REPORT   Patient Name:   Jonathan [name] Date of Exam: 10/14/2020 Medical Rec #:  [id number]      Height:       68.0 in Accession #:    [id number]     Weight:       220.0 lb Date of Birth:  14-Sep-1950      BSA:          2.128 m Patient Age:    69 years       BP:           95/50 mmHg Patient Gender: M              HR:           50 bpm. Exam Location:  Inpatient Procedure: 2D Echo, Color Doppler and Cardiac Doppler Indications:    CAD Native Vessel i25.0  History:        Patient has prior history of Echocardiogram examinations. CAD,                 COPD; Risk Factors:Hypertension, Dyslipidemia and Current                 Smoker.  Sonographer:    Irving Burton Senior RDCS Referring Phys: [phone] THOMAS A KELLY  Sonographer Comments: Very poor echo windows due to COPD and lung interference. IMPRESSIONS  1. Technically diffcult study, very limited views  2. Left ventricular ejection fraction, by estimation, is 55 to 60%. The left ventricle has grossly normal function. Left ventricular endocardial border not optimally defined to evaluate regional wall motion. Left ventricular diastolic  parameters are indeterminate.  3. Right ventricular systolic function is normal. The right ventricular size is normal. Tricuspid regurgitation signal is inadequate for assessing PA pressure.  4. The mitral valve is grossly normal. No evidence of mitral valve regurgitation.  5. The aortic valve was not well visualized. Aortic valve regurgitation is not visualized.  6. The inferior vena cava is normal in size with greater than 50% respiratory variability, suggesting right atrial pressure of 3 mmHg. FINDINGS  Left Ventricle: Left ventricular ejection fraction, by estimation, is 55 to 60%. The left ventricle has normal function. Left ventricular endocardial border not optimally defined to evaluate regional wall motion. The left ventricular internal cavity size was normal in size. There is no left ventricular hypertrophy. Left ventricular diastolic parameters are indeterminate. Right Ventricle: The right ventricular size is normal. No increase in right ventricular wall thickness. Right ventricular systolic function is normal. Tricuspid regurgitation signal is inadequate for assessing PA pressure. Left Atrium: Left atrial size was not well visualized. Right Atrium: Right atrial size was not well visualized. Pericardium: Trivial pericardial effusion is present. Presence of pericardial fat pad. Mitral Valve: The mitral valve is grossly normal. No evidence of mitral valve regurgitation. Tricuspid Valve: The tricuspid valve is grossly normal. Tricuspid valve regurgitation is not demonstrated. Aortic Valve: The aortic valve was not well visualized. Aortic valve regurgitation is not visualized. Pulmonic Valve: The pulmonic valve was not well visualized. Pulmonic valve regurgitation not well visualized. Aorta: The aortic root was not well visualized. Venous: The inferior vena cava is normal in size with greater than 50% respiratory variability, suggesting right atrial pressure of 3 mmHg. IAS/Shunts: The interatrial septum was  not well visualized.   Diastology LV e' medial:    5.55 cm/s LV E/e' medial:  11.7 LV e' lateral:   8.16 cm/s LV E/e' lateral:  8.0  RIGHT VENTRICLE RV S prime:     8.98 cm/s TAPSE (M-mode): 1.7 cm LEFT ATRIUM             Index       RIGHT ATRIUM           Index LA Vol (A2C):   59.9 ml 28.15 ml/m RA Area:     17.10 cm LA Vol (A4C):   39.0 ml 18.33 ml/m RA Volume:   56.20 ml  26.41 ml/m LA Biplane Vol: 48.1 ml 22.60 ml/m  AORTIC VALVE LVOT Vmax:   106.00 cm/s LVOT Vmean:  70.400 cm/s LVOT VTI:    0.291 m MITRAL VALVE MV Area (PHT): 2.48 cm    SHUNTS MV Decel Time: 306 msec    Systemic VTI: 0.29 m MV E velocity: 65.10 cm/s MV A velocity: 75.00 cm/s MV E/A ratio:  0.87 Oswaldo Milian MD Electronically signed by Oswaldo Milian MD Signature Date/Time: 10/14/2020/8:41:58 PM    Final    VAS US DOPPLER PRE CABG  Result Date: 10/14/2020 PREOPERATIVE VASCULAR EVALUATION  Indications:      Pre-CABG. Risk Factors:     Hypertension, hyperlipidemia, prior MI, coronary artery                   disease. Comparison Study: 07/23/19 previous Performing Technologist: Abram Sander RVS  Examination Guidelines: A complete evaluation includes B-mode imaging, spectral Doppler, color Doppler, and power Doppler as needed of all accessible portions of each vessel. Bilateral testing is considered an integral part of a complete examination. Limited examinations for reoccurring indications may be performed as noted.  Right Carotid Findings: +----------+--------+--------+--------+------------+--------+             PSV cm/s EDV cm/s Stenosis Describe     Comments  +----------+--------+--------+--------+------------+--------+  CCA Prox   45       13                heterogenous           +----------+--------+--------+--------+------------+--------+  CCA Distal 44       10                heterogenous           +----------+--------+--------+--------+------------+--------+  ICA Prox   86       19       1-39%    heterogenous            +----------+--------+--------+--------+------------+--------+  ICA Distal 61       19                                       +----------+--------+--------+--------+------------+--------+  ECA        67       11                                       +----------+--------+--------+--------+------------+--------+ Portions of this table do not appear on this page. +----------+--------+-------+--------+------------+             PSV cm/s EDV cms Describe Arm Pressure  +----------+--------+-------+--------+------------+  Subclavian 81                                      +----------+--------+-------+--------+------------+ +---------+--------+--+--------+--+---------+  Vertebral PSV cm/s 34 EDV cm/s 12 Antegrade  +---------+--------+--+--------+--+---------+ Left Carotid Findings: +----------+--------+--------+--------+------------+--------+             PSV cm/s EDV cm/s Stenosis Describe     Comments  +----------+--------+--------+--------+------------+--------+  CCA Prox   63       17                heterogenous           +----------+--------+--------+--------+------------+--------+  CCA Distal 51       12                heterogenous           +----------+--------+--------+--------+------------+--------+  ICA Prox   86       23       1-39%    heterogenous           +----------+--------+--------+--------+------------+--------+  ICA Distal 76       16                                       +----------+--------+--------+--------+------------+--------+  ECA        69       12                                       +----------+--------+--------+--------+------------+--------+ +----------+--------+--------+--------+------------+  Subclavian PSV cm/s EDV cm/s Describe Arm Pressure  +----------+--------+--------+--------+------------+             76                                       +----------+--------+--------+--------+------------+ +---------+--------+--+--------+--+---------+  Vertebral PSV cm/s 58 EDV cm/s 22 Antegrade   +---------+--------+--+--------+--+---------+  ABI Findings: +--------+------------------+-----+---------+--------+  Right    Rt Pressure (mmHg) Index Waveform  Comment   +--------+------------------+-----+---------+--------+  Brachial                          triphasic           +--------+------------------+-----+---------+--------+  ATA                               triphasic           +--------+------------------+-----+---------+--------+  PTA                               biphasic            +--------+------------------+-----+---------+--------+ +--------+------------------+-----+---------+-------+  Left     Lt Pressure (mmHg) Index Waveform  Comment  +--------+------------------+-----+---------+-------+  Brachial                          triphasic          +--------+------------------+-----+---------+-------+  ATA                               triphasic          +--------+------------------+-----+---------+-------+  PTA  biphasic           +--------+------------------+-----+---------+-------+  Right Doppler Findings: +--------+--------+-----+---------+--------+  Site     Pressure Index Doppler   Comments  +--------+--------+-----+---------+--------+  Brachial                triphasic           +--------+--------+-----+---------+--------+  Radial                  triphasic           +--------+--------+-----+---------+--------+  Ulnar                   triphasic           +--------+--------+-----+---------+--------+  Left Doppler Findings: +--------+--------+-----+---------+--------+  Site     Pressure Index Doppler   Comments  +--------+--------+-----+---------+--------+  Brachial                triphasic           +--------+--------+-----+---------+--------+  Radial                  triphasic           +--------+--------+-----+---------+--------+  Ulnar                   triphasic           +--------+--------+-----+---------+--------+  Summary: Right Carotid: Velocities in the right  ICA are consistent with a 1-39% stenosis. Left Carotid: Velocities in the left ICA are consistent with a 1-39% stenosis. Vertebrals: Bilateral vertebral arteries demonstrate antegrade flow. Right Upper Extremity: Doppler waveform obliterate with right radial compression. Doppler waveform obliterate with right ulnar compression. Left Upper Extremity: Doppler waveform obliterate with left radial compression. Doppler waveforms decrease 50% with left ulnar compression.  Electronically signed by Coral Else MD on 10/14/2020 at 7:14:49 PM.    Final      I have independently reviewed the above radiologic studies and discussed with the patient   Recent Lab Findings: Lab Results  Component Value Date   WBC 7.7 10/15/2020   HGB 13.2 10/15/2020   HCT 40.4 10/15/2020   PLT 193 10/15/2020   GLUCOSE 97 10/15/2020   CHOL 288 (H) 07/21/2019   TRIG 79 07/21/2019   HDL 76 07/21/2019   LDLCALC 196 (H) 07/21/2019   ALT 13 10/15/2020   AST 19 10/15/2020   NA 136 10/15/2020   K 5.1 10/15/2020   CL 103 10/15/2020   CREATININE 1.45 (H) 10/15/2020   BUN 13 10/15/2020   CO2 25 10/15/2020   INR 1.2 10/15/2020   HGBA1C 5.6 10/15/2020      Assessment / Plan:      CABG on 10/15/20    I  spent 30 minutes counseling the patient face to face.   Mahnoor Mathisen Z. Vickey Sages, MD [phone] 10/15/2020 7:05 AM     \

## 2020-10-15 NOTE — Progress Notes (Signed)
Upon arrival to holding area pt complained of 6/10 chest pain. Discussed with Dr. Irven Easterly. Sublingual nitro given, pain decreased to 0/10. 12-lead EKG performed

## 2020-10-15 NOTE — Progress Notes (Signed)
[place] for heparin Indication: chest pain/ACS   Labs: Recent Labs    10/15/20 0051  HGB 13.2  HCT 40.4  PLT 193  APTT 40*  LABPROT 14.3  INR 1.2  HEPARINUNFRC <0.10*  CREATININE 1.45*    Assessment: 56 yom who underwent cardiac cath on 2/2 finding severe multivessel CAD - plan for CABG evaluation, plavix washouted (last dose 10/10/2020). No other AC PTA.   Plan to start heparin 8 hours after sheath removed (documented on 2/2@0839 ). No s/sx of bleeding.  Initial heparin level <0.1 units/ml.  For OHS later today  Goal of Therapy:  Heparin level 0.3-0.7 units/ml Monitor platelets by anticoagulation protocol: Yes   Plan:  Increase heparin infusion to 1300 units/hr Monitor CBC, HL, and for s/sx of bleeding  Thanks for allowing pharmacy to be a part of this patient's care.  Excell Seltzer, PharmD Clinical Pharmacist

## 2020-10-15 NOTE — Progress Notes (Deleted)
  Echocardiogram 2D Echocardiogram has been performed.  Jonathan [name] 10/15/2020, 1:16 PM

## 2020-10-15 NOTE — Discharge Instructions (Signed)

## 2020-10-15 NOTE — H&P (Signed)
History and Physical Interval Note:  10/15/2020 7:05 AM  Jonathan [name]  has presented today for surgery, with the diagnosis of CAD.  The various methods of treatment have been discussed with the patient and family. After consideration of risks, benefits and other options for treatment, the patient has consented to  Procedure(s) with comments: CORONARY ARTERY BYPASS GRAFTING (CABG) (N/A) - possible BIMA possible RADIAL ARTERY HARVEST (Left) TRANSESOPHAGEAL ECHOCARDIOGRAM (TEE) (N/A) INDOCYANINE GREEN FLUORESCENCE IMAGING (ICG) (N/A) as a surgical intervention.  The patient's history has been reviewed, patient examined, no change in status, stable for surgery.  I have reviewed the patient's chart and labs.  Questions were answered to the patient's satisfaction.     [name]

## 2020-10-16 ENCOUNTER — Inpatient Hospital Stay (HOSPITAL_COMMUNITY): Payer: Medicare Other

## 2020-10-16 ENCOUNTER — Encounter (HOSPITAL_COMMUNITY): Payer: Self-pay | Admitting: Cardiothoracic Surgery

## 2020-10-16 LAB — BASIC METABOLIC PANEL
Anion gap: 11 (ref 5–15)
Anion gap: 9 (ref 5–15)
BUN: 11 mg/dL (ref 8–23)
BUN: 15 mg/dL (ref 8–23)
CO2: 20 mmol/L — ABNORMAL LOW (ref 22–32)
CO2: 23 mmol/L (ref 22–32)
Calcium: 7.5 mg/dL — ABNORMAL LOW (ref 8.9–10.3)
Calcium: 7.5 mg/dL — ABNORMAL LOW (ref 8.9–10.3)
Chloride: 104 mmol/L (ref 98–111)
Chloride: 105 mmol/L (ref 98–111)
Creatinine, Ser: 1.28 mg/dL — ABNORMAL HIGH (ref 0.61–1.24)
Creatinine, Ser: 1.33 mg/dL — ABNORMAL HIGH (ref 0.61–1.24)
GFR, Estimated: 60 mL/min — ABNORMAL LOW (ref >60)
GFR, Estimated: 57 mL/min — ABNORMAL LOW (ref >60)
Glucose, Bld: 153 mg/dL — ABNORMAL HIGH (ref 70–99)
Glucose, Bld: 130 mg/dL — ABNORMAL HIGH (ref 70–99)
Potassium: 4.2 mmol/L (ref 3.5–5.1)
Potassium: 4.7 mmol/L (ref 3.5–5.1)
Sodium: 135 mmol/L (ref 135–145)
Sodium: 137 mmol/L (ref 135–145)

## 2020-10-16 LAB — CBC
HCT: 37.6 % — ABNORMAL LOW (ref 39.0–52.0)
HCT: 34.2 % — ABNORMAL LOW (ref 39.0–52.0)
Hemoglobin: 12.3 g/dL — ABNORMAL LOW (ref 13.0–17.0)
Hemoglobin: 11.6 g/dL — ABNORMAL LOW (ref 13.0–17.0)
MCH: 33 pg (ref 26.0–34.0)
MCH: 33.7 pg (ref 26.0–34.0)
MCHC: 32.7 g/dL (ref 30.0–36.0)
MCHC: 33.9 g/dL (ref 30.0–36.0)
MCV: 100.8 fL — ABNORMAL HIGH (ref 80.0–100.0)
MCV: 99.4 fL (ref 80.0–100.0)
Platelets: 153 10*3/uL (ref 150–400)
Platelets: 121 10*3/uL — ABNORMAL LOW (ref 150–400)
RBC: 3.73 MIL/uL — ABNORMAL LOW (ref 4.22–5.81)
RBC: 3.44 MIL/uL — ABNORMAL LOW (ref 4.22–5.81)
RDW: 12.2 % (ref 11.5–15.5)
RDW: 12.4 % (ref 11.5–15.5)
WBC: 18.7 10*3/uL — ABNORMAL HIGH (ref 4.0–10.5)
WBC: 19 10*3/uL — ABNORMAL HIGH (ref 4.0–10.5)
nRBC: 0 % (ref 0.0–0.2)
nRBC: 0 % (ref 0.0–0.2)

## 2020-10-16 LAB — POCT I-STAT 7, (LYTES, BLD GAS, ICA,H+H)
Acid-base deficit: 3 mmol/L — ABNORMAL HIGH (ref 0.0–2.0)
Acid-base deficit: 2 mmol/L (ref 0.0–2.0)
Bicarbonate: 23.2 mmol/L (ref 20.0–28.0)
Bicarbonate: 25.3 mmol/L (ref 20.0–28.0)
Calcium, Ion: 1.11 mmol/L — ABNORMAL LOW (ref 1.15–1.40)
Calcium, Ion: 1.16 mmol/L (ref 1.15–1.40)
HCT: 35 % — ABNORMAL LOW (ref 39.0–52.0)
HCT: 33 % — ABNORMAL LOW (ref 39.0–52.0)
Hemoglobin: 11.9 g/dL — ABNORMAL LOW (ref 13.0–17.0)
Hemoglobin: 11.2 g/dL — ABNORMAL LOW (ref 13.0–17.0)
O2 Saturation: 97 %
O2 Saturation: 93 %
Patient temperature: 36.5
Patient temperature: 99
Potassium: 4.3 mmol/L (ref 3.5–5.1)
Potassium: 4.7 mmol/L (ref 3.5–5.1)
Sodium: 137 mmol/L (ref 135–145)
Sodium: 139 mmol/L (ref 135–145)
TCO2: 25 mmol/L (ref 22–32)
TCO2: 27 mmol/L (ref 22–32)
pCO2 arterial: 43.6 mmHg (ref 32.0–48.0)
pCO2 arterial: 53.1 mmHg — ABNORMAL HIGH (ref 32.0–48.0)
pH, Arterial: 7.331 — ABNORMAL LOW (ref 7.350–7.450)
pH, Arterial: 7.287 — ABNORMAL LOW (ref 7.350–7.450)
pO2, Arterial: 93 mmHg (ref 83.0–108.0)
pO2, Arterial: 77 mmHg — ABNORMAL LOW (ref 83.0–108.0)

## 2020-10-16 LAB — GLUCOSE, CAPILLARY
Glucose-Capillary: 161 mg/dL — ABNORMAL HIGH (ref 70–99)
Glucose-Capillary: 89 mg/dL (ref 70–99)
Glucose-Capillary: 153 mg/dL — ABNORMAL HIGH (ref 70–99)
Glucose-Capillary: 138 mg/dL — ABNORMAL HIGH (ref 70–99)
Glucose-Capillary: 132 mg/dL — ABNORMAL HIGH (ref 70–99)
Glucose-Capillary: 131 mg/dL — ABNORMAL HIGH (ref 70–99)
Glucose-Capillary: 116 mg/dL — ABNORMAL HIGH (ref 70–99)

## 2020-10-16 LAB — COOXEMETRY PANEL
Carboxyhemoglobin: 1 % (ref 0.5–1.5)
Methemoglobin: 0.8 % (ref 0.0–1.5)
O2 Saturation: 73.1 %
Total hemoglobin: 13.1 g/dL (ref 12.0–16.0)

## 2020-10-16 LAB — MAGNESIUM
Magnesium: 2.3 mg/dL (ref 1.7–2.4)
Magnesium: 2.3 mg/dL (ref 1.7–2.4)

## 2020-10-16 LAB — POCT ACTIVATED CLOTTING TIME: Activated Clotting Time: 148 seconds

## 2020-10-16 IMAGING — DX DG CHEST 1V PORT
1 series · 1 of 1 positions shown · non-contrast
Comparison: [DATE]

CLINICAL DATA: Evaluate support apparatus after open heart surgery.

EXAM:
PORTABLE CHEST 1 VIEW

[chest]
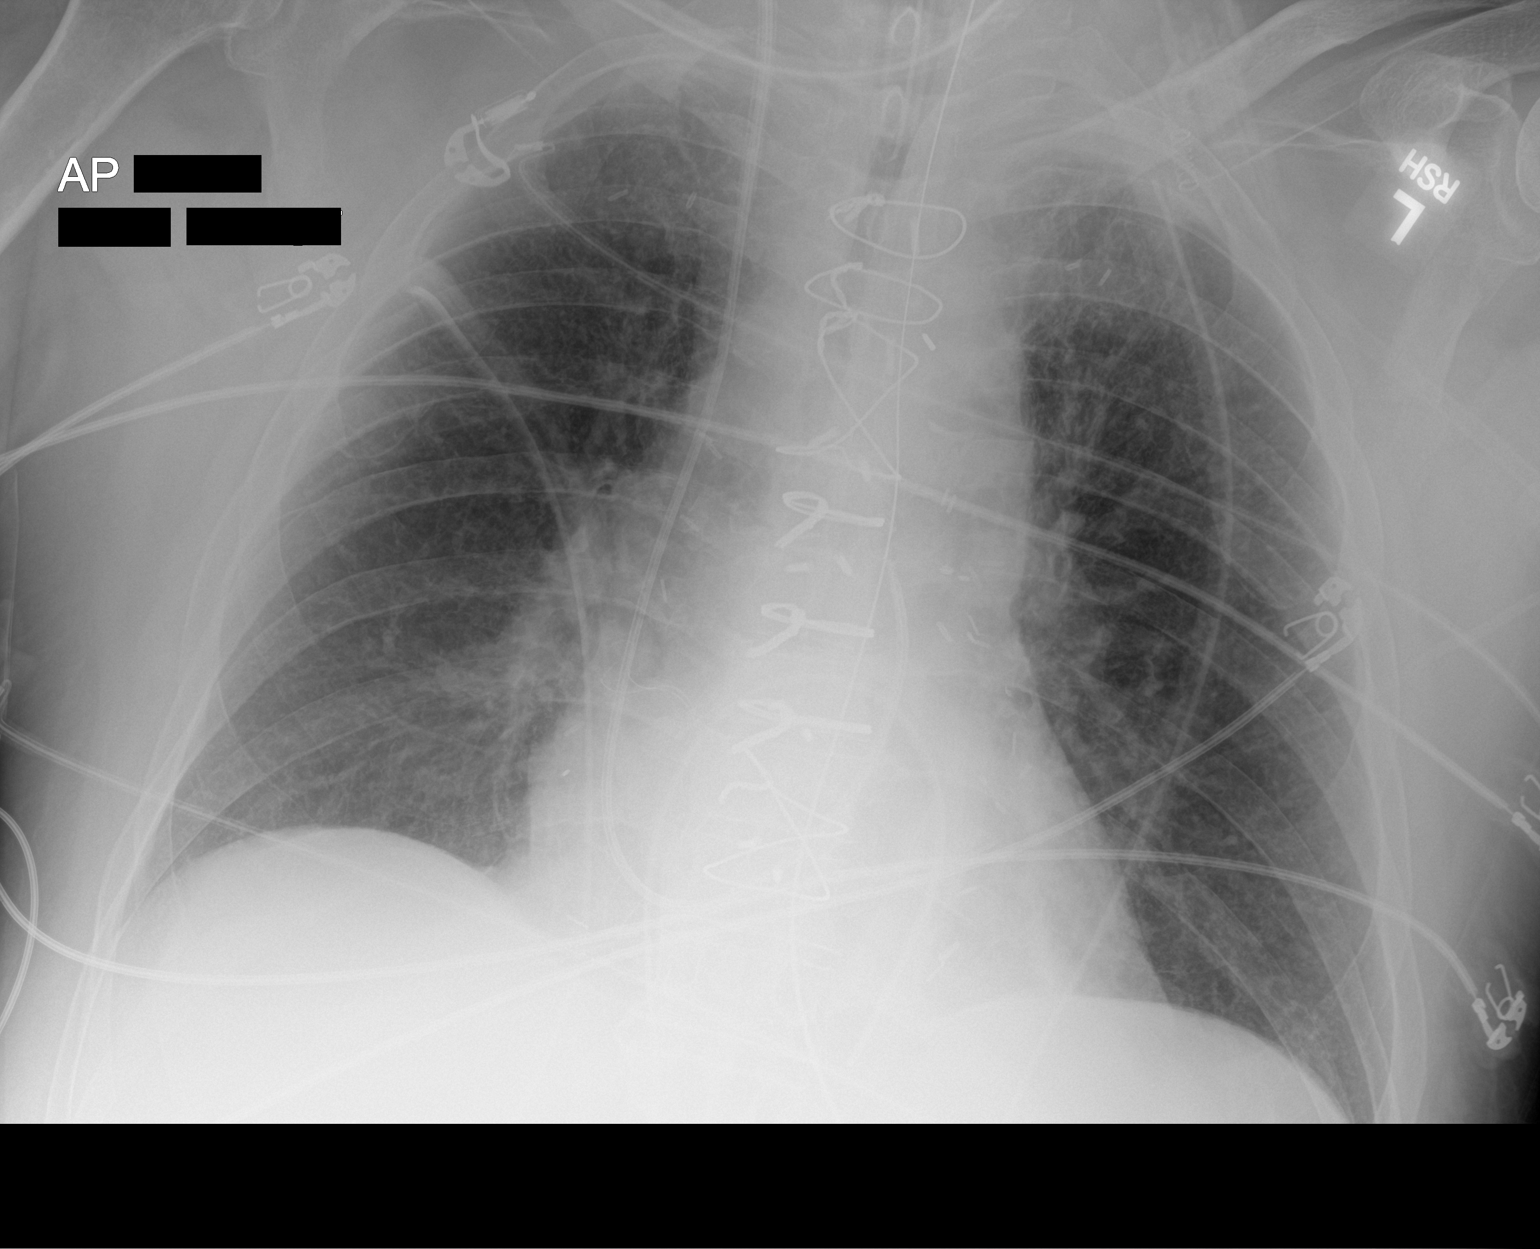

[1 of 1 positions shown; findings below may reference images not displayed]

FINDINGS: The radiopaque tip of the intra-aortic balloon pump is in the distal
third of the aortic arch. The distal tip should be near the AP
window. Recommend withdrawing 3 cm. The ETT terminates 3 cm below
the thoracic inlet and 9.7 cm above the carina. Chest tubes are
stable. The PA catheter distal tip is near the outflow tract. The
cardiomediastinal silhouette is stable. No pneumothorax. The lungs
are clear.
IMPRESSION: 1. The distal tip of the intra-aortic balloon pump is in the distal
third of the aortic arch. The distal tip should be near the AP
window. Recommend withdrawing 3 cm.
2. The remainder of the support apparatus appears to be in adequate
position. The distal tip of the ET tube is 3 cm below the thoracic
inlet and 9.7 cm above the carina.
3. No edema.  No other acute abnormalities.

These results will be called to the ordering clinician or
representative by the Radiologist Assistant, and communication
documented in the PACS or [REDACTED].

## 2020-10-16 MED ORDER — SODIUM CHLORIDE 0.9 % IV SOLN: INTRAVENOUS | Status: DC | PRN

## 2020-10-16 MED ORDER — PROPOFOL 1000 MG/100ML IV EMUL
5.0000 ug/kg/min | INTRAVENOUS | Status: DC
  Administered 2020-10-16: 20 ug/kg/min via INTRAVENOUS
  Administered 2020-10-17: 25 ug/kg/min via INTRAVENOUS
  Administered 2020-10-17: 20 ug/kg/min via INTRAVENOUS
  Filled 2020-10-16 (×2): qty 100

## 2020-10-16 MED ORDER — COLCHICINE 0.3 MG HALF TABLET
0.3000 mg | ORAL_TABLET | Freq: Two times a day (BID) | ORAL | Status: DC
  Administered 2020-10-16 (×2): 0.3 mg via ORAL
  Filled 2020-10-16 (×2): qty 1

## 2020-10-16 MED ORDER — NOREPINEPHRINE 16 MG/250ML-% IV SOLN
0.0000 ug/min | INTRAVENOUS | Status: DC
  Administered 2020-10-16: 7 ug/min via INTRAVENOUS
  Filled 2020-10-16 (×2): qty 250

## 2020-10-16 MED ORDER — NICOTINE 21 MG/24HR TD PT24
21.0000 mg | MEDICATED_PATCH | Freq: Every day | TRANSDERMAL | Status: DC
  Administered 2020-10-16 – 2020-10-25 (×10): 21 mg via TRANSDERMAL
  Filled 2020-10-16 (×10): qty 1

## 2020-10-16 MED ORDER — COLCHICINE 0.3 MG HALF TABLET
0.3000 mg | ORAL_TABLET | Freq: Two times a day (BID) | ORAL | Status: DC
  Administered 2020-10-17 – 2020-10-18 (×3): 0.3 mg
  Filled 2020-10-16 (×3): qty 1

## 2020-10-16 NOTE — Procedures (Signed)
Arterial Catheter Insertion Procedure Note  Jonathan [name]  [id number]  1950-10-02  Date:10/16/20  Time:9:43 PM    Provider Performing: Marlowe Aschoff    Procedure: Insertion of Arterial Line [phone]) with US guidance ([id number])   Indication(s) Blood pressure monitoring and/or need for frequent ABGs  Consent Unable to obtain consent due to emergent nature of procedure.  Anesthesia None   Time Out Verified patient identification, verified procedure, site/side was marked, verified correct patient position, special equipment/implants available, medications/allergies/relevant history reviewed, required imaging and test results available.   Sterile Technique Maximal sterile technique including full sterile barrier drape, hand hygiene, sterile gown, sterile gloves, mask, hair covering, sterile ultrasound probe cover (if used).   Procedure Description Area of catheter insertion was cleaned with chlorhexidine and draped in sterile fashion. With real-time ultrasound guidance an arterial catheter was placed into the left radial artery.  Appropriate arterial tracings confirmed on monitor.     Complications/Tolerance None; patient tolerated the procedure well.   EBL Minimal   Pt. Previous artline which was inserted into his right radial became dysfunctional and had to be removed. RT replaced the artline and placed into pt. Left radial artery. No issues.    Specimen(s) None

## 2020-10-16 NOTE — Progress Notes (Signed)
1 Day Post-Op Procedure(s) (LRB): CORONARY ARTERY BYPASS GRAFTING (CABG) TIMES FOUR USING BILATERAL INTERNAL MAMMARY ARTERIES AND ENDOSCOPICALLY HARVESTED GREATER SAPHENOUS VEIN AND INSERTION OF 50CC INTRAAORTIC BALLOON PUMP (N/A) TRANSESOPHAGEAL ECHOCARDIOGRAM (TEE) (N/A) INDOCYANINE GREEN FLUORESCENCE IMAGING (ICG) (N/A) ENDOVEIN HARVEST OF GREATER SAPHENOUS VEIN (Right) Subjective: sedated Objective: Vital signs in last 24 hours: Temp:  [96.3 F (35.7 C)-99.14 F (37.3 C)] 97.88 F (36.6 C) (02/04 0800) Pulse Rate:  [80-93] 92 (02/04 0800) Cardiac Rhythm: Atrial paced (02/04 0400) Resp:  [12-27] 22 (02/04 0800) BP: (94-121)/(46-90) 114/62 (02/04 0800) SpO2:  [76 %-100 %] 95 % (02/04 0800) Arterial Line BP: (76-141)/(35-73) 89/48 (02/04 0800) FiO2 (%):  [50 %] 50 % (02/04 0344) Weight:  [108.5 kg] 108.5 kg (02/04 0500)  Hemodynamic parameters for last 24 hours: PAP: (26-61)/(10-34) 41/10 CO:  [3.7 L/min-6.4 L/min] 6.4 L/min CI:  [1.8 L/min/m2-3 L/min/m2] 3 L/min/m2  Intake/Output from previous day: 02/03 0701 - 02/04 0700 In: 6797.7 [I.V.:4871; Blood:520; NG/GT:160; IV Piggyback:1246.8] Out: 3390 [Urine:2100; Blood:800; Chest Tube:490] Intake/Output this shift: No intake/output data recorded.  General appearance: sedated Neurologic: follows commands Heart: regular rate and rhythm, S1, S2 normal, no murmur, click, rub or gallop Lungs: clear to auscultation bilaterally Abdomen: soft, non-tender; bowel sounds normal; no masses,  no organomegaly Extremities: extremities normal, atraumatic, no cyanosis or edema Wound: dressed, dry  Lab Results: Recent Labs    10/15/20 1856 10/16/20 0435 10/16/20 0527  WBC 15.6* 18.7*  --   HGB 12.8* 12.3* 11.9*  HCT 37.8* 37.6* 35.0*  PLT 168 153  --    BMET:  Recent Labs    10/15/20 1856 10/16/20 0435 10/16/20 0527  NA 137 135 137  K 4.7 4.2 4.3  CL 105 104  --   CO2 23 20*  --   GLUCOSE 114* 153*  --   BUN 11 11  --    CREATININE 1.27* 1.28*  --   CALCIUM 7.7* 7.5*  --     PT/INR:  Recent Labs    10/15/20 1329  LABPROT 17.9*  INR 1.5*   ABG    Component Value Date/Time   PHART 7.331 (L) 10/16/2020 0527   HCO3 23.2 10/16/2020 0527   TCO2 25 10/16/2020 0527   ACIDBASEDEF 3.0 (H) 10/16/2020 0527   O2SAT 97.0 10/16/2020 0527   CBG (last 3)  Recent Labs    10/16/20 0009 10/16/20 0412 10/16/20 0413  GLUCAP 161* 89 153*    Assessment/Plan: S/P Procedure(s) (LRB): CORONARY ARTERY BYPASS GRAFTING (CABG) TIMES FOUR USING BILATERAL INTERNAL MAMMARY ARTERIES AND ENDOSCOPICALLY HARVESTED GREATER SAPHENOUS VEIN AND INSERTION OF 50CC INTRAAORTIC BALLOON PUMP (N/A) TRANSESOPHAGEAL ECHOCARDIOGRAM (TEE) (N/A) INDOCYANINE GREEN FLUORESCENCE IMAGING (ICG) (N/A) ENDOVEIN HARVEST OF GREATER SAPHENOUS VEIN (Right) Mobilize IABP out this am; extubate later this morning   LOS: 2 days    [name] 10/16/2020

## 2020-10-16 NOTE — Progress Notes (Signed)
IABP aspirated and removed from left femoral artery. Manual pressure applied for 30 minutes. Site level 0, no S+S of hematoma. Tegaderm dressing applied, patient intubated and sedated.   Bilateral dp and pt pulses present with doppler.   Bedrest begins at 15:15:00

## 2020-10-17 ENCOUNTER — Inpatient Hospital Stay (HOSPITAL_COMMUNITY): Payer: Medicare Other

## 2020-10-17 LAB — CBC
HCT: 33.8 % — ABNORMAL LOW (ref 39.0–52.0)
Hemoglobin: 11.4 g/dL — ABNORMAL LOW (ref 13.0–17.0)
MCH: 34.1 pg — ABNORMAL HIGH (ref 26.0–34.0)
MCHC: 33.7 g/dL (ref 30.0–36.0)
MCV: 101.2 fL — ABNORMAL HIGH (ref 80.0–100.0)
Platelets: UNDETERMINED 10*3/uL (ref 150–400)
RBC: 3.34 MIL/uL — ABNORMAL LOW (ref 4.22–5.81)
RDW: 12.6 % (ref 11.5–15.5)
WBC: 18.8 10*3/uL — ABNORMAL HIGH (ref 4.0–10.5)
nRBC: 0 % (ref 0.0–0.2)

## 2020-10-17 LAB — COOXEMETRY PANEL
Carboxyhemoglobin: 0.7 % (ref 0.5–1.5)
Methemoglobin: 0.9 % (ref 0.0–1.5)
O2 Saturation: 77.3 %
Total hemoglobin: 11.6 g/dL — ABNORMAL LOW (ref 12.0–16.0)

## 2020-10-17 LAB — BASIC METABOLIC PANEL
Anion gap: 10 (ref 5–15)
BUN: 14 mg/dL (ref 8–23)
CO2: 24 mmol/L (ref 22–32)
Calcium: 7.7 mg/dL — ABNORMAL LOW (ref 8.9–10.3)
Chloride: 104 mmol/L (ref 98–111)
Creatinine, Ser: 1.24 mg/dL (ref 0.61–1.24)
GFR, Estimated: >60 mL/min (ref >60)
Glucose, Bld: 154 mg/dL — ABNORMAL HIGH (ref 70–99)
Potassium: 4.4 mmol/L (ref 3.5–5.1)
Sodium: 138 mmol/L (ref 135–145)

## 2020-10-17 LAB — TRIGLYCERIDES: Triglycerides: 142 mg/dL (ref <150)

## 2020-10-17 LAB — GLUCOSE, CAPILLARY
Glucose-Capillary: 106 mg/dL — ABNORMAL HIGH (ref 70–99)
Glucose-Capillary: 117 mg/dL — ABNORMAL HIGH (ref 70–99)
Glucose-Capillary: 122 mg/dL — ABNORMAL HIGH (ref 70–99)
Glucose-Capillary: 125 mg/dL — ABNORMAL HIGH (ref 70–99)
Glucose-Capillary: 127 mg/dL — ABNORMAL HIGH (ref 70–99)
Glucose-Capillary: 70 mg/dL (ref 70–99)
Glucose-Capillary: 92 mg/dL (ref 70–99)
Glucose-Capillary: 93 mg/dL (ref 70–99)

## 2020-10-17 IMAGING — DX DG CHEST 1V PORT
1 series · 1 of 1 positions shown · non-contrast
Comparison: Yesterday

CLINICAL DATA: History of open heart surgery

EXAM:
PORTABLE CHEST 1 VIEW

[chest ap]
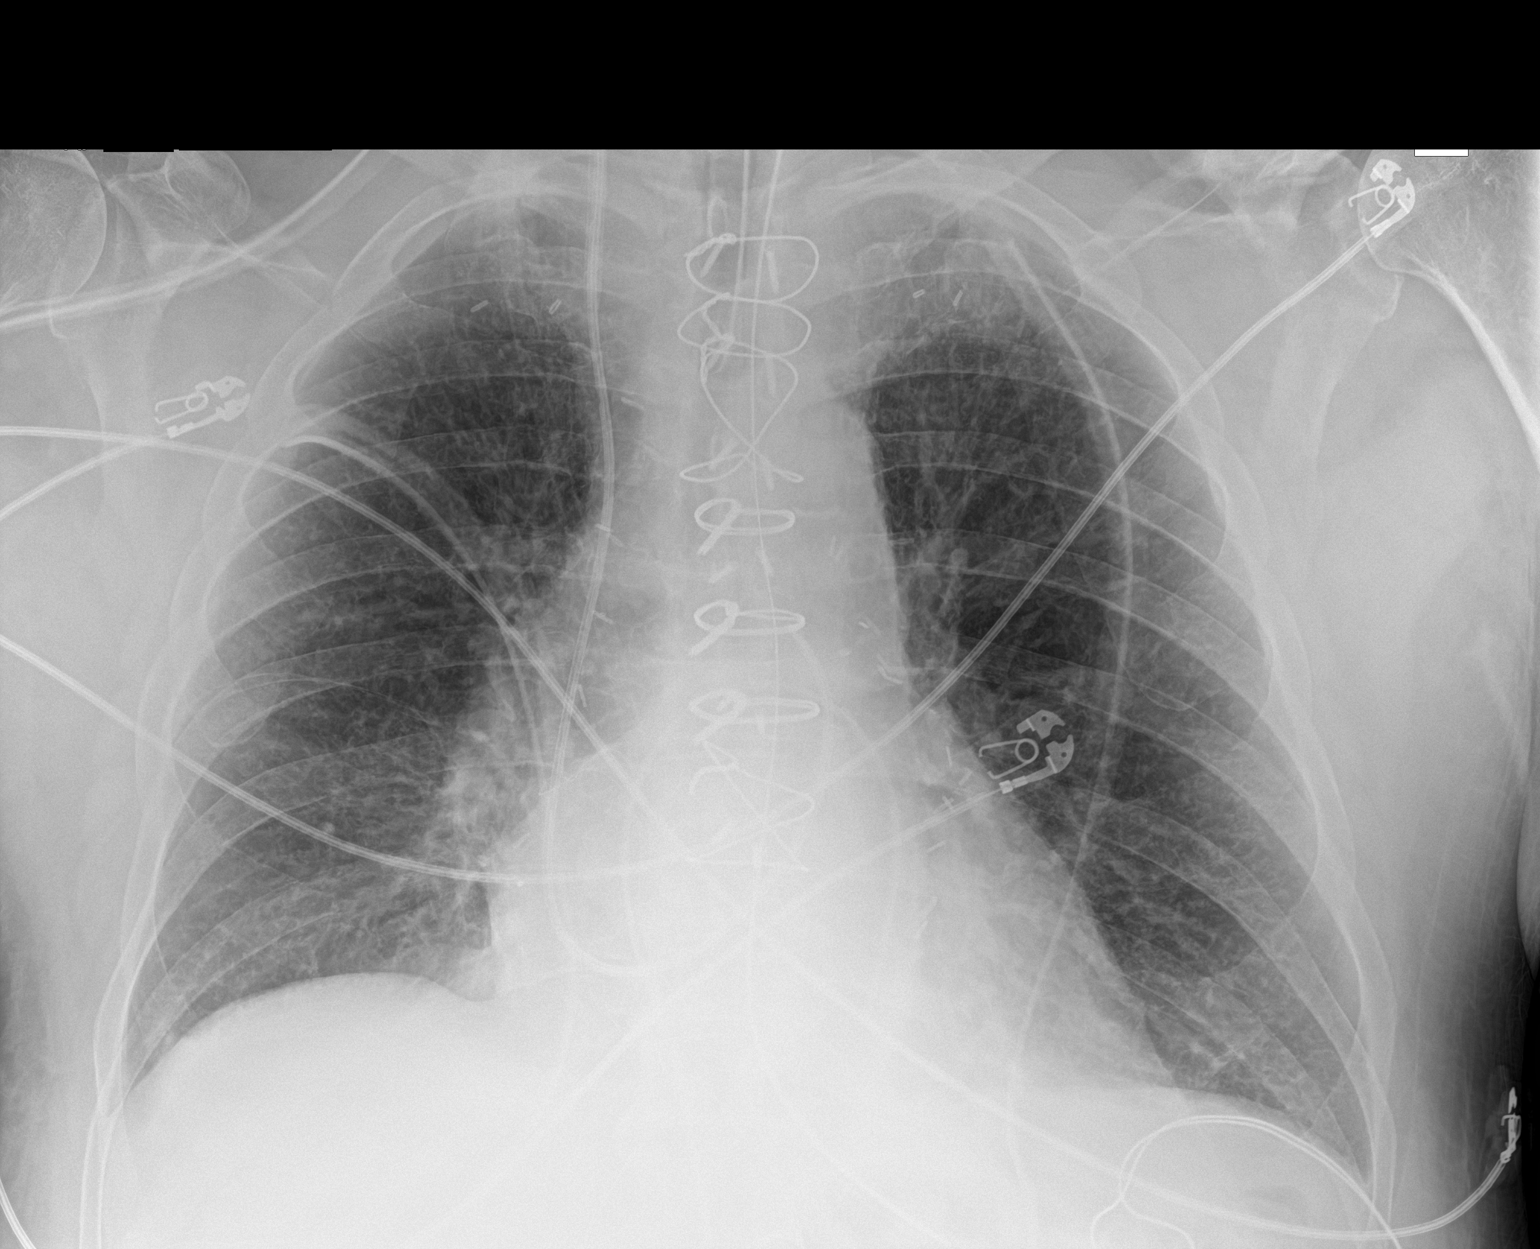

[1 of 1 positions shown; findings below may reference images not displayed]

FINDINGS: Endotracheal tube with tip just below the clavicular heads. The
enteric tube at least reaches the stomach. Swan-Ganz catheter from
the right with tip at the main pulmonary artery level. Chest tubes
in place. Stable atelectasis which is mild. Background of chronic
bronchitis. No visible effusion or pneumothorax. Stable
postoperative heart size.
IMPRESSION: Stable hardware positioning and aeration.

## 2020-10-17 MED ORDER — FUROSEMIDE 10 MG/ML IJ SOLN
40.0000 mg | Freq: Once | INTRAMUSCULAR | Status: AC
  Administered 2020-10-17: 40 mg via INTRAVENOUS

## 2020-10-17 MED ORDER — FUROSEMIDE 10 MG/ML IJ SOLN
40.0000 mg | Freq: Two times a day (BID) | INTRAMUSCULAR | Status: DC
  Administered 2020-10-17 – 2020-10-25 (×17): 40 mg via INTRAVENOUS
  Filled 2020-10-17 (×17): qty 4

## 2020-10-17 MED ORDER — ORAL CARE MOUTH RINSE
15.0000 mL | Freq: Two times a day (BID) | OROMUCOSAL | Status: DC
  Administered 2020-10-17 – 2020-10-25 (×17): 15 mL via OROMUCOSAL

## 2020-10-17 MED ORDER — IPRATROPIUM-ALBUTEROL 0.5-2.5 (3) MG/3ML IN SOLN
3.0000 mL | Freq: Four times a day (QID) | RESPIRATORY_TRACT | Status: DC | PRN
  Administered 2020-10-17 – 2020-10-22 (×2): 3 mL via RESPIRATORY_TRACT
  Filled 2020-10-17 (×2): qty 3

## 2020-10-17 MED ORDER — KETOROLAC TROMETHAMINE 15 MG/ML IJ SOLN
7.5000 mg | Freq: Four times a day (QID) | INTRAMUSCULAR | Status: AC
  Administered 2020-10-17 – 2020-10-18 (×5): 7.5 mg via INTRAVENOUS
  Filled 2020-10-17 (×5): qty 1

## 2020-10-17 MED ORDER — DIAZEPAM 2 MG PO TABS
2.0000 mg | ORAL_TABLET | Freq: Three times a day (TID) | ORAL | Status: DC
  Administered 2020-10-17 – 2020-10-18 (×4): 2 mg via ORAL
  Filled 2020-10-17 (×4): qty 1

## 2020-10-17 MED ORDER — GUAIFENESIN 200 MG PO TABS
200.0000 mg | ORAL_TABLET | Freq: Four times a day (QID) | ORAL | Status: DC
  Administered 2020-10-18 – 2020-10-19 (×5): 200 mg via ORAL
  Filled 2020-10-17 (×9): qty 1

## 2020-10-17 NOTE — Treatment Plan (Signed)
10/17/20 23:15  [name] laying in bed, alert and oriented to self, place and situation.  Disoriented to time.  He has productive cough with thick secretion and  Wheezing on exam. +1-2 Edema.  CVP 8.  SVR 877 (MAP 76, CO 6.1).  Oxygen drops down to 89-91% with n/c.  Reports deviated septum and O2 placed mouth which improved sat to 97%.  CXR earlier showed atelectasis.   Will order guaifenesin and flutter valve.  Add albuterol/ipraprotium neb for wheezing. Additional dose of lasix 40 IVP x 1.

## 2020-10-17 NOTE — Procedures (Signed)
Extubation Procedure Note  Patient Details:   Name: Jonathan [name] DOB: Jul 26, 1951 MRN: [id number]   Airway Documentation:    Vent end date: 10/17/20 Vent end time: 1210   Evaluation  O2 sats: stable throughout Complications: No apparent complications Patient did tolerate procedure well. Bilateral Breath Sounds: Diminished/Rhonchi/ Exp wheezes.  Pt extubated to 4L [place] without any complications. Positive cuff leak, no stridor noted just some wheezing and rhonchi as noted up above. RT will continue to monitor.    Yes  Jonathan [name] 10/17/2020, 12:11 PM

## 2020-10-17 NOTE — Plan of Care (Signed)

## 2020-10-18 ENCOUNTER — Inpatient Hospital Stay: Payer: Self-pay

## 2020-10-18 ENCOUNTER — Inpatient Hospital Stay (HOSPITAL_COMMUNITY): Payer: Medicare Other

## 2020-10-18 LAB — GLUCOSE, CAPILLARY
Glucose-Capillary: 94 mg/dL (ref 70–99)
Glucose-Capillary: 75 mg/dL (ref 70–99)
Glucose-Capillary: 88 mg/dL (ref 70–99)
Glucose-Capillary: 94 mg/dL (ref 70–99)
Glucose-Capillary: 80 mg/dL (ref 70–99)
Glucose-Capillary: 95 mg/dL (ref 70–99)

## 2020-10-18 LAB — BASIC METABOLIC PANEL
Anion gap: 10 (ref 5–15)
BUN: 16 mg/dL (ref 8–23)
CO2: 26 mmol/L (ref 22–32)
Calcium: 7.6 mg/dL — ABNORMAL LOW (ref 8.9–10.3)
Chloride: 101 mmol/L (ref 98–111)
Creatinine, Ser: 1.21 mg/dL (ref 0.61–1.24)
GFR, Estimated: >60 mL/min (ref >60)
Glucose, Bld: 95 mg/dL (ref 70–99)
Potassium: 4 mmol/L (ref 3.5–5.1)
Sodium: 137 mmol/L (ref 135–145)

## 2020-10-18 LAB — COOXEMETRY PANEL
Carboxyhemoglobin: 1 % (ref 0.5–1.5)
Carboxyhemoglobin: 1 % (ref 0.5–1.5)
Carboxyhemoglobin: 1.4 % (ref 0.5–1.5)
Methemoglobin: 1 % (ref 0.0–1.5)
Methemoglobin: 0.8 % (ref 0.0–1.5)
Methemoglobin: 1.1 % (ref 0.0–1.5)
O2 Saturation: 99.1 %
O2 Saturation: 56.9 %
O2 Saturation: 78 %
Total hemoglobin: 10.6 g/dL — ABNORMAL LOW (ref 12.0–16.0)
Total hemoglobin: 10.8 g/dL — ABNORMAL LOW (ref 12.0–16.0)
Total hemoglobin: 10.8 g/dL — ABNORMAL LOW (ref 12.0–16.0)

## 2020-10-18 LAB — CBC
HCT: 32 % — ABNORMAL LOW (ref 39.0–52.0)
Hemoglobin: 10.5 g/dL — ABNORMAL LOW (ref 13.0–17.0)
MCH: 33.1 pg (ref 26.0–34.0)
MCHC: 32.8 g/dL (ref 30.0–36.0)
MCV: 100.9 fL — ABNORMAL HIGH (ref 80.0–100.0)
Platelets: UNDETERMINED 10*3/uL (ref 150–400)
RBC: 3.17 MIL/uL — ABNORMAL LOW (ref 4.22–5.81)
RDW: 12.9 % (ref 11.5–15.5)
WBC: 12.7 10*3/uL — ABNORMAL HIGH (ref 4.0–10.5)
nRBC: 0 % (ref 0.0–0.2)

## 2020-10-18 IMAGING — DX DG CHEST 1V PORT
1 series · 1 of 1 positions shown · non-contrast
Comparison: Portable chest [DATE] and earlier.

CLINICAL DATA: 69-year-old male postoperative day 3 status post
CABG.

EXAM:
PORTABLE CHEST 1 VIEW

[chest ap]
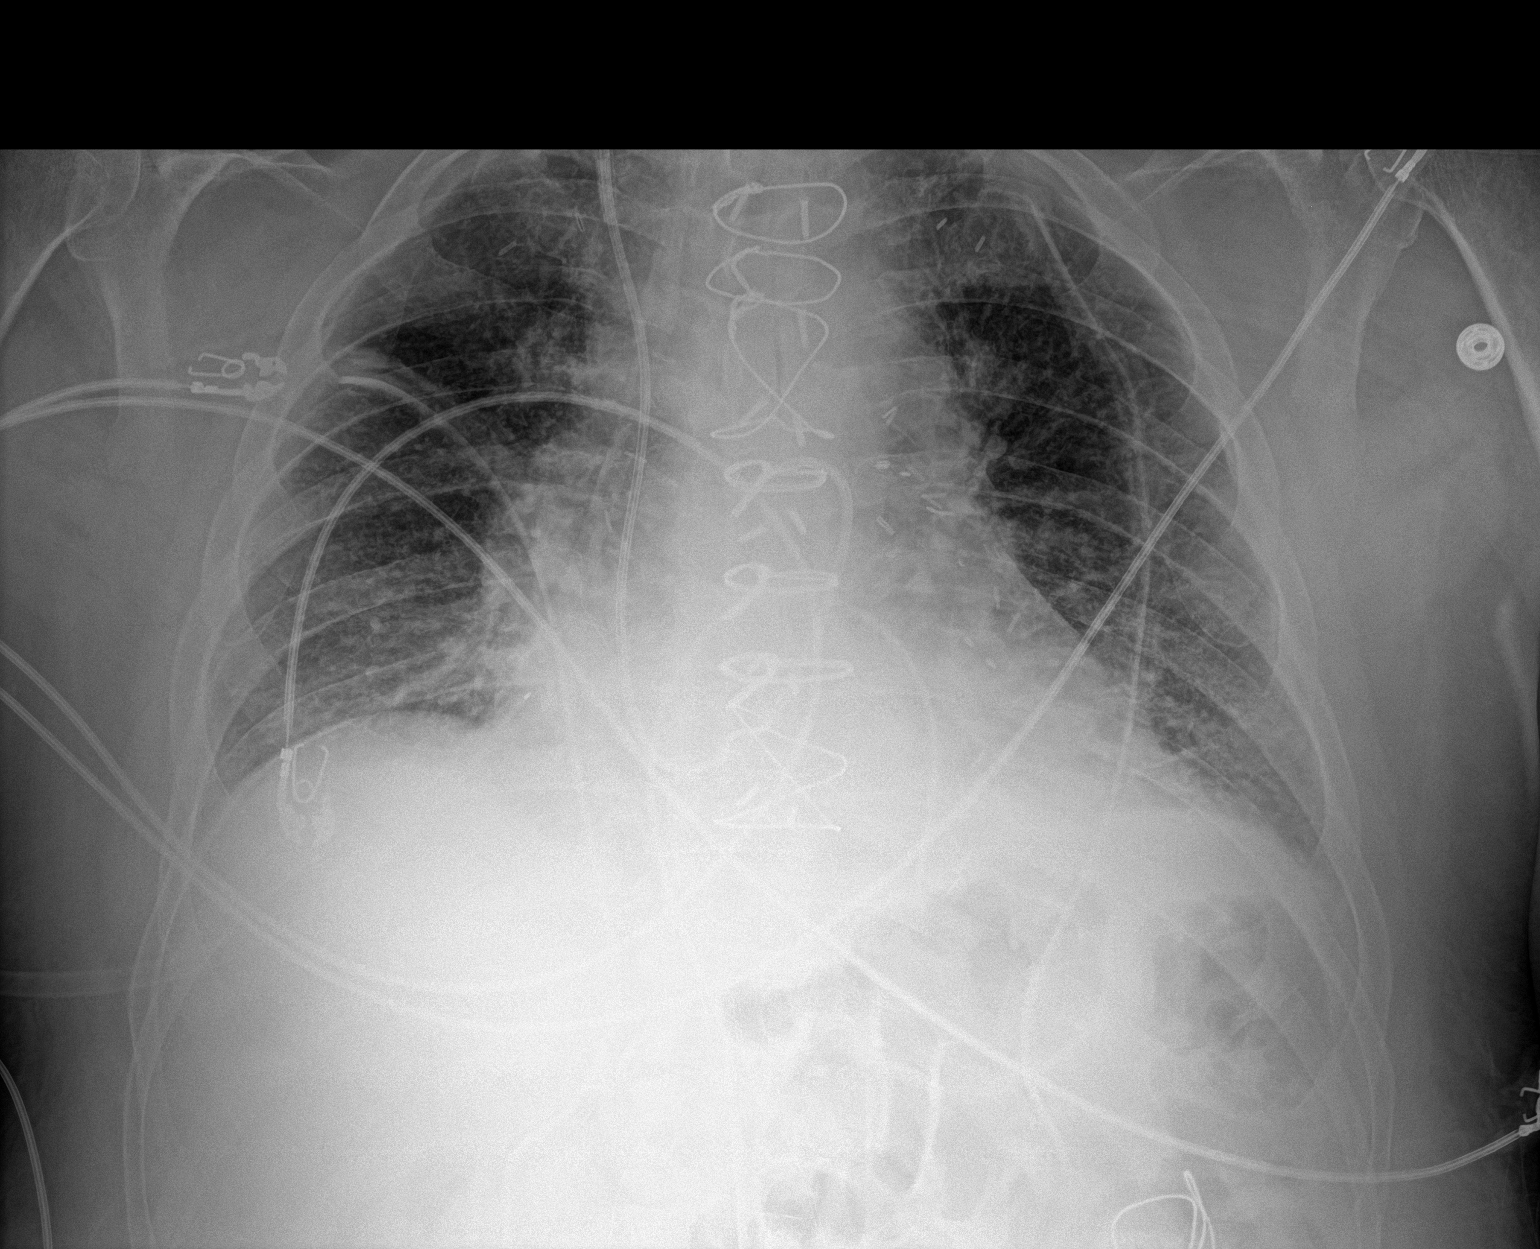

[1 of 1 positions shown; findings below may reference images not displayed]

FINDINGS: Portable AP semi upright view at [TN] hours. Extubated. Enteric tube
removed. Stable chest tubes, mediastinal tube, right IJ approach
Swan-Ganz catheter.

Lower lung volumes. Stable mediastinal contours. No pneumothorax or
pleural effusion. Increased atelectasis. No overt edema. Visible
bowel-gas pattern within normal limits.
IMPRESSION: 1. Extubated and enteric tube removed. Otherwise stable lines and
tubes.
2. Lower lung volumes with increased atelectasis. No pneumothorax or
overt edema.

## 2020-10-18 MED ORDER — TRAMADOL HCL 50 MG PO TABS
50.0000 mg | ORAL_TABLET | ORAL | Status: DC | PRN
  Administered 2020-10-18 – 2020-10-20 (×5): 50 mg via ORAL
  Administered 2020-10-20: 100 mg via ORAL
  Administered 2020-10-20: 50 mg via ORAL
  Administered 2020-10-21: 100 mg via ORAL
  Administered 2020-10-21 – 2020-10-22 (×4): 50 mg via ORAL
  Administered 2020-10-22: 100 mg via ORAL
  Administered 2020-10-22: 50 mg via ORAL
  Administered 2020-10-22: 100 mg via ORAL
  Filled 2020-10-18 (×2): qty 1
  Filled 2020-10-18 (×2): qty 2
  Filled 2020-10-18 (×3): qty 1
  Filled 2020-10-18: qty 2
  Filled 2020-10-18 (×3): qty 1
  Filled 2020-10-18: qty 2
  Filled 2020-10-18 (×4): qty 1

## 2020-10-18 MED ORDER — SODIUM CHLORIDE 0.9% FLUSH
10.0000 mL | INTRAVENOUS | Status: DC | PRN
Start: 2020-10-18 — End: 2020-10-21

## 2020-10-18 MED ORDER — PANTOPRAZOLE SODIUM 40 MG PO TBEC
40.0000 mg | DELAYED_RELEASE_TABLET | Freq: Every day | ORAL | Status: DC
  Administered 2020-10-19: 40 mg via ORAL
  Filled 2020-10-18: qty 1

## 2020-10-18 MED ORDER — MILRINONE LACTATE IN DEXTROSE 20-5 MG/100ML-% IV SOLN
0.1250 ug/kg/min | INTRAVENOUS | Status: DC
Start: 2020-10-18 — End: 2020-10-18

## 2020-10-18 MED ORDER — SODIUM CHLORIDE 0.9% FLUSH
10.0000 mL | Freq: Two times a day (BID) | INTRAVENOUS | Status: DC
  Administered 2020-10-18: 20 mL
  Administered 2020-10-19 – 2020-10-21 (×6): 10 mL

## 2020-10-18 MED ORDER — COLCHICINE 0.3 MG HALF TABLET
0.3000 mg | ORAL_TABLET | Freq: Two times a day (BID) | ORAL | Status: DC
  Filled 2020-10-18: qty 1

## 2020-10-18 MED ORDER — OXYCODONE HCL 5 MG PO TABS
5.0000 mg | ORAL_TABLET | ORAL | Status: DC | PRN
  Administered 2020-10-19 (×4): 10 mg via ORAL
  Administered 2020-10-20: 20:00:00 5 mg via ORAL
  Administered 2020-10-20: 10 mg via ORAL
  Administered 2020-10-20: 14:00:00 5 mg via ORAL
  Administered 2020-10-20 – 2020-10-21 (×2): 10 mg via ORAL
  Administered 2020-10-21: 5 mg via ORAL
  Administered 2020-10-21: 10 mg via ORAL
  Administered 2020-10-22 – 2020-10-23 (×3): 5 mg via ORAL
  Filled 2020-10-18 (×2): qty 2
  Filled 2020-10-18: qty 1
  Filled 2020-10-18: qty 2
  Filled 2020-10-18: qty 1
  Filled 2020-10-18: qty 2
  Filled 2020-10-18: qty 1
  Filled 2020-10-18 (×3): qty 2
  Filled 2020-10-18 (×2): qty 1
  Filled 2020-10-18 (×2): qty 2

## 2020-10-18 MED ORDER — COLCHICINE 0.6 MG PO TABS
0.6000 mg | ORAL_TABLET | Freq: Two times a day (BID) | ORAL | Status: DC
  Administered 2020-10-18 – 2020-10-19 (×2): 0.6 mg via ORAL
  Filled 2020-10-18 (×2): qty 1

## 2020-10-18 NOTE — Progress Notes (Signed)
Peripherally Inserted Central Catheter Placement  The IV Nurse has discussed with the patient and/or persons authorized to consent for the patient, the purpose of this procedure and the potential benefits and risks involved with this procedure.  The benefits include less needle sticks, lab draws from the catheter, and the patient may be discharged home with the catheter. Risks include, but not limited to, infection, bleeding, blood clot (thrombus formation), and puncture of an artery; nerve damage and irregular heartbeat and possibility to perform a PICC exchange if needed/ordered by physician.  Alternatives to this procedure were also discussed.  Bard Power PICC patient education guide, fact sheet on infection prevention and patient information card has been provided to patient /or left at bedside.    PICC Placement Documentation  PICC Double Lumen 10/18/20 PICC Right Brachial 42 cm 0 cm (Active)  Indication for Insertion or Continuance of Line Prolonged intravenous therapies 10/18/20 1501  Exposed Catheter (cm) 0 cm 10/18/20 1501  Site Assessment Clean;Dry;Intact 10/18/20 1501  Lumen #1 Status Flushed;Blood return noted;Saline locked 10/18/20 1501  Lumen #2 Status Flushed;Blood return noted;Saline locked 10/18/20 1501  Dressing Type Transparent 10/18/20 1501  Dressing Status Clean;Intact;Dry 10/18/20 1501  Antimicrobial disc in place? Yes 10/18/20 1501  Dressing Change Due 10/25/20 10/18/20 1501       Scotty Court 10/18/2020, 3:04 PM

## 2020-10-18 NOTE — Progress Notes (Signed)
Patient attempted to dangle on side of bed and stand to weigh, during attempt patient became sob, with labored breathing, and refused to stand. Pt weighed in bed.

## 2020-10-18 NOTE — Plan of Care (Signed)
  Problem: Education: Goal: Knowledge of General Education information will improve Description: Including pain rating scale, medication(s)/side effects and non-pharmacologic comfort measures Outcome: Progressing   Problem: Health Behavior/Discharge Planning: Goal: Ability to manage health-related needs will improve Outcome: Progressing   Problem: Clinical Measurements: Goal: Ability to maintain clinical measurements within normal limits will improve Outcome: Progressing Goal: Will remain free from infection Outcome: Progressing Goal: Diagnostic test results will improve Outcome: Progressing Goal: Respiratory complications will improve Outcome: Progressing Goal: Cardiovascular complication will be avoided Outcome: Progressing   Problem: Activity: Goal: Risk for activity intolerance will decrease Outcome: Progressing   Problem: Nutrition: Goal: Adequate nutrition will be maintained Outcome: Progressing   Problem: Coping: Goal: Level of anxiety will decrease Outcome: Progressing   Problem: Elimination: Goal: Will not experience complications related to bowel motility Outcome: Progressing Goal: Will not experience complications related to urinary retention Outcome: Progressing   Problem: Pain Managment: Goal: General experience of comfort will improve Outcome: Progressing   Problem: Safety: Goal: Ability to remain free from injury will improve Outcome: Progressing   Problem: Skin Integrity: Goal: Risk for impaired skin integrity will decrease Outcome: Progressing   Problem: Education: Goal: Will demonstrate proper wound care and an understanding of methods to prevent future damage Outcome: Progressing Goal: Knowledge of disease or condition will improve Outcome: Progressing Goal: Knowledge of the prescribed therapeutic regimen will improve Outcome: Progressing Goal: Individualized Educational Video(s) Outcome: Progressing   Problem: Activity: Goal: Risk for  activity intolerance will decrease Outcome: Progressing   Problem: Cardiac: Goal: Will achieve and/or maintain hemodynamic stability Outcome: Progressing   Problem: Clinical Measurements: Goal: Postoperative complications will be avoided or minimized Outcome: Progressing   Problem: Respiratory: Goal: Respiratory status will improve Outcome: Progressing   Problem: Skin Integrity: Goal: Wound healing without signs and symptoms of infection Outcome: Progressing Goal: Risk for impaired skin integrity will decrease Outcome: Progressing   Problem: Urinary Elimination: Goal: Ability to achieve and maintain adequate renal perfusion and functioning will improve Outcome: Progressing   Problem: Education: Goal: Knowledge of disease or condition will improve Outcome: Progressing Goal: Knowledge of the prescribed therapeutic regimen will improve Outcome: Progressing Goal: Individualized Educational Video(s) Outcome: Progressing   Problem: Respiratory: Goal: Ability to maintain a clear airway will improve Outcome: Progressing Goal: Levels of oxygenation will improve Outcome: Progressing Goal: Ability to maintain adequate ventilation will improve Outcome: Progressing   

## 2020-10-18 NOTE — Progress Notes (Signed)
3 Days Post-Op Procedure(s) (LRB): CORONARY ARTERY BYPASS GRAFTING (CABG) TIMES FOUR USING BILATERAL INTERNAL MAMMARY ARTERIES AND ENDOSCOPICALLY HARVESTED GREATER SAPHENOUS VEIN AND INSERTION OF 50CC INTRAAORTIC BALLOON PUMP (N/A) TRANSESOPHAGEAL ECHOCARDIOGRAM (TEE) (N/A) INDOCYANINE GREEN FLUORESCENCE IMAGING (ICG) (N/A) ENDOVEIN HARVEST OF GREATER SAPHENOUS VEIN (Right) Subjective: No complaints  Objective: Vital signs in last 24 hours: Temp:  [97.7 F (36.5 C)-98.8 F (37.1 C)] 97.8 F (36.6 C) (02/06 1524) Pulse Rate:  [87-102] 96 (02/06 1800) Cardiac Rhythm: Normal sinus rhythm (02/06 1600) Resp:  [10-26] 13 (02/06 1800) BP: (99-143)/(56-80) 143/67 (02/06 1800) SpO2:  [92 %-100 %] 96 % (02/06 1800) Arterial Line BP: (83-155)/(45-62) 133/57 (02/06 0900) Weight:  [108 kg] 108 kg (02/06 0500)  Hemodynamic parameters for last 24 hours: PAP: (30-48)/(15-25) 33/19 CVP:  [11 mmHg-21 mmHg] 14 mmHg CO:  [5.9 L/min-6.2 L/min] 5.9 L/min CI:  [2.8 L/min/m2-2.9 L/min/m2] 2.8 L/min/m2  Intake/Output from previous day: 02/05 0701 - 02/06 0700 In: 2506 [I.V.:2406.1; IV Piggyback:99.9] Out: 3105 [Urine:2620; Emesis/NG output:200; Chest Tube:285] Intake/Output this shift: Total I/O In: 158.7 [P.O.:50; I.V.:108.7] Out: 1220 [Urine:950; Chest Tube:270]  General appearance: alert and cooperative Neurologic: intact Heart: regular rate and rhythm, S1, S2 normal, no murmur, click, rub or gallop Lungs: clear to auscultation bilaterally Abdomen: soft, non-tender; bowel sounds normal; no masses,  no organomegaly Extremities: extremities normal, atraumatic, no cyanosis or edema Wound: c/d/i  Lab Results: Recent Labs    10/17/20 0328 10/18/20 0527  WBC 18.8* 12.7*  HGB 11.4* 10.5*  HCT 33.8* 32.0*  PLT PLATELET CLUMPS NOTED ON SMEAR, UNABLE TO ESTIMATE PLATELET CLUMPS NOTED ON SMEAR, UNABLE TO ESTIMATE   BMET:  Recent Labs    10/17/20 0328 10/18/20 0527  NA 138 137  K 4.4 4.0   CL 104 101  CO2 24 26  GLUCOSE 154* 95  BUN 14 16  CREATININE 1.24 1.21  CALCIUM 7.7* 7.6*    PT/INR: No results for input(s): LABPROT, INR in the last 72 hours. ABG    Component Value Date/Time   PHART 7.287 (L) 10/16/2020 1702   HCO3 25.3 10/16/2020 1702   TCO2 27 10/16/2020 1702   ACIDBASEDEF 2.0 10/16/2020 1702   O2SAT 56.9 10/18/2020 0806   CBG (last 3)  Recent Labs    10/18/20 0647 10/18/20 1132 10/18/20 1521  GLUCAP 75 88 94    Assessment/Plan: S/P Procedure(s) (LRB): CORONARY ARTERY BYPASS GRAFTING (CABG) TIMES FOUR USING BILATERAL INTERNAL MAMMARY ARTERIES AND ENDOSCOPICALLY HARVESTED GREATER SAPHENOUS VEIN AND INSERTION OF 50CC INTRAAORTIC BALLOON PUMP (N/A) TRANSESOPHAGEAL ECHOCARDIOGRAM (TEE) (N/A) INDOCYANINE GREEN FLUORESCENCE IMAGING (ICG) (N/A) ENDOVEIN HARVEST OF GREATER SAPHENOUS VEIN (Right) Mobilize Diuresis d/c tubes/lines   Making good progress   LOS: 4 days    [name] 10/18/2020

## 2020-10-18 NOTE — Progress Notes (Signed)
Secure chat with Margreta Journey RN re PICC placement.  Plan on placement around 1400 at this time.

## 2020-10-19 ENCOUNTER — Inpatient Hospital Stay (HOSPITAL_COMMUNITY): Payer: Medicare Other

## 2020-10-19 LAB — CBC
HCT: 33.5 % — ABNORMAL LOW (ref 39.0–52.0)
Hemoglobin: 10.8 g/dL — ABNORMAL LOW (ref 13.0–17.0)
MCH: 32.9 pg (ref 26.0–34.0)
MCHC: 32.2 g/dL (ref 30.0–36.0)
MCV: 102.1 fL — ABNORMAL HIGH (ref 80.0–100.0)
Platelets: 119 10*3/uL — ABNORMAL LOW (ref 150–400)
RBC: 3.28 MIL/uL — ABNORMAL LOW (ref 4.22–5.81)
RDW: 13.2 % (ref 11.5–15.5)
WBC: 11.6 10*3/uL — ABNORMAL HIGH (ref 4.0–10.5)
nRBC: 0 % (ref 0.0–0.2)

## 2020-10-19 LAB — BASIC METABOLIC PANEL
Anion gap: 9 (ref 5–15)
BUN: 18 mg/dL (ref 8–23)
CO2: 30 mmol/L (ref 22–32)
Calcium: 7.7 mg/dL — ABNORMAL LOW (ref 8.9–10.3)
Chloride: 100 mmol/L (ref 98–111)
Creatinine, Ser: 1.24 mg/dL (ref 0.61–1.24)
GFR, Estimated: >60 mL/min (ref >60)
Glucose, Bld: 99 mg/dL (ref 70–99)
Potassium: 3.8 mmol/L (ref 3.5–5.1)
Sodium: 139 mmol/L (ref 135–145)

## 2020-10-19 LAB — GLUCOSE, CAPILLARY
Glucose-Capillary: 105 mg/dL — ABNORMAL HIGH (ref 70–99)
Glucose-Capillary: 109 mg/dL — ABNORMAL HIGH (ref 70–99)
Glucose-Capillary: 72 mg/dL (ref 70–99)
Glucose-Capillary: 83 mg/dL (ref 70–99)
Glucose-Capillary: 87 mg/dL (ref 70–99)
Glucose-Capillary: 89 mg/dL (ref 70–99)
Glucose-Capillary: 93 mg/dL (ref 70–99)

## 2020-10-19 LAB — COOXEMETRY PANEL
Carboxyhemoglobin: 1.3 % (ref 0.5–1.5)
Methemoglobin: 1.1 % (ref 0.0–1.5)
O2 Saturation: 76.2 %
Total hemoglobin: 11 g/dL — ABNORMAL LOW (ref 12.0–16.0)

## 2020-10-19 LAB — MAGNESIUM: Magnesium: 2.2 mg/dL (ref 1.7–2.4)

## 2020-10-19 LAB — PHOSPHORUS: Phosphorus: 2.8 mg/dL (ref 2.5–4.6)

## 2020-10-19 IMAGING — DX DG CHEST 1V PORT
1 series · 1 of 1 positions shown · non-contrast
Comparison: Portable chest [DATE] and earlier.

CLINICAL DATA: 69-year-old male postoperative day 4 status post
CABG.

EXAM:
PORTABLE CHEST 1 VIEW

[chest]
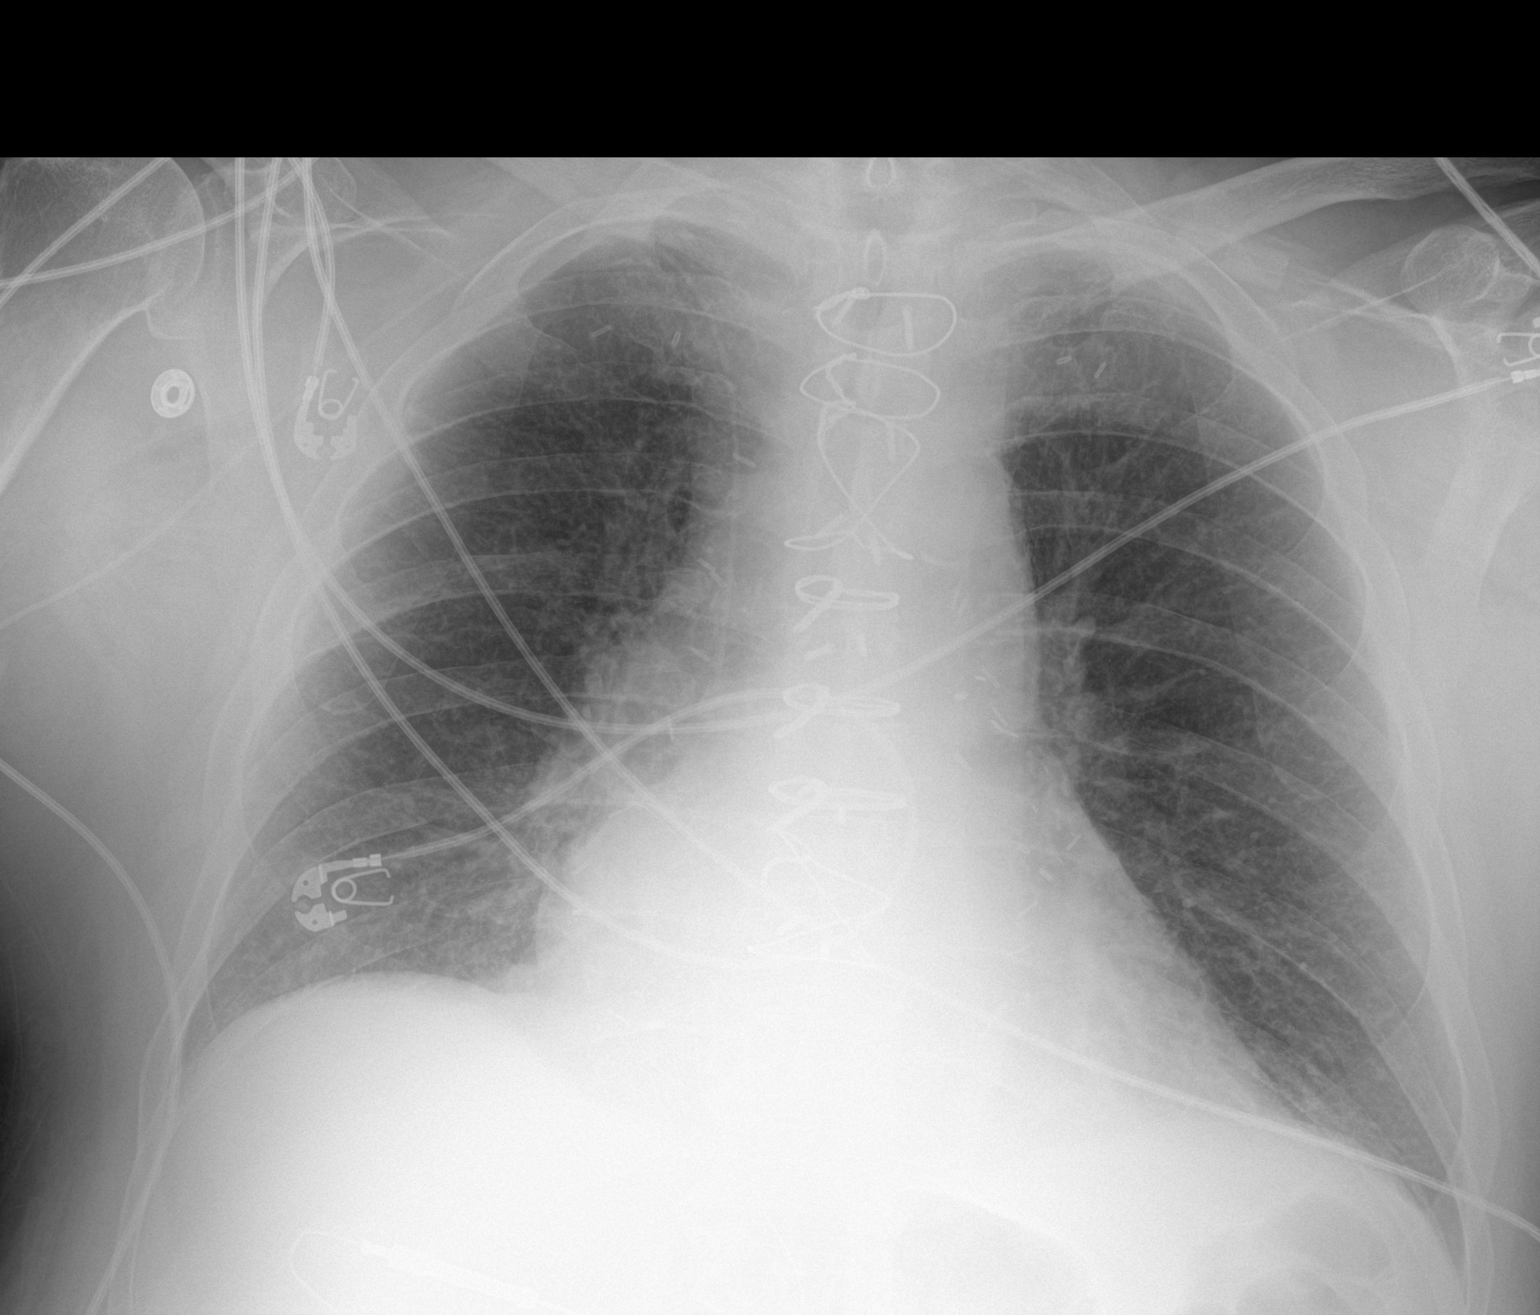

[1 of 1 positions shown; findings below may reference images not displayed]

FINDINGS: Portable AP semi upright view at [TY] hours. Swan-Ganz catheter and
introducer sheath removed. Right PICC line has been placed, tip is
at the lower SVC level. Bilateral chest tubes and mediastinal tube
have been removed.

Larger lung volumes. No pneumothorax identified. Improved bibasilar
ventilation. No pulmonary edema. No pleural effusion is evident.
Stable cardiac size and mediastinal contours.

Negative visible bowel gas pattern. Stable visualized osseous
structures.
IMPRESSION: 1. Right PICC line placed, tip at the lower SVC level.
2. Other lines and tubes removed.
3. Improved lung volumes and ventilation with no pneumothorax or
pulmonary edema.

## 2020-10-19 IMAGING — CT CT HEAD W/O CM
4 series · 16 of 47 positions shown, 18 images · non-contrast
Comparison: None.

CLINICAL DATA: Acute neuro deficit.  Stroke suspected.

EXAM:
CT HEAD WITHOUT CONTRAST
TECHNIQUE: Contiguous axial images were obtained from the base of the skull
through the vertex without intravenous contrast.

[Series 3: head bone · axial · 0.49mm/px · z∈[-104,-68]mm · 3 of 89 slices shown]
[im 9/89  bone]
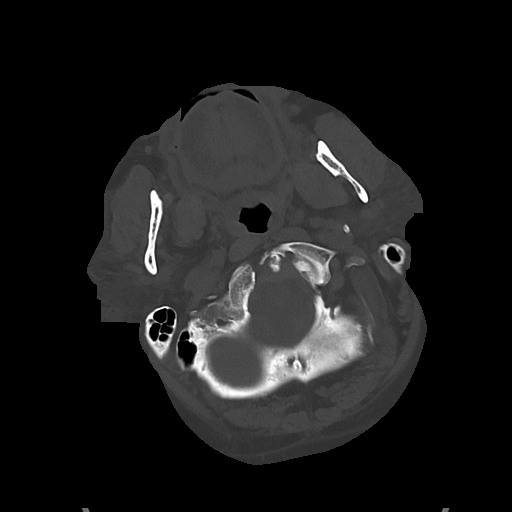
[im 18/89  bone]
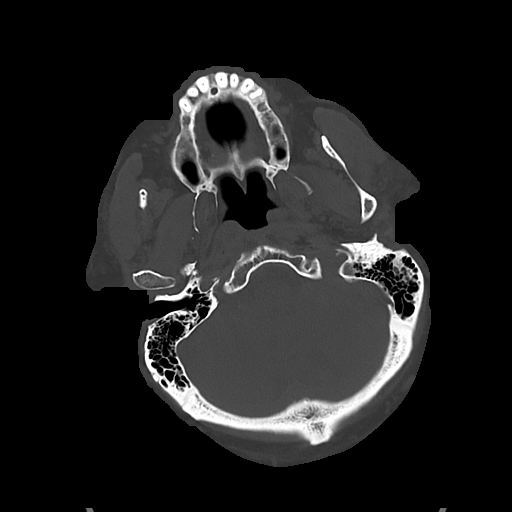
[im 27/89  bone]
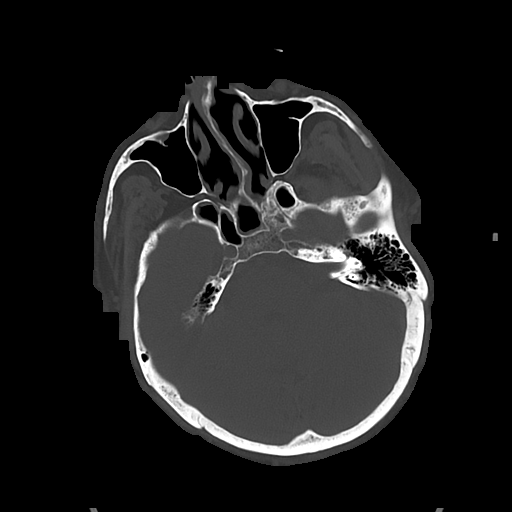

[Series 4: head wo · axial · 0.49mm/px · z∈[-100,+30]mm · 7 of 36 slices shown, 9 images]
[im 5/36  brain]
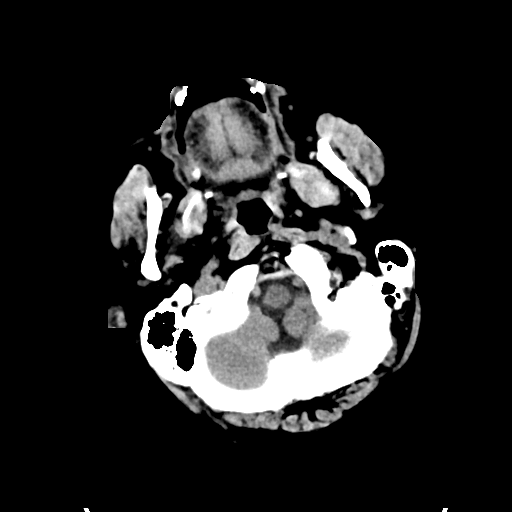
[im 5/36  bone]
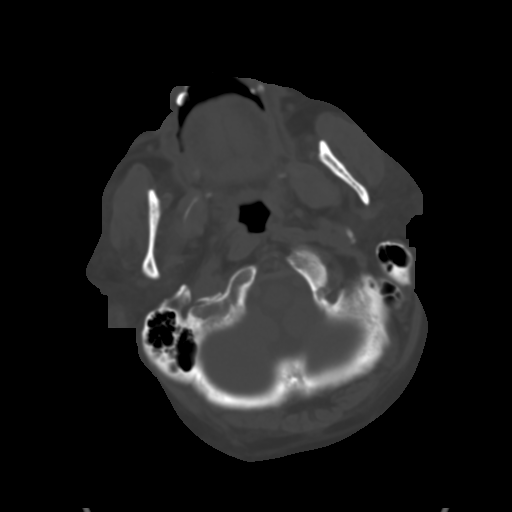
[im 9/36  brain]
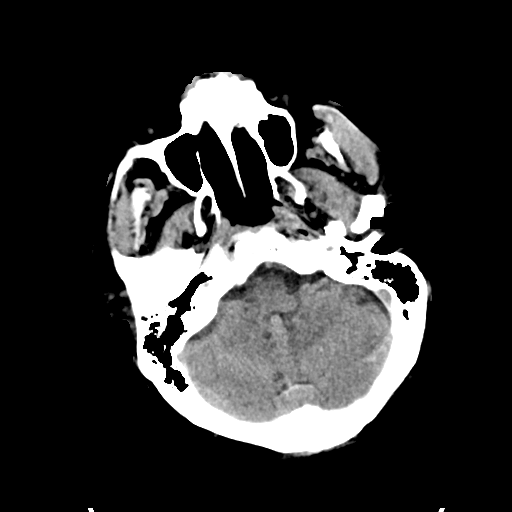
[im 14/36  brain]
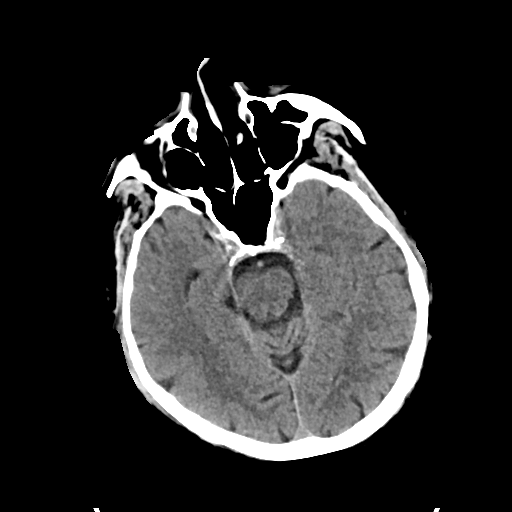
[im 18/36  brain]
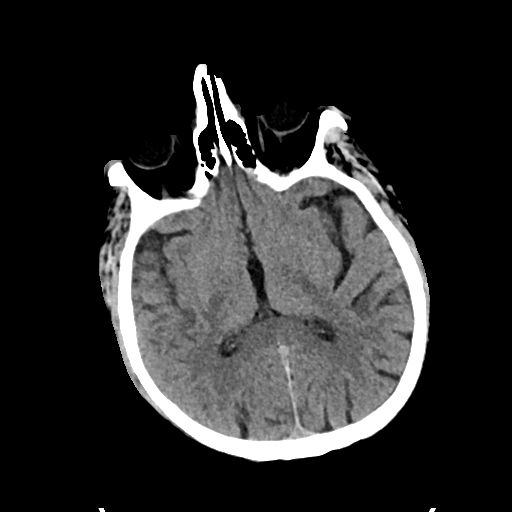
[im 22/36  brain]
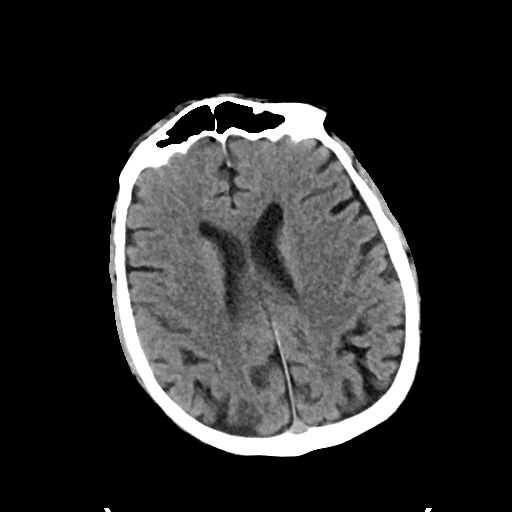
[im 22/36  bone]
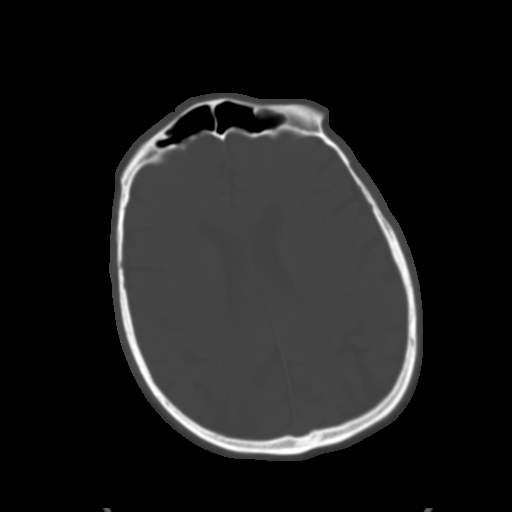
[im 27/36  brain]
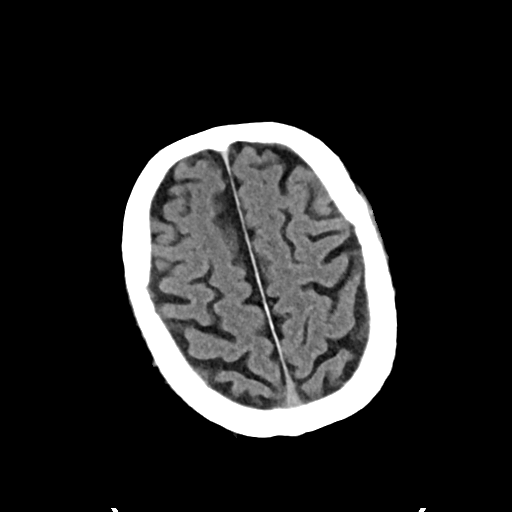
[im 31/36  brain]
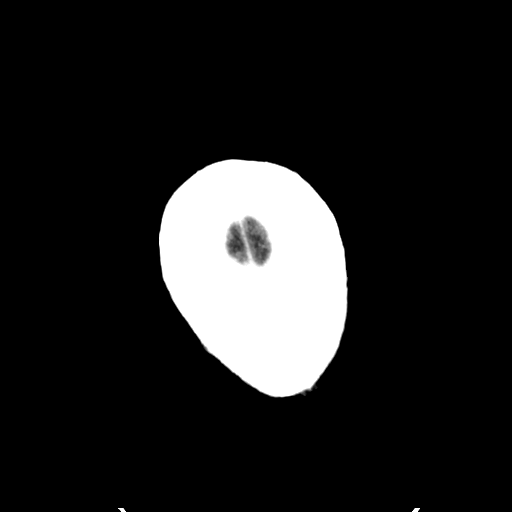

[Series 5: cor soft · coronal · 0.38mm/px · 3 of 86 slices shown]
[im 29/86  brain]
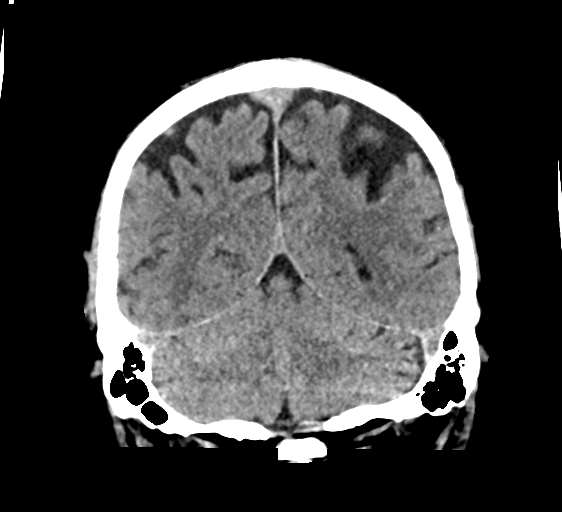
[im 38/86  brain]
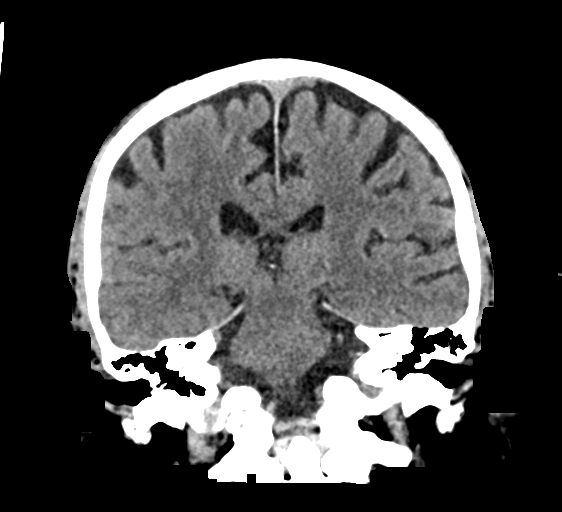
[im 48/86  brain]
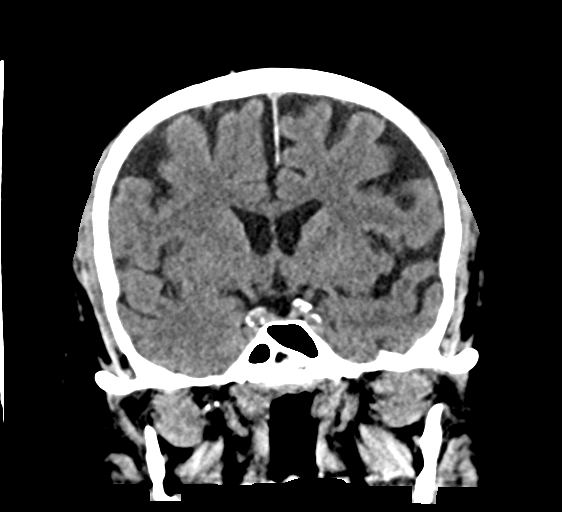

[Series 6: sag soft · sagittal · 0.41mm/px · 3 of 64 slices shown]
[im 22/64  brain]
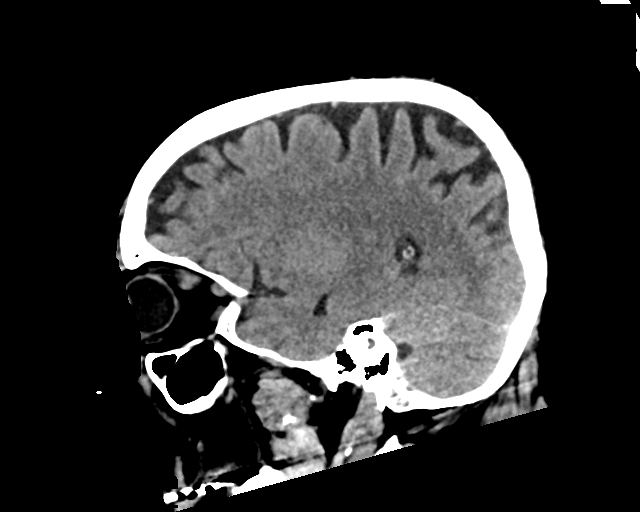
[im 32/64  brain]
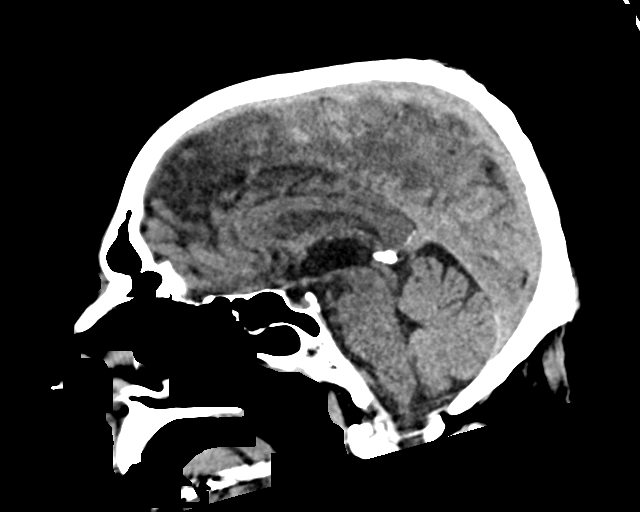
[im 43/64  brain]
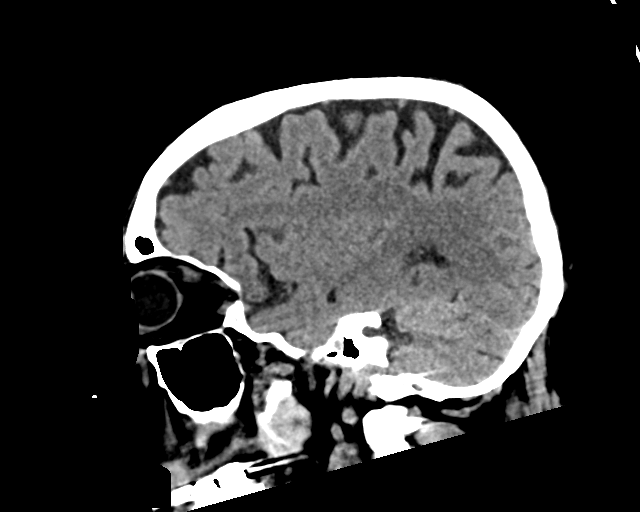

[16 of 47 positions shown; findings below may reference images not displayed]

FINDINGS: Brain: Small age indeterminate infarct noted left posterior
cerebellar hemisphere. No other findings to suggest acute ischemia.
No evidence for mass lesion, midline shift, or hydrocephalus. No
acute hemorrhage or abnormal extra-axial fluid collection. Mild
atrophy.

Vascular: No hyperdense vessel or unexpected calcification.5

Skull: No evidence for fracture. No worrisome lytic or sclerotic
lesion.

Sinuses/Orbits: The visualized paranasal sinuses and mastoid air
cells are clear. Visualized portions of the globes and intraorbital
fat are unremarkable.

Other: None.
IMPRESSION: 1. Small age-indeterminate infarct in the left posterior cerebellar
hemisphere. This could be acute to subacute.
2. Mild atrophy.

## 2020-10-19 MED ORDER — ALTEPLASE 2 MG IJ SOLR
2.0000 mg | Freq: Once | INTRAMUSCULAR | Status: AC
  Administered 2020-10-19: 2 mg
  Filled 2020-10-19: qty 2

## 2020-10-19 MED ORDER — PROSOURCE TF PO LIQD
45.0000 mL | Freq: Four times a day (QID) | ORAL | Status: DC
  Administered 2020-10-19 – 2020-10-25 (×23): 45 mL
  Filled 2020-10-19 (×23): qty 45

## 2020-10-19 MED ORDER — COLCHICINE 0.6 MG PO TABS
0.6000 mg | ORAL_TABLET | Freq: Two times a day (BID) | ORAL | Status: DC
  Administered 2020-10-19 – 2020-10-25 (×12): 0.6 mg
  Filled 2020-10-19 (×12): qty 1

## 2020-10-19 MED ORDER — METOPROLOL TARTRATE 12.5 MG HALF TABLET
12.5000 mg | ORAL_TABLET | Freq: Two times a day (BID) | ORAL | Status: DC
  Administered 2020-10-19 – 2020-10-22 (×6): 12.5 mg
  Filled 2020-10-19 (×6): qty 1

## 2020-10-19 MED ORDER — GUAIFENESIN 200 MG PO TABS
200.0000 mg | ORAL_TABLET | Freq: Four times a day (QID) | ORAL | Status: DC
  Administered 2020-10-19 – 2020-10-25 (×22): 200 mg
  Filled 2020-10-19 (×25): qty 1

## 2020-10-19 MED ORDER — ASPIRIN 81 MG PO CHEW
324.0000 mg | CHEWABLE_TABLET | Freq: Every day | ORAL | Status: DC
  Administered 2020-10-20 – 2020-10-21 (×2): 324 mg
  Filled 2020-10-19 (×2): qty 4

## 2020-10-19 MED ORDER — PANTOPRAZOLE SODIUM 40 MG PO PACK
40.0000 mg | PACK | Freq: Every day | ORAL | Status: DC
  Administered 2020-10-20 – 2020-10-25 (×6): 40 mg
  Filled 2020-10-19 (×6): qty 20

## 2020-10-19 MED ORDER — ACETAMINOPHEN 500 MG PO TABS
1000.0000 mg | ORAL_TABLET | Freq: Four times a day (QID) | ORAL | Status: DC
  Administered 2020-10-19 – 2020-10-20 (×4): 1000 mg
  Filled 2020-10-19 (×3): qty 2

## 2020-10-19 MED ORDER — VITAL 1.5 CAL PO LIQD
1000.0000 mL | ORAL | Status: DC
  Administered 2020-10-19 – 2020-10-24 (×4): 1000 mL
  Filled 2020-10-19 (×4): qty 1000

## 2020-10-19 NOTE — Procedures (Signed)
Cortrak  Person Inserting Tube:  Jacklynn Barnacle E, RD Tube Type:  Cortrak - 43 inches Tube Location:  Right nare Initial Placement:  Stomach Secured by: Bridle Technique Used to Measure Tube Placement:  Documented cm marking at nare/ corner of mouth Cortrak Secured At:  73 cm    Cortrak Tube Team Note:  Consult received to place a Cortrak feeding tube.   No x-ray is required. RN may begin using tube.   If the tube becomes dislodged please keep the tube and contact the Cortrak team at [email or web address] (password [email or web address]) for replacement.  If after hours and replacement cannot be delayed, place a NG tube and confirm placement with an abdominal x-ray.    Jacklynn Barnacle, MS, RD, LDN Pager number available on Amion

## 2020-10-19 NOTE — Progress Notes (Signed)
After patient's bath.  Patient states he does not want to be a DNR but he wants the feeding tube removed.  Explained the tube was supplying nutrition because he is unable to swallow at the moment.  Patient agreed to plan of supplying him nutrition via NGT.

## 2020-10-19 NOTE — Evaluation (Signed)
Clinical/Bedside Swallow Evaluation Patient Details  Name: Jonathan [name] MRN: [id number] Date of Birth: August 09, 1951  Today's Date: 10/19/2020 Time: SLP Start Time (ACUTE ONLY): 2671 SLP Stop Time (ACUTE ONLY): 0952 SLP Time Calculation (min) (ACUTE ONLY): 25 min  Past Medical History:  Past Medical History:  Diagnosis Date  . Arthritis    hands   . Chronic bronchitis ([place])   . Chronic sinusitis of both maxillary sinuses 04/16/2018   Sinus CT 04/25/2018 Mild mucoperiosteal thickening involving the frontoethmoidal sinuses. Otherwise largely clear sinuses with no evidence for acute sinusitis.   [name] COPD GOLD II   active smoker 04/16/2018   Spirometry 04/16/2018  FEV1 1.43 (47%)  Ratio 62  s prior rx  - 04/16/2018    try symbicort 160 2bid - PFT's  06/08/2018  FEV1 2.03 (71 % ) ratio 61  p 5 % improvement from saba p nothing prior to study with DLCO  63 % corrects to 72  % for alv volume    -  06/08/2018  After extensive coaching inhaler device,  effectiveness =    75%  (short ti/ variable flow) > continue symbicort 160 2bid   . Coronary artery disease   . Coronary artery disease involving native coronary artery of native heart with unstable angina pectoris ([place])   . DOE (dyspnea on exertion) 04/16/2018  . Essential hypertension, benign 09/23/2012   Changed acei to arb 04/16/2018 due to pseudowheeze > improved 06/08/2018   . Hernia, inguinal 09/23/2012  . Hyperlipidemia    has not tolerated statins,  would not pursue PSK9 inhibitor as advised by Dr Claiborne Billings  . Hypertension   . Myocardial infarction ([place])   . NSTEMI (non-ST elevated myocardial infarction) ([place]) 09/23/2012  . 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 Tobacco abuse    Past Surgical History:  Past Surgical History:  Procedure Laterality Date  . CARDIAC CATHETERIZATION    . CARDIOVASCULAR STRESS TEST  11/11/2011  . CORONARY ANGIOPLASTY     stents- 01/2004   . CORONARY ARTERY BYPASS GRAFT N/A 10/15/2020   Procedure: CORONARY ARTERY BYPASS GRAFTING  (CABG) TIMES FOUR USING BILATERAL INTERNAL MAMMARY ARTERIES AND ENDOSCOPICALLY HARVESTED GREATER SAPHENOUS VEIN AND INSERTION OF 50CC INTRAAORTIC BALLOON PUMP;  Surgeon: [name], MD;  Location: [place] [facility];  Service: Open Heart Surgery;  Laterality: N/A;  possible BIMA  . ENDOVEIN HARVEST OF GREATER SAPHENOUS VEIN Right 10/15/2020   Procedure: ENDOVEIN HARVEST OF GREATER SAPHENOUS VEIN;  Surgeon: [name], MD;  Location: [place];  Service: Open Heart Surgery;  Laterality: Right;  . heart stents  01/2004  . INGUINAL HERNIA REPAIR Right 12/06/2012   Procedure: HERNIA REPAIR INGUINAL ADULT;  Surgeon: [name], DO;  Location: [facility];  Service: General;  Laterality: Right;  . INSERTION OF MESH Right 12/06/2012   Procedure: INSERTION OF MESH;  Surgeon: [name], DO;  Location: [facility];  Service: General;  Laterality: Right;  . LEFT HEART CATH AND CORONARY ANGIOGRAPHY N/A 07/22/2019   Procedure: LEFT HEART CATH AND CORONARY ANGIOGRAPHY;  Surgeon: [name], MD;  Location: [facility];  Service: Cardiovascular;  Laterality: N/A;  . LEFT HEART CATH AND CORONARY ANGIOGRAPHY N/A 10/14/2020   Procedure: LEFT HEART CATH AND CORONARY ANGIOGRAPHY;  Surgeon: [name], MD;  Location: [facility];  Service: Cardiovascular;  Laterality: N/A;  . TEE WITHOUT CARDIOVERSION N/A 10/15/2020   Procedure: TRANSESOPHAGEAL ECHOCARDIOGRAM (TEE);  Surgeon: [name], MD;  Location: [facility];  Service: Open Heart Surgery;  Laterality: N/A;   HPI:  Pt is a 70 y.o. male presenting with coronary artery disease. Pt recieved coronary artery bypass grafting times four on 10/16/20. 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 Pt intubated from 10/15/20 to 10/17/20. PMH includes: COPD, hyperlipidemia, HTN, DOE, chronic bronchitis, chronic sinusitis, myocardial infarction.    Assessment / Plan / Recommendation Clinical Impression  Pt consumed ice chips, thin liquids, and purees with no overt s/s of aspiration. However, pt complained of pain on the R side of thoat and in chest when swallowing which limited PO trials. Pt was on 5L of oxygen prior to evaluation with transient dips in SpO2 and increased WOB noted during PO trials. Following PO trials, pt desaturated as low as 68% and oxygen was increased to 10L by RN. RN reported two similar episodes when trying to given meds today even when crushed in puree and also noted coughing. Given pt's increased risk factors for aspiration and noted desaturations, recomend NPO at this time and SLP will f/u for potential PO trials.   SLP Visit Diagnosis: Dysphagia, unspecified (R13.10)    Aspiration Risk  Moderate aspiration risk    Diet Recommendation NPO   Medication Administration: Via alternative means    Other  Recommendations Oral Care Recommendations: Oral care QID   Follow up Recommendations Other (comment) (tba)      Frequency and Duration min 2x/week  2 weeks       Prognosis Prognosis for Safe Diet Advancement: Good Barriers to Reach Goals: Time post onset;Severity of deficits      Swallow Study   General HPI: Pt is a 70 y.o. male presenting with coronary artery disease. Pt recieved coronary artery bypass grafting times four on 10/16/20. 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 Pt intubated from 10/15/20 to 10/17/20. PMH includes: COPD, hyperlipidemia, HTN, DOE, chronic bronchitis, chronic sinusitis, myocardial infarction. Type of Study: Bedside Swallow Evaluation Previous Swallow Assessment: None in chart Diet Prior to this Study: Thin liquids Temperature Spikes Noted: No Respiratory Status: Nasal cannula History of Recent Intubation: Yes Length of Intubations (days): 3  days Date extubated: 10/17/20 Behavior/Cognition: Alert;Cooperative Oral Cavity Assessment: Within Functional Limits Oral Care Completed by SLP: No Oral Cavity - Dentition: Missing dentition Vision: Functional for self-feeding Self-Feeding Abilities: Needs assist;Able to feed self Patient Positioning: Upright in bed Baseline Vocal Quality: Hoarse;Wet Volitional Cough: Strong Volitional Swallow: Able to elicit    Oral/Motor/Sensory Function Overall Oral Motor/Sensory Function: Within functional limits   Ice Chips Ice chips: Within functional limits Presentation: Spoon   Thin Liquid Thin Liquid: Impaired Presentation: Spoon;Self Fed;Straw Pharyngeal  Phase Impairments: Multiple swallows    Nectar Thick Nectar Thick Liquid: Not tested   Honey Thick Honey Thick Liquid: Not tested   Puree Puree: Within functional limits Presentation: Spoon   Solid     Solid: Not tested     Jeanine Luz., SLP Student 10/19/2020,12:29 PM

## 2020-10-19 NOTE — Progress Notes (Signed)
[facility] BRIEF SICU PROGRESS NOTE  4 Days Post-Op  S/P Procedure(s) (LRB): CORONARY ARTERY BYPASS GRAFTING (CABG) TIMES FOUR USING BILATERAL INTERNAL MAMMARY ARTERIES AND ENDOSCOPICALLY HARVESTED GREATER SAPHENOUS VEIN AND INSERTION OF 50CC INTRAAORTIC BALLOON PUMP (N/A) TRANSESOPHAGEAL ECHOCARDIOGRAM (TEE) (N/A) INDOCYANINE GREEN FLUORESCENCE IMAGING (ICG) (N/A) ENDOVEIN HARVEST OF GREATER SAPHENOUS VEIN (Right)   Stable day.  Resting comfortably at present CT head w/ small age-indeterminate infarct left posterior cerebellum  Plan: Continue current plan.  Await input from stroke team.  Rexene Alberts, MD 10/19/2020 5:38 PM

## 2020-10-19 NOTE — Progress Notes (Signed)
VAST consulted to assess RA PICC, red lumen. Found red lumen with blood in needleless cap. Cap changed, but remain unable to obtain blood return or flush line. Cath-Flo ordered. Upon return to patient's bedside, new needless cap once again noted with blood in it. Attempted to instill Cath-Flo but only able to instill 0.31mLs. Notified unit RN and advised VAST RN will return to attempt flushing remaining Cath-Flo into catheter. Loree Fee, RN verbalized understanding.

## 2020-10-19 NOTE — Progress Notes (Signed)
4 Days Post-Op Procedure(s) (LRB): CORONARY ARTERY BYPASS GRAFTING (CABG) TIMES FOUR USING BILATERAL INTERNAL MAMMARY ARTERIES AND ENDOSCOPICALLY HARVESTED GREATER SAPHENOUS VEIN AND INSERTION OF 50CC INTRAAORTIC BALLOON PUMP (N/A) TRANSESOPHAGEAL ECHOCARDIOGRAM (TEE) (N/A) INDOCYANINE GREEN FLUORESCENCE IMAGING (ICG) (N/A) ENDOVEIN HARVEST OF GREATER SAPHENOUS VEIN (Right) Subjective: cough  Objective: Vital signs in last 24 hours: Temp:  [97.3 F (36.3 C)-99.2 F (37.3 C)] 97.9 F (36.6 C) (02/07 1113) Pulse Rate:  [85-106] 85 (02/07 1600) Cardiac Rhythm: Normal sinus rhythm (02/07 0800) Resp:  [8-24] 8 (02/07 1600) BP: (105-159)/(62-86) 117/78 (02/07 1600) SpO2:  [84 %-100 %] 99 % (02/07 1600) Weight:  [106.8 kg] 106.8 kg (02/07 0500)  Hemodynamic parameters for last 24 hours:    Intake/Output from previous day: 02/06 0701 - 02/07 0700 In: 208.7 [P.O.:50; I.V.:158.7] Out: 2455 [Urine:2185; Chest Tube:270] Intake/Output this shift: Total I/O In: 32.1 [I.V.:32.1] Out: 1155 [Urine:1095; Emesis/NG output:60]  General appearance: alert and cooperative Neurologic: appears to have word-finding difficulty Heart: regular rate and rhythm, S1, S2 normal, no murmur, click, rub or gallop Lungs: rales bibasilar Abdomen: soft, non-tender; bowel sounds normal; no masses,  no organomegaly Extremities: edema 1+ Wound: dressed, dry  Lab Results: Recent Labs    10/18/20 0527 10/19/20 0256  WBC 12.7* 11.6*  HGB 10.5* 10.8*  HCT 32.0* 33.5*  PLT PLATELET CLUMPS NOTED ON SMEAR, UNABLE TO ESTIMATE 119*   BMET:  Recent Labs    10/18/20 0527 10/19/20 0256  NA 137 139  K 4.0 3.8  CL 101 100  CO2 26 30  GLUCOSE 95 99  BUN 16 18  CREATININE 1.21 1.24  CALCIUM 7.6* 7.7*    PT/INR: No results for input(s): LABPROT, INR in the last 72 hours. ABG    Component Value Date/Time   PHART 7.287 (L) 10/16/2020 1702   HCO3 25.3 10/16/2020 1702   TCO2 27 10/16/2020 1702   ACIDBASEDEF  2.0 10/16/2020 1702   O2SAT 76.2 10/19/2020 0256   CBG (last 3)  Recent Labs    10/19/20 0413 10/19/20 0741 10/19/20 1115  GLUCAP 93 72 89    Assessment/Plan: S/P Procedure(s) (LRB): CORONARY ARTERY BYPASS GRAFTING (CABG) TIMES FOUR USING BILATERAL INTERNAL MAMMARY ARTERIES AND ENDOSCOPICALLY HARVESTED GREATER SAPHENOUS VEIN AND INSERTION OF 50CC INTRAAORTIC BALLOON PUMP (N/A) TRANSESOPHAGEAL ECHOCARDIOGRAM (TEE) (N/A) INDOCYANINE GREEN FLUORESCENCE IMAGING (ICG) (N/A) ENDOVEIN HARVEST OF GREATER SAPHENOUS VEIN (Right) Mobilize Diuresis  Neuro consult NPO due to aspiration on SLP assessment; nasojej feeding tube   LOS: 5 days    [name] 10/19/2020

## 2020-10-19 NOTE — Progress Notes (Addendum)
I went into assess patient.  Patient is A&Ox3 appears anxious. He states his chest and neck pain is better.   Patient keeps stating over and over "DNR, DNR, I want to be a DNR.  Please let me Pass."  Calling elink to address patient's desires.  Informed NP Ligget of patient's wishes.

## 2020-10-19 NOTE — Progress Notes (Signed)
Initial Nutrition Assessment  DOCUMENTATION CODES:   Not applicable  INTERVENTION:   Tube feeding:  -Vital 1.5 @ 20 ml/hr via Cortrak -Increase by 10 ml Q4 hours to goal rate of 60 ml/hr (1440 ml) -ProSource TF 45 ml QID  Provides: 2320 kcals, 130 grams protein, 1100 ml free water.   May transition to nocturnal once diet advanced and patient begins eating.   NUTRITION DIAGNOSIS:   Increased nutrient needs related to post-op healing as evidenced by estimated needs.  GOAL:   Patient will meet greater than or equal to 90% of their needs  MONITOR:   Weight trends,Labs,I & O's,Diet advancement,Skin,TF tolerance  REASON FOR ASSESSMENT:   Rounds    ASSESSMENT:   Patient with PMH significant for COPD, CAD, essential HTN, inguinal hernia, HLD, HTN, and STEMI. Presents this admission with ongoing chest pain (previously declined CABG 11/20).   2/2- LHC showing severe CAD  2/3- s/p CABG x 4  2/5- extubated   Patient discussed during ICU rounds and with RN.   Patient confused upon assessment. Unable to obtain nutrition/weight history at this time. SLP evaluated patient this am. Showed to desat and cough upon POs. Kept NPO. Gastric Cortrak placed at bedside. Titrate tube feeding to goal. Monitor for signs of refeeding.   Weight noted to increase over the last year. Suspect this is fluid related. Utilize 108.5 kg as EDW for now.   UOP: 2185 ml x 24 hrs  Chest tubes: 270 ml x 24 hrs   Medications: dulcolax, 40 mg lasix BID, SS novolog Labs: CBG 72-89  NUTRITION - FOCUSED PHYSICAL EXAM:  Flowsheet Row Most Recent Value  Orbital Region No depletion  Upper Arm Region No depletion  Thoracic and Lumbar Region Unable to assess  Buccal Region No depletion  Temple Region No depletion  Clavicle Bone Region No depletion  Clavicle and Acromion Bone Region No depletion  Scapular Bone Region Unable to assess  Dorsal Hand No depletion  Patellar Region No depletion  Anterior Thigh  Region No depletion  Posterior Calf Region No depletion  Edema (RD Assessment) Severe  Hair Reviewed  Eyes Unable to assess  Mouth Unable to assess  Skin Reviewed  Nails Unable to assess     Diet Order:   Diet Order            Diet clear liquid Room service appropriate? Yes; Fluid consistency: Thin  Diet effective now                 EDUCATION NEEDS:   Not appropriate for education at this time  Skin:  Skin Assessment: Skin Integrity Issues: Skin Integrity Issues:: Incisions Incisions: chest, leg  Last BM:  2/7  Height:   Ht Readings from Last 1 Encounters:  10/14/20 5\' 8"  (1.727 m)    Weight:   Wt Readings from Last 1 Encounters:  10/19/20 106.8 kg    BMI:  Body mass index is 35.8 kg/m.  Estimated Nutritional Needs:   Kcal:  2300-2500 kcal  Protein:  115-130 grams  Fluid:  >/= 2 L/day  Mariana Single RD, LDN Clinical Nutrition Pager listed in [place]

## 2020-10-19 NOTE — Plan of Care (Signed)
  Problem: Education: Goal: Knowledge of General Education information will improve Description: Including pain rating scale, medication(s)/side effects and non-pharmacologic comfort measures Outcome: Progressing   Problem: Health Behavior/Discharge Planning: Goal: Ability to manage health-related needs will improve Outcome: Progressing   Problem: Clinical Measurements: Goal: Ability to maintain clinical measurements within normal limits will improve Outcome: Progressing Goal: Will remain free from infection Outcome: Progressing Goal: Diagnostic test results will improve Outcome: Progressing Goal: Respiratory complications will improve Outcome: Progressing Goal: Cardiovascular complication will be avoided Outcome: Progressing   Problem: Activity: Goal: Risk for activity intolerance will decrease Outcome: Progressing   Problem: Nutrition: Goal: Adequate nutrition will be maintained Outcome: Progressing   Problem: Coping: Goal: Level of anxiety will decrease Outcome: Progressing   Problem: Elimination: Goal: Will not experience complications related to bowel motility Outcome: Progressing Goal: Will not experience complications related to urinary retention Outcome: Progressing   Problem: Pain Managment: Goal: General experience of comfort will improve Outcome: Progressing   Problem: Safety: Goal: Ability to remain free from injury will improve Outcome: Progressing   Problem: Skin Integrity: Goal: Risk for impaired skin integrity will decrease Outcome: Progressing   Problem: Education: Goal: Will demonstrate proper wound care and an understanding of methods to prevent future damage Outcome: Progressing Goal: Knowledge of disease or condition will improve Outcome: Progressing Goal: Knowledge of the prescribed therapeutic regimen will improve Outcome: Progressing Goal: Individualized Educational Video(s) Outcome: Progressing   Problem: Activity: Goal: Risk for  activity intolerance will decrease Outcome: Progressing   Problem: Cardiac: Goal: Will achieve and/or maintain hemodynamic stability Outcome: Progressing   Problem: Clinical Measurements: Goal: Postoperative complications will be avoided or minimized Outcome: Progressing   Problem: Respiratory: Goal: Respiratory status will improve Outcome: Progressing   Problem: Skin Integrity: Goal: Wound healing without signs and symptoms of infection Outcome: Progressing Goal: Risk for impaired skin integrity will decrease Outcome: Progressing   Problem: Urinary Elimination: Goal: Ability to achieve and maintain adequate renal perfusion and functioning will improve Outcome: Progressing   Problem: Education: Goal: Knowledge of disease or condition will improve Outcome: Progressing Goal: Knowledge of the prescribed therapeutic regimen will improve Outcome: Progressing Goal: Individualized Educational Video(s) Outcome: Progressing   Problem: Respiratory: Goal: Ability to maintain a clear airway will improve Outcome: Progressing Goal: Levels of oxygenation will improve Outcome: Progressing Goal: Ability to maintain adequate ventilation will improve Outcome: Progressing

## 2020-10-19 NOTE — Plan of Care (Signed)
  Problem: Education: Goal: Knowledge of General Education information will improve Description: Including pain rating scale, medication(s)/side effects and non-pharmacologic comfort measures Outcome: Progressing   Problem: Health Behavior/Discharge Planning: Goal: Ability to manage health-related needs will improve Outcome: Progressing   Problem: Clinical Measurements: Goal: Ability to maintain clinical measurements within normal limits will improve Outcome: Progressing Goal: Will remain free from infection Outcome: Progressing Goal: Diagnostic test results will improve Outcome: Progressing Goal: Respiratory complications will improve Outcome: Progressing Goal: Cardiovascular complication will be avoided Outcome: Progressing   Problem: Activity: Goal: Risk for activity intolerance will decrease Outcome: Progressing   Problem: Nutrition: Goal: Adequate nutrition will be maintained Outcome: Progressing   Problem: Coping: Goal: Level of anxiety will decrease Outcome: Progressing   Problem: Elimination: Goal: Will not experience complications related to bowel motility Outcome: Progressing Goal: Will not experience complications related to urinary retention Outcome: Progressing   Problem: Pain Managment: Goal: General experience of comfort will improve Outcome: Progressing   Problem: Safety: Goal: Ability to remain free from injury will improve Outcome: Progressing   Problem: Skin Integrity: Goal: Risk for impaired skin integrity will decrease Outcome: Progressing   Problem: Education: Goal: Will demonstrate proper wound care and an understanding of methods to prevent future damage Outcome: Progressing Goal: Knowledge of disease or condition will improve Outcome: Progressing Goal: Knowledge of the prescribed therapeutic regimen will improve Outcome: Progressing Goal: Individualized Educational Video(s) Outcome: Progressing   Problem: Activity: Goal: Risk for  activity intolerance will decrease Outcome: Progressing   Problem: Cardiac: Goal: Will achieve and/or maintain hemodynamic stability Outcome: Progressing   Problem: Clinical Measurements: Goal: Postoperative complications will be avoided or minimized Outcome: Progressing   Problem: Respiratory: Goal: Respiratory status will improve Outcome: Progressing   Problem: Skin Integrity: Goal: Wound healing without signs and symptoms of infection Outcome: Progressing Goal: Risk for impaired skin integrity will decrease Outcome: Progressing   Problem: Urinary Elimination: Goal: Ability to achieve and maintain adequate renal perfusion and functioning will improve Outcome: Progressing   Problem: Education: Goal: Knowledge of disease or condition will improve Outcome: Progressing Goal: Knowledge of the prescribed therapeutic regimen will improve Outcome: Progressing Goal: Individualized Educational Video(s) Outcome: Progressing   Problem: Respiratory: Goal: Ability to maintain a clear airway will improve Outcome: Progressing Goal: Levels of oxygenation will improve Outcome: Progressing Goal: Ability to maintain adequate ventilation will improve Outcome: Progressing   

## 2020-10-19 NOTE — Progress Notes (Addendum)
Patient trying to remove NGT.  Instructed patient it would be very painful.  Patient states "I didn't want this."  "I want to be DNR.  No one is listening to me."   Informed patient that I have informed NP of his desires.  She will assess him tonight.

## 2020-10-19 NOTE — Progress Notes (Signed)
VAST RN returned 3 times to instill remaining Cath-Flo; each visit was able to instill a little more Cath-Flo until the entire dose was instilled. Notified Whitney, RN that VAST RN will return in approximately 2 hours to attempt removing Cath-Flo; she verbalized understanding.

## 2020-10-20 ENCOUNTER — Inpatient Hospital Stay (HOSPITAL_COMMUNITY): Payer: Medicare Other

## 2020-10-20 DIAGNOSIS — I25110 Atherosclerotic heart disease of native coronary artery with unstable angina pectoris: Secondary | ICD-10-CM | POA: Diagnosis not present

## 2020-10-20 DIAGNOSIS — I639 Cerebral infarction, unspecified: Secondary | ICD-10-CM | POA: Diagnosis not present

## 2020-10-20 LAB — CBC WITH DIFFERENTIAL/PLATELET
Abs Immature Granulocytes: 0.04 10*3/uL (ref 0.00–0.07)
Basophils Absolute: 0 10*3/uL (ref 0.0–0.1)
Basophils Relative: 0 %
Eosinophils Absolute: 0.1 10*3/uL (ref 0.0–0.5)
Eosinophils Relative: 1 %
HCT: 34.2 % — ABNORMAL LOW (ref 39.0–52.0)
Hemoglobin: 11.3 g/dL — ABNORMAL LOW (ref 13.0–17.0)
Immature Granulocytes: 0 %
Lymphocytes Relative: 15 %
Lymphs Abs: 1.4 10*3/uL (ref 0.7–4.0)
MCH: 34.1 pg — ABNORMAL HIGH (ref 26.0–34.0)
MCHC: 33 g/dL (ref 30.0–36.0)
MCV: 103.3 fL — ABNORMAL HIGH (ref 80.0–100.0)
Monocytes Absolute: 1.1 10*3/uL — ABNORMAL HIGH (ref 0.1–1.0)
Monocytes Relative: 11 %
Neutro Abs: 7.1 10*3/uL (ref 1.7–7.7)
Neutrophils Relative %: 73 %
Platelets: 135 10*3/uL — ABNORMAL LOW (ref 150–400)
RBC: 3.31 MIL/uL — ABNORMAL LOW (ref 4.22–5.81)
RDW: 13.2 % (ref 11.5–15.5)
WBC: 9.8 10*3/uL (ref 4.0–10.5)
nRBC: 0 % (ref 0.0–0.2)

## 2020-10-20 LAB — GLUCOSE, CAPILLARY
Glucose-Capillary: 98 mg/dL (ref 70–99)
Glucose-Capillary: 103 mg/dL — ABNORMAL HIGH (ref 70–99)
Glucose-Capillary: 102 mg/dL — ABNORMAL HIGH (ref 70–99)
Glucose-Capillary: 108 mg/dL — ABNORMAL HIGH (ref 70–99)
Glucose-Capillary: 117 mg/dL — ABNORMAL HIGH (ref 70–99)
Glucose-Capillary: 122 mg/dL — ABNORMAL HIGH (ref 70–99)

## 2020-10-20 LAB — LIPID PANEL
Cholesterol: 111 mg/dL (ref 0–200)
HDL: 35 mg/dL — ABNORMAL LOW (ref >40)
LDL Cholesterol: 52 mg/dL (ref 0–99)
Total CHOL/HDL Ratio: 3.2 RATIO
Triglycerides: 119 mg/dL (ref <150)
VLDL: 24 mg/dL (ref 0–40)

## 2020-10-20 LAB — COOXEMETRY PANEL
Carboxyhemoglobin: 0.8 % (ref 0.5–1.5)
Carboxyhemoglobin: 1.2 % (ref 0.5–1.5)
Methemoglobin: 0.4 % (ref 0.0–1.5)
Methemoglobin: 0.6 % (ref 0.0–1.5)
O2 Saturation: 40.8 %
O2 Saturation: 62.9 %
Total hemoglobin: 11.6 g/dL — ABNORMAL LOW (ref 12.0–16.0)
Total hemoglobin: 12.3 g/dL (ref 12.0–16.0)

## 2020-10-20 LAB — MAGNESIUM
Magnesium: 2.2 mg/dL (ref 1.7–2.4)
Magnesium: 2.2 mg/dL (ref 1.7–2.4)

## 2020-10-20 LAB — BASIC METABOLIC PANEL
Anion gap: 10 (ref 5–15)
BUN: 25 mg/dL — ABNORMAL HIGH (ref 8–23)
CO2: 33 mmol/L — ABNORMAL HIGH (ref 22–32)
Calcium: 8.5 mg/dL — ABNORMAL LOW (ref 8.9–10.3)
Chloride: 101 mmol/L (ref 98–111)
Creatinine, Ser: 1.24 mg/dL (ref 0.61–1.24)
GFR, Estimated: >60 mL/min (ref >60)
Glucose, Bld: 114 mg/dL — ABNORMAL HIGH (ref 70–99)
Potassium: 4.5 mmol/L (ref 3.5–5.1)
Sodium: 144 mmol/L (ref 135–145)

## 2020-10-20 LAB — PHOSPHORUS
Phosphorus: 2.9 mg/dL (ref 2.5–4.6)
Phosphorus: 2.6 mg/dL (ref 2.5–4.6)

## 2020-10-20 LAB — TRIGLYCERIDES: Triglycerides: 116 mg/dL (ref <150)

## 2020-10-20 IMAGING — CR DG CHEST 2V
2 series · 2 of 2 positions shown · non-contrast
Comparison: [DATE]

CLINICAL DATA: Status post cardiac surgery

EXAM:
CHEST - 2 VIEW

[chest lat]
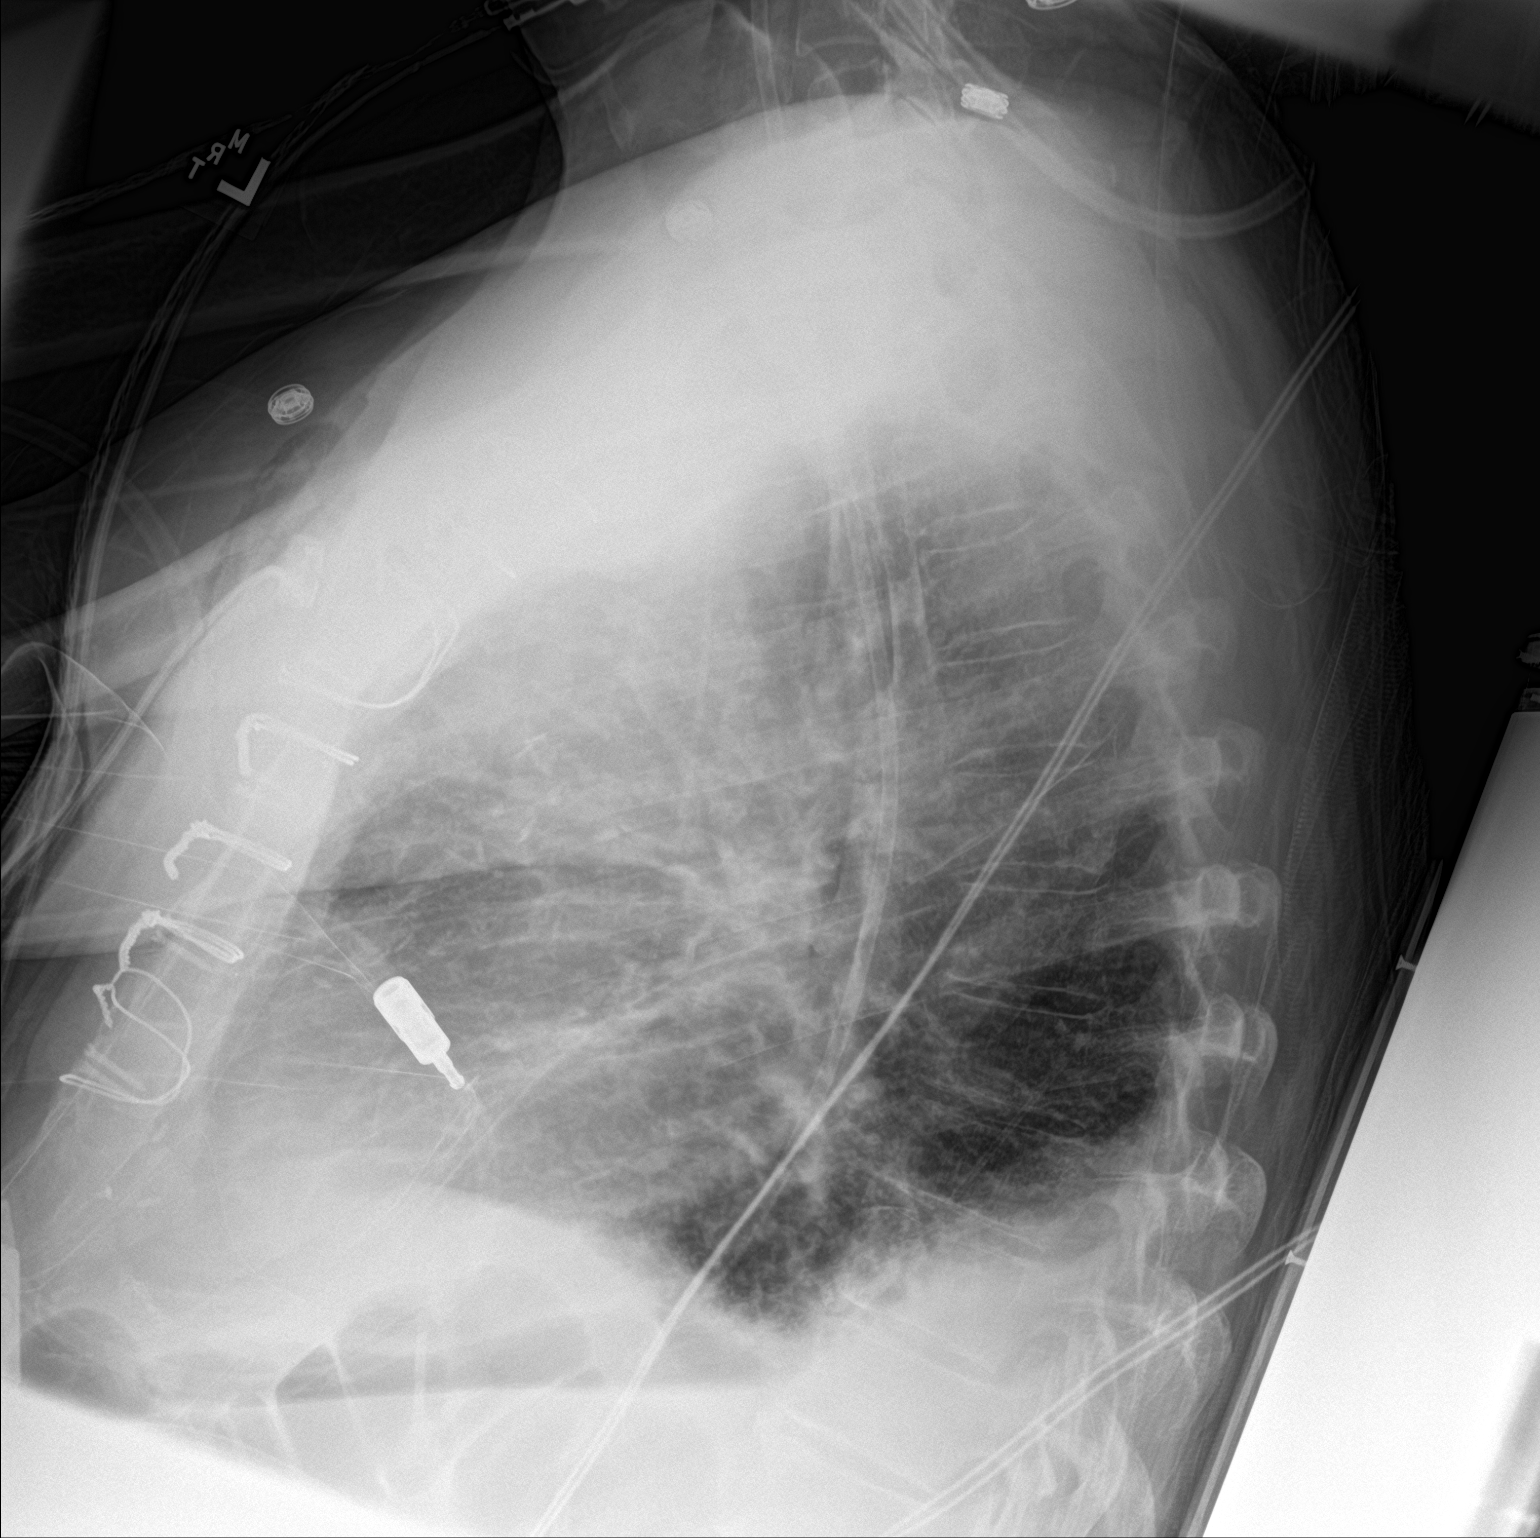

[chest ap]
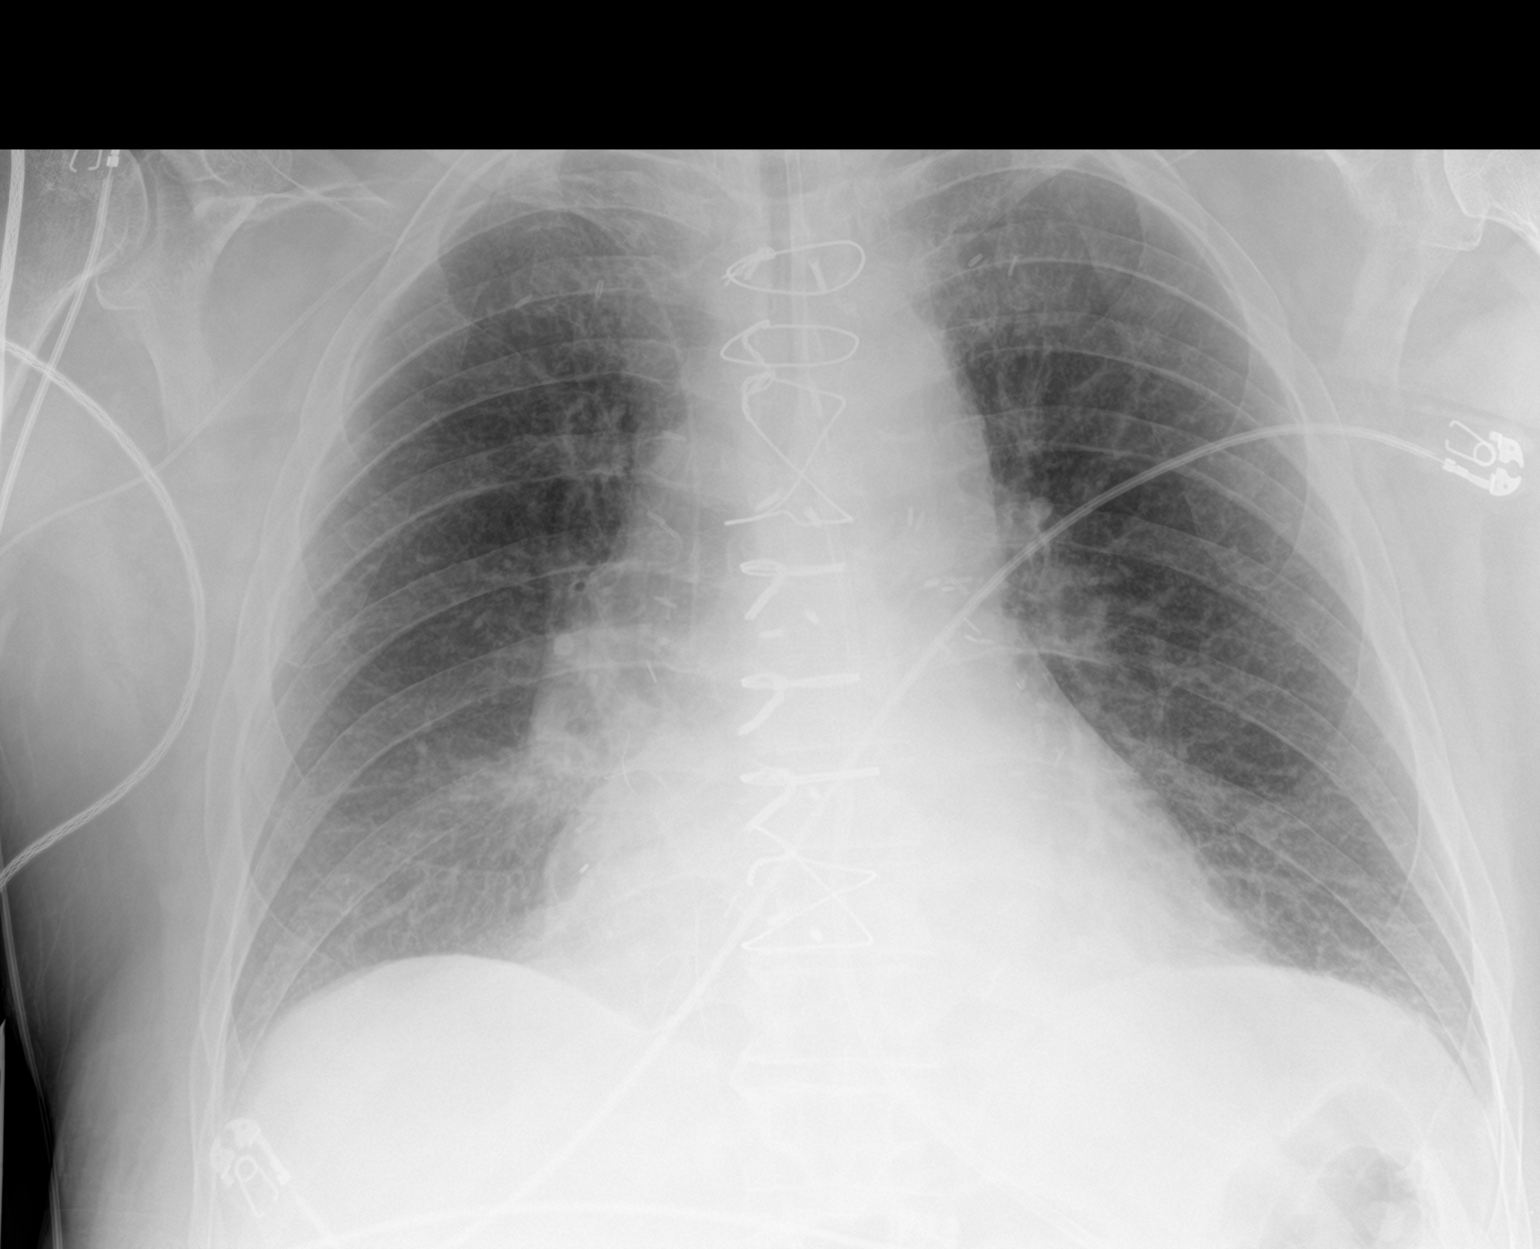

[2 of 2 positions shown; findings below may reference images not displayed]

FINDINGS: Nasoenteric feeding tube extends into the upper abdomen beyond the
margin of the examination. Right upper extremity PICC line tip is
seen within the superior vena cava. The lungs are symmetrically well
expanded and are clear. Small bilateral pleural effusions are
present. No pneumothorax. Cardiac size within normal limits.
Coronary artery bypass grafting has been performed. Pulmonary
vascularity is normal.
IMPRESSION: Small bilateral pleural effusions.  Normal pulmonary vascularity.

## 2020-10-20 IMAGING — MR MR HEAD W/O CM
12 of 13 series · 42 of 48 positions shown · non-contrast
Comparison: Prior CT from [DATE]

CLINICAL DATA: Follow-up examination for acute stroke.



[Series 5: DWI · axial · 3.0mm · 0.88mm/px · z∈[-57,+94]mm · 8 of 104 slices shown (1 of 4)]
[im 1/104]
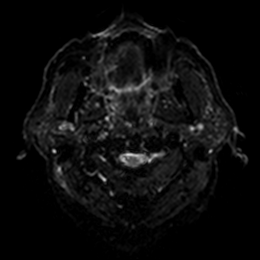
[im 15/104]
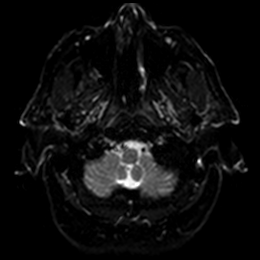
[im 30/104]
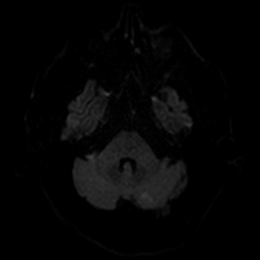
[im 45/104]
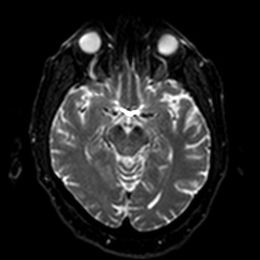
[im 59/104]
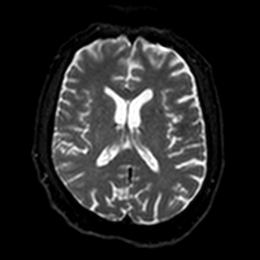
[im 74/104]
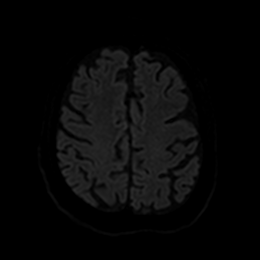
[im 89/104]
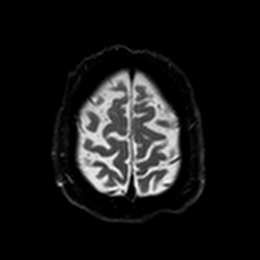
[im 104/104]
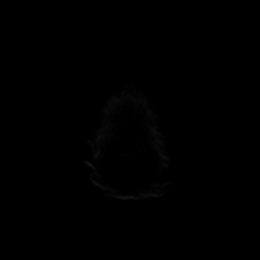

[Series 6: DWI · axial · 3.0mm · 0.88mm/px · z∈[-57,+94]mm · 4 of 52 slices shown (2 of 4)]
[im 1/52]
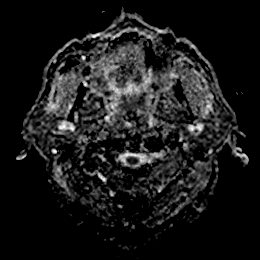
[im 18/52]
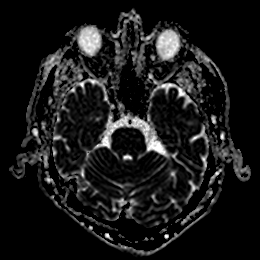
[im 35/52]
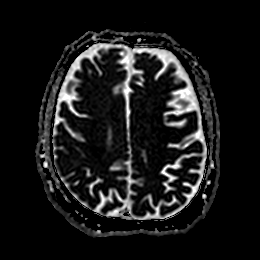
[im 52/52]
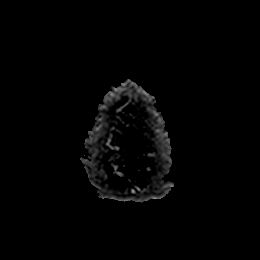

[Series 7: DWI · coronal · 4.0mm · 0.88mm/px · 5 of 72 slices shown (3 of 4)]
[im 1/72]
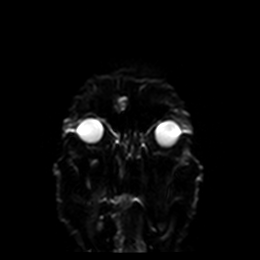
[im 18/72]
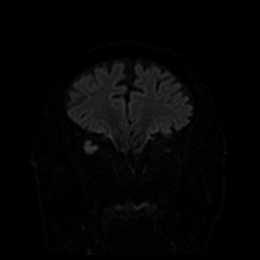
[im 36/72]
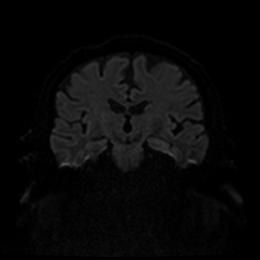
[im 54/72]
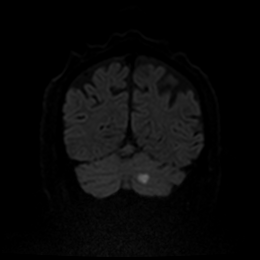
[im 72/72]
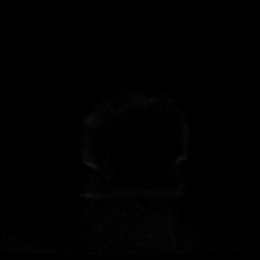

[Series 8: DWI · coronal · 4.0mm · 0.88mm/px · 3 of 36 slices shown (4 of 4)]
[im 1/36]
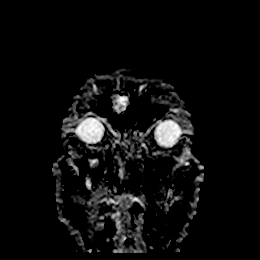
[im 18/36]
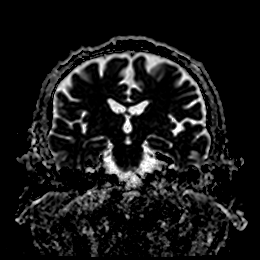
[im 36/36]
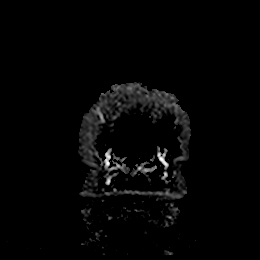

[Series 9: T1 · sagittal · 5.0mm · 0.81mm/px · 2 of 23 slices shown]
[im 1/23]
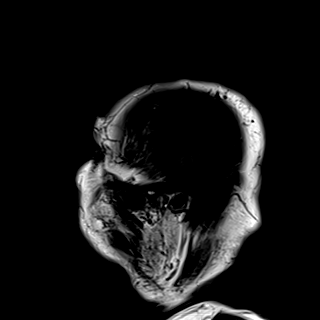
[im 23/23]
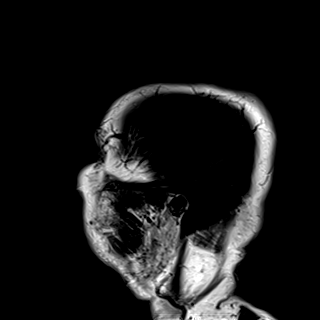

[Series 10: T2 · axial · 5.0mm · 0.72mm/px · z∈[-57,+86]mm · 2 of 25 slices shown (1 of 2)]
[im 1/25]
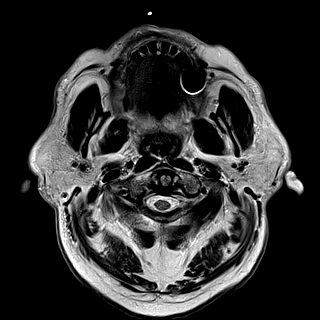
[im 25/25]
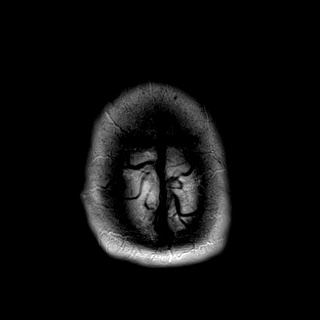

[Series 11: FLAIR · axial · 5.0mm · 0.45mm/px · z∈[-57,+86]mm · 2 of 25 slices shown]
[im 1/25]
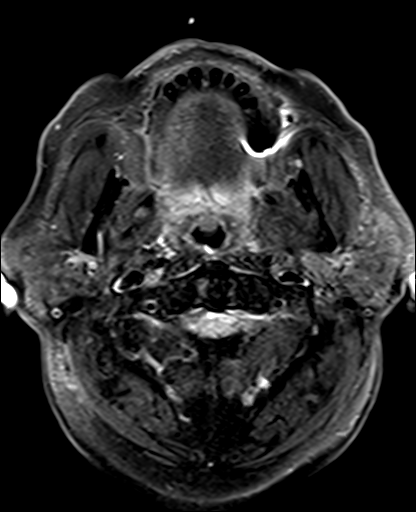
[im 25/25]
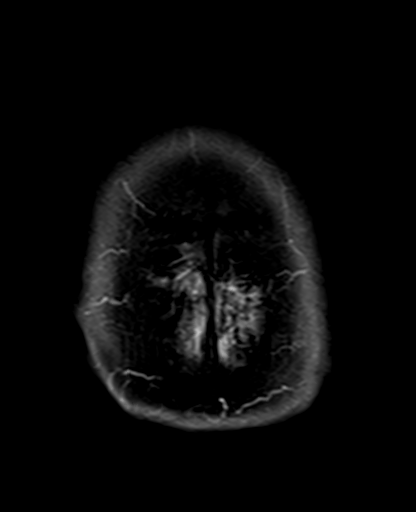

[Series 12: mag_images · axial · 3.0mm · 0.90mm/px · z∈[-63,+113]mm · 4 of 60 slices shown]
[im 1/60]
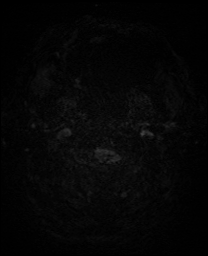
[im 20/60]
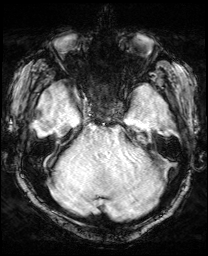
[im 40/60]
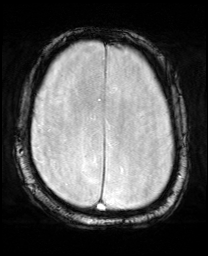
[im 60/60]
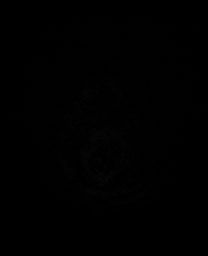

[Series 13: pha_images · axial · 3.0mm · 0.90mm/px · z∈[-63,+113]mm · 4 of 58 slices shown]
[im 1/58]
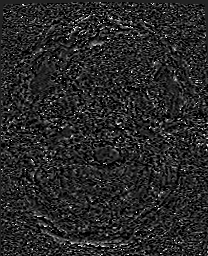
[im 20/58]
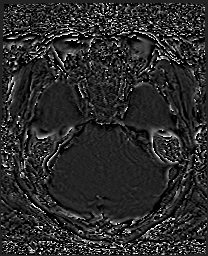
[im 39/58]
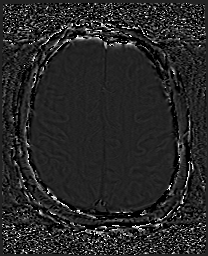
[im 58/58]
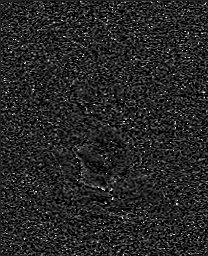

[Series 14: swi_images · axial · 3.0mm · 0.90mm/px · z∈[-63,+113]mm · 4 of 60 slices shown]
[im 1/60]
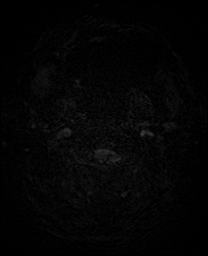
[im 20/60]
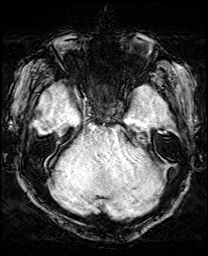
[im 40/60]
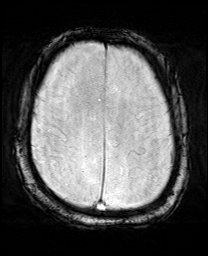
[im 60/60]
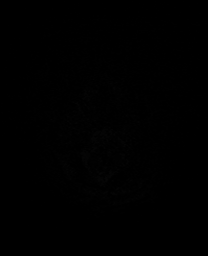

[Series 15: mip_images(sw) · axial · 24.0mm · 0.90mm/px · z∈[-53,-2]mm · 2 of 53 slices shown]
[im 1/53]
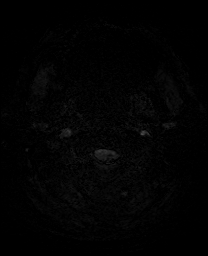
[im 18/53]
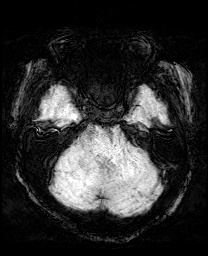

[Series 17: T2 · coronal · 5.0mm · 0.34mm/px · 2 of 29 slices shown (2 of 2)]
[im 1/29]
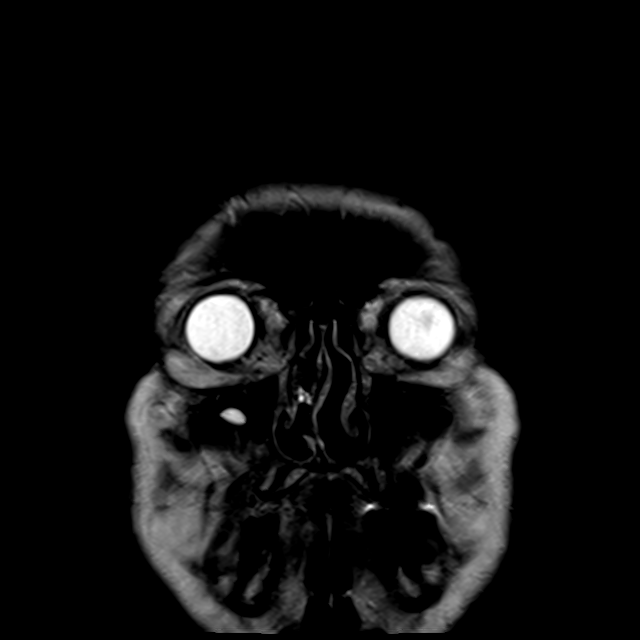
[im 29/29]
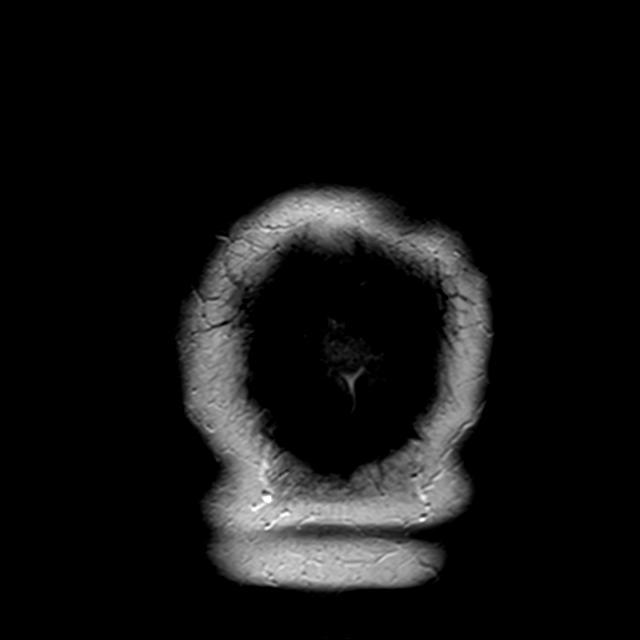

[42 of 48 positions shown; findings below may reference images not displayed]

FINDINGS: MRI HEAD FINDINGS

Brain: Mild diffuse prominence of the CSF containing spaces
compatible with generalized age-related cerebral atrophy. No
significant cerebral white matter disease for age.

Two separate foci of restricted diffusion seen involving the left
cerebellar hemisphere, corresponding with abnormality on prior CT.
Largest area measures up to 2.3 cm. Nearly normalized signal loss on
corresponding ADC map, with associated increased T2/FLAIR signal,
consistent with early subacute ischemic change. No associated
hemorrhage or mass effect. No other diffusion abnormality to suggest
acute or subacute ischemia elsewhere within the brain. Gray-white
matter differentiation otherwise maintained. No encephalomalacia to
suggest chronic cortical infarction elsewhere. No acute intracranial
hemorrhage. Few punctate foci of susceptibility artifact noted
within the brain, nonspecific, but likely small chronic micro
hemorrhages. Findings suspected to be hypertensive in nature.

No mass lesion, midline shift or mass effect. No hydrocephalus or
extra-axial fluid collection. Pituitary gland suprasellar region
normal. Midline structures intact and normal.

Vascular: Major intracranial vascular flow voids are maintained.

Skull and upper cervical spine: Craniocervical junction within
normal limits. Bone marrow signal intensity normal. No scalp soft
tissue abnormality.

Sinuses/Orbits: Globes and orbital soft tissues within normal
limits. Small retention cyst noted within the right maxillary sinus.
Paranasal sinuses are otherwise largely clear. Small left greater
than right mastoid effusions, of doubtful significance. Nasogastric
tube in place. Inner ear structures grossly normal.

Other: None.

MRA HEAD FINDINGS

ANTERIOR CIRCULATION:

Examination degraded by motion artifact.

Visualized distal cervical segments of the internal carotid arteries
are patent with antegrade flow. Petrous segments patent bilaterally.
Probable scattered mild atheromatous irregularity within the carotid
siphons without definite flow-limiting stenosis. A1 segments patent
bilaterally. Normal anterior communicating artery complex. Anterior
cerebral arteries grossly patent to their distal aspects without
appreciable stenosis. No definite M1 stenosis. Left MCA bifurcates
early. There is question of a focal severe proximal right M2
stenosis (series [CX], image 12). This could be artifactual nature
on this degraded exam. Distal MCA branches otherwise perfused and
grossly symmetric.

POSTERIOR CIRCULATION:

Vertebral arteries are largely code dominant and patent to the
vertebrobasilar junction without stenosis. Both PICA origins patent
and normal. Basilar widely patent proximally. Apparent moderate
defect at the distal basilar artery favored to be artifactual in
nature (series [CX], image 10). Superior cerebellar arteries patent
proximally. Both PCAs primarily supplied via the basilar. PCAs
grossly perfused to their distal aspects without definite stenosis.

No visible aneurysm or other vascular abnormality.

MRA NECK FINDINGS

AORTIC ARCH: Examination technically limited by lack of IV contrast
and motion artifact.

Aortic arch and origin of the great vessels not visualized or
assessed on this exam.

RIGHT CAROTID SYSTEM: Visualized right CCA patent without stenosis.
No more than mild atheromatous irregularity or stenosis about the
right bifurcation. Right ICA otherwise patent without stenosis or
evidence for dissection.

LEFT CAROTID SYSTEM: Visualized left CCA patent without stenosis. No
more than mild 20-30% stenosis about the left bifurcation/proximal
left ICA. Left ICA patent distally without stenosis or evidence for
dissection.

VERTEBRAL ARTERIES: Both vertebral arteries appear to arise from the
subclavian arteries. Proximal aspects of the vertebral arteries are
not well assessed on this limited exam. Visualized portions of the
vertebral arteries patent without stenosis or evidence for
dissection.
IMPRESSION: MRI HEAD IMPRESSION:

1. Patchy small volume early subacute ischemic infarcts involving
the left cerebellar hemisphere, corresponding with abnormality on
prior CT. No associated hemorrhage or mass effect.
2. Otherwise normal brain MRI for age.

MRA HEAD IMPRESSION:

1. Technically limited exam due to motion artifact.
2. Negative intracranial MRA for large vessel occlusion.
3. Question focal severe proximal right M2 stenosis versus artifact.
No definite proximal high-grade or correctable stenosis identified.

MRA NECK IMPRESSION:

1. Technically limited exam due to motion artifact.
2. Mild atheromatous irregularity about the carotid
bifurcations/proximal ICAs with no more than mild up to 20-30%
stenosis on the left.
3. Wide patency of both vertebral arteries within the neck.

## 2020-10-20 IMAGING — MR MR MRA NECK W/O CM
1 series · 33 of 48 positions shown · non-contrast
Comparison: Prior CT from [DATE]

CLINICAL DATA: Follow-up examination for acute stroke.



[Series 10: tof_fl3d_tra_iso · axial · 0.6mm · 0.52mm/px · z∈[-246,-80]mm · 33 of 292 slices shown]
[im 1/292]
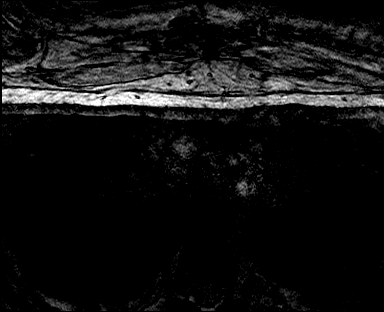
[im 7/292]
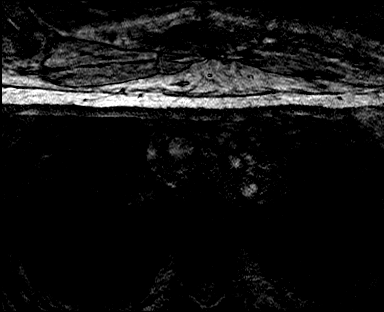
[im 13/292]
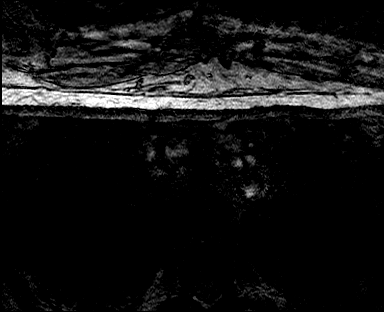
[im 19/292]
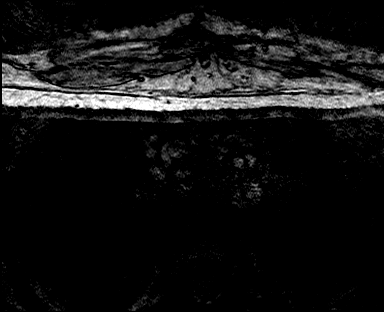
[im 25/292]
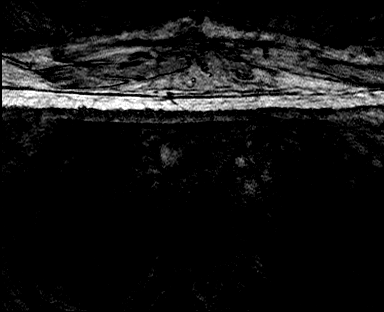
[im 31/292]
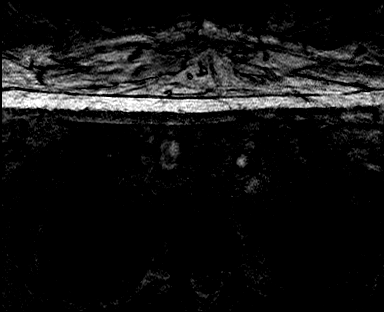
[im 38/292]
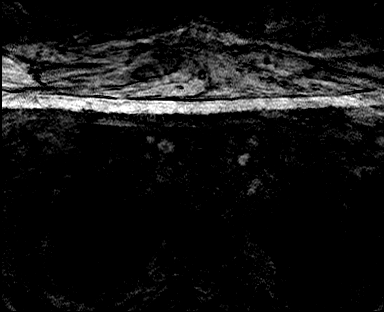
[im 44/292]
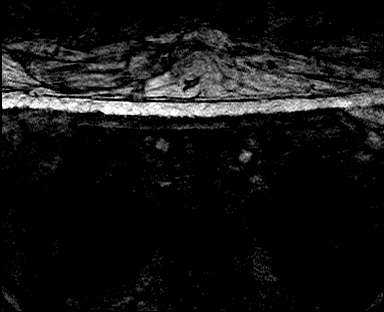
[im 50/292]
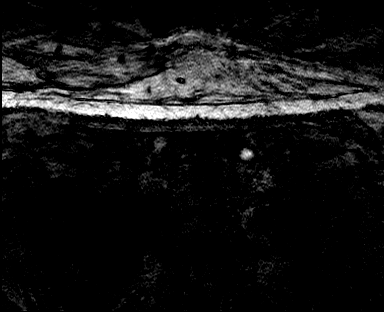
[im 56/292]
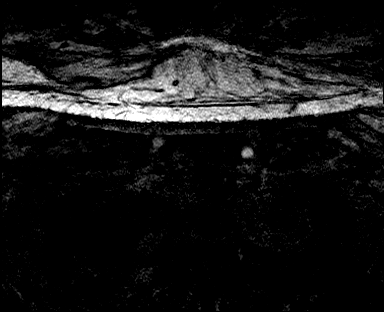
[im 62/292]
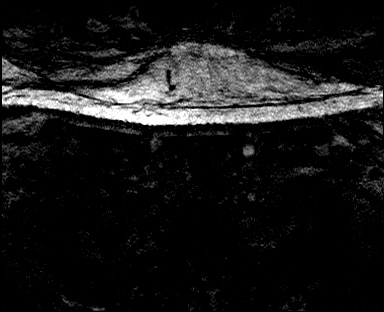
[im 69/292]
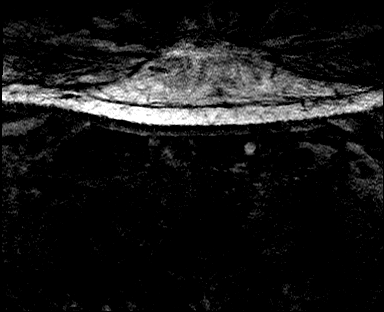
[im 75/292]
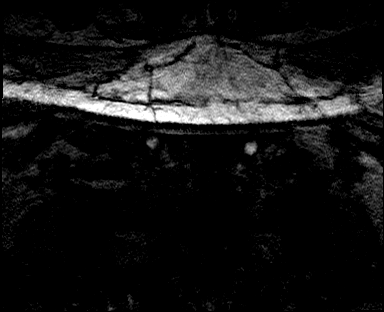
[im 81/292]
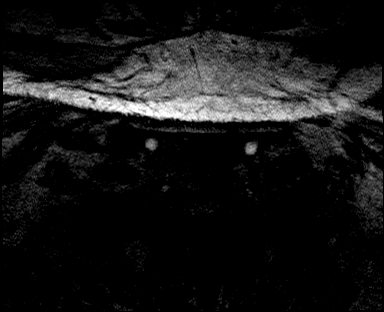
[im 87/292]
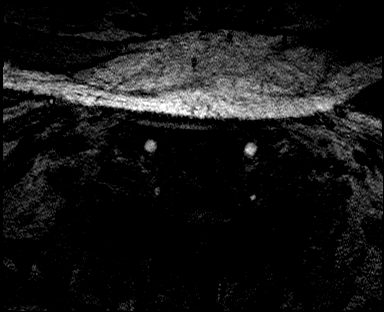
[im 93/292]
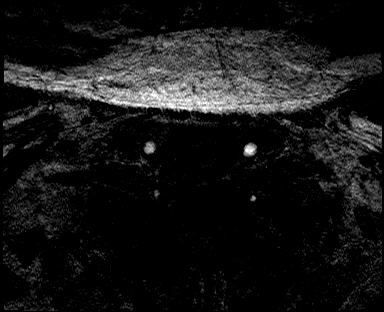
[im 100/292]
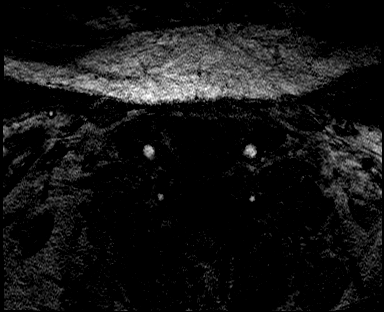
[im 106/292]
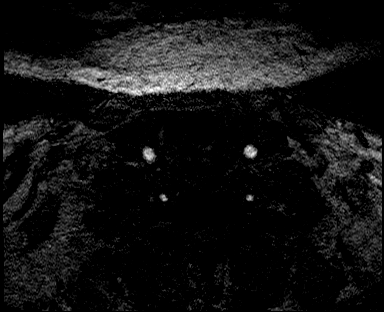
[im 112/292]
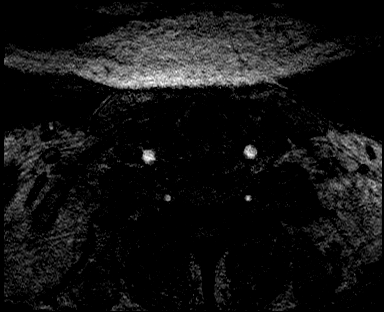
[im 118/292]
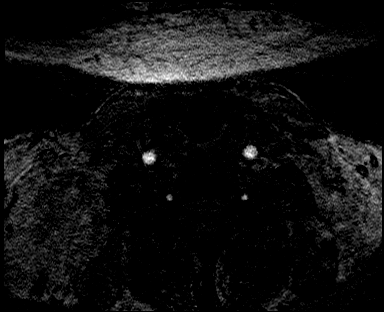
[im 124/292]
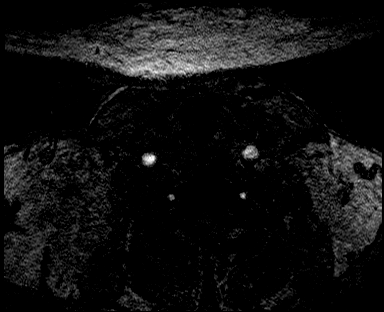
[im 131/292]
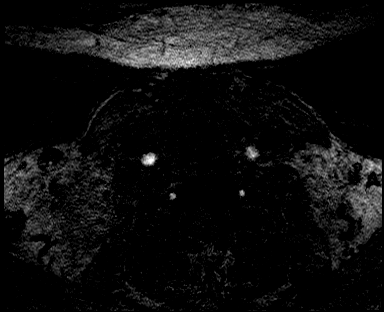
[im 137/292]
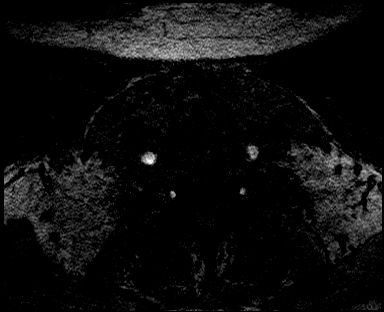
[im 143/292]
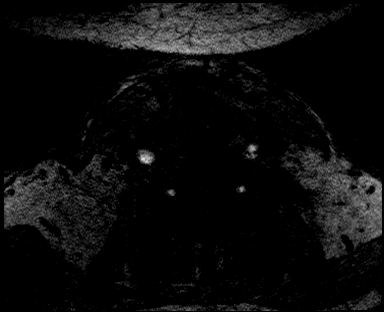
[im 149/292]
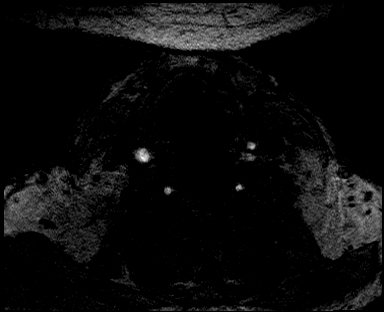
[im 155/292]
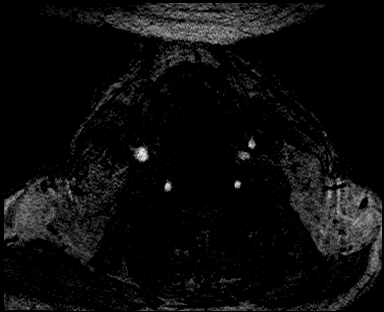
[im 161/292]
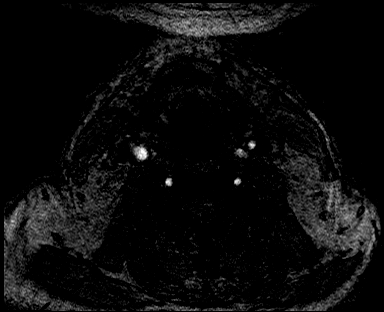
[im 168/292]
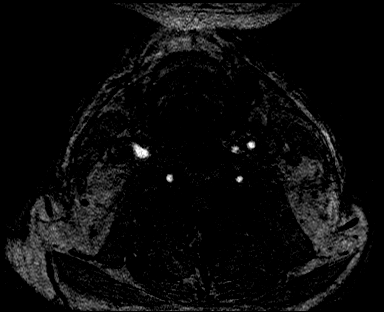
[im 174/292]
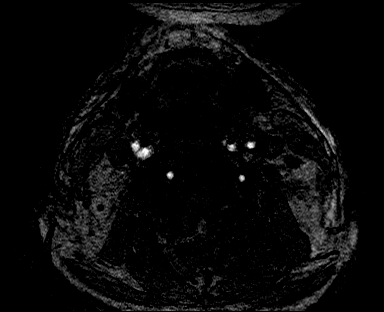
[im 205/292]
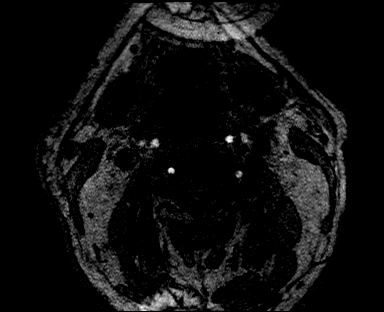
[im 242/292]
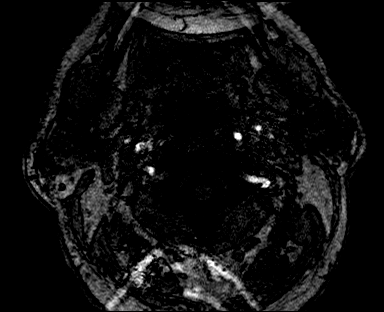
[im 248/292]
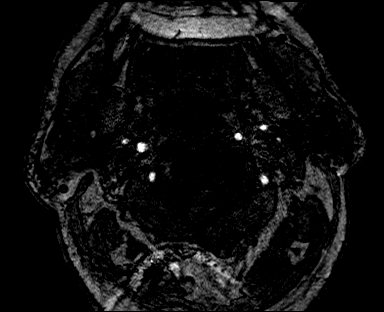
[im 279/292]
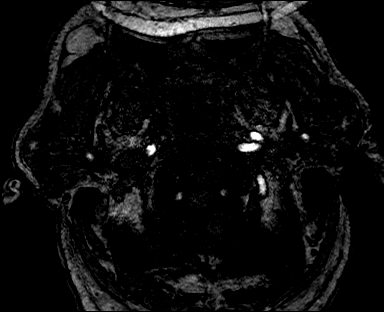

[33 of 48 positions shown; findings below may reference images not displayed]

FINDINGS: MRI HEAD FINDINGS

Brain: Mild diffuse prominence of the CSF containing spaces
compatible with generalized age-related cerebral atrophy. No
significant cerebral white matter disease for age.

Two separate foci of restricted diffusion seen involving the left
cerebellar hemisphere, corresponding with abnormality on prior CT.
Largest area measures up to 2.3 cm. Nearly normalized signal loss on
corresponding ADC map, with associated increased T2/FLAIR signal,
consistent with early subacute ischemic change. No associated
hemorrhage or mass effect. No other diffusion abnormality to suggest
acute or subacute ischemia elsewhere within the brain. Gray-white
matter differentiation otherwise maintained. No encephalomalacia to
suggest chronic cortical infarction elsewhere. No acute intracranial
hemorrhage. Few punctate foci of susceptibility artifact noted
within the brain, nonspecific, but likely small chronic micro
hemorrhages. Findings suspected to be hypertensive in nature.

No mass lesion, midline shift or mass effect. No hydrocephalus or
extra-axial fluid collection. Pituitary gland suprasellar region
normal. Midline structures intact and normal.

Vascular: Major intracranial vascular flow voids are maintained.

Skull and upper cervical spine: Craniocervical junction within
normal limits. Bone marrow signal intensity normal. No scalp soft
tissue abnormality.

Sinuses/Orbits: Globes and orbital soft tissues within normal
limits. Small retention cyst noted within the right maxillary sinus.
Paranasal sinuses are otherwise largely clear. Small left greater
than right mastoid effusions, of doubtful significance. Nasogastric
tube in place. Inner ear structures grossly normal.

Other: None.

MRA HEAD FINDINGS

ANTERIOR CIRCULATION:

Examination degraded by motion artifact.

Visualized distal cervical segments of the internal carotid arteries
are patent with antegrade flow. Petrous segments patent bilaterally.
Probable scattered mild atheromatous irregularity within the carotid
siphons without definite flow-limiting stenosis. A1 segments patent
bilaterally. Normal anterior communicating artery complex. Anterior
cerebral arteries grossly patent to their distal aspects without
appreciable stenosis. No definite M1 stenosis. Left MCA bifurcates
early. There is question of a focal severe proximal right M2
stenosis (series [CX], image 12). This could be artifactual nature
on this degraded exam. Distal MCA branches otherwise perfused and
grossly symmetric.

POSTERIOR CIRCULATION:

Vertebral arteries are largely code dominant and patent to the
vertebrobasilar junction without stenosis. Both PICA origins patent
and normal. Basilar widely patent proximally. Apparent moderate
defect at the distal basilar artery favored to be artifactual in
nature (series [CX], image 10). Superior cerebellar arteries patent
proximally. Both PCAs primarily supplied via the basilar. PCAs
grossly perfused to their distal aspects without definite stenosis.

No visible aneurysm or other vascular abnormality.

MRA NECK FINDINGS

AORTIC ARCH: Examination technically limited by lack of IV contrast
and motion artifact.

Aortic arch and origin of the great vessels not visualized or
assessed on this exam.

RIGHT CAROTID SYSTEM: Visualized right CCA patent without stenosis.
No more than mild atheromatous irregularity or stenosis about the
right bifurcation. Right ICA otherwise patent without stenosis or
evidence for dissection.

LEFT CAROTID SYSTEM: Visualized left CCA patent without stenosis. No
more than mild 20-30% stenosis about the left bifurcation/proximal
left ICA. Left ICA patent distally without stenosis or evidence for
dissection.

VERTEBRAL ARTERIES: Both vertebral arteries appear to arise from the
subclavian arteries. Proximal aspects of the vertebral arteries are
not well assessed on this limited exam. Visualized portions of the
vertebral arteries patent without stenosis or evidence for
dissection.
IMPRESSION: MRI HEAD IMPRESSION:

1. Patchy small volume early subacute ischemic infarcts involving
the left cerebellar hemisphere, corresponding with abnormality on
prior CT. No associated hemorrhage or mass effect.
2. Otherwise normal brain MRI for age.

MRA HEAD IMPRESSION:

1. Technically limited exam due to motion artifact.
2. Negative intracranial MRA for large vessel occlusion.
3. Question focal severe proximal right M2 stenosis versus artifact.
No definite proximal high-grade or correctable stenosis identified.

MRA NECK IMPRESSION:

1. Technically limited exam due to motion artifact.
2. Mild atheromatous irregularity about the carotid
bifurcations/proximal ICAs with no more than mild up to 20-30%
stenosis on the left.
3. Wide patency of both vertebral arteries within the neck.

## 2020-10-20 IMAGING — MR MR MRA HEAD W/O CM
1 series · 16 of 48 positions shown · non-contrast
Comparison: Prior CT from [DATE]

CLINICAL DATA: Follow-up examination for acute stroke.



[Series 5: 3d cow · axial · 0.8mm · 0.45mm/px · z∈[-66,+26]mm · 16 of 130 slices shown]
[im 1/130]
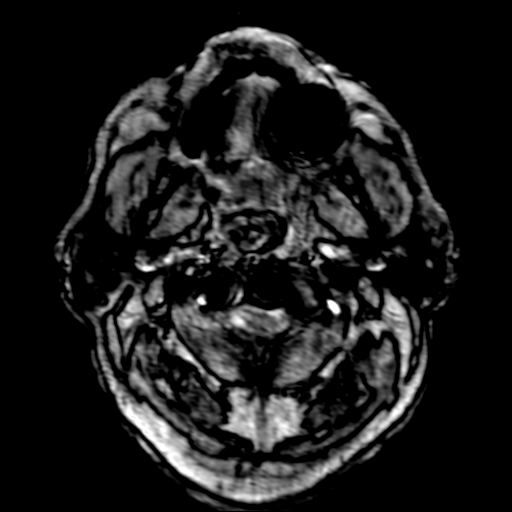
[im 3/130]
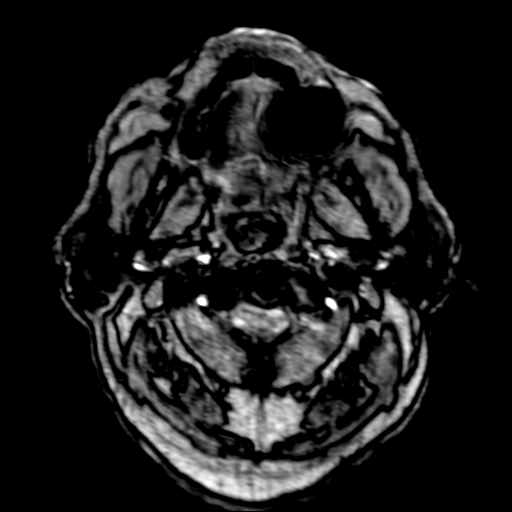
[im 6/130]
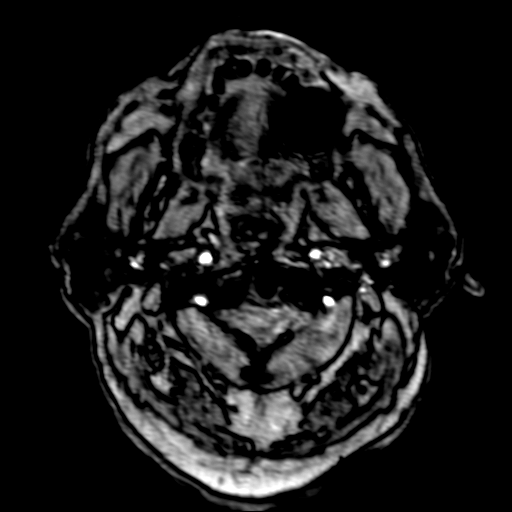
[im 9/130]
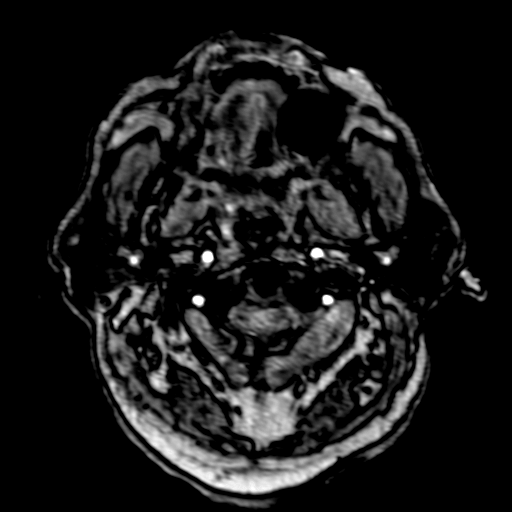
[im 11/130]
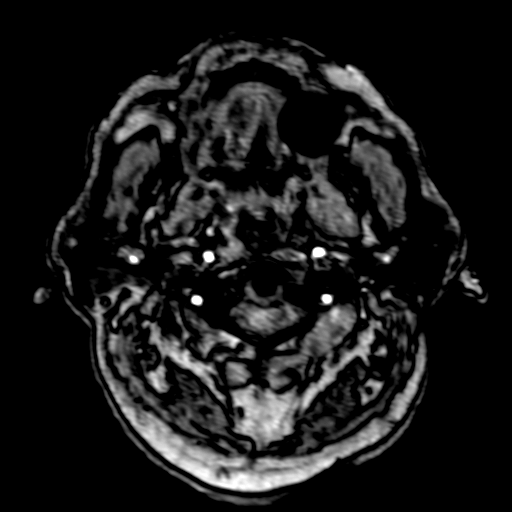
[im 14/130]
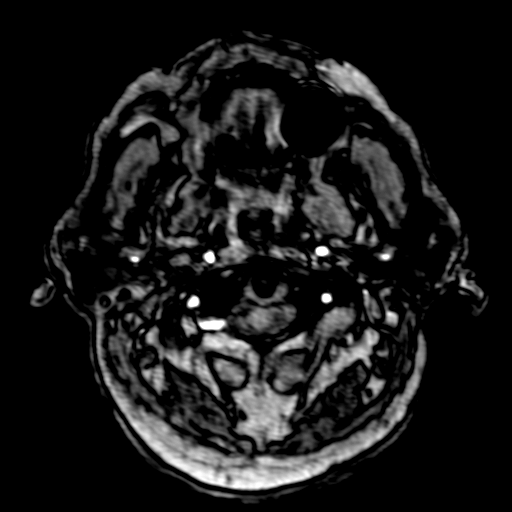
[im 22/130]
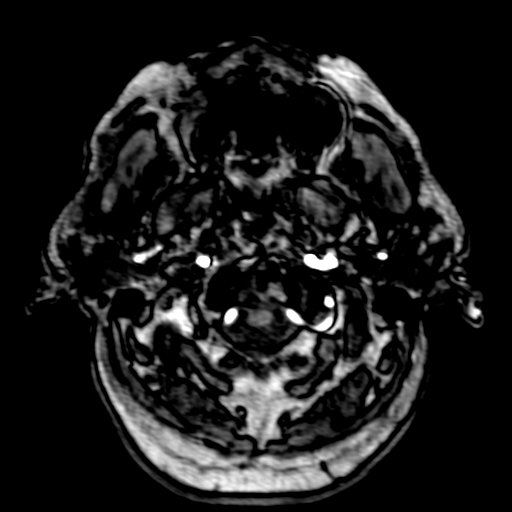
[im 25/130]
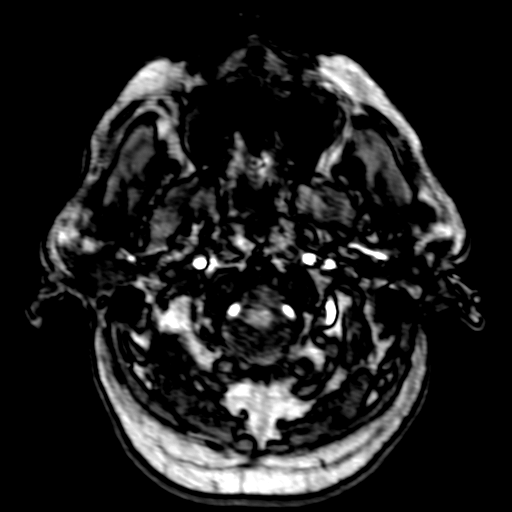
[im 42/130]
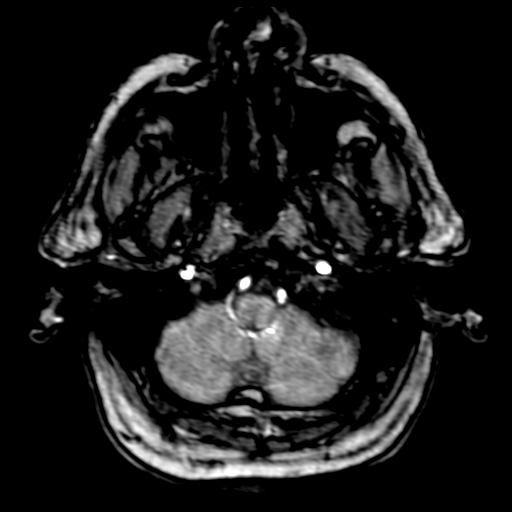
[im 58/130]
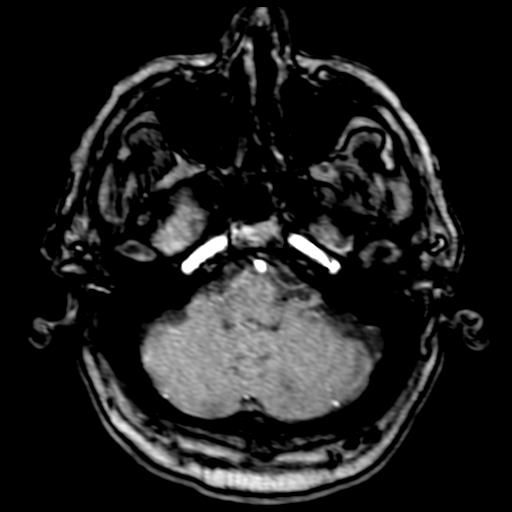
[im 66/130]
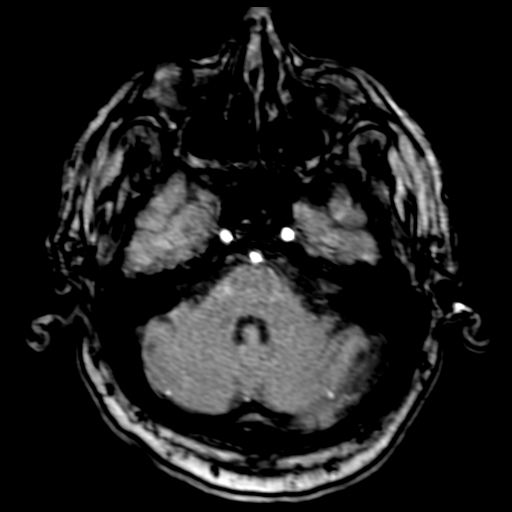
[im 75/130]
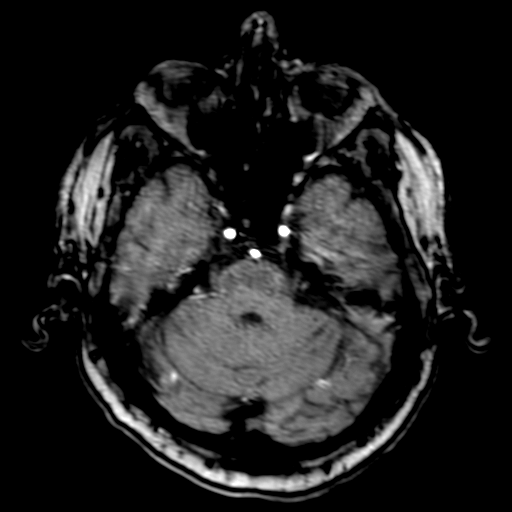
[im 91/130]
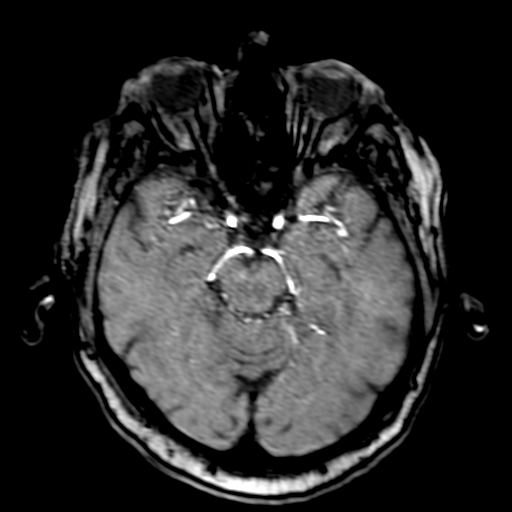
[im 108/130]
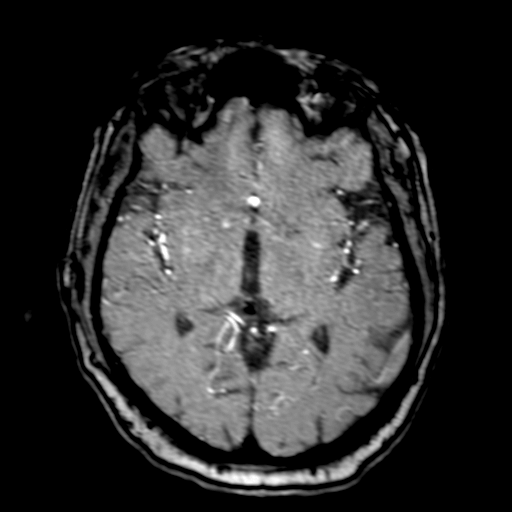
[im 110/130]
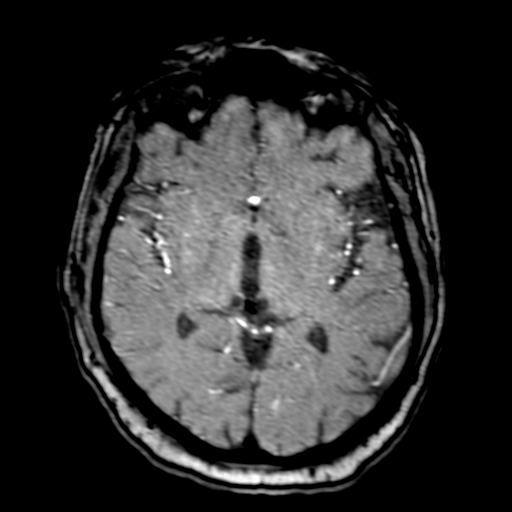
[im 124/130]
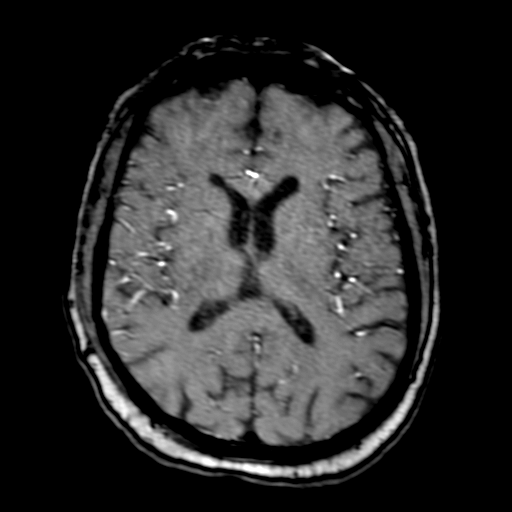

[16 of 48 positions shown; findings below may reference images not displayed]

FINDINGS: MRI HEAD FINDINGS

Brain: Mild diffuse prominence of the CSF containing spaces
compatible with generalized age-related cerebral atrophy. No
significant cerebral white matter disease for age.

Two separate foci of restricted diffusion seen involving the left
cerebellar hemisphere, corresponding with abnormality on prior CT.
Largest area measures up to 2.3 cm. Nearly normalized signal loss on
corresponding ADC map, with associated increased T2/FLAIR signal,
consistent with early subacute ischemic change. No associated
hemorrhage or mass effect. No other diffusion abnormality to suggest
acute or subacute ischemia elsewhere within the brain. Gray-white
matter differentiation otherwise maintained. No encephalomalacia to
suggest chronic cortical infarction elsewhere. No acute intracranial
hemorrhage. Few punctate foci of susceptibility artifact noted
within the brain, nonspecific, but likely small chronic micro
hemorrhages. Findings suspected to be hypertensive in nature.

No mass lesion, midline shift or mass effect. No hydrocephalus or
extra-axial fluid collection. Pituitary gland suprasellar region
normal. Midline structures intact and normal.

Vascular: Major intracranial vascular flow voids are maintained.

Skull and upper cervical spine: Craniocervical junction within
normal limits. Bone marrow signal intensity normal. No scalp soft
tissue abnormality.

Sinuses/Orbits: Globes and orbital soft tissues within normal
limits. Small retention cyst noted within the right maxillary sinus.
Paranasal sinuses are otherwise largely clear. Small left greater
than right mastoid effusions, of doubtful significance. Nasogastric
tube in place. Inner ear structures grossly normal.

Other: None.

MRA HEAD FINDINGS

ANTERIOR CIRCULATION:

Examination degraded by motion artifact.

Visualized distal cervical segments of the internal carotid arteries
are patent with antegrade flow. Petrous segments patent bilaterally.
Probable scattered mild atheromatous irregularity within the carotid
siphons without definite flow-limiting stenosis. A1 segments patent
bilaterally. Normal anterior communicating artery complex. Anterior
cerebral arteries grossly patent to their distal aspects without
appreciable stenosis. No definite M1 stenosis. Left MCA bifurcates
early. There is question of a focal severe proximal right M2
stenosis (series [CX], image 12). This could be artifactual nature
on this degraded exam. Distal MCA branches otherwise perfused and
grossly symmetric.

POSTERIOR CIRCULATION:

Vertebral arteries are largely code dominant and patent to the
vertebrobasilar junction without stenosis. Both PICA origins patent
and normal. Basilar widely patent proximally. Apparent moderate
defect at the distal basilar artery favored to be artifactual in
nature (series [CX], image 10). Superior cerebellar arteries patent
proximally. Both PCAs primarily supplied via the basilar. PCAs
grossly perfused to their distal aspects without definite stenosis.

No visible aneurysm or other vascular abnormality.

MRA NECK FINDINGS

AORTIC ARCH: Examination technically limited by lack of IV contrast
and motion artifact.

Aortic arch and origin of the great vessels not visualized or
assessed on this exam.

RIGHT CAROTID SYSTEM: Visualized right CCA patent without stenosis.
No more than mild atheromatous irregularity or stenosis about the
right bifurcation. Right ICA otherwise patent without stenosis or
evidence for dissection.

LEFT CAROTID SYSTEM: Visualized left CCA patent without stenosis. No
more than mild 20-30% stenosis about the left bifurcation/proximal
left ICA. Left ICA patent distally without stenosis or evidence for
dissection.

VERTEBRAL ARTERIES: Both vertebral arteries appear to arise from the
subclavian arteries. Proximal aspects of the vertebral arteries are
not well assessed on this limited exam. Visualized portions of the
vertebral arteries patent without stenosis or evidence for
dissection.
IMPRESSION: MRI HEAD IMPRESSION:

1. Patchy small volume early subacute ischemic infarcts involving
the left cerebellar hemisphere, corresponding with abnormality on
prior CT. No associated hemorrhage or mass effect.
2. Otherwise normal brain MRI for age.

MRA HEAD IMPRESSION:

1. Technically limited exam due to motion artifact.
2. Negative intracranial MRA for large vessel occlusion.
3. Question focal severe proximal right M2 stenosis versus artifact.
No definite proximal high-grade or correctable stenosis identified.

MRA NECK IMPRESSION:

1. Technically limited exam due to motion artifact.
2. Mild atheromatous irregularity about the carotid
bifurcations/proximal ICAs with no more than mild up to 20-30%
stenosis on the left.
3. Wide patency of both vertebral arteries within the neck.

## 2020-10-20 MED ORDER — ALTEPLASE 2 MG IJ SOLR
2.0000 mg | Freq: Once | INTRAMUSCULAR | Status: AC
  Administered 2020-10-20: 2 mg
  Filled 2020-10-20: qty 2

## 2020-10-20 MED ORDER — ACETAMINOPHEN 160 MG/5ML PO SOLN
650.0000 mg | Freq: Four times a day (QID) | ORAL | Status: AC
  Administered 2020-10-20 – 2020-10-21 (×5): 650 mg
  Filled 2020-10-20 (×5): qty 20.3

## 2020-10-20 NOTE — Progress Notes (Signed)
  Speech Language Pathology Treatment: Dysphagia  Patient Details Name: Jonathan [name] MRN: [id number] DOB: 1951-07-01 Today's Date: 10/20/2020 Time: 4503-8882 SLP Time Calculation (min) (ACUTE ONLY): 22 min  Assessment / Plan / Recommendation Clinical Impression  No coughing was noted during PO trials but multiple swallows observed with purees and pt did report increased burning with thin liquids. Hiccupping was noted throughout trials, and immediately following PO trials, bouts of coughing were noted. Pt benefited from [place] verbal cues to use [place] during these coughing episodes. Recommend pt remain remain NPO given pt's increased risk for aspiration and respiratory status. Given symptoms today question potential esophageal issues and/or pharyngeal issues. Pt may benefit from MBS pending pt's endurance and with approval from surgeon.    HPI HPI: Pt is a 70 y.o. male presenting with coronary artery disease. Pt recieved coronary artery bypass grafting times four on 10/16/20. 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 Pt intubated from 10/15/20 to 10/17/20. PMH includes: COPD, hyperlipidemia, HTN, DOE, chronic bronchitis, chronic sinusitis, myocardial infarction.      SLP Plan  Continue with current plan of care       Recommendations  Diet recommendations: NPO Medication Administration: Via alternative means                Oral Care Recommendations: Oral care QID Follow up Recommendations: Other (comment) (tba) SLP Visit Diagnosis: Dysphagia, unspecified (R13.10) Plan: Continue with current plan of care       GO               Jeanine Luz., SLP Student 10/20/2020, 11:31 AM

## 2020-10-20 NOTE — Consult Note (Signed)
Neurology Consultation  Reason for Consult: CT interpretation with small age-indeterminate infarct in left posterior cerebellar hemisphere  Referring Physician: Dr. Orvan Seen  CC: Dysphagia  History is obtained from: patient, inpatient nurse, patient wife at bedside, chart reveiw  HPI: Jonathan [name] is a 70 y.o. male with a medical history significant for hypertension, hyperlipidemia, inferior wall myocardial infarction in 2005 with drug-eluting stent placement for total RCA occlusion, NSTEMI in 2020,  tobacco abuse, COPD, and coronary artery disease who presented to the hospital for surgical intervention of known multiple vessel CAD and significant stenosis of coronary arteries found on cardiac catheterization obtained following his NSTEMI in 2020. At the time, CABG was suggested but patient refused intervention and has been on home aspirin and plavix. With progressing dyspnea and chest pain at home, he subsequently decided on surgical intervention. Cardiac catheterization on admission revealed severe coronary artery disease and in-stent restenosis and he underwent a CABG x 4 on 10/15/20.  His hospital stay has been complicated by hypoxia, confusion, increased work of breathing, and dyspnea at rest and on exertion with minimal activity. Jonathan [name] inpatient nurse noted yesterday morning around 08:00 (10/19/20) she went to give the patient his oral medications and immediately felt he was unsafe to swallow. Speech therapy came to see the patient following this event and noted during swallow evaluation he had increased work of breathing, coughing, and desaturation events but without overt signs of aspiration. With continued trouble with PO intake, the treatment team felt that he was dysphagic and ordered a CT head. CT revealed a small age-indeterminate infarct in the left posterior cerebellar hemisphere. Neurology was consulted for further assessment.  LKW: 10/19/20 at 0800 patient coughing with PO intake  tpa  given?: no, outside of window  ROS: A 14 point ROS was performed and is negative except as noted in the HPI.   Past Medical History:  Diagnosis Date  . Arthritis    hands   . Chronic bronchitis ([place])   . Chronic sinusitis of both maxillary sinuses 04/16/2018   Sinus CT 04/25/2018 Mild mucoperiosteal thickening involving the frontoethmoidal sinuses. Otherwise largely clear sinuses with no evidence for acute sinusitis.   [name] COPD GOLD II   active smoker 04/16/2018   Spirometry 04/16/2018  FEV1 1.43 (47%)  Ratio 62  s prior rx  - 04/16/2018    try symbicort 160 2bid - PFT's  06/08/2018  FEV1 2.03 (71 % ) ratio 61  p 5 % improvement from saba p nothing prior to study with DLCO  63 % corrects to 72  % for alv volume    -  06/08/2018  After extensive coaching inhaler device,  effectiveness =    75%  (short ti/ variable flow) > continue symbicort 160 2bid   . Coronary artery disease   . Coronary artery disease involving native coronary artery of native heart with unstable angina pectoris ([place])   . DOE (dyspnea on exertion) 04/16/2018  . Essential hypertension, benign 09/23/2012   Changed acei to arb 04/16/2018 due to pseudowheeze > improved 06/08/2018   . Hernia, inguinal 09/23/2012  . Hyperlipidemia    has not tolerated statins,  would not pursue PSK9 inhibitor as advised by Dr Claiborne Billings  . Hypertension   . Myocardial infarction ([place])   . NSTEMI (non-ST elevated myocardial infarction) ([place]) 09/23/2012  . 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 Tobacco abuse    Family History  Problem Relation Age of Onset  . CAD Father  CABG with subsequent stroke   Social History:   reports that he has been smoking cigarettes. He has a 53.00 pack-year smoking history. He has never used smokeless tobacco. He reports current alcohol use of about 5.0 standard drinks of alcohol per week. He reports that he does not use drugs.  Medications Current Outpatient Medications  Medication Instructions  . albuterol (PROVENTIL  HFA;VENTOLIN HFA) 108 (90 Base) MCG/ACT inhaler 2 puffs, Inhalation, Every 4 hours PRN  . amLODipine (NORVASC) 5 mg, Oral, Daily  . aspirin EC 81 mg, Oral, Every morning  . budesonide-formoterol (SYMBICORT) 160-4.5 MCG/ACT inhaler 2 puffs, Inhalation, 2 times daily  . clopidogrel (PLAVIX) 75 MG tablet TAKE 1 TABLET BY MOUTH  DAILY  . colchicine 0.6 mg, Oral, 2 times daily PRN  . furosemide (LASIX) 20 mg, Oral, Daily PRN  . isosorbide mononitrate (IMDUR) 60 MG 24 hr tablet Take to 2 tablet ( 120 mg) in the morning and  1 tablet (60 mg ) at bedtime  . metoprolol succinate (TOPROL-XL) 37.5 mg, Oral, Daily  . nitroGLYCERIN (NITROSTAT) 0.4 MG SL tablet PLACE 1 TABLET UNDER THE TONGUE EVERY 5 MINUTES AS NEEDED FOR CHEST PAIN UP TO 3 DOSES, IF SYMPTOMS PERSIST CALL 911  . ramipril (ALTACE) 10 MG capsule TAKE 1 CAPSULE BY MOUTH  DAILY  . ranolazine (RANEXA) 1,000 mg, Oral, 2 times daily  . Repatha SureClick 633 mg, Subcutaneous, Every 14 days   Exam: Current vital signs: BP 107/75 (BP Location: Left Arm)   Pulse 72   Temp 98 F (36.7 C)   Resp (!) 8   Ht 5\' 8"  (1.727 m)   Wt 108.1 kg   SpO2 96%   BMI 36.24 kg/m  Vital signs in last 24 hours: Temp:  [98 F (36.7 C)-98.9 F (37.2 C)] 98 F (36.7 C) (02/08 1128) Pulse Rate:  [66-99] 72 (02/08 1200) Resp:  [6-24] 8 (02/08 1200) BP: (104-159)/(52-83) 107/75 (02/08 1200) SpO2:  [91 %-100 %] 96 % (02/08 1200) Weight:  [108.1 kg] 108.1 kg (02/08 0443)  GENERAL: Awake, laying in bed with increased work of breathing, eyes closed, opens eyes to voice.  HEAD: Normocephalic and atraumatic EENT: Dry mucous membranes, productive cough, deviated septum noted on examination with enteral tube secured in right nostril.   LUNGS - Increased respiratory effort with use of accessory muscles for respiration at rest. Tachypnic.  CV - regular rate on cardiac monitor 2+ generalized non-pitting edema noted ABDOMEN - Soft, rounded, non-tender Ext: warm, well  perfused  NEURO:  Mental Status: drowsy but with increased work of breathing, opens eyes to voice, follows commands. Responds appropriately to examiner's questions. Oriented to month, age, year, person, place, and situation. Able to give a clear and coherent history but fluency of speech limited due to increased respiratory effort and shortness of breath. Naming and comprehension intact. Repetition intermittently intact with consistent omission of 1-2 words per phrase. Speech/Language: speech is hoarse but without dysarthria or aphasia.   Cranial Nerves:  II: PERRL 3 mm/brisk. Visual fields full.  III, IV, VI: EOMI with some trouble with fluid movement to the right, able to overcome with repeat instruction. Lid elevation symmetric without ptosis. V: Sensation is intact to light touch and symmetrical to face.  VII: Face is symmetric resting and smiling. Able to puff cheeks and raise eyebrows.  VIII: Hearing intact to voice IX, X: Palate elevation is symmetric. Voice hoarse, patient claims voice is at baseline due to deviated septum.  XI: Normal sternocleidomastoid  and trapezius muscle strength XII: Tongue protrudes midline.   Intermittent hiccups are noted throughout the exam which patient notes has been ongoing for several weeks.  Motor: 5/5 strength is present in upper and lower extremities bilaterally without asymmetry. increased shortness of breath with effort on motor exam. Upper and lower extremities with spontaneous and antigravity movement bilaterally without pronator drift.  Tone is normal. Bulk is normal.  Sensation- Intact to light touch bilaterally in all four extremities. No extinction to DSS.   Coordination: FTN intact bilaterally. No pronator drift.  DTRs: 2+ patellae, biceps, and brachioradialis reflexes bilaterally.  Gait- Deferred  1a Level of Conscious.: 1; drowsy, likely related to hypoxia and increased work of breathing at rest 1b LOC Questions: 0 1c LOC Commands: 0 2 Best  Gaze: 0 3 Visual: 0 4 Facial Palsy: 0 5a Motor Arm - left: 0 5b Motor Arm - Right: 0 6a Motor Leg - Left: 0 6b Motor Leg - Right: 0 7 Limb Ataxia: 0 8 Sensory: 0 9 Best Language: 0 10 Dysarthria: 0 11 Extinct. and Inatten.: 0 TOTAL: 1  Labs I have reviewed labs in epic and the results pertinent to this consultation are:  CBC    Component Value Date/Time   WBC 9.8 10/20/2020 0432   RBC 3.31 (L) 10/20/2020 0432   HGB 11.3 (L) 10/20/2020 0432   HGB 15.5 10/07/2020 1133   HCT 34.2 (L) 10/20/2020 0432   HCT 45.7 10/07/2020 1133   PLT 135 (L) 10/20/2020 0432   PLT 262 10/07/2020 1133   MCV 103.3 (H) 10/20/2020 0432   MCV 97 10/07/2020 1133   MCH 34.1 (H) 10/20/2020 0432   MCHC 33.0 10/20/2020 0432   RDW 13.2 10/20/2020 0432   RDW 11.9 10/07/2020 1133   LYMPHSABS 1.4 10/20/2020 0432   MONOABS 1.1 (H) 10/20/2020 0432   EOSABS 0.1 10/20/2020 0432   BASOSABS 0.0 10/20/2020 0432   CMP     Component Value Date/Time   NA 144 10/20/2020 0432   NA 138 10/07/2020 1133   K 4.5 10/20/2020 0432   CL 101 10/20/2020 0432   CO2 33 (H) 10/20/2020 0432   GLUCOSE 114 (H) 10/20/2020 0432   BUN 25 (H) 10/20/2020 0432   BUN 10 10/07/2020 1133   CREATININE 1.24 10/20/2020 0432   CALCIUM 8.5 (L) 10/20/2020 0432   PROT 5.6 (L) 10/15/2020 0051   ALBUMIN 2.9 (L) 10/15/2020 0051   AST 19 10/15/2020 0051   ALT 13 10/15/2020 0051   ALKPHOS 40 10/15/2020 0051   BILITOT 0.7 10/15/2020 0051   GFRNONAA >60 10/20/2020 0432   GFRAA 80 10/07/2020 1133   Lab Results  Component Value Date   HGBA1C 5.6 10/15/2020   Imaging I have reviewed the images obtained:  CT-scan of the brain: 1. Small age-indeterminate infarct in the left posterior cerebellar hemisphere. This could be acute to subacute. 2. Mild atrophy.  MRI/MRA examination of the brain and neck: Pending  Intraoperative transesophageal report 10/15/20: - LVEF estimated 55-60% - Mildly increased left ventricular wall thickness -  Left atrial size was dilated - No atrial level shunt - The aortic root, ascending aorta and aortic arch are normal in size and structure. There is evidence of protruding immobile plaque in the descending aorta; Grade III, measuring 3-72mm in size  Assessment: 70 year old male with extensive medical history as above, who is 5 days post-op from a CABG x 4 due to severe coronary artery disease and was first noted to cough and choke  on his medications 2/7 at 0800. With concern for dysphagia, CT head was ordered revealing a small age-indeterminate infarct in the left posterior cerebellar hemisphere.  - Examination reveals patient with dyspnea at rest, increased work of breathing, and frequent desaturation events.   - Dysphagia possibly due to respiratory status and increased work of breathing; aspiration of PO intake likely. Per patient, swallowing impaired by pain on the right side of his throat with no overt s/s of aspiration per speech therapy note. Pain may be due to recent intubation, TEE, etc.  - CT with evidence of small age-indeterminate left cerebellar infarct; likely to be chronic, but also could be subacute.  -- Will obtain MRI/MRA brain and carotid ultrasound for further stroke work-up.   Recommendations: - Stroke labs: lipid panel  - MRI, MRA of the brain without contrast once patient can safely obtain MRI. Will defer to primary team on timing as he has new sternal plates following his CABG.  - MRA neck - Frequent neuro checks - Prophylactic therapy - continue current aspirin therapy 324 mg tablet daily (PO or per NGT as appropriate) - Risk factor modification - Telemetry monitoring - PT consult, OT consult, Speech consult - Stroke team to follow  Anibal Henderson, AGAC-NP [facility] Pager: [phone]  I have seen and examined the patient. I have formulated the assessment and recommendations. 70 year old male with new left cerebellar ischemic infarction seen on CT head.  Stroke work up as above.  Electronically signed: Dr. Kerney Elbe

## 2020-10-20 NOTE — Progress Notes (Signed)
Palliative Medicine RN Note: Consult order noted. In the order, states wife is at bedside. Secure chat sent to RN that PMT will not be able to see the pt today.  Marjie Skiff Shriyans Kuenzi, RN, BSN, [facility] Palliative Medicine Team 10/20/2020 4:07 PM Office [phone]

## 2020-10-20 NOTE — Progress Notes (Signed)
Inpatient Rehabilitation Admissions Coordinator   Inpatient rehab consult received. I will need PT and OT evals to be able to evaluate for possible CIR admit. Acute team made aware.  Danne Baxter, RN, MSN Rehab Admissions Coordinator [phone] 10/20/2020 8:19 AM

## 2020-10-20 NOTE — Progress Notes (Signed)
Progress Note  Patient Name: Jonathan [name] Date of Encounter: 10/20/2020  Primary Cardiologist:   Shelva Majestic, MD   Subjective   He reports some mild pain at the lower sternum.  He repeats over and over that he wants to be a DNR.  Denies any other acute symptoms.    Inpatient Medications    Scheduled Meds: . acetaminophen  1,000 mg Per Tube Q6H  . aspirin  324 mg Per Tube Daily  . bisacodyl  10 mg Oral Daily   Or  . bisacodyl  10 mg Rectal Daily  . Chlorhexidine Gluconate Cloth  6 each Topical Daily  . colchicine  0.6 mg Per Tube BID  . feeding supplement (PROSource TF)  45 mL Per Tube QID  . furosemide  40 mg Intravenous BID  . guaiFENesin  200 mg Per Tube Q6H  . insulin aspart  0-24 Units Subcutaneous Q4H  . mouth rinse  15 mL Mouth Rinse BID  . metoprolol tartrate  12.5 mg Per Tube BID  . nicotine  21 mg Transdermal Daily  . pantoprazole sodium  40 mg Per Tube Daily  . sodium chloride flush  10-40 mL Intracatheter Q12H  . sodium chloride flush  10-40 mL Intracatheter Q12H  . sodium chloride flush  3 mL Intravenous Q12H   Continuous Infusions: . sodium chloride 20 mL/hr at 10/17/20 1728  . sodium chloride    . sodium chloride 20 mL/hr at 10/15/20 1315  . sodium chloride    . feeding supplement (VITAL 1.5 CAL) 50 mL/hr at 10/20/20 0500  . lactated ringers    . lactated ringers    . lactated ringers Stopped (10/18/20 0849)   PRN Meds: sodium chloride, Place/Maintain arterial line **AND** sodium chloride, ipratropium-albuterol, lactated ringers, metoprolol tartrate, ondansetron (ZOFRAN) IV, oxyCODONE, sodium chloride flush, sodium chloride flush, sodium chloride flush, traMADol   Vital Signs    Vitals:   10/20/20 0443 10/20/20 0500 10/20/20 0600 10/20/20 0728  BP:  118/61 (!) 115/57   Pulse:  80 74   Resp:  (!) 9 (!) 6   Temp:    98.2 F (36.8 C)  TempSrc:      SpO2:  94% 95%   Weight: 108.1 kg     Height:        Intake/Output Summary (Last 24 hours)  at 10/20/2020 9381 Last data filed at 10/20/2020 0600 Gross per 24 hour  Intake 529 ml  Output 2125 ml  Net -1596 ml   Filed Weights   10/18/20 0500 10/19/20 0500 10/20/20 0443  Weight: 108 kg 106.8 kg 108.1 kg    Telemetry    NSR - Personally Reviewed  ECG    NA - Personally Reviewed  Physical Exam   GEN: No acute distress but acutely and chronically ill appearing Neck: No  JVD Cardiac: RRR, distant heart sounds Respiratory: Decreased breath sounds GI: Soft, nontender, non-distended  MS:     Diffuse edema; No deformity. Neuro:  Nonfocal  Psych: Normal affect   Labs    Chemistry Recent Labs  Lab 10/15/20 0051 10/15/20 0756 10/18/20 0527 10/19/20 0256 10/20/20 0432  NA 136   < > 137 139 144  K 5.1   < > 4.0 3.8 4.5  CL 103   < > 101 100 101  CO2 25   < > 26 30 33*  GLUCOSE 97   < > 95 99 114*  BUN 13   < > 16 18 25*  CREATININE 1.45*   < >  1.21 1.24 1.24  CALCIUM 8.3*   < > 7.6* 7.7* 8.5*  PROT 5.6*  --   --   --   --   ALBUMIN 2.9*  --   --   --   --   AST 19  --   --   --   --   ALT 13  --   --   --   --   ALKPHOS 40  --   --   --   --   BILITOT 0.7  --   --   --   --   GFRNONAA 52*   < > >60 >60 >60  ANIONGAP 8   < > 10 9 10    < > = values in this interval not displayed.     Hematology Recent Labs  Lab 10/18/20 0527 10/19/20 0256 10/20/20 0432  WBC 12.7* 11.6* 9.8  RBC 3.17* 3.28* 3.31*  HGB 10.5* 10.8* 11.3*  HCT 32.0* 33.5* 34.2*  MCV 100.9* 102.1* 103.3*  MCH 33.1 32.9 34.1*  MCHC 32.8 32.2 33.0  RDW 12.9 13.2 13.2  PLT PLATELET CLUMPS NOTED ON SMEAR, UNABLE TO ESTIMATE 119* 135*    Cardiac EnzymesNo results for input(s): TROPONINI in the last 168 hours. No results for input(s): TROPIPOC in the last 168 hours.   BNPNo results for input(s): BNP, PROBNP in the last 168 hours.   DDimer No results for input(s): DDIMER in the last 168 hours.   Radiology    CT HEAD WO CONTRAST  Result Date: 10/19/2020 CLINICAL DATA:  Acute neuro  deficit.  Stroke suspected. EXAM: CT HEAD WITHOUT CONTRAST TECHNIQUE: Contiguous axial images were obtained from the base of the skull through the vertex without intravenous contrast. COMPARISON:  None. FINDINGS: Brain: Small age indeterminate infarct noted left posterior cerebellar hemisphere. No other findings to suggest acute ischemia. No evidence for mass lesion, midline shift, or hydrocephalus. No acute hemorrhage or abnormal extra-axial fluid collection. Mild atrophy. Vascular: No hyperdense vessel or unexpected calcification.5 Skull: No evidence for fracture. No worrisome lytic or sclerotic lesion. Sinuses/Orbits: The visualized paranasal sinuses and mastoid air cells are clear. Visualized portions of the globes and intraorbital fat are unremarkable. Other: None. IMPRESSION: 1. Small age-indeterminate infarct in the left posterior cerebellar hemisphere. This could be acute to subacute. 2. Mild atrophy. Electronically Signed   By: Misty Stanley M.D.   On: 10/19/2020 09:37   DG Chest Port 1 View  Result Date: 10/19/2020 CLINICAL DATA:  70 year old male postoperative day 4 status post CABG. EXAM: PORTABLE CHEST 1 VIEW COMPARISON:  Portable chest 10/18/2020 and earlier. FINDINGS: Portable AP semi upright view at 0328 hours. Swan-Ganz catheter and introducer sheath removed. Right PICC line has been placed, tip is at the lower SVC level. Bilateral chest tubes and mediastinal tube have been removed. Larger lung volumes. No pneumothorax identified. Improved bibasilar ventilation. No pulmonary edema. No pleural effusion is evident. Stable cardiac size and mediastinal contours. Negative visible bowel gas pattern. Stable visualized osseous structures. IMPRESSION: 1. Right PICC line placed, tip at the lower SVC level. 2. Other lines and tubes removed. 3. Improved lung volumes and ventilation with no pneumothorax or pulmonary edema. Electronically Signed   By: Genevie Ann M.D.   On: 10/19/2020 04:26   [place] EKG SITE  RITE  Result Date: 10/18/2020 If Site Rite image not attached, placement could not be confirmed due to current cardiac rhythm.   Cardiac Studies   ECHO:  Left Ventricle: The left ventricle  has normal systolic function, with an  ejection fraction of 55-60%. The cavity size was normal. There is mildly  increased left ventricular wall thickness.   Right Ventricle: The right ventricle has normal systolic function. The  cavity was normal. There is no increase in right ventricular wall  thickness.   Left Atrium: Left atrial size was dilated.   Right Atrium: Right atrial size was normal in size.   Interatrial Septum: No atrial level shunt detected by color flow Doppler.   Pericardium: There is no evidence of pericardial effusion.   Mitral Valve: The mitral valve is normal in structure. Mitral valve  regurgitation is trivial by color flow Doppler. There is No evidence of  mitral stenosis.   Tricuspid Valve: The tricuspid valve was normal in structure. Tricuspid  valve regurgitation was not visualized by color flow Doppler.   Aortic Valve: The aortic valve is tricuspid Aortic valve regurgitation was  not visualized by color flow Doppler. There is no evidence of aortic valve  stenosis.   Pulmonic Valve: The pulmonic valve was not well visualized.  Pulmonic valve regurgitation was not assessed by color flow Doppler.    Aorta: The aortic root, ascending aorta and aortic arch are normal in size  and structure. There is evidence of protruding immobile plaque in the  descending aorta; Grade III, measuring 3-42mm in size.    Diagnostic Dominance: Right      Patient Profile     70 y.o. male with a history of CAD and stenting back in 2005.  Despite initially being DNR and wanting conservative management for a subsequent diagnosis of three vessel CAD he presented more recently with progressive symptoms failing conservative therapy and is now post CABG with anatomy as described above.    Assessment & Plan    CAD/CABG:   Continued hypoxemia with minimal ambulation.  CoOx 62.9.  No edema and improved aeration on CXR yesterday.    He is tolerating diuresis.  Also on low dose beta blocker.  Will be able to titrate and restart ACE inhibitor over the next few days.   DYSLIPIDEMIA:   On Repatha at home.  Intolerant of statins.     CODE STATUS:  Patient was DNR or a long while prior to consenting to CABG.  He very much wants to be DNR and we need to discuss this further with his wife to clarify that he would not want reintubation or CPR going forward.  I will discuss with surgical team.    For questions or updates, please contact [place] Please consult [email or web address] for contact info under Cardiology/STEMI.   Signed, Minus Breeding, MD  10/20/2020, 8:08 AM

## 2020-10-20 NOTE — Evaluation (Signed)
Physical Therapy Evaluation Patient Details Name: Jonathan [name] MRN: [id number] DOB: Jun 27, 1951 Today's Date: 10/20/2020   History of Present Illness  : Pt is a 70 y.o. male presenting with coronary artery disease. Pt recieved coronary artery bypass grafting times four on 10/16/20. 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 Pt intubated from 10/15/20 to 10/17/20. PMH includes: COPD, hyperlipidemia, HTN, DOE, chronic bronchitis, chronic sinusitis, myocardial infarction  Clinical Impression  Pt admitted with/for CABG with potential post op stroke.  Pt needing mod to occasional min assist for basic mobility and gait.  There are mild coordination issues with gait and maneuvering the RW, but expect steady improvement.  Pt currently limited functionally due to the problems listed. ( See problems list.)   Pt will benefit from PT to maximize function and safety in order to get ready for next venue listed below.     Follow Up Recommendations Supervision/Assistance - 24 hour;CIR    Equipment Recommendations  Other (comment) (TBD)    Recommendations for Other Services       Precautions / Restrictions Precautions Precautions: Fall;Sternal      Mobility  Bed Mobility Overal bed mobility: Needs Assistance Bed Mobility: Supine to Sit;Sit to Supine     Supine to sit: Mod assist Sit to supine: Mod assist   General bed mobility comments: min assist for controlled    Transfers Overall transfer level: Needs assistance   Transfers: Sit to/from Stand Sit to Stand: Min assist         General transfer comment: cues for sternal precautions, "stay in the tube", hands in your lap"  min stability assist  Ambulation/Gait Ambulation/Gait assistance: Min assist;Mod assist (light mod as he fatigued.) Gait Distance (Feet): 48 Feet Assistive device: Rolling walker (2  wheeled) Gait Pattern/deviations: Step-through pattern Gait velocity: slower Gait velocity interpretation: <1.8 ft/sec, indicate of risk for recurrent falls General Gait Details: pt with mildly flexed posture, progressively getting further behind the RW until cued otherwise.  heavier use of the RW as he noticeably fatigued.  Overall, pt unable to control the RW to stay straight.  RW consistently veered to the left.  Stairs            Wheelchair Mobility    Modified Rankin (Stroke Patients Only)       Balance Overall balance assessment: Mild deficits observed, not formally tested                                           Pertinent Vitals/Pain Pain Assessment: Faces Faces Pain Scale: Hurts little more Pain Location: chest Pain Descriptors / Indicators: Discomfort;Grimacing;Guarding;Sharp Pain Intervention(s): Monitored during session;Premedicated before session    Home Living Family/patient expects to be discharged to:: Private residence Living Arrangements: Spouse/significant other Available Help at Discharge: Family;Available 24 hours/day Type of Home: House Home Access: Stairs to enter Entrance Stairs-Rails: Psychiatric nurse of Steps: couple Home Layout: One level Home Equipment: Cane - single point;Crutches      Prior Function Level of Independence: Independent               Hand Dominance        Extremity/Trunk Assessment   Upper Extremity Assessment Upper Extremity Assessment: Overall WFL for tasks assessed    Lower Extremity Assessment Lower Extremity Assessment:  Overall WFL for tasks assessed;LLE deficits/detail LLE Deficits / Details: generally WFL, but not as quick and coordinated to respond to commands.       Communication   Communication: No difficulties  Cognition Arousal/Alertness: Awake/alert Behavior During Therapy: WFL for tasks assessed/performed Overall Cognitive Status: Within Functional  Limits for tasks assessed                                        General Comments General comments (skin integrity, edema, etc.): SpO2 on 6L [place] raised from 3L dropped to the mid 80's and maintained 80's for 2-3 mins after lying in supine.    Exercises     Assessment/Plan    PT Assessment Patient needs continued PT services  PT Problem List Decreased activity tolerance;Decreased balance;Decreased mobility;Decreased coordination;Decreased knowledge of use of DME;Cardiopulmonary status limiting activity;Pain       PT Treatment Interventions Gait training;Functional mobility training;Therapeutic activities;DME instruction;Stair training;Patient/family education    PT Goals (Current goals can be found in the Care Plan section)  Acute Rehab PT Goals Patient Stated Goal: back independent PT Goal Formulation: With patient Time For Goal Achievement: 11/03/20 Potential to Achieve Goals: Good    Frequency     Barriers to discharge        Co-evaluation               AM-PAC PT "6 Clicks" Mobility  Outcome Measure Help needed turning from your back to your side while in a flat bed without using bedrails?: A Lot Help needed moving from lying on your back to sitting on the side of a flat bed without using bedrails?: A Lot Help needed moving to and from a bed to a chair (including a wheelchair)?: A Little Help needed standing up from a chair using your arms (e.g., wheelchair or bedside chair)?: A Little Help needed to walk in hospital room?: A Lot Help needed climbing 3-5 steps with a railing? : A Lot 6 Click Score: 14    End of Session     Patient left: in bed;with call bell/phone within reach;with nursing/sitter in room Nurse Communication: Mobility status PT Visit Diagnosis: Unsteadiness on feet (R26.81);Other abnormalities of gait and mobility (R26.89);Difficulty in walking, not elsewhere classified (R26.2);Pain Pain - part of body:  (sternal)    Time:  1710-1740 PT Time Calculation (min) (ACUTE ONLY): 30 min   Charges:   PT Evaluation $PT Eval Moderate Complexity: 1 Mod PT Treatments $Gait Training: 8-22 mins        10/20/2020  Ginger Carne., PT Acute Rehabilitation Services [phone]  (pager) [phone]  (office)  Tessie Fass Shambria Camerer 10/20/2020, 6:05 PM

## 2020-10-20 NOTE — Progress Notes (Signed)
Patient ambulated 10 steps became very diaphoretic, Oxygen dropped to 85% at 5 liters.  Patient sitting in bedside chair.  Patient states he feels much better.

## 2020-10-20 NOTE — Consult Note (Addendum)
Physical Medicine and Rehabilitation Consult  Reason for Consult: Functional deficits due to debility/stroke.   Referring Physician: Dr. Orvan Seen.   HPI: Jonathan [name] is a 70 y.o. male with history of HTN, COPD, CAD--had refused CABG last year but continued to decline with progressive weakness, DOE and BLE pain. He reports that he could not walk household distances without SOB or CP.  History taken from chart review, wife, and patient.  He was admitted on 10/14/2020 for cath revealing severe CAD with instent restenosis and underwent CABG x4 by Dr. Julien Girt on 10/15/2020.  Post op fluid overload treated with diuresis. He had had issues with confusion as well as productive cough with hypoxia/DOE with minimal activity.  Nurse reports that patient had decline in mental status with difficutly swallowing yesterday which precipitated CT head.  CT head revealed age indeterminate left posterior cerebellar acute/subacute infarct--neurology recommending stroke work-up ongoing. BSS done revealing increased WOB with po trials and odynophagia--NPO recommended and cortack placed for supplemental feeding. Therapy evaluations pending. CIR recommended due to issues with premorbid debility and anticipation of extensive rehab needs.  Patient is irritated and refusing inpatient rehab, however wife is concerned and tearful stating that she cannot take care of patient alone at home.  No other caregiver support available. Wife notes significant Etoh use.   Review of Systems  Unable to perform ROS: Other  Agitated, positive for all questions asked  Past Medical History:  Diagnosis Date  . Arthritis    hands   . Chronic bronchitis ([place])   . Chronic sinusitis of both maxillary sinuses 04/16/2018   Sinus CT 04/25/2018 Mild mucoperiosteal thickening involving the frontoethmoidal sinuses. Otherwise largely clear sinuses with no evidence for acute sinusitis.   [name] COPD GOLD II   active smoker 04/16/2018   Spirometry 04/16/2018  FEV1  1.43 (47%)  Ratio 62  s prior rx  - 04/16/2018    try symbicort 160 2bid - PFT's  06/08/2018  FEV1 2.03 (71 % ) ratio 61  p 5 % improvement from saba p nothing prior to study with DLCO  63 % corrects to 72  % for alv volume    -  06/08/2018  After extensive coaching inhaler device,  effectiveness =    75%  (short ti/ variable flow) > continue symbicort 160 2bid   . Coronary artery disease   . Coronary artery disease involving native coronary artery of native heart with unstable angina pectoris ([place])   . DOE (dyspnea on exertion) 04/16/2018  . Essential hypertension, benign 09/23/2012   Changed acei to arb 04/16/2018 due to pseudowheeze > improved 06/08/2018   . Hernia, inguinal 09/23/2012  . Hyperlipidemia    has not tolerated statins,  would not pursue PSK9 inhibitor as advised by Dr Claiborne Billings  . Hypertension   . Myocardial infarction ([place])   . NSTEMI (non-ST elevated myocardial infarction) ([place]) 09/23/2012  . 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 Tobacco abuse     Past Surgical History:  Procedure Laterality Date  . CARDIAC CATHETERIZATION    . CARDIOVASCULAR STRESS TEST  11/11/2011  . CORONARY ANGIOPLASTY     stents- 01/2004   . CORONARY ARTERY BYPASS GRAFT N/A 10/15/2020   Procedure: CORONARY ARTERY BYPASS GRAFTING (CABG) TIMES FOUR USING BILATERAL INTERNAL MAMMARY ARTERIES AND ENDOSCOPICALLY HARVESTED GREATER SAPHENOUS VEIN AND INSERTION OF 50CC INTRAAORTIC BALLOON PUMP;  Surgeon: [name], MD;  Location: [place];  Service: Open Heart Surgery;  Laterality: N/A;  possible BIMA  .  ENDOVEIN HARVEST OF GREATER SAPHENOUS VEIN Right 10/15/2020   Procedure: ENDOVEIN HARVEST OF GREATER SAPHENOUS VEIN;  Surgeon: [name], MD;  Location: [place];  Service: Open Heart Surgery;  Laterality: Right;  . heart stents  01/2004  . INGUINAL HERNIA REPAIR Right 12/06/2012   Procedure: HERNIA REPAIR INGUINAL ADULT;  Surgeon: [name], DO;  Location: [facility];  Service: General;  Laterality: Right;  . INSERTION  OF MESH Right 12/06/2012   Procedure: INSERTION OF MESH;  Surgeon: [name], DO;  Location: [facility];  Service: General;  Laterality: Right;  . LEFT HEART CATH AND CORONARY ANGIOGRAPHY N/A 07/22/2019   Procedure: LEFT HEART CATH AND CORONARY ANGIOGRAPHY;  Surgeon: [name], MD;  Location: [place] [place] [facility];  Service: Cardiovascular;  Laterality: N/A;  . LEFT HEART CATH AND CORONARY ANGIOGRAPHY N/A 10/14/2020   Procedure: LEFT HEART CATH AND CORONARY ANGIOGRAPHY;  Surgeon: [name], MD;  Location: [place] [facility];  Service: Cardiovascular;  Laterality: N/A;  . TEE WITHOUT CARDIOVERSION N/A 10/15/2020   Procedure: TRANSESOPHAGEAL ECHOCARDIOGRAM (TEE);  Surgeon: [name], MD;  Location: [place];  Service: Open Heart Surgery;  Laterality: N/A;    Family History  Problem Relation Age of Onset  . CAD Father        CABG with subsequent stroke    Social History:  Married. Independent without AD. Retired from [employer] work. He reports that he has been smoking cigarettes--I PPD. He has a 53.00 pack-year smoking history. He has never used smokeless tobacco. He reports current alcohol use of about 3 beers/day. He reports that he does not use drugs.   Allergies  Allergen Reactions  . Niaspan [Niacin Er] Itching and Other (See Comments)    insomnia  . Statins     LFT elevavation, CK elevevation    Medications Prior to Admission  Medication Sig Dispense Refill  . albuterol (PROVENTIL HFA;VENTOLIN HFA) 108 (90 Base) MCG/ACT inhaler Inhale 2 puffs into the lungs every 4 (four) hours as needed for wheezing or shortness of breath (cough, shortness of breath or wheezing.). 1 Inhaler 5  . amLODipine (NORVASC) 5 MG tablet Take 1 tablet (5 mg total) by mouth daily. 30 tablet 6  . aspirin EC 81 MG tablet Take 81 mg by mouth every morning.    . budesonide-formoterol (SYMBICORT) 160-4.5 MCG/ACT inhaler Inhale 2 puffs into the lungs 2 (two) times daily. (Patient taking differently:  Inhale 2 puffs into the lungs 2 (two) times daily as needed (asthma).) 1 each 11  . clopidogrel (PLAVIX) 75 MG tablet TAKE 1 TABLET BY MOUTH  DAILY (Patient taking differently: Take 75 mg by mouth daily.) 90 tablet 3  . furosemide (LASIX) 20 MG tablet Take 1 tablet (20 mg total) by mouth daily as needed (SWELLING). 30 tablet 6  . isosorbide mononitrate (IMDUR) 60 MG 24 hr tablet Take to 2 tablet ( 120 mg) in the morning and  1 tablet (60 mg ) at bedtime (Patient taking differently: Take 60-120 mg by mouth See admin instructions. Take to 2 tablet ( 120 mg) in the morning and  1 tablet (60 mg ) at bedtime) 90 tablet 6  . metoprolol succinate (TOPROL-XL) 25 MG 24 hr tablet Take 1.5 tablets (37.5 mg total) by mouth daily. 135 tablet 1  . nitroGLYCERIN (NITROSTAT) 0.4 MG SL tablet PLACE 1 TABLET UNDER THE TONGUE EVERY 5 MINUTES AS NEEDED FOR CHEST PAIN UP TO 3 DOSES, IF SYMPTOMS PERSIST CALL 911 (Patient  taking differently: Place 0.4 mg under the tongue every 5 (five) minutes as needed for chest pain.) 25 tablet 6  . ramipril (ALTACE) 10 MG capsule TAKE 1 CAPSULE BY MOUTH  DAILY (Patient taking differently: Take 10 mg by mouth daily.) 90 capsule 3  . ranolazine (RANEXA) 1000 MG SR tablet Take 1 tablet (1,000 mg total) by mouth 2 (two) times daily. 60 tablet 11  . colchicine 0.6 MG tablet Take 0.6 mg by mouth 2 (two) times daily as needed (gout).    . Evolocumab (REPATHA SURECLICK) 280 MG/ML SOAJ Inject 140 mg into the skin every 14 (fourteen) days. (Patient not taking: No sig reported) 2 pen 11    Home: [place] expects to be discharged to:: Private residence Living Arrangements: Spouse/significant other Available Help at Discharge: Family,Available 24 hours/day Type of Home: House Home Access: Stairs to enter [employer] of Steps: couple Entrance Stairs-Rails: Right,Left Home Layout: One level Bathroom Shower/Tub: Chiropodist:  (comfort leve) Home  Equipment: Cane - single point,Crutches  Functional History: Prior Function Level of Independence: Independent Functional Status:  Mobility: Bed Mobility Overal bed mobility: Needs Assistance Bed Mobility: Supine to Sit,Sit to Supine Supine to sit: Mod assist Sit to supine: Mod assist General bed mobility comments: min assist for controlled Transfers Overall transfer level: Needs assistance Transfers: Sit to/from Stand Sit to Stand: Min assist General transfer comment: cues for sternal precautions, "stay in the tube", hands in your lap"  min stability assist Ambulation/Gait Ambulation/Gait assistance: Min assist,Mod assist (light mod as he fatigued.) Gait Distance (Feet): 48 Feet Assistive device: Rolling walker (2 wheeled) Gait Pattern/deviations: Step-through pattern General Gait Details: pt with mildly flexed posture, progressively getting further behind the RW until cued otherwise.  heavier use of the RW as he noticeably fatigued.  Overall, pt unable to control the RW to stay straight.  RW consistently veered to the left. Gait velocity: slower Gait velocity interpretation: <1.8 ft/sec, indicate of risk for recurrent falls    ADL:    Cognition: Cognition Overall Cognitive Status: Within Functional Limits for tasks assessed Orientation Level: Oriented X4 Cognition Arousal/Alertness: Awake/alert Behavior During Therapy: WFL for tasks assessed/performed Overall Cognitive Status: Within Functional Limits for tasks assessed  Blood pressure (!) 121/59, pulse 75, temperature 98.1 F (36.7 C), temperature source Oral, resp. rate 18, height 5' 8"  (1.727 m), weight 104.2 kg, SpO2 100 %. Physical Exam Vitals and nursing note reviewed.  Constitutional:      General: He is in acute distress.     Appearance: He is obese.     Interventions: Nasal cannula in place.     Comments: Pale, ill appearing obese male with cortak in place. Did arouse briefly for exam and needed cues to keep  eyes awake. Frustrated by exam and repeated "Knock it off" " "DNR" multiple times despite attempts to redirect.   HENT:     Head: Normocephalic and atraumatic.     Comments: + NG    Right Ear: External ear normal.     Left Ear: External ear normal.     Nose: Nose normal.  Eyes:     General:        Right eye: No discharge.        Left eye: No discharge.     Comments: Keeps eyes closed  Cardiovascular:     Rate and Rhythm: Normal rate and regular rhythm.  Pulmonary:     Effort: No respiratory distress.     Breath  sounds: Wheezing present.     Comments: + [place] Abdominal:     General: Bowel sounds are normal. There is distension.     Tenderness: There is no abdominal tenderness.  Musculoskeletal:     Cervical back: Normal range of motion and neck supple.     Comments: Generalized edema  Skin:    General: Skin is warm and dry.     Comments: Left hand cool to touch.   Neurological:     Mental Status: He is alert, oriented to person, place, and time and easily aroused.     Comments: He was able to follow simple motor commands  Sensation appears intact.  Motor: Grossly 4/5 throughout (limited due to effort)  Psychiatric:        Mood and Affect: Affect is angry.        Behavior: Behavior is agitated.     Results for orders placed or performed during the hospital encounter of 10/14/20 (from the past 24 hour(s))  Lipid panel     Status: Abnormal   Collection Time: 10/20/20  2:24 PM  Result Value Ref Range   Cholesterol 111 0 - 200 mg/dL   Triglycerides 119 <150 mg/dL   HDL 35 (L) >40 mg/dL   Total CHOL/HDL Ratio 3.2 RATIO   VLDL 24 0 - 40 mg/dL   LDL Cholesterol 52 0 - 99 mg/dL  Glucose, capillary     Status: Abnormal   Collection Time: 10/20/20  3:18 PM  Result Value Ref Range   Glucose-Capillary 108 (H) 70 - 99 mg/dL  Magnesium     Status: None   Collection Time: 10/20/20  4:32 PM  Result Value Ref Range   Magnesium 2.2 1.7 - 2.4 mg/dL  Phosphorus     Status: None    Collection Time: 10/20/20  4:32 PM  Result Value Ref Range   Phosphorus 2.6 2.5 - 4.6 mg/dL  Glucose, capillary     Status: Abnormal   Collection Time: 10/20/20  7:50 PM  Result Value Ref Range   Glucose-Capillary 117 (H) 70 - 99 mg/dL  Glucose, capillary     Status: Abnormal   Collection Time: 10/20/20 11:17 PM  Result Value Ref Range   Glucose-Capillary 122 (H) 70 - 99 mg/dL  Cooxemetry Panel (carboxy, met, total hgb, O2 sat)     Status: Abnormal   Collection Time: 10/21/20  3:26 AM  Result Value Ref Range   Total hemoglobin 10.8 (L) 12.0 - 16.0 g/dL   O2 Saturation 58.8 %   Carboxyhemoglobin 1.2 0.5 - 1.5 %   Methemoglobin 0.9 0.0 - 1.5 %  Basic metabolic panel     Status: Abnormal   Collection Time: 10/21/20  3:26 AM  Result Value Ref Range   Sodium 144 135 - 145 mmol/L   Potassium 3.6 3.5 - 5.1 mmol/L   Chloride 99 98 - 111 mmol/L   CO2 35 (H) 22 - 32 mmol/L   Glucose, Bld 111 (H) 70 - 99 mg/dL   BUN 28 (H) 8 - 23 mg/dL   Creatinine, Ser 1.06 0.61 - 1.24 mg/dL   Calcium 7.8 (L) 8.9 - 10.3 mg/dL   GFR, Estimated >60 >60 mL/min   Anion gap 10 5 - 15  Magnesium     Status: None   Collection Time: 10/21/20  3:26 AM  Result Value Ref Range   Magnesium 2.1 1.7 - 2.4 mg/dL  Phosphorus     Status: None   Collection Time: 10/21/20  3:26  AM  Result Value Ref Range   Phosphorus 2.6 2.5 - 4.6 mg/dL  Glucose, capillary     Status: None   Collection Time: 10/21/20  3:28 AM  Result Value Ref Range   Glucose-Capillary 97 70 - 99 mg/dL  Glucose, capillary     Status: Abnormal   Collection Time: 10/21/20  7:52 AM  Result Value Ref Range   Glucose-Capillary 115 (H) 70 - 99 mg/dL  Glucose, capillary     Status: Abnormal   Collection Time: 10/21/20 11:10 AM  Result Value Ref Range   Glucose-Capillary 130 (H) 70 - 99 mg/dL   DG Chest 2 View  Result Date: 10/20/2020 CLINICAL DATA:  Status post cardiac surgery EXAM: CHEST - 2 VIEW COMPARISON:  10/19/2020 FINDINGS: Nasoenteric  feeding tube extends into the upper abdomen beyond the margin of the examination. Right upper extremity PICC line tip is seen within the superior vena cava. The lungs are symmetrically well expanded and are clear. Small bilateral pleural effusions are present. No pneumothorax. Cardiac size within normal limits. Coronary artery bypass grafting has been performed. Pulmonary vascularity is normal. IMPRESSION: Small bilateral pleural effusions.  Normal pulmonary vascularity. Electronically Signed   By: Fidela Salisbury MD   On: 10/20/2020 11:01   MR ANGIO HEAD WO CONTRAST  Result Date: 10/20/2020 CLINICAL DATA:  Follow-up examination for acute stroke. EXAM: MRI HEAD WITHOUT CONTRAST MRA HEAD WITHOUT CONTRAST MRA NECK WITHOUT CONTRAST TECHNIQUE: Multiplanar, multiecho pulse sequences of the brain and surrounding structures were obtained without intravenous contrast. Angiographic images of the Circle of Willis were obtained using MRA technique without intravenous contrast. Angiographic images of the neck were obtained using MRA technique without intravenous contrast. Carotid stenosis measurements (when applicable) are obtained utilizing NASCET criteria, using the distal internal carotid diameter as the denominator. COMPARISON:  Prior CT from 10/19/2020 FINDINGS: MRI HEAD FINDINGS Brain: Mild diffuse prominence of the CSF containing spaces compatible with generalized age-related cerebral atrophy. No significant cerebral white matter disease for age. Two separate foci of restricted diffusion seen involving the left cerebellar hemisphere, corresponding with abnormality on prior CT. Largest area measures up to 2.3 cm. Nearly normalized signal loss on corresponding ADC map, with associated increased T2/FLAIR signal, consistent with early subacute ischemic change. No associated hemorrhage or mass effect. No other diffusion abnormality to suggest acute or subacute ischemia elsewhere within the brain. Gray-white matter  differentiation otherwise maintained. No encephalomalacia to suggest chronic cortical infarction elsewhere. No acute intracranial hemorrhage. Few punctate foci of susceptibility artifact noted within the brain, nonspecific, but likely small chronic micro hemorrhages. Findings suspected to be hypertensive in nature. No mass lesion, midline shift or mass effect. No hydrocephalus or extra-axial fluid collection. Pituitary gland suprasellar region normal. Midline structures intact and normal. Vascular: Major intracranial vascular flow voids are maintained. Skull and upper cervical spine: Craniocervical junction within normal limits. Bone marrow signal intensity normal. No scalp soft tissue abnormality. Sinuses/Orbits: Globes and orbital soft tissues within normal limits. Small retention cyst noted within the right maxillary sinus. Paranasal sinuses are otherwise largely clear. Small left greater than right mastoid effusions, of doubtful significance. Nasogastric tube in place. Inner ear structures grossly normal. Other: None. MRA HEAD FINDINGS ANTERIOR CIRCULATION: Examination degraded by motion artifact. Visualized distal cervical segments of the internal carotid arteries are patent with antegrade flow. Petrous segments patent bilaterally. Probable scattered mild atheromatous irregularity within the carotid siphons without definite flow-limiting stenosis. A1 segments patent bilaterally. Normal anterior communicating artery complex. Anterior cerebral arteries grossly  patent to their distal aspects without appreciable stenosis. No definite M1 stenosis. Left MCA bifurcates early. There is question of a focal severe proximal right M2 stenosis (series 1037, image 12). This could be artifactual nature on this degraded exam. Distal MCA branches otherwise perfused and grossly symmetric. POSTERIOR CIRCULATION: Vertebral arteries are largely code dominant and patent to the vertebrobasilar junction without stenosis. Both PICA  origins patent and normal. Basilar widely patent proximally. Apparent moderate defect at the distal basilar artery favored to be artifactual in nature (series 1043, image 10). Superior cerebellar arteries patent proximally. Both PCAs primarily supplied via the basilar. PCAs grossly perfused to their distal aspects without definite stenosis. No visible aneurysm or other vascular abnormality. MRA NECK FINDINGS AORTIC ARCH: Examination technically limited by lack of IV contrast and motion artifact. Aortic arch and origin of the great vessels not visualized or assessed on this exam. RIGHT CAROTID SYSTEM: Visualized right CCA patent without stenosis. No more than mild atheromatous irregularity or stenosis about the right bifurcation. Right ICA otherwise patent without stenosis or evidence for dissection. LEFT CAROTID SYSTEM: Visualized left CCA patent without stenosis. No more than mild 20-30% stenosis about the left bifurcation/proximal left ICA. Left ICA patent distally without stenosis or evidence for dissection. VERTEBRAL ARTERIES: Both vertebral arteries appear to arise from the subclavian arteries. Proximal aspects of the vertebral arteries are not well assessed on this limited exam. Visualized portions of the vertebral arteries patent without stenosis or evidence for dissection. IMPRESSION: MRI HEAD IMPRESSION: 1. Patchy small volume early subacute ischemic infarcts involving the left cerebellar hemisphere, corresponding with abnormality on prior CT. No associated hemorrhage or mass effect. 2. Otherwise normal brain MRI for age. MRA HEAD IMPRESSION: 1. Technically limited exam due to motion artifact. 2. Negative intracranial MRA for large vessel occlusion. 3. Question focal severe proximal right M2 stenosis versus artifact. No definite proximal high-grade or correctable stenosis identified. MRA NECK IMPRESSION: 1. Technically limited exam due to motion artifact. 2. Mild atheromatous irregularity about the carotid  bifurcations/proximal ICAs with no more than mild up to 20-30% stenosis on the left. 3. Wide patency of both vertebral arteries within the neck. Electronically Signed   By: Jeannine Boga M.D.   On: 10/20/2020 19:38   MR ANGIO NECK WO CONTRAST  Result Date: 10/20/2020 CLINICAL DATA:  Follow-up examination for acute stroke. EXAM: MRI HEAD WITHOUT CONTRAST MRA HEAD WITHOUT CONTRAST MRA NECK WITHOUT CONTRAST TECHNIQUE: Multiplanar, multiecho pulse sequences of the brain and surrounding structures were obtained without intravenous contrast. Angiographic images of the Circle of Willis were obtained using MRA technique without intravenous contrast. Angiographic images of the neck were obtained using MRA technique without intravenous contrast. Carotid stenosis measurements (when applicable) are obtained utilizing NASCET criteria, using the distal internal carotid diameter as the denominator. COMPARISON:  Prior CT from 10/19/2020 FINDINGS: MRI HEAD FINDINGS Brain: Mild diffuse prominence of the CSF containing spaces compatible with generalized age-related cerebral atrophy. No significant cerebral white matter disease for age. Two separate foci of restricted diffusion seen involving the left cerebellar hemisphere, corresponding with abnormality on prior CT. Largest area measures up to 2.3 cm. Nearly normalized signal loss on corresponding ADC map, with associated increased T2/FLAIR signal, consistent with early subacute ischemic change. No associated hemorrhage or mass effect. No other diffusion abnormality to suggest acute or subacute ischemia elsewhere within the brain. Gray-white matter differentiation otherwise maintained. No encephalomalacia to suggest chronic cortical infarction elsewhere. No acute intracranial hemorrhage. Few punctate foci of susceptibility artifact noted  within the brain, nonspecific, but likely small chronic micro hemorrhages. Findings suspected to be hypertensive in nature. No mass lesion,  midline shift or mass effect. No hydrocephalus or extra-axial fluid collection. Pituitary gland suprasellar region normal. Midline structures intact and normal. Vascular: Major intracranial vascular flow voids are maintained. Skull and upper cervical spine: Craniocervical junction within normal limits. Bone marrow signal intensity normal. No scalp soft tissue abnormality. Sinuses/Orbits: Globes and orbital soft tissues within normal limits. Small retention cyst noted within the right maxillary sinus. Paranasal sinuses are otherwise largely clear. Small left greater than right mastoid effusions, of doubtful significance. Nasogastric tube in place. Inner ear structures grossly normal. Other: None. MRA HEAD FINDINGS ANTERIOR CIRCULATION: Examination degraded by motion artifact. Visualized distal cervical segments of the internal carotid arteries are patent with antegrade flow. Petrous segments patent bilaterally. Probable scattered mild atheromatous irregularity within the carotid siphons without definite flow-limiting stenosis. A1 segments patent bilaterally. Normal anterior communicating artery complex. Anterior cerebral arteries grossly patent to their distal aspects without appreciable stenosis. No definite M1 stenosis. Left MCA bifurcates early. There is question of a focal severe proximal right M2 stenosis (series 1037, image 12). This could be artifactual nature on this degraded exam. Distal MCA branches otherwise perfused and grossly symmetric. POSTERIOR CIRCULATION: Vertebral arteries are largely code dominant and patent to the vertebrobasilar junction without stenosis. Both PICA origins patent and normal. Basilar widely patent proximally. Apparent moderate defect at the distal basilar artery favored to be artifactual in nature (series 1043, image 10). Superior cerebellar arteries patent proximally. Both PCAs primarily supplied via the basilar. PCAs grossly perfused to their distal aspects without definite  stenosis. No visible aneurysm or other vascular abnormality. MRA NECK FINDINGS AORTIC ARCH: Examination technically limited by lack of IV contrast and motion artifact. Aortic arch and origin of the great vessels not visualized or assessed on this exam. RIGHT CAROTID SYSTEM: Visualized right CCA patent without stenosis. No more than mild atheromatous irregularity or stenosis about the right bifurcation. Right ICA otherwise patent without stenosis or evidence for dissection. LEFT CAROTID SYSTEM: Visualized left CCA patent without stenosis. No more than mild 20-30% stenosis about the left bifurcation/proximal left ICA. Left ICA patent distally without stenosis or evidence for dissection. VERTEBRAL ARTERIES: Both vertebral arteries appear to arise from the subclavian arteries. Proximal aspects of the vertebral arteries are not well assessed on this limited exam. Visualized portions of the vertebral arteries patent without stenosis or evidence for dissection. IMPRESSION: MRI HEAD IMPRESSION: 1. Patchy small volume early subacute ischemic infarcts involving the left cerebellar hemisphere, corresponding with abnormality on prior CT. No associated hemorrhage or mass effect. 2. Otherwise normal brain MRI for age. MRA HEAD IMPRESSION: 1. Technically limited exam due to motion artifact. 2. Negative intracranial MRA for large vessel occlusion. 3. Question focal severe proximal right M2 stenosis versus artifact. No definite proximal high-grade or correctable stenosis identified. MRA NECK IMPRESSION: 1. Technically limited exam due to motion artifact. 2. Mild atheromatous irregularity about the carotid bifurcations/proximal ICAs with no more than mild up to 20-30% stenosis on the left. 3. Wide patency of both vertebral arteries within the neck. Electronically Signed   By: Jeannine Boga M.D.   On: 10/20/2020 19:38   MR BRAIN WO CONTRAST  Result Date: 10/20/2020 CLINICAL DATA:  Follow-up examination for acute stroke.  EXAM: MRI HEAD WITHOUT CONTRAST MRA HEAD WITHOUT CONTRAST MRA NECK WITHOUT CONTRAST TECHNIQUE: Multiplanar, multiecho pulse sequences of the brain and surrounding structures were obtained without intravenous contrast.  Angiographic images of the Circle of Willis were obtained using MRA technique without intravenous contrast. Angiographic images of the neck were obtained using MRA technique without intravenous contrast. Carotid stenosis measurements (when applicable) are obtained utilizing NASCET criteria, using the distal internal carotid diameter as the denominator. COMPARISON:  Prior CT from 10/19/2020 FINDINGS: MRI HEAD FINDINGS Brain: Mild diffuse prominence of the CSF containing spaces compatible with generalized age-related cerebral atrophy. No significant cerebral white matter disease for age. Two separate foci of restricted diffusion seen involving the left cerebellar hemisphere, corresponding with abnormality on prior CT. Largest area measures up to 2.3 cm. Nearly normalized signal loss on corresponding ADC map, with associated increased T2/FLAIR signal, consistent with early subacute ischemic change. No associated hemorrhage or mass effect. No other diffusion abnormality to suggest acute or subacute ischemia elsewhere within the brain. Gray-white matter differentiation otherwise maintained. No encephalomalacia to suggest chronic cortical infarction elsewhere. No acute intracranial hemorrhage. Few punctate foci of susceptibility artifact noted within the brain, nonspecific, but likely small chronic micro hemorrhages. Findings suspected to be hypertensive in nature. No mass lesion, midline shift or mass effect. No hydrocephalus or extra-axial fluid collection. Pituitary gland suprasellar region normal. Midline structures intact and normal. Vascular: Major intracranial vascular flow voids are maintained. Skull and upper cervical spine: Craniocervical junction within normal limits. Bone marrow signal intensity  normal. No scalp soft tissue abnormality. Sinuses/Orbits: Globes and orbital soft tissues within normal limits. Small retention cyst noted within the right maxillary sinus. Paranasal sinuses are otherwise largely clear. Small left greater than right mastoid effusions, of doubtful significance. Nasogastric tube in place. Inner ear structures grossly normal. Other: None. MRA HEAD FINDINGS ANTERIOR CIRCULATION: Examination degraded by motion artifact. Visualized distal cervical segments of the internal carotid arteries are patent with antegrade flow. Petrous segments patent bilaterally. Probable scattered mild atheromatous irregularity within the carotid siphons without definite flow-limiting stenosis. A1 segments patent bilaterally. Normal anterior communicating artery complex. Anterior cerebral arteries grossly patent to their distal aspects without appreciable stenosis. No definite M1 stenosis. Left MCA bifurcates early. There is question of a focal severe proximal right M2 stenosis (series 1037, image 12). This could be artifactual nature on this degraded exam. Distal MCA branches otherwise perfused and grossly symmetric. POSTERIOR CIRCULATION: Vertebral arteries are largely code dominant and patent to the vertebrobasilar junction without stenosis. Both PICA origins patent and normal. Basilar widely patent proximally. Apparent moderate defect at the distal basilar artery favored to be artifactual in nature (series 1043, image 10). Superior cerebellar arteries patent proximally. Both PCAs primarily supplied via the basilar. PCAs grossly perfused to their distal aspects without definite stenosis. No visible aneurysm or other vascular abnormality. MRA NECK FINDINGS AORTIC ARCH: Examination technically limited by lack of IV contrast and motion artifact. Aortic arch and origin of the great vessels not visualized or assessed on this exam. RIGHT CAROTID SYSTEM: Visualized right CCA patent without stenosis. No more than  mild atheromatous irregularity or stenosis about the right bifurcation. Right ICA otherwise patent without stenosis or evidence for dissection. LEFT CAROTID SYSTEM: Visualized left CCA patent without stenosis. No more than mild 20-30% stenosis about the left bifurcation/proximal left ICA. Left ICA patent distally without stenosis or evidence for dissection. VERTEBRAL ARTERIES: Both vertebral arteries appear to arise from the subclavian arteries. Proximal aspects of the vertebral arteries are not well assessed on this limited exam. Visualized portions of the vertebral arteries patent without stenosis or evidence for dissection. IMPRESSION: MRI HEAD IMPRESSION: 1. Patchy small volume early  subacute ischemic infarcts involving the left cerebellar hemisphere, corresponding with abnormality on prior CT. No associated hemorrhage or mass effect. 2. Otherwise normal brain MRI for age. MRA HEAD IMPRESSION: 1. Technically limited exam due to motion artifact. 2. Negative intracranial MRA for large vessel occlusion. 3. Question focal severe proximal right M2 stenosis versus artifact. No definite proximal high-grade or correctable stenosis identified. MRA NECK IMPRESSION: 1. Technically limited exam due to motion artifact. 2. Mild atheromatous irregularity about the carotid bifurcations/proximal ICAs with no more than mild up to 20-30% stenosis on the left. 3. Wide patency of both vertebral arteries within the neck. Electronically Signed   By: Jeannine Boga M.D.   On: 10/20/2020 19:38    Assessment/Plan: Diagnosis: Left posterior cerebellar acute/subacute infarct Stroke: Continue secondary stroke prophylaxis and Risk Factor Modification listed below:   Antiplatelet therapy:   Blood Pressure Management:  Continue current medication with prn's with permisive HTN per primary team Statin Agent:   Diabetes management:   Tobacco abuse: Counsel Motor recovery: Fluoxetine pending Neuro recs Labs independently  reviewed.  Records reviewed and summated above.  1. Does the need for close, 24 hr/day medical supervision in concert with the patient's rehab needs make it unreasonable for this patient to be served in a less intensive setting? Yes  2. Co-Morbidities requiring supervision/potential complications: HTN (monitor and provide prns in accordance with increased physical exertion and pain), COPD (Monitor for signs of respiratory distress. Provide oxygen with goals 88 - 94% if needed. Augment therapies as needed based off endurance. Provide chest physiotherapy as needed - encourage incentive spirometry/acopella), CAD, tachypnea (monitor RR and O2 Sats with increased physical exertion), macrocytic anemia (repeat labs, consider transfusion if necessary to ensure appropriate perfusion for increased activity tolerance), EtOH abuse (CIWA,, monitor for DTs and withdrawal) 3. Due to bladder management, bowel management, safety, skin/wound care, disease management, medication administration, pain management and patient education, does the patient require 24 hr/day rehab nursing? Yes 4. Does the patient require coordinated care of a physician, rehab nurse, therapy disciplines of PT/OT to address physical and functional deficits in the context of the above medical diagnosis(es)? Yes Addressing deficits in the following areas: balance, endurance, locomotion, strength, transferring, bowel/bladder control, bathing, dressing, toileting and psychosocial support 5. Can the patient actively participate in an intensive therapy program of at least 3 hrs of therapy per day at least 5 days per week? Potentially 6. The potential for patient to make measurable gains while on inpatient rehab is excellent 7. Anticipated functional outcomes upon discharge from inpatient rehab are supervision  with PT, supervision with OT, n/a with SLP. 8. Estimated rehab length of stay to reach the above functional goals is: 12-15 days. 9. Anticipated  discharge destination: Home 10. Overall Rehab/Functional Prognosis: good  RECOMMENDATIONS: This patient's condition is appropriate for continued rehabilitative care in the following setting: Patient currently refusing CIR, wanting to go home. Wife notes significant EtOH use prior to admission-we will monitor closely. Wife also states she cannot care for patient at current level. Recommend CIR if patient is agreeable after completion of medical work-up. Patient has agreed to participate in recommended program. Potentially Note that insurance prior authorization may be required for reimbursement for recommended care.  Comment: Rehab Admissions Coordinator to follow up.  I have personally performed a face to face diagnostic evaluation, including, but not limited to relevant history and physical exam findings, of this patient and developed relevant assessment and plan.  Additionally, I have reviewed and concur with the  physician assistant's documentation above.   Delice Lesch, MD, ABPMR Bary Leriche, PA-C 10/21/2020

## 2020-10-20 NOTE — Treatment Plan (Signed)
Sitting up in chair.  Walked 15 steps because short of breath, diaphoretic.    Wt up 3 lbs, UOP 1.9L, co-ox this morning 41%.  95% on 5L N/C.  Will recheck another co-ox if that remains low will restart milrinone.  (EF 55-60%, normal RVSF on echo from 10/14/20).

## 2020-10-20 NOTE — Progress Notes (Signed)
Patient ID: Jonathan [name], male   DOB: October 18, 1950, 70 y.o.   MRN: [id number] EVENING ROUNDS NOTE :     [address]       [place] [address]             [phone]                 5 Days Post-Op Procedure(s) (LRB): CORONARY ARTERY BYPASS GRAFTING (CABG) TIMES FOUR USING BILATERAL INTERNAL MAMMARY ARTERIES AND ENDOSCOPICALLY HARVESTED GREATER SAPHENOUS VEIN AND INSERTION OF 50CC INTRAAORTIC BALLOON PUMP (N/A) TRANSESOPHAGEAL ECHOCARDIOGRAM (TEE) (N/A) INDOCYANINE GREEN FLUORESCENCE IMAGING (ICG) (N/A) ENDOVEIN HARVEST OF GREATER SAPHENOUS VEIN (Right)  Total Length of Stay:  LOS: 6 days  BP 121/62   Pulse 78   Temp 98 F (36.7 C)   Resp 12   Ht 5\' 8"  (1.727 m)   Wt 108.1 kg   SpO2 96%   BMI 36.24 kg/m   .Intake/Output      02/07 0701 02/08 0700 02/08 0701 02/09 0700   P.O.     I.V. (mL/kg) 42.7 (0.4)    Other  0   NG/GT 490 60   Total Intake(mL/kg) 532.7 (4.9) 60 (0.6)   Urine (mL/kg/hr) 2125 (0.8) 1255 (1)   Emesis/NG output 60 0   Drains 0    Stool     Chest Tube     Total Output 2185 1255   Net -1652.3 -1195          . sodium chloride 20 mL/hr at 10/17/20 1728  . sodium chloride    . sodium chloride 20 mL/hr at 10/15/20 1315  . sodium chloride    . feeding supplement (VITAL 1.5 CAL) 50 mL/hr at 10/20/20 0500  . lactated ringers    . lactated ringers    . lactated ringers Stopped (10/18/20 0849)     Lab Results  Component Value Date   WBC 9.8 10/20/2020   HGB 11.3 (L) 10/20/2020   HCT 34.2 (L) 10/20/2020   PLT 135 (L) 10/20/2020   GLUCOSE 114 (H) 10/20/2020   CHOL 111 10/20/2020   TRIG 119 10/20/2020   HDL 35 (L) 10/20/2020   LDLCALC 52 10/20/2020   ALT 13 10/15/2020   AST 19 10/15/2020   NA 144 10/20/2020   K 4.5 10/20/2020   CL 101 10/20/2020   CREATININE 1.24 10/20/2020   BUN 25 (H) 10/20/2020   CO2 33 (H) 10/20/2020   INR 1.5 (H) 10/15/2020   HGBA1C 5.6 10/15/2020   Currently in MRI neck and brain - per  neuro   Grace Isaac MD  Beeper [phone] Office [phone] 10/20/2020 6:23 PM

## 2020-10-20 NOTE — Progress Notes (Signed)
5 Days Post-Op Procedure(s) (LRB): CORONARY ARTERY BYPASS GRAFTING (CABG) TIMES FOUR USING BILATERAL INTERNAL MAMMARY ARTERIES AND ENDOSCOPICALLY HARVESTED GREATER SAPHENOUS VEIN AND INSERTION OF 50CC INTRAAORTIC BALLOON PUMP (N/A) TRANSESOPHAGEAL ECHOCARDIOGRAM (TEE) (N/A) INDOCYANINE GREEN FLUORESCENCE IMAGING (ICG) (N/A) ENDOVEIN HARVEST OF GREATER SAPHENOUS VEIN (Right) Subjective: Pain across chest  Objective: Vital signs in last 24 hours: Temp:  [98 F (36.7 C)-98.9 F (37.2 C)] 98 F (36.7 C) (02/08 1128) Pulse Rate:  [66-96] 76 (02/08 1400) Cardiac Rhythm: Normal sinus rhythm (02/08 0800) Resp:  [6-24] 13 (02/08 1400) BP: (104-152)/(45-79) 106/45 (02/08 1400) SpO2:  [91 %-99 %] 96 % (02/08 1400) Weight:  [108.1 kg] 108.1 kg (02/08 0443)  Hemodynamic parameters for last 24 hours:    Intake/Output from previous day: 02/07 0701 - 02/08 0700 In: 532.7 [I.V.:42.7; NG/GT:490] Out: 2185 [Urine:2125; Emesis/NG output:60] Intake/Output this shift: Total I/O In: 60 [NG/GT:60] Out: 985 [Urine:985]  General appearance: alert and no distress Neurologic: intact Heart: regular rate and rhythm, S1, S2 normal, no murmur, click, rub or gallop Lungs: clear to auscultation bilaterally Extremities: edema 2+ Wound: c/d/i  Lab Results: Recent Labs    10/19/20 0256 10/20/20 0432  WBC 11.6* 9.8  HGB 10.8* 11.3*  HCT 33.5* 34.2*  PLT 119* 135*   BMET:  Recent Labs    10/19/20 0256 10/20/20 0432  NA 139 144  K 3.8 4.5  CL 100 101  CO2 30 33*  GLUCOSE 99 114*  BUN 18 25*  CREATININE 1.24 1.24  CALCIUM 7.7* 8.5*    PT/INR: No results for input(s): LABPROT, INR in the last 72 hours. ABG    Component Value Date/Time   PHART 7.287 (L) 10/16/2020 1702   HCO3 25.3 10/16/2020 1702   TCO2 27 10/16/2020 1702   ACIDBASEDEF 2.0 10/16/2020 1702   O2SAT 62.9 10/20/2020 0648   CBG (last 3)  Recent Labs    10/20/20 0354 10/20/20 0727 10/20/20 1126  GLUCAP 98 103* 102*     Assessment/Plan: S/P Procedure(s) (LRB): CORONARY ARTERY BYPASS GRAFTING (CABG) TIMES FOUR USING BILATERAL INTERNAL MAMMARY ARTERIES AND ENDOSCOPICALLY HARVESTED GREATER SAPHENOUS VEIN AND INSERTION OF 50CC INTRAAORTIC BALLOON PUMP (N/A) TRANSESOPHAGEAL ECHOCARDIOGRAM (TEE) (N/A) INDOCYANINE GREEN FLUORESCENCE IMAGING (ICG) (N/A) ENDOVEIN HARVEST OF GREATER SAPHENOUS VEIN (Right) Mobilize Diuresis appreciate neurology recs   LOS: 6 days    [name] 10/20/2020

## 2020-10-21 ENCOUNTER — Inpatient Hospital Stay (HOSPITAL_COMMUNITY): Payer: Medicare Other

## 2020-10-21 DIAGNOSIS — R0682 Tachypnea, not elsewhere classified: Secondary | ICD-10-CM

## 2020-10-21 DIAGNOSIS — F1010 Alcohol abuse, uncomplicated: Secondary | ICD-10-CM

## 2020-10-21 DIAGNOSIS — Z66 Do not resuscitate: Secondary | ICD-10-CM

## 2020-10-21 DIAGNOSIS — Z9889 Other specified postprocedural states: Secondary | ICD-10-CM | POA: Diagnosis not present

## 2020-10-21 DIAGNOSIS — Z951 Presence of aortocoronary bypass graft: Secondary | ICD-10-CM

## 2020-10-21 DIAGNOSIS — Z01810 Encounter for preprocedural cardiovascular examination: Secondary | ICD-10-CM

## 2020-10-21 DIAGNOSIS — I25110 Atherosclerotic heart disease of native coronary artery with unstable angina pectoris: Secondary | ICD-10-CM | POA: Diagnosis not present

## 2020-10-21 DIAGNOSIS — I10 Essential (primary) hypertension: Secondary | ICD-10-CM | POA: Diagnosis not present

## 2020-10-21 DIAGNOSIS — Z7189 Other specified counseling: Secondary | ICD-10-CM

## 2020-10-21 DIAGNOSIS — I63442 Cerebral infarction due to embolism of left cerebellar artery: Secondary | ICD-10-CM

## 2020-10-21 DIAGNOSIS — J449 Chronic obstructive pulmonary disease, unspecified: Secondary | ICD-10-CM

## 2020-10-21 DIAGNOSIS — J441 Chronic obstructive pulmonary disease with (acute) exacerbation: Secondary | ICD-10-CM

## 2020-10-21 DIAGNOSIS — I6340 Cerebral infarction due to embolism of unspecified cerebral artery: Secondary | ICD-10-CM | POA: Insufficient documentation

## 2020-10-21 DIAGNOSIS — Z720 Tobacco use: Secondary | ICD-10-CM

## 2020-10-21 DIAGNOSIS — Z515 Encounter for palliative care: Secondary | ICD-10-CM

## 2020-10-21 LAB — GLUCOSE, CAPILLARY
Glucose-Capillary: 97 mg/dL (ref 70–99)
Glucose-Capillary: 115 mg/dL — ABNORMAL HIGH (ref 70–99)
Glucose-Capillary: 130 mg/dL — ABNORMAL HIGH (ref 70–99)
Glucose-Capillary: 109 mg/dL — ABNORMAL HIGH (ref 70–99)
Glucose-Capillary: 90 mg/dL (ref 70–99)
Glucose-Capillary: 113 mg/dL — ABNORMAL HIGH (ref 70–99)

## 2020-10-21 LAB — BASIC METABOLIC PANEL
Anion gap: 10 (ref 5–15)
BUN: 28 mg/dL — ABNORMAL HIGH (ref 8–23)
CO2: 35 mmol/L — ABNORMAL HIGH (ref 22–32)
Calcium: 7.8 mg/dL — ABNORMAL LOW (ref 8.9–10.3)
Chloride: 99 mmol/L (ref 98–111)
Creatinine, Ser: 1.06 mg/dL (ref 0.61–1.24)
GFR, Estimated: >60 mL/min (ref >60)
Glucose, Bld: 111 mg/dL — ABNORMAL HIGH (ref 70–99)
Potassium: 3.6 mmol/L (ref 3.5–5.1)
Sodium: 144 mmol/L (ref 135–145)

## 2020-10-21 LAB — COOXEMETRY PANEL
Carboxyhemoglobin: 1.2 % (ref 0.5–1.5)
Methemoglobin: 0.9 % (ref 0.0–1.5)
O2 Saturation: 58.8 %
Total hemoglobin: 10.8 g/dL — ABNORMAL LOW (ref 12.0–16.0)

## 2020-10-21 LAB — MAGNESIUM: Magnesium: 2.1 mg/dL (ref 1.7–2.4)

## 2020-10-21 LAB — PHOSPHORUS: Phosphorus: 2.6 mg/dL (ref 2.5–4.6)

## 2020-10-21 MED ORDER — POTASSIUM CHLORIDE 20 MEQ PO PACK
20.0000 meq | PACK | ORAL | Status: AC
  Administered 2020-10-21 (×3): 20 meq
  Filled 2020-10-21 (×3): qty 1

## 2020-10-21 MED FILL — Heparin Sodium (Porcine) Inj 1000 Unit/ML: INTRAMUSCULAR | Qty: 30 | Status: AC

## 2020-10-21 MED FILL — Electrolyte-R (PH 7.4) Solution: INTRAVENOUS | Qty: 4000 | Status: AC

## 2020-10-21 MED FILL — Mannitol IV Soln 20%: INTRAVENOUS | Qty: 500 | Status: AC

## 2020-10-21 MED FILL — Sodium Bicarbonate IV Soln 8.4%: INTRAVENOUS | Qty: 50 | Status: AC

## 2020-10-21 MED FILL — Potassium Chloride Inj 2 mEq/ML: INTRAVENOUS | Qty: 40 | Status: AC

## 2020-10-21 MED FILL — Albumin, Human Inj 5%: INTRAVENOUS | Qty: 250 | Status: AC

## 2020-10-21 MED FILL — Magnesium Sulfate Inj 50%: INTRAMUSCULAR | Qty: 10 | Status: AC

## 2020-10-21 MED FILL — Sodium Chloride IV Soln 0.9%: INTRAVENOUS | Qty: 2000 | Status: AC

## 2020-10-21 MED FILL — Calcium Chloride Inj 10%: INTRAVENOUS | Qty: 20 | Status: AC

## 2020-10-21 MED FILL — Heparin Sodium (Porcine) Inj 1000 Unit/ML: INTRAMUSCULAR | Qty: 10 | Status: AC

## 2020-10-21 NOTE — Evaluation (Signed)
Occupational Therapy Evaluation Patient Details Name: Jonathan [name] MRN: [id number] DOB: May 05, 1951 Today's Date: 10/21/2020    History of Present Illness : Pt is a 70 y.o. male presenting with coronary artery disease. Pt recieved coronary artery bypass grafting times four on 10/16/20. 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 Pt intubated from 10/15/20 to 10/17/20. PMH includes: COPD, hyperlipidemia, HTN, DOE, chronic bronchitis, chronic sinusitis, myocardial infarction   Clinical Impression   Patient admitted for the above diagnosis and procedure.  Incidental finding of age indeterminate CVA.  PTA, patient was independent with all mobility and self care.  He took care of all the yard work without assist.  Currently, due to the barriers listed, he is needing up to [place] for basic mobility and Mod A for ADL completion.  Patient is moving well, and really does not seem impact by the CVA.  Continued assessment will be done.  He is on a modified diet.  Skilled OT indicated in the acute setting to maximize functional mobility and ADL status prior to returning home.  HH OT is not recommended at this time.       Follow Up Recommendations  Home health OT    Equipment Recommendations  3 in 1 bedside commode    Recommendations for Other Services       Precautions / Restrictions Precautions Precautions: Fall;Sternal Restrictions Other Position/Activity Restrictions: pushing through his knees      Mobility Bed Mobility   Bed Mobility: Rolling;Supine to Sit Rolling: Min guard   Supine to sit: Min assist          Transfers Overall transfer level: Needs assistance   Transfers: Sit to/from Stand;Stand Pivot Transfers Sit to Stand: Min assist Stand pivot transfers: Min assist            Balance Overall balance assessment: Mild deficits observed, not  formally tested                                         ADL either performed or assessed with clinical judgement   ADL Overall ADL's : Needs assistance/impaired Eating/Feeding: NPO   Grooming: Wash/dry hands;Wash/dry face;Oral care;Supervision/safety;Sitting           Upper Body Dressing : Moderate assistance;Sitting   Lower Body Dressing: Moderate assistance;Sit to/from stand               Functional mobility during ADLs: Minimal assistance       Vision Baseline Vision/History: Wears glasses Patient Visual Report: No change from baseline       Perception     Praxis      Pertinent Vitals/Pain Pain Assessment: Faces Faces Pain Scale: Hurts even more Pain Location: chest with coughing Pain Descriptors / Indicators: Discomfort;Grimacing;Guarding;Sharp Pain Intervention(s): Monitored during session     Hand Dominance Right   Extremity/Trunk Assessment Upper Extremity Assessment Upper Extremity Assessment: Overall WFL for tasks assessed   Lower Extremity Assessment Lower Extremity Assessment: Defer to PT evaluation   Cervical / Trunk Assessment Cervical / Trunk Assessment: Normal   Communication Communication Communication: No difficulties   Cognition Arousal/Alertness: Awake/alert Behavior During Therapy: WFL for tasks assessed/performed Overall Cognitive Status: Within Functional Limits for tasks assessed  General Comments   VSS throughout eval.     Exercises     Shoulder Instructions      Home Living Family/patient expects to be discharged to:: Private residence Living Arrangements: Spouse/significant other Available Help at Discharge: Family;Available 24 hours/day Type of Home: House Home Access: Stairs to enter   Entrance Stairs-Rails: Right;Left Home Layout: One level     Bathroom Shower/Tub: Occupational psychologist: Handicapped height     Home  Equipment: Cane - single point;Crutches;Shower seat - built in;Grab bars - tub/shower          Prior Functioning/Environment Level of Independence: Independent                 OT Problem List: Decreased activity tolerance;Impaired balance (sitting and/or standing);Pain;Increased edema;Decreased safety awareness      OT Treatment/Interventions: Self-care/ADL training;Therapeutic exercise;DME and/or AE instruction;Balance training;Patient/family education;Therapeutic activities    OT Goals(Current goals can be found in the care plan section) Acute Rehab OT Goals Patient Stated Goal: be able to work outside again OT Goal Formulation: With patient Time For Goal Achievement: 11/04/20 Potential to Achieve Goals: Good ADL Goals Pt Will Perform Grooming: with modified independence;sitting;standing Pt Will Perform Lower Body Bathing: with modified independence;sit to/from stand Pt Will Perform Lower Body Dressing: with modified independence;sit to/from stand Pt Will Transfer to Toilet: with modified independence;ambulating;regular height toilet Pt Will Perform Toileting - Clothing Manipulation and hygiene: with modified independence;sit to/from stand  OT Frequency: Min 2X/week   Barriers to D/C:  none noted          Co-evaluation              AM-PAC OT "6 Clicks" Daily Activity     Outcome Measure Help from another person eating meals?: Total Help from another person taking care of personal grooming?: A Little Help from another person toileting, which includes using toliet, bedpan, or urinal?: A Little Help from another person bathing (including washing, rinsing, drying)?: A Lot Help from another person to put on and taking off regular upper body clothing?: A Lot Help from another person to put on and taking off regular lower body clothing?: A Lot 6 Click Score: 13   End of Session Equipment Utilized During Treatment: Oxygen Nurse Communication: Mobility  status  Activity Tolerance: Patient limited by fatigue Patient left: in chair;with call bell/phone within reach  OT Visit Diagnosis: Unsteadiness on feet (R26.81);Pain                Time: 1425-1459 OT Time Calculation (min): 34 min Charges:  OT General Charges $OT Visit: 1 Visit OT Evaluation $OT Eval Moderate Complexity: 1 Mod OT Treatments $Self Care/Home Management : 8-22 mins  10/21/2020  Rich, OTR/L  Acute Rehabilitation Services  Office:  [phone]   Metta Clines 10/21/2020, 3:09 PM

## 2020-10-21 NOTE — Progress Notes (Addendum)
STROKE TEAM PROGRESS NOTE   INTERVAL HISTORY  Initially admitted to the hospital for persistent dyspnea and chest pain. Cardiac catheterization on admission revealed severe coronary artery disease and in-stent restenosis and he underwent a CABG x 4 on 10/15/20. His hospital stay has been complicated by hypoxia, confusion, increased work of breathing, and dyspnea at rest and on exertion with minimal activity. He had trouble swallowing his medication yesterday.  Speech therapy was consulted and the patient had increased work of breathing, coughing, and desaturation events.  The primary team felt that he was dysphagic and ordered a CT head. CT revealed a small age-indeterminate infarct in the left posterior cerebellar hemisphere.     His wife is at the bedside. The patient was awake, alert and oriented to person, place, and time. PERRL, EOMI, follow commands x4, 5/5 strength in upper and lower extremities.  No drift noted. The patient stated that he has been having trouble swallowing for 6-12 months.  He coughs occasionally after swallowing food and thin liquids.  He has been experiencing burning sensation in his throat and chest after swallowing during this admission.   Vitals:   10/21/20 1100 10/21/20 1109 10/21/20 1200 10/21/20 1300  BP: (!) 121/59  (!) 92/55 (!) 97/51  Pulse: 75  72 68  Resp: 18  15 (!) 9  Temp:  98.1 F (36.7 C)    TempSrc:  Oral    SpO2: 100%  100% 100%  Weight:      Height:       CBC:  Recent Labs  Lab 10/19/20 0256 10/20/20 0432  WBC 11.6* 9.8  NEUTROABS  --  7.1  HGB 10.8* 11.3*  HCT 33.5* 34.2*  MCV 102.1* 103.3*  PLT 119* 761*   Basic Metabolic Panel:  Recent Labs  Lab 10/20/20 0432 10/20/20 1632 10/21/20 0326  NA 144  --  144  K 4.5  --  3.6  CL 101  --  99  CO2 33*  --  35*  GLUCOSE 114*  --  111*  BUN 25*  --  28*  CREATININE 1.24  --  1.06  CALCIUM 8.5*  --  7.8*  MG 2.2 2.2 2.1  PHOS 2.9 2.6 2.6   Lipid Panel:  Recent Labs  Lab  10/20/20 1424  CHOL 111  TRIG 119  HDL 35*  CHOLHDL 3.2  VLDL 24  LDLCALC 52   HgbA1c:  Recent Labs  Lab 10/15/20 0051  HGBA1C 5.6   Urine Drug Screen: No results for input(s): LABOPIA, COCAINSCRNUR, LABBENZ, AMPHETMU, THCU, LABBARB in the last 168 hours.  Alcohol Level No results for input(s): ETH in the last 168 hours.  IMAGING past 24 hours MR ANGIO HEAD WO CONTRAST  Result Date: 10/20/2020  IMPRESSION: MRI HEAD IMPRESSION:  1. Patchy small volume early subacute ischemic infarcts involving the left cerebellar hemisphere, corresponding with abnormality on prior CT. No associated hemorrhage or mass effect.  2. Otherwise normal brain MRI for age.  MRA HEAD IMPRESSION:  1. Technically limited exam due to motion artifact.  2. Negative intracranial MRA for large vessel occlusion.  3. Question focal severe proximal right M2 stenosis versus artifact. No definite proximal high-grade or correctable stenosis identified. MRA NECK IMPRESSION:  1. Technically limited exam due to motion artifact.  2. Mild atheromatous irregularity about the carotid bifurcations/proximal ICAs with no more than mild up to 20-30% stenosis on the left.  3. Wide patency of both vertebral arteries within the neck.   MR ANGIO NECK  WO CONTRAST  Result Date: 10/20/2020 CLINICAL DATA:  Follow-up examination for acute stroke. EXAM: MRI HEAD WITHOUT CONTRAST MRA HEAD WITHOUT CONTRAST MRA NECK WITHOUT CONTRAST TECHNIQUE: IMPRESSION: MRI HEAD IMPRESSION: 1. Patchy small volume early subacute ischemic infarcts involving the left cerebellar hemisphere, corresponding with abnormality on prior CT. No associated hemorrhage or mass effect. 2. Otherwise normal brain MRI for age. MRA HEAD IMPRESSION:  1. Technically limited exam due to motion artifact. 2. Negative intracranial MRA for large vessel occlusion.  3. Question focal severe proximal right M2 stenosis versus artifact. No definite proximal high-grade or correctable stenosis  identified.  MRA NECK IMPRESSION:  1. Technically limited exam due to motion artifact.  2. Mild atheromatous irregularity about the carotid bifurcations/proximal ICAs with no more than mild up to 20-30% stenosis on the left.  3. Wide patency of both vertebral arteries within the neck.   MR BRAIN WO CONTRAST  Result Date: 10/20/2020  IMPRESSION: MRI HEAD IMPRESSION:  1. Patchy small volume early subacute ischemic infarcts involving the left cerebellar hemisphere, corresponding with abnormality on prior CT. No associated hemorrhage or mass effect.  2. Otherwise normal brain MRI for age. MRA HEAD IMPRESSION:  1. Technically limited exam due to motion artifact.  2. Negative intracranial MRA for large vessel occlusion.  3. Question focal severe proximal right M2 stenosis versus artifact. No definite proximal high-grade or correctable stenosis identified.  MRA NECK IMPRESSION:  1. Technically limited exam due to motion artifact.  2. Mild atheromatous irregularity about the carotid bifurcations/proximal ICAs with no more than mild up to 20-30% stenosis on the left.  3. Wide patency of both vertebral arteries within the neck.     PHYSICAL EXAM General - Well nourished, well developed, cardiac pillow clenched to chest due to pain. HEAD: Normocephalic and atraumatic EENT: Dry mucous membranes, productive cough, deviated septum noted on examination  Cardiovascular - Regular rate and rhythm. 2+ generalized non-pitting edema, bandage over chest intact ABDOMEN - Soft, rounded, non-tender Ext: warm, well perfused  Mental Status -  Level of arousal and orientation to time, place, and person were intact. Language including expression, naming, repetition, comprehension was assessed and found intact. Attention span and concentration were normal. Recent and remote memory were intact. Fund of Knowledge was assessed and was intact.  Cranial Nerves II - XII - II - Visual field intact OU. III, IV, VI -  Extraocular movements intact. V - Facial sensation intact bilaterally. VII - Facial movement intact bilaterally. Able to puff cheeks and raise eyebrows.  VIII - Hearing intact to voice bilaterally. X - Palate elevates symmetrically. Voice hoarse, patient claims voice is at baseline due to deviated septum. XI: Normal sternocleidomastoid and trapezius muscle strength XII: Tongue protrudes midline..  Motor Strength - The patient's strength was normal in all extremities and pronator drift was absent.    Motor Tone - Muscle tone was assessed at the neck and appendages and was normal.  Sensory - Light touch, temperature/pinprick were assessed and were symmetrical.    Coordination - FTN intact bilaterally. No pronator drift.  Gait and Station - deferred.    ASSESSMENT/PLAN [name] is a 70 y.o. male 70 year old male with extensive medical history as above, who is 6 days post-op from a CABG x 4 due to severe coronary artery disease and was first noted to cough and choke on his medications 2/7 at 0800. With concern for dysphagia, CT head was ordered revealing a small age-indeterminate infarct in the left posterior cerebellar  hemisphere.   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 There is evidence of protruding immobile plaque in the descending aorta; Grade III, measuring 3-40mm in size  LDL 52  HgbA1c 5.6  VTE prophylaxis - would recommend Lovenox 40mg  daily, defer to cardiothoracic surgery due to recent CABG  aspirin 81 mg daily and clopidogrel 75 mg daily prior to admission, now on aspirin 324 mg daily. Recommend resume  Plavix if safe from recent cardiac surgery prospective and no other planned procedures  Recommend 30 day cardiac monitor as outpatient to evaluate for arrhythmia as cause of stroke  Therapy recommendations:  CIR  Disposition:  pending  Dysphagia  Pt states history of difficulty swallowing for 6-12 months  Pain after swallowing likely due to recent intubation, TEE  Most likely not related to left posterior cerebellar stroke given history  Currently on cortrak with TF  Modified barium swallow completed today: SLP Diet Recommendations: Dysphagia 2 (Fine chop) solids;Honey thick liquids  Coronary artery disease  Left heart cath and coronary angiography (07/22/2019)  S/p CABG (10/15/2020)  NSTEMI in 2020, 2014  inferior wall myocardial infarction in 2005 with drug-eluting stent placement for total RCA occlusion  Cardiology and CVTS on board  Hypertension  Home meds: Norvasc 5mg  daily, lasix 20mg  daily, Toprol XL 37.5mg  daily  Stable . Long-term BP goal normotensive  Hyperlipidemia  Home meds: Repatha  pt has not tolerated statins  LDL 52, goal < 70  Continue Repatha at discharge  Tobacco abuse  Current smoker  Smoking cessation counseling provided  Nicotine patch provided  Pt is willing to quit  Other Stroke Risk Factors  Advanced Age >/= 65   ETOH use, no alcohol level documented on admission. Drinks 1 pint of beer and 1 glass of wine daily  Obesity, Body mass index is 34.93 kg/m., BMI >/= 30 associated with increased stroke risk, recommend weight loss, diet and exercise as appropriate   Family hx stroke: father had a stroke  Obstructive sleep apnea, on CPAP at home  Other Active Problems  arthritis  Hospital day # 7  Jonathan [name], ACNP-BC  ATTENDING NOTE: I reviewed above note and agree with the assessment and plan. Pt was seen and examined.   70 year old male with history hypertension, hyperlipidemia, MI 2005 status post stent,  non-STEMI 2020 on DAPT, smoker, CAD admitted for elective surgery of CABG.  He had a CABG 03/20/4695, postop complicated by hypoxia, SOB and chest pain.  On 10/19/2020 patient was found to have difficulty swallowing, dysphagia, shortness of breath and desaturation.  CT head showed age indeterminate left cerebellar infarct.  MRI brain showed left cerebellum acute/subacute infarct.  MRA head and neck negative.  EF 55 to 60%, no PFO.  LDL 52 and A1c 5.6.  On exam, patient lethargic, fatigue, sitting in chair, mild respiratory distress however, no aphasia, follows simple commands.  Fully orientated, visual field full, no gaze palsy, facial symmetrical.  Bilateral upper and lower extremity equal strengths, no ataxia.  Sensation intact.  I detailed history taking, patient dysphagia seems has been going on for the last 6 to 12 months, not new for him, symptoms fluctuate from time to time, and worse 2 days  ago which he had core track placed.  However today modified barium study cleared him for dysphagia 2 and honey thick liquids.  Patient dysphagia cannot be explained by left cerebellum infarct.  However, patient left cerebellum infarct seems asymptomatic and could be related to recent cardiac procedure.  However, given his extensive cardiac history, occult A. fib cannot be ruled out although no A. fib recorded on telemetry monitoring during admission.  Would recommend 30-day CardioNet monitor as outpatient.  Currently he is on aspirin 324.  He has home medication on aspirin and Plavix.  Would recommend to resume Plavix if cleared by cardiology and CVTS services.  Continue Repatha at home for HLD management.  PT/OT recommend CIR.  Neurology will sign off. Please call with questions. Pt will follow up with stroke clinic NP at [facility] in about 4 weeks. Thanks for the consult.   Rosalin Hawking, MD PhD Stroke Neurology 10/21/2020 5:32 PM    To contact Stroke Continuity provider, please refer to [email or web address] After hours,  contact General Neurology

## 2020-10-21 NOTE — Progress Notes (Signed)
SLP Note:   Per RN, pt's respiratory status has been more stable today. Discussed MBS with Dr. Orvan Seen who is okay with pt leaving the floor for this testing today. MBS tentatively planned for early this afternoon (around 12:00) to better evaluate oropharyngeal swallowing.    Jonathan [name]., M.A. [place] Acute Environmental education officer [phone] Office [phone]

## 2020-10-21 NOTE — Progress Notes (Signed)
Patient ID: Jonathan [name], male   DOB: 1951-06-25, 70 y.o.   MRN: [id number] TCTS Evening Rounds:  Hemodynamically stable  sats 100% on 6L [place]  Good urine output  Had MBS today and rec Dysphagia 2 diet with honey thick liquids.

## 2020-10-21 NOTE — Progress Notes (Signed)
Modified Barium Swallow Progress Note  Patient Details  Name: Jonathan [name] MRN: [id number] Date of Birth: 08-Apr-1951  Today's Date: 10/21/2020  Modified Barium Swallow completed.  Full report located under Chart Review in the Imaging Section.  Brief recommendations include the following:  Clinical Impression  Pt noted to have decreased airway protection with thin liquids and nectar thick liquids (PAS 3) due to inappropriate timing for laryngeal closure and reduced hyolaryngeal movement. This appeared to be slightly improved with chin tuck technique for nectar thick liquids; however, pentration was still noted during several PO trials. When cued to cough, pt was unable to clear penetrates from airway and there was concern for potential aspiration while x-ray wasn't running. Pt's airway remained the most protected when consuming honey thick liquids and solids as his timing for laryngeal closure appeared more appropriate. Pt did demonstrate lingual pumping and oral residue with thicker consistencies. Residue was also noted in the vallecula potentially due to reduced hyolaryngeal movement and placement of cortrak resulting in reduced space for epiglottic movement and bolus transit. Coughing was noted following one PO trial which required Yankauer; however, the oropharynx remained clear so question potential backflow from the esophagus. Recomend DYS 2 diet and honey thick liquids given pt's respiratory status and increased risk for aspiration.  Swallow Evaluation Recommendations   Recommended Consults: Consider GI evaluation   SLP Diet Recommendations: Dysphagia 2 (Fine chop) solids;Honey thick liquids   Liquid Administration via: Cup   Medication Administration: Whole meds with puree   Supervision: Patient able to self feed;Full supervision/cueing for compensatory strategies   Compensations: Slow rate;Small sips/bites   Postural Changes: Remain semi-upright after after feeds/meals  (Comment);Seated upright at 90 degrees   Oral Care Recommendations: Oral care BID   Other Recommendations: Order thickener from pharmacy;Have oral suction available;Prohibited food (jello, ice cream, thin soups);Remove water pitcher    Jeanine Luz., SLP Student 10/21/2020,1:58 PM

## 2020-10-21 NOTE — Consult Note (Addendum)
Consultation Note Date: 10/21/2020   Patient Name: Jonathan [name]  DOB: 1951/03/28  MRN: [id number]  Age / Sex: 70 y.o., male  PCP: Leonard Downing, MD Referring Physician: [name], MD  Reason for Consultation: Establishing goals of care  HPI/Patient Profile: 70 y.o. male  with past medical history of CAD, myocardial infarction, hypertension, hyperlipidemia, COPD, tobacco abuse admitted on 10/14/2020 with severe CAD s/p CABG x 4 on 10/15/20. Hospitalization complicated by hypoxia/dyspnea, confusion, dysphagia with aspiration and found to have small age-indeterminate infarct in left posterior cerebellar hemisphere undergoing stroke work up.   Clinical Assessment and Goals of Care: I discussed with Jonathan [name] nurse who reports that there has been much discussion from patient and family regarding DNR status. Patient has repeatedly expressed wishes for DNR per RN and wife is aware.   I spoke today with Jonathan [name] and no family at bedside. He is able to speak with me but very short phrases and to the point. He expresses that he has pain and wants pain medication. He expresses that he is miserable and I asked him if he thinks he can hold on a little bit longer and his pain may begin to improve so he may feel better. He tells me "not much longer." I asked him how I can help him and support him and he shares "put me out of my misery." I expressed that I cannot do this and clarified his goals. He does express desire for full comfort care and desire to die. He confirms that he has expressed this desire to his wife and gives me permission to call and speak with her.   I called and spoke with wife, Santiago Glad. 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 He does not have very healthy coping mechanisms to deal with his acute illness. Santiago Glad also shares that Jonathan [name]' father had similar health issues and heart surgery and was left paralyzed and we fear that Jonathan [name] believes he will reach the same fate.   Santiago Glad agrees to respect her husband's wish of DNR so this was put in place. Otherwise she is concerned that he is struggling as he is without alcohol and cigarettes. We discussed consideration of putting antidepressant in place but she wishes to wait for this option. We did discuss that we will continue to encourage him but if he continues to desire comfort care and if his mindset continues to stay in a place of wanting to die then he will not be able to improve regardless of his reasoning for this desire. We agree to follow along and continue current interventions and if Jonathan [name] continues to consistently request comfort care we may have to consider this as an option for him. Santiago Glad understands.   All questions/concerns addressed. Emotional support  provided.   Primary Decision Maker PATIENT and wife    SUMMARY OF RECOMMENDATIONS   - DNR decided - Will need ongoing goals of care  Code Status/Advance Care Planning:  DNR   Symptom Management:   Recommend antidepressant.   Palliative Prophylaxis:   Aspiration, Bowel Regimen, Delirium Protocol, Frequent Pain Assessment, Oral Care and Turn Reposition  Psycho-social/Spiritual:   Desire for further Chaplaincy support:yes  Additional Recommendations: Caregiving  Support/Resources  Prognosis:    Overall prognosis guarded.   Discharge Planning: To Be Determined      Primary Diagnoses: Present on Admission: . Coronary artery disease involving native coronary artery of native heart with unstable angina pectoris ([place])   I have reviewed the medical record, interviewed the patient and family, and examined the patient. The following aspects are  pertinent.  Past Medical History:  Diagnosis Date  . Arthritis    hands   . Chronic bronchitis ([place])   . Chronic sinusitis of both maxillary sinuses 04/16/2018   Sinus CT 04/25/2018 Mild mucoperiosteal thickening involving the frontoethmoidal sinuses. Otherwise largely clear sinuses with no evidence for acute sinusitis.   [name] COPD GOLD II   active smoker 04/16/2018   Spirometry 04/16/2018  FEV1 1.43 (47%)  Ratio 62  s prior rx  - 04/16/2018    try symbicort 160 2bid - PFT's  06/08/2018  FEV1 2.03 (71 % ) ratio 61  p 5 % improvement from saba p nothing prior to study with DLCO  63 % corrects to 72  % for alv volume    -  06/08/2018  After extensive coaching inhaler device,  effectiveness =    75%  (short ti/ variable flow) > continue symbicort 160 2bid   . Coronary artery disease   . Coronary artery disease involving native coronary artery of native heart with unstable angina pectoris ([place])   . DOE (dyspnea on exertion) 04/16/2018  . Essential hypertension, benign 09/23/2012   Changed acei to arb 04/16/2018 due to pseudowheeze > improved 06/08/2018   . Hernia, inguinal 09/23/2012  . Hyperlipidemia    has not tolerated statins,  would not pursue PSK9 inhibitor as advised by Dr Claiborne Billings  . Hypertension   . Myocardial infarction ([place])   . NSTEMI (non-ST elevated myocardial infarction) ([place]) 09/23/2012  . 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 Tobacco abuse    Social History   Socioeconomic History  . Marital status: Married    Spouse name: Not on file  . Number of children: Not on file  . Years of education: Not on file  . Highest education level: Not on file  Occupational History  . Not on file  Tobacco Use  . Smoking status: Current Every Day Smoker    Packs/day: 1.00    Years: 53.00    Pack years: 53.00    Types: Cigarettes  . Smokeless tobacco: Never Used  Vaping Use  . Vaping Use: Never used  Substance and Sexual Activity  . Alcohol use: Yes    Alcohol/week: 5.0 standard drinks    Types: 5  Standard drinks or equivalent per week    Comment: consumes 1 pint per day   . Drug use: No  . Sexual activity: Yes  Other Topics Concern  . Not on file  Social History Narrative   Retired   Lives in [place]      Wishes to be DNR   Social Determinants of Radio broadcast assistant Strain: Not on [employer] Insecurity: Not  on file  Transportation Needs: Not on file  Physical Activity: Not on file  Stress: Not on file  Social Connections: Not on file   Family History  Problem Relation Age of Onset  . CAD Father        CABG with subsequent stroke   Scheduled Meds: . acetaminophen (TYLENOL) oral liquid 160 mg/5 mL  650 mg Per Tube Q6H  . aspirin  324 mg Per Tube Daily  . bisacodyl  10 mg Oral Daily   Or  . bisacodyl  10 mg Rectal Daily  . Chlorhexidine Gluconate Cloth  6 each Topical Daily  . colchicine  0.6 mg Per Tube BID  . feeding supplement (PROSource TF)  45 mL Per Tube QID  . furosemide  40 mg Intravenous BID  . guaiFENesin  200 mg Per Tube Q6H  . insulin aspart  0-24 Units Subcutaneous Q4H  . mouth rinse  15 mL Mouth Rinse BID  . metoprolol tartrate  12.5 mg Per Tube BID  . nicotine  21 mg Transdermal Daily  . pantoprazole sodium  40 mg Per Tube Daily  . sodium chloride flush  10-40 mL Intracatheter Q12H  . sodium chloride flush  10-40 mL Intracatheter Q12H  . sodium chloride flush  3 mL Intravenous Q12H   Continuous Infusions: . sodium chloride 20 mL/hr at 10/17/20 1728  . sodium chloride    . sodium chloride 20 mL/hr at 10/15/20 1315  . sodium chloride    . feeding supplement (VITAL 1.5 CAL) 1,000 mL (10/20/20 2123)  . lactated ringers    . lactated ringers    . lactated ringers Stopped (10/18/20 0849)   PRN Meds:.sodium chloride, Place/Maintain arterial line **AND** sodium chloride, ipratropium-albuterol, lactated ringers, metoprolol tartrate, ondansetron (ZOFRAN) IV, oxyCODONE, sodium chloride flush, sodium chloride flush, sodium chloride  flush, traMADol Allergies  Allergen Reactions  . Niaspan [Niacin Er] Itching and Other (See Comments)    insomnia  . Statins     LFT elevavation, CK elevevation   Review of Systems  Constitutional: Positive for activity change, appetite change and fatigue.  Cardiovascular: Positive for chest pain.  Neurological: Positive for weakness.    Physical Exam Vitals and nursing note reviewed.  Constitutional:      General: He is not in acute distress.    Appearance: He is ill-appearing.  Cardiovascular:     Rate and Rhythm: Normal rate.  Pulmonary:     Effort: Pulmonary effort is normal. No tachypnea, accessory muscle usage or respiratory distress.  Abdominal:     Palpations: Abdomen is soft.  Neurological:     Mental Status: He is alert and oriented to person, place, and time.     Vital Signs: BP (!) 109/57   Pulse 67   Temp 98.1 F (36.7 C) (Oral)   Resp 10   Ht 5\' 8"  (1.727 m)   Wt 104.2 kg   SpO2 100%   BMI 34.93 kg/m  Pain Scale: 0-10 POSS *See Group Information*: 1-Acceptable,Awake and alert Pain Score: 4    SpO2: SpO2: 100 % O2 Device:SpO2: 100 % O2 Flow Rate: .O2 Flow Rate (L/min): 6 L/min  IO: Intake/output summary:   Intake/Output Summary (Last 24 hours) at 10/21/2020 1447 Last data filed at 10/21/2020 1400 Gross per 24 hour  Intake 636.17 ml  Output 3160 ml  Net -2523.83 ml    LBM: Last BM Date: 10/13/20 Baseline Weight: Weight: 99.8 kg Most recent weight: Weight: 104.2 kg     Palliative Assessment/Data:  Time In: 1610 Time Out: 1700 Time Total: 50 min Greater than 50%  of this time was spent counseling and coordinating care related to the above assessment and plan.  Signed by: Vinie Sill, NP Palliative Medicine Team Pager # [phone] (M-F 8a-5p) Team Phone # [phone] (Nights/Weekends)

## 2020-10-22 ENCOUNTER — Inpatient Hospital Stay (HOSPITAL_COMMUNITY): Payer: Medicare Other

## 2020-10-22 DIAGNOSIS — Z951 Presence of aortocoronary bypass graft: Secondary | ICD-10-CM | POA: Diagnosis not present

## 2020-10-22 DIAGNOSIS — I63442 Cerebral infarction due to embolism of left cerebellar artery: Secondary | ICD-10-CM | POA: Diagnosis not present

## 2020-10-22 DIAGNOSIS — Z515 Encounter for palliative care: Secondary | ICD-10-CM | POA: Diagnosis not present

## 2020-10-22 DIAGNOSIS — Z7189 Other specified counseling: Secondary | ICD-10-CM | POA: Diagnosis not present

## 2020-10-22 LAB — BASIC METABOLIC PANEL
Anion gap: 11 (ref 5–15)
BUN: 27 mg/dL — ABNORMAL HIGH (ref 8–23)
CO2: 37 mmol/L — ABNORMAL HIGH (ref 22–32)
Calcium: 8.1 mg/dL — ABNORMAL LOW (ref 8.9–10.3)
Chloride: 98 mmol/L (ref 98–111)
Creatinine, Ser: 1.01 mg/dL (ref 0.61–1.24)
GFR, Estimated: >60 mL/min (ref >60)
Glucose, Bld: 102 mg/dL — ABNORMAL HIGH (ref 70–99)
Potassium: 3.7 mmol/L (ref 3.5–5.1)
Sodium: 146 mmol/L — ABNORMAL HIGH (ref 135–145)

## 2020-10-22 LAB — CBC
HCT: 33.7 % — ABNORMAL LOW (ref 39.0–52.0)
Hemoglobin: 10.3 g/dL — ABNORMAL LOW (ref 13.0–17.0)
MCH: 32.3 pg (ref 26.0–34.0)
MCHC: 30.6 g/dL (ref 30.0–36.0)
MCV: 105.6 fL — ABNORMAL HIGH (ref 80.0–100.0)
Platelets: 230 10*3/uL (ref 150–400)
RBC: 3.19 MIL/uL — ABNORMAL LOW (ref 4.22–5.81)
RDW: 13.2 % (ref 11.5–15.5)
WBC: 11.8 10*3/uL — ABNORMAL HIGH (ref 4.0–10.5)
nRBC: 0 % (ref 0.0–0.2)

## 2020-10-22 LAB — GLUCOSE, CAPILLARY
Glucose-Capillary: 89 mg/dL (ref 70–99)
Glucose-Capillary: 74 mg/dL (ref 70–99)
Glucose-Capillary: 94 mg/dL (ref 70–99)
Glucose-Capillary: 99 mg/dL (ref 70–99)

## 2020-10-22 IMAGING — DX DG CHEST 1V
1 series · 1 of 1 positions shown · non-contrast
Comparison: [DATE]

CLINICAL DATA: Chest pain

EXAM:
CHEST  1 VIEW

[chest ap]
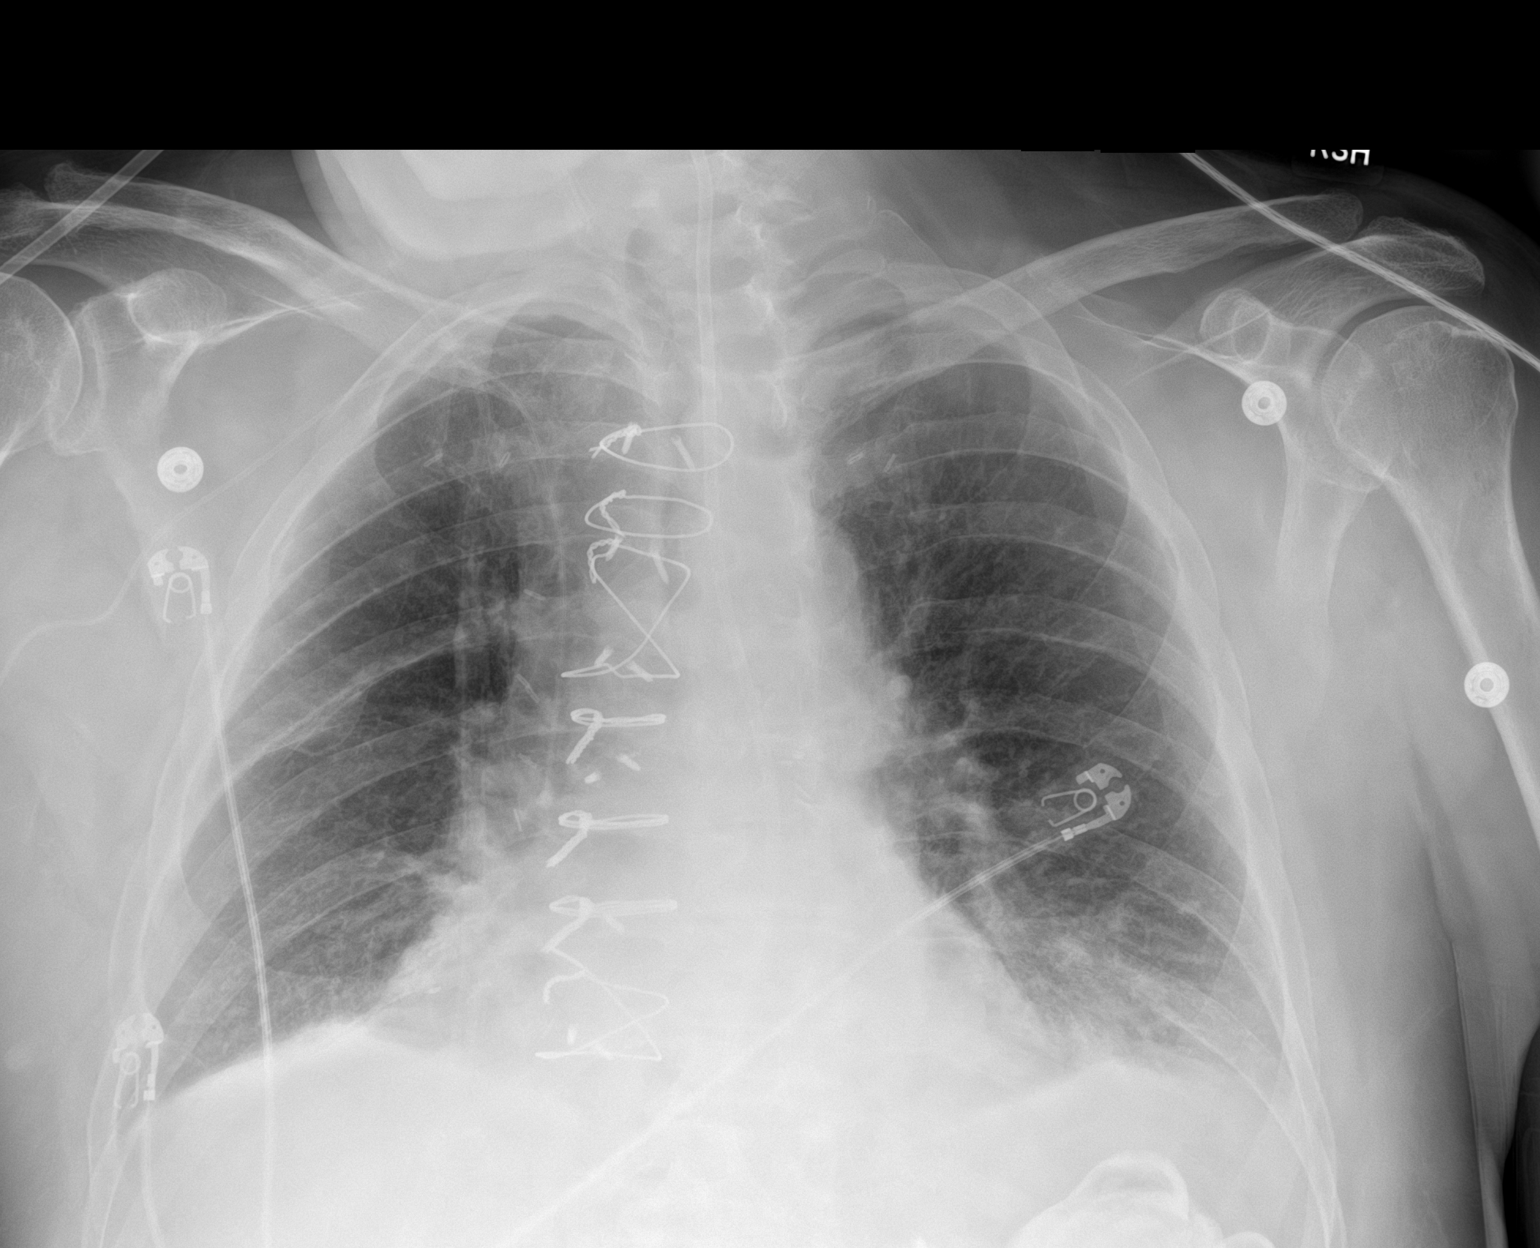

[1 of 1 positions shown; findings below may reference images not displayed]

FINDINGS: Enteric tube tip is below the diaphragm. Central catheter tip is at
the cavoatrial junction. No pneumothorax. There is atelectatic
change in the bases. There is an equivocal left pleural effusion.
Heart size and pulmonary vascularity within normal limits. Status
post coronary artery bypass grafting. No adenopathy. No bone
lesions.
IMPRESSION: Tube and catheter positions as described without pneumothorax.
Bibasilar atelectasis with equivocal left pleural effusion. Stable
cardiac silhouette.

## 2020-10-22 MED ORDER — ASPIRIN 81 MG PO CHEW
81.0000 mg | CHEWABLE_TABLET | Freq: Every day | ORAL | Status: DC
  Administered 2020-10-22: 81 mg via ORAL
  Filled 2020-10-22: qty 1

## 2020-10-22 MED ORDER — CLOPIDOGREL BISULFATE 75 MG PO TABS
75.0000 mg | ORAL_TABLET | Freq: Every day | ORAL | Status: DC
  Filled 2020-10-22: qty 1

## 2020-10-22 MED ORDER — ASPIRIN 81 MG PO CHEW
81.0000 mg | CHEWABLE_TABLET | Freq: Every day | ORAL | Status: DC
  Administered 2020-10-23 – 2020-10-25 (×3): 81 mg
  Filled 2020-10-22 (×3): qty 1

## 2020-10-22 MED ORDER — METOPROLOL TARTRATE 25 MG PO TABS
25.0000 mg | ORAL_TABLET | Freq: Two times a day (BID) | ORAL | Status: DC
  Administered 2020-10-22 – 2020-10-25 (×6): 25 mg
  Filled 2020-10-22 (×6): qty 1

## 2020-10-22 MED ORDER — CLOPIDOGREL BISULFATE 75 MG PO TABS
75.0000 mg | ORAL_TABLET | Freq: Every day | ORAL | Status: DC
  Administered 2020-10-22 – 2020-10-25 (×4): 75 mg
  Filled 2020-10-22 (×3): qty 1

## 2020-10-22 MED ORDER — POTASSIUM CHLORIDE 20 MEQ PO PACK
20.0000 meq | PACK | ORAL | Status: AC
  Administered 2020-10-22 (×3): 20 meq
  Filled 2020-10-22 (×4): qty 1

## 2020-10-22 NOTE — PMR Pre-admission (Signed)
PMR Admission Coordinator Pre-Admission Assessment  Patient: Jonathan [name] is an 70 y.o., male MRN: [id number] DOB: [birth year] Height: 5\' 8"  (172.7 cm) Weight: 100 kg              Insurance Information HMO:     PPO:      PCP:      IPA:      80/20:      OTHER:  PRIMARY: [place] [employer]      Policy#: [id number]      Subscriber: pt CM Name:      Phone#: [phone] option #3     Fax#: [phone] Pre-Cert#: [id number]. [employer] reference number: [id number].  Approval received 2/11 for 7 days beginning 2/11-2/17. Confirmed approval with Rose.      Employer:  Benefits:  Phone #: [phone]     Name: 2/10 Eff. Date: 09/12/2020     Deduct: none      Out of Pocket Max: $4500      Life Max: none  CIR: $325 co pay per day days 1 until 5      SNF: no copay days 1 until 20; $188 copay per day day 21 until 44; no copay days 45 until 100 Outpatient: $30 per visit     Co-Pay: visits per medical neccesity Home Health: 100%      Co-Pay: visits per medical neccesity DME: 80%     Co-Pay: 20% Providers: in network  SECONDARY: none  Financial Counselor:       Phone#:   [employer]" for patients in Inpatient Rehabilitation Facilities with attached "Privacy Act [place] Records" was provided and verbally reviewed with: Patient and Family  Emergency Contact Information Contact Information    Name Relation Home Work [place] Spouse [phone]     Current Medical History  Patient Admitting Diagnosis: CVA s/p CABG  History of Present Illness:69 y.o. male with history of HTN, COPD, CAD--had refused CABG last year but continued to decline with progressive weakness, DOE and BLE pain. He reports that he could not walk household distances without SOB or CP. [name]  He was admitted on 10/14/2020 for cath revealing severe CAD with Stent restenosis and underwent CABG x 4 by Dr. Julien Girt on 10/15/2020.  Post op fluid overload treated  with diuresis. He had issues with confusion as well as productive cough with hypoxia/DOE with minimal activity.  Nurse reports that patient had decline in mental status with difficulty swallowing yesterday which precipitated CT head.  CT head revealed age indeterminate left posterior cerebellar acute/subacute infarct--neurology recommended stroke work-up. MRA of brain performed and showed patchy small volume early subacute ischemic infarcts involving left cerebellar hemisphere. 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   BSS done revealing increased WOB with po trials and odynophagia--NPO was recommended and cortrak placed for supplemental feeding.  MBS performed on 2/9 and pt was cleared to begin dysphagia 2/honey thick liquid diet. Therapy evaluations completed and CIR recommended d/t pt's deficits in swallow function, functional mobility and ADLs.        Glasgow Coma Scale Score: 15  Past Medical History  Past Medical History:  Diagnosis Date  . Arthritis    hands   . Chronic bronchitis ([place])   . Chronic sinusitis of both maxillary sinuses 04/16/2018   Sinus CT 04/25/2018 Mild mucoperiosteal thickening involving the frontoethmoidal sinuses. Otherwise largely clear sinuses with no evidence for acute sinusitis.   [name] COPD GOLD II  active smoker 04/16/2018   Spirometry 04/16/2018  FEV1 1.43 (47%)  Ratio 62  s prior rx  - 04/16/2018    try symbicort 160 2bid - PFT's  06/08/2018  FEV1 2.03 (71 % ) ratio 61  p 5 % improvement from saba p nothing prior to study with DLCO  63 % corrects to 72  % for alv volume    -  06/08/2018  After extensive coaching inhaler device,  effectiveness =    75%  (short ti/ variable flow) > continue symbicort 160 2bid   . Coronary artery disease   . Coronary artery disease involving native coronary artery of native heart with unstable angina pectoris ([place])   . DOE (dyspnea on exertion) 04/16/2018  . Essential hypertension, benign 09/23/2012   Changed acei to arb 04/16/2018 due to  pseudowheeze > improved 06/08/2018   . Hernia, inguinal 09/23/2012  . Hyperlipidemia    has not tolerated statins,  would not pursue PSK9 inhibitor as advised by Dr Claiborne Billings  . Hypertension   . Myocardial infarction ([place])   . NSTEMI (non-ST elevated myocardial infarction) ([place]) 09/23/2012  . 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 Tobacco abuse     Family History  family history includes CAD in his father.  Prior Rehab/Hospitalizations:  Has the patient had prior rehab or hospitalizations prior to admission? Yes  Has the patient had major surgery during 100 days prior to admission? Yes  Current Medications   Current Facility-Administered Medications:  .  aspirin chewable tablet 81 mg, 81 mg, Per Tube, Daily, [name], MD, 81 mg at 10/25/20 0827 .  bisacodyl (DULCOLAX) EC tablet 10 mg, 10 mg, Oral, Daily, 10 mg at 10/24/20 0906 **OR** bisacodyl (DULCOLAX) suppository 10 mg, 10 mg, Rectal, Daily, Atkins, Glenice Bow, MD, 10 mg at 10/22/20 1050 .  Chlorhexidine Gluconate Cloth 2 % PADS 6 each, 6 each, Topical, Daily, [name], MD, 6 each at 10/24/20 1123 .  clopidogrel (PLAVIX) tablet 75 mg, 75 mg, Per Tube, Daily, Orvan Seen, Glenice Bow, MD, 75 mg at 10/25/20 0827 .  colchicine tablet 0.6 mg, 0.6 mg, Per Tube, BID, Orvan Seen, Glenice Bow, MD, 0.6 mg at 10/25/20 0827 .  feeding supplement (PROSource TF) liquid 45 mL, 45 mL, Per Tube, QID, Atkins, Glenice Bow, MD, 45 mL at 10/25/20 0826 .  feeding supplement (VITAL 1.5 CAL) liquid 1,000 mL, 1,000 mL, Per Tube, Q24H, Rexene Alberts, MD, Stopped at 10/25/20 [phone] .  furosemide (LASIX) injection 40 mg, 40 mg, Intravenous, BID, Orvan Seen, Glenice Bow, MD, 40 mg at 10/25/20 0827 .  guaiFENesin tablet 200 mg, 200 mg, Per Tube, Q6H, Atkins, Glenice Bow, MD, 200 mg at 10/25/20 0615 .  insulin aspart (novoLOG) injection 0-24 Units, 0-24 Units, Subcutaneous, Q4H, Orvan Seen, Glenice Bow, MD, 2 Units at 10/21/20 1129 .  ipratropium-albuterol (DUONEB) 0.5-2.5 (3)  MG/3ML nebulizer solution 3 mL, 3 mL, Nebulization, Q6H PRN, Orvan Seen, Glenice Bow, MD, 3 mL at 10/22/20 1727 .  lactated ringers infusion 500 mL, 500 mL, Intravenous, Once PRN, Atkins, Glenice Bow, MD .  lactated ringers infusion, , Intravenous, Continuous, Orvan Seen, Glenice Bow, MD, Stopped at 10/18/20 0849 .  MEDLINE mouth rinse, 15 mL, Mouth Rinse, BID, Atkins, Glenice Bow, MD, 15 mL at 10/25/20 0843 .  metoprolol tartrate (LOPRESSOR) injection 2.5-5 mg, 2.5-5 mg, Intravenous, Q2H PRN, Atkins, Glenice Bow, MD .  metoprolol tartrate (LOPRESSOR) tablet 25 mg, 25 mg, Per Tube, BID, 25 mg at 10/25/20 0827 **OR** [DISCONTINUED] metoprolol tartrate (LOPRESSOR)  25 mg/10 mL oral suspension 12.5 mg, 12.5 mg, Per Tube, BID, Conte, Tessa N, PA-C, 12.5 mg at 10/16/20 2155 .  nicotine (NICODERM CQ - dosed in mg/24 hours) patch 21 mg, 21 mg, Transdermal, Daily, Orvan Seen, Glenice Bow, MD, 21 mg at 10/25/20 0843 .  ondansetron (ZOFRAN) injection 4 mg, 4 mg, Intravenous, Q6H PRN, [name], MD, 4 mg at 10/19/20 5726 .  oxyCODONE (Oxy IR/ROXICODONE) immediate release tablet 5-10 mg, 5-10 mg, Oral, Q3H PRN, [name], MD, 5 mg at 10/23/20 0832 .  pantoprazole sodium (PROTONIX) 40 mg/20 mL oral suspension 40 mg, 40 mg, Per Tube, Daily, Orvan Seen, Glenice Bow, MD, 40 mg at 10/25/20 0827 .  phenol (CHLORASEPTIC) mouth spray 1 spray, 1 spray, Mouth/Throat, PRN, Atkins, Broadus Z, MD .  sodium chloride flush (NS) 0.9 % injection 10-40 mL, 10-40 mL, Intracatheter, Q12H, Atkins, Glenice Bow, MD, 20 mL at 10/25/20 0847 .  sodium chloride flush (NS) 0.9 % injection 3 mL, 3 mL, Intravenous, Q12H, Atkins, Glenice Bow, MD, 3 mL at 10/24/20 0912 .  traMADol (ULTRAM) tablet 50-100 mg, 50-100 mg, Oral, Q4H PRN, [name], MD, 100 mg at 10/22/20 1641  Patients Current Diet:  Diet Order            DIET DYS 2 Room service appropriate? Yes; Fluid consistency: Honey Thick  Diet effective now                 Precautions /  Restrictions Precautions Precautions: Fall,Sternal Restrictions Weight Bearing Restrictions: Yes (Sternal Precautions) RUE Weight Bearing: Weight bearing as tolerated LLE Weight Bearing: Weight bearing as tolerated Other Position/Activity Restrictions: pushing through his knees   Has the patient had 2 or more falls or a fall with injury in the past year?No  Prior Activity Level Community (5-7x/wk): independent  Prior Functional Level Prior Function Level of Independence: Independent  Self Care: Did the patient need help bathing, dressing, using the toilet or eating?  Independent  Indoor Mobility: Did the patient need assistance with walking from room to room (with or without device)? Independent  Stairs: Did the patient need assistance with internal or external stairs (with or without device)? Independent  Functional Cognition: Did the patient need help planning regular tasks such as shopping or remembering to take medications? Independent  Home Assistive Devices / [place] Devices/Equipment: Eyeglasses Home Equipment: Cane - single point,Crutches  Prior Device Use: Indicate devices/aids used by the patient prior to current illness, exacerbation or injury? cane  Current Functional Level Cognition  Overall Cognitive Status: Within Functional Limits for tasks assessed Orientation Level: Oriented X4    Extremity Assessment (includes Sensation/Coordination)  Upper Extremity Assessment: Overall WFL for tasks assessed  Lower Extremity Assessment: Defer to PT evaluation LLE Deficits / Details: generally WFL, but not as quick and coordinated to respond to commands.    ADLs  Overall ADL's : Needs assistance/impaired Eating/Feeding: Independent,Sitting Eating/Feeding Details (indicate cue type and reason): Dys 2 with honey thick liquids Grooming: Wash/dry hands,Wash/dry face,Oral care,Sitting,Set up Upper Body Dressing : Moderate assistance,Sitting Lower Body  Dressing: Moderate assistance,Sit to/from stand Functional mobility during ADLs: Min guard    Mobility  Overal bed mobility: Needs Assistance Bed Mobility: Rolling,Supine to Sit Rolling: Min guard Supine to sit: Min assist Sit to supine: Mod assist General bed mobility comments: OOB in chair    Transfers  Overall transfer level: Needs assistance Transfers: Sit to/from Stand Sit to Stand: Min assist Stand pivot transfers: Min guard General  transfer comment: modA and increased time for power up to standing,    Ambulation / Gait / Stairs / Wheelchair Mobility  Ambulation/Gait Ambulation/Gait assistance: Herbalist (Feet): 150 Feet Assistive device: Rolling walker (2 wheeled) (EVA walker) Gait Pattern/deviations: Step-through pattern,Decreased step length - right,Decreased step length - left,Shuffle General Gait Details: min A for steadying with EVA walker, pt with 1x standing rest break half way through due to increased WoB Gait velocity: slower Gait velocity interpretation: <1.8 ft/sec, indicate of risk for recurrent falls    Posture / Balance Balance Overall balance assessment: Mild deficits observed, not formally tested    Special needs/care consideration Designated visitor is wife, Santiago Glad 43 inch 10 Fr cortrak placed      Previous Home Environment  Living Arrangements: Spouse/significant other  Lives With: Spouse Available Help at Discharge: Family,Available 24 hours/day Type of Home: House Home Layout: One level Home Access: Stairs to enter Entrance Stairs-Rails: Surveyor, mining of Steps: couple Bathroom Shower/Tub: Chiropodist:  (comfort leve) Bathroom Accessibility: Yes How Accessible: Accessible via walker [place]: No  Discharge Living Setting Plans for Discharge Living Setting: Patient's home,Lives with (comment) (wife) Type of Home at Discharge: House Discharge Home Layout: One level Discharge  Home Access: Stairs to enter Entrance Stairs-Rails: Surveyor, mining of Steps: couple Discharge Bathroom Shower/Tub: Tub/shower unit Discharge Bathroom Toilet: Standard Discharge Bathroom Accessibility: Yes How Accessible: Accessible via walker Does the patient have any problems obtaining your medications?: No  Social/Family/Support Systems Contact Information: wife, Santiago Glad Anticipated Caregiver: wife Ability/Limitations of Caregiver: supervision level Caregiver Availability: 24/7 Discharge Plan Discussed with Primary Caregiver: Yes Is Caregiver In Agreement with Plan?: Yes Does Caregiver/Family have Issues with Lodging/Transportation while Pt is in Rehab?: No  Goals Patient/Family Goal for Rehab: supervision PT, OT, and SLP Expected length of stay: ELOS 2 weeks Pt/Family Agrees to Admission and willing to participate: Yes Program Orientation Provided & Reviewed with Pt/Caregiver Including Roles  & Responsibilities: Yes  Decrease burden of Care through IP rehab admission: n/a  Possible need for SNF placement upon discharge:not anticipated  Patient Condition: This patient's medical and functional status has changed since the consult dated: 10/21/2020 in which the Rehabilitation Physician determined and documented that the patient's condition is appropriate for intensive rehabilitative care in an inpatient rehabilitation facility. See "History of Present Illness" (above) for medical update. Functional changes are: Pt is currently Mod A with transfers, Min A 150' with gait; Min A-Min G with ADLs . Patient's medical and functional status update has been discussed with the Rehabilitation physician and patient remains appropriate for inpatient rehabilitation. Will admit to inpatient rehab today.  Preadmission Screen Completed By: Danne Baxter RN MSN with day of admission updates by Bethel Born, CCC-SLP, 10/25/2020 8:56  AM ______________________________________________________________________   Discussed status with Dr. Naaman Plummer on 10/25/20  at 8:56 AM and received approval for admission today.  Admission Coordinator: Danne Baxter RN MSN with day of admission updates by Bethel Born, time 8:56 AM Sudie Grumbling 10/25/20

## 2020-10-22 NOTE — Progress Notes (Signed)
  Speech Language Pathology Treatment: Dysphagia  Patient Details Name: Jonathan [name] MRN: [id number] DOB: Aug 06, 1951 Today's Date: 10/22/2020 Time: 0852-0908 SLP Time Calculation (min) (ACUTE ONLY): 16 min  Assessment / Plan / Recommendation Clinical Impression  Pt benefited from Min verbal cues and encouragment to participate in PO trials due to hesitancy that anything he tries will come back up. No overt s/s of aspiration were noted during trials; however, PO intake of solids was minimal and mainly consisted of very small bites. Pt benefited from [place] verbal cues for smaller sips of honey thick liquids as he reported concern that consistencies were getting "stuck." This appeared to be alleviated with cues for smaller sips. Provided education on aspiration precautions and diet recommendations especially given that pt was using thin liquids and swab to moisten his mouth while eating. Recommend continuation of DYS 2 diet and honey thick liquids. Given pt's respiratory status, dicussed with pt to take breaks while eating if he feels SOB.    HPI HPI: Pt is a 70 y.o. male presenting with coronary artery disease. Pt recieved coronary artery bypass grafting times four on 10/16/20. 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 Pt intubated from 10/15/20 to 10/17/20. PMH includes: COPD, hyperlipidemia, HTN, DOE, chronic bronchitis, chronic sinusitis, myocardial infarction.      SLP Plan  Continue with current plan of care       Recommendations  Diet recommendations: Dysphagia 2 (fine chop);Honey-thick liquid Liquids provided via: Cup Medication Administration: Whole meds with puree Supervision: Patient able to self feed;Intermittent supervision to cue for compensatory strategies Compensations: Slow rate;Small sips/bites Postural Changes and/or Swallow Maneuvers: Seated  upright 90 degrees                Oral Care Recommendations: Oral care BID Follow up Recommendations: Home health SLP SLP Visit Diagnosis: Dysphagia, oropharyngeal phase (R13.12) Plan: Continue with current plan of care       GO               Jeanine Luz., SLP Student 10/22/2020, 10:52 AM

## 2020-10-22 NOTE — Progress Notes (Signed)
Physical Therapy Treatment Patient Details Name: Jonathan [name] MRN: [id number] DOB: 02-22-1951 Today's Date: 10/22/2020    History of Present Illness : Pt is a 70 y.o. male presenting with coronary artery disease. Pt recieved coronary artery bypass grafting times four on 10/16/20. 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 Pt intubated from 10/15/20 to 10/17/20. PMH includes: COPD, hyperlipidemia, HTN, DOE, chronic bronchitis, chronic sinusitis, myocardial infarction    PT Comments    Pt requesting to urinate on entry, despite [place] in place requests to stand for comfort. Pt requires modA for coming to standing while holding heart pillow. Once finished with urination pt able to ambulate 150 feet with EVA walker and min A for steadying. D/c plans remain appropriate at this time. PT will continue to see pt acutely to progress mobility.     Follow Up Recommendations  Supervision/Assistance - 24 hour;CIR     Equipment Recommendations  Other (comment) (TBD)       Precautions / Restrictions Precautions Precautions: Fall;Sternal Restrictions Weight Bearing Restrictions: Yes RUE Weight Bearing: Non weight bearing LLE Weight Bearing: Non weight bearing    Mobility  Bed Mobility               General bed mobility comments: OOB in chair    Transfers Overall transfer level: Needs assistance   Transfers: Sit to/from Stand Sit to Stand: Mod assist         General transfer comment: modA and increased time for power up to standing,  Ambulation/Gait Ambulation/Gait assistance: Min assist Gait Distance (Feet): 150 Feet Assistive device: Rolling walker (2 wheeled) (EVA walker) Gait Pattern/deviations: Step-through pattern;Decreased step length - right;Decreased step length - left;Shuffle Gait velocity: slower Gait velocity interpretation: <1.8  ft/sec, indicate of risk for recurrent falls General Gait Details: min A for steadying with EVA walker, pt with 1x standing rest break half way through due to increased WoB       Balance Overall balance assessment: Mild deficits observed, not formally tested                                          Cognition Arousal/Alertness: Awake/alert Behavior During Therapy: WFL for tasks assessed/performed Overall Cognitive Status: Within Functional Limits for tasks assessed                                           General Comments General comments (skin integrity, edema, etc.): SaO2 on 3 L O2 96%O2 at rest, dropped to 85%O2 with poor pleth wave with ambulation, at end of ambulation with good pleth wave SaO2 100%O2. max HR noted with ambulation 146 bpm      Pertinent Vitals/Pain Pain Assessment: Faces Faces Pain Scale: Hurts even more Pain Location: old hernia standing, throat, chest Pain Descriptors / Indicators: Discomfort;Grimacing;Guarding;Sharp Pain Intervention(s): Limited activity within patient's tolerance;Monitored during session;Repositioned    Home Living Family/patient expects to be discharged to:: Private residence Living Arrangements: Spouse/significant other Available Help at Discharge: Family;Available 24 hours/day Type of Home: House Home Access: Stairs to enter Entrance Stairs-Rails: Right;Left Home Layout: One level Home Equipment: Cane - single point;Crutches      Prior Function Level of Independence: Independent  PT Goals (current goals can now be found in the care plan section) Acute Rehab PT Goals Patient Stated Goal: back independent PT Goal Formulation: With patient Time For Goal Achievement: 11/03/20 Potential to Achieve Goals: Good Progress towards PT goals: Progressing toward goals    Frequency    Min 3X/week      PT Plan Current plan remains appropriate       AM-PAC PT "6 Clicks" Mobility    Outcome Measure  Help needed turning from your back to your side while in a flat bed without using bedrails?: A Lot Help needed moving from lying on your back to sitting on the side of a flat bed without using bedrails?: A Lot Help needed moving to and from a bed to a chair (including a wheelchair)?: A Little Help needed standing up from a chair using your arms (e.g., wheelchair or bedside chair)?: A Little Help needed to walk in hospital room?: A Lot Help needed climbing 3-5 steps with a railing? : A Lot 6 Click Score: 14    End of Session Equipment Utilized During Treatment: Oxygen Activity Tolerance: Patient tolerated treatment well Patient left: with call bell/phone within reach;in chair Nurse Communication: Mobility status PT Visit Diagnosis: Unsteadiness on feet (R26.81);Other abnormalities of gait and mobility (R26.89);Difficulty in walking, not elsewhere classified (R26.2);Pain Pain - part of body:  (sternal, hx hernia site)     Time: 1165-7903 PT Time Calculation (min) (ACUTE ONLY): 26 min  Charges:  $Gait Training: 8-22 mins $Therapeutic Activity: 8-22 mins                     Ramez Arrona B. Migdalia Dk PT, DPT Acute Rehabilitation Services Pager [phone] Office [phone]    [place] 10/22/2020, 11:09 AM

## 2020-10-22 NOTE — Progress Notes (Signed)
Palliative:  HPI: 70 y.o. male  with past medical history of CAD, myocardial infarction, hypertension, hyperlipidemia, COPD, tobacco abuse admitted on 10/14/2020 with severe CAD s/p CABG x 4 on 10/15/20. Hospitalization complicated by hypoxia/dyspnea, confusion, dysphagia with aspiration and found to have small age-indeterminate infarct in left posterior cerebellar hemisphere undergoing stroke work up.   I met today again with [name]. He is sitting up in recliner and continues to be alert and oriented. He recalls our conversations from yesterday. I told him that I spoke with his wife and she is requesting that he continues to try and push on a little bit longer and see if he can have some more improvement. He tells me that he is willing to continue on with current interventions for now but he is not sure how much longer he can do this. We discussed that there could potentially be a time in the future when he might actually be glad that he did this but he is doubtful. He shares "this has been hell."   He agrees to continue with current care interventions. He is pleased that DNR is in place. We discussed reconvening in the near future to regroup and re-evaluate his goals of care to see if his wishes change. His wife was open to ongoing goals of care discussions as well.   All questions/concerns addressed. Emotional support provided.   Exam: Alert, oriented. Sitting up in recliner. No distress. Overall fatigue and discomfort. Breathing regular, unlabored. Abd soft. Moves all extremities.   Plan: - Goals clear for now.  - DNR in place.  - Continue current interventions for now but wishes to re-evaluate goals of care in a few days.   [place], NP Palliative Medicine Team Pager [phone] (Please see [email or web address] for schedule) Team Phone [phone]    Greater than 50%  of this time was spent counseling and coordinating care related to the above assessment and plan

## 2020-10-22 NOTE — Progress Notes (Addendum)
Inpatient Rehabilitation Admissions Coordinator  Met with patient at bedside for rehab assessment. . No family present. I spoke with wife by phone after she discussed with patient today his rehab needs. Patient states yes, he would like to pursue more rehab to be able to return home with his wife. I will begin insurance approval with [facility] for possible admit early next week pending insurance approval.  Danne Baxter, RN, MSN Rehab Admissions Coordinator [phone] 10/22/2020 4:38 PM

## 2020-10-22 NOTE — Plan of Care (Signed)
  Problem: Education: Goal: Knowledge of General Education information will improve Description Including pain rating scale, medication(s)/side effects and non-pharmacologic comfort measures Outcome: Progressing   Problem: Health Behavior/Discharge Planning: Goal: Ability to manage health-related needs will improve Outcome: Progressing   

## 2020-10-22 NOTE — Progress Notes (Signed)
7 Days Post-Op Procedure(s) (LRB): CORONARY ARTERY BYPASS GRAFTING (CABG) TIMES FOUR USING BILATERAL INTERNAL MAMMARY ARTERIES AND ENDOSCOPICALLY HARVESTED GREATER SAPHENOUS VEIN AND INSERTION OF 50CC INTRAAORTIC BALLOON PUMP (N/A) TRANSESOPHAGEAL ECHOCARDIOGRAM (TEE) (N/A) INDOCYANINE GREEN FLUORESCENCE IMAGING (ICG) (N/A) ENDOVEIN HARVEST OF GREATER SAPHENOUS VEIN (Right) Subjective: No specific complaints  Objective: Vital signs in last 24 hours: Temp:  [97.7 F (36.5 C)-98.9 F (37.2 C)] 98 F (36.7 C) (02/10 1527) Pulse Rate:  [69-101] 75 (02/10 1300) Cardiac Rhythm: Normal sinus rhythm (02/10 1200) Resp:  [8-33] 15 (02/10 1300) BP: (103-157)/(49-85) 106/57 (02/10 1300) SpO2:  [88 %-100 %] 97 % (02/10 1728) Weight:  [103.1 kg] 103.1 kg (02/10 0523)  Hemodynamic parameters for last 24 hours:    Intake/Output from previous day: 02/09 0701 - 02/10 0700 In: 1440 [NG/GT:1440] Out: 3540 [Urine:3540] Intake/Output this shift: Total I/O In: 660 [NG/GT:660] Out: 1220 [Urine:1220]  General appearance: alert Neurologic: intact Heart: regular rate and rhythm, S1, S2 normal, no murmur, click, rub or gallop Lungs: clear to auscultation bilaterally Abdomen: soft, non-tender; bowel sounds normal; no masses,  no organomegaly Extremities: extremities normal, atraumatic, no cyanosis or edema  Lab Results: Recent Labs    10/20/20 0432 10/22/20 0314  WBC 9.8 11.8*  HGB 11.3* 10.3*  HCT 34.2* 33.7*  PLT 135* 230   BMET:  Recent Labs    10/21/20 0326 10/22/20 0314  NA 144 146*  K 3.6 3.7  CL 99 98  CO2 35* 37*  GLUCOSE 111* 102*  BUN 28* 27*  CREATININE 1.06 1.01  CALCIUM 7.8* 8.1*    PT/INR: No results for input(s): LABPROT, INR in the last 72 hours. ABG    Component Value Date/Time   PHART 7.287 (L) 10/16/2020 1702   HCO3 25.3 10/16/2020 1702   TCO2 27 10/16/2020 1702   ACIDBASEDEF 2.0 10/16/2020 1702   O2SAT 58.8 10/21/2020 0326   CBG (last 3)  Recent Labs     10/22/20 0841 10/22/20 1123 10/22/20 1529  GLUCAP 74 94 99    Assessment/Plan: S/P Procedure(s) (LRB): CORONARY ARTERY BYPASS GRAFTING (CABG) TIMES FOUR USING BILATERAL INTERNAL MAMMARY ARTERIES AND ENDOSCOPICALLY HARVESTED GREATER SAPHENOUS VEIN AND INSERTION OF 50CC INTRAAORTIC BALLOON PUMP (N/A) TRANSESOPHAGEAL ECHOCARDIOGRAM (TEE) (N/A) INDOCYANINE GREEN FLUORESCENCE IMAGING (ICG) (N/A) ENDOVEIN HARVEST OF GREATER SAPHENOUS VEIN (Right) Mobilize Diuresis See progression orders   LOS: 8 days    [name] 10/22/2020

## 2020-10-23 LAB — BASIC METABOLIC PANEL
Anion gap: 9 (ref 5–15)
BUN: 28 mg/dL — ABNORMAL HIGH (ref 8–23)
CO2: 37 mmol/L — ABNORMAL HIGH (ref 22–32)
Calcium: 8.8 mg/dL — ABNORMAL LOW (ref 8.9–10.3)
Chloride: 98 mmol/L (ref 98–111)
Creatinine, Ser: 0.9 mg/dL (ref 0.61–1.24)
GFR, Estimated: >60 mL/min (ref >60)
Glucose, Bld: 113 mg/dL — ABNORMAL HIGH (ref 70–99)
Potassium: 4.2 mmol/L (ref 3.5–5.1)
Sodium: 144 mmol/L (ref 135–145)

## 2020-10-23 LAB — GLUCOSE, CAPILLARY
Glucose-Capillary: 103 mg/dL — ABNORMAL HIGH (ref 70–99)
Glucose-Capillary: 104 mg/dL — ABNORMAL HIGH (ref 70–99)
Glucose-Capillary: 105 mg/dL — ABNORMAL HIGH (ref 70–99)
Glucose-Capillary: 115 mg/dL — ABNORMAL HIGH (ref 70–99)
Glucose-Capillary: 117 mg/dL — ABNORMAL HIGH (ref 70–99)
Glucose-Capillary: 118 mg/dL — ABNORMAL HIGH (ref 70–99)
Glucose-Capillary: 118 mg/dL — ABNORMAL HIGH (ref 70–99)
Glucose-Capillary: 72 mg/dL (ref 70–99)
Glucose-Capillary: 80 mg/dL (ref 70–99)

## 2020-10-23 MED ORDER — ASPIRIN 81 MG PO CHEW: 81.0000 mg | CHEWABLE_TABLET | Freq: Every day | ORAL | Status: DC

## 2020-10-23 MED ORDER — FUROSEMIDE 10 MG/ML IJ SOLN: 40.0000 mg | Freq: Every day | INTRAMUSCULAR | 0 refills | Status: DC

## 2020-10-23 MED ORDER — COLCHICINE 0.6 MG PO TABS: 0.6000 mg | ORAL_TABLET | Freq: Two times a day (BID) | ORAL | Status: DC

## 2020-10-23 MED ORDER — PANTOPRAZOLE SODIUM 40 MG PO PACK: 40.0000 mg | PACK | Freq: Every day | ORAL | Status: DC

## 2020-10-23 MED ORDER — VITAL 1.5 CAL PO LIQD: 1000.0000 mL | ORAL | Status: DC

## 2020-10-23 MED ORDER — PHENOL 1.4 % MT LIQD
1.0000 | OROMUCOSAL | Status: DC | PRN
  Filled 2020-10-23: qty 177

## 2020-10-23 MED ORDER — CLOPIDOGREL BISULFATE 75 MG PO TABS: 75.0000 mg | ORAL_TABLET | Freq: Every day | ORAL | Status: DC

## 2020-10-23 MED ORDER — NICOTINE 21 MG/24HR TD PT24: 21.0000 mg | MEDICATED_PATCH | Freq: Every day | TRANSDERMAL | 0 refills | Status: DC

## 2020-10-23 MED ORDER — PROSOURCE TF PO LIQD: 45.0000 mL | Freq: Four times a day (QID) | ORAL | Status: DC

## 2020-10-23 MED ORDER — TRAMADOL HCL 50 MG PO TABS: 50.0000 mg | ORAL_TABLET | Freq: Four times a day (QID) | ORAL | Status: DC | PRN

## 2020-10-23 MED ORDER — METOPROLOL TARTRATE 25 MG PO TABS: 25.0000 mg | ORAL_TABLET | Freq: Two times a day (BID) | ORAL | Status: DC

## 2020-10-23 NOTE — Plan of Care (Signed)

## 2020-10-23 NOTE — Discharge Summary (Signed)
[address] IslandSuite [address]       ,[place] [id number]             [phone]    Physician Discharge Summary  Patient ID: Jonathan [name] MRN: [id number] DOB/AGE: 72-Mar-1952 72 y.o.  Admit date: 10/14/2020 Discharge date: 10/23/2020  Admission Diagnoses:  Patient Active Problem List   Diagnosis Date Noted  . Tachypnea   . ETOH abuse   . Tobacco abuse   . Essential hypertension   . Chronic obstructive pulmonary disease ([place])   . Coronary artery disease involving native coronary artery of native heart with unstable angina pectoris ([place])   . Cigarette smoker 04/17/2018  . DOE (dyspnea on exertion) 04/16/2018  . Chronic sinusitis of both maxillary sinuses 04/16/2018  . COPD GOLD II   active smoker 04/16/2018  . Obesity (BMI 30.0-34.9) 12/16/2015  . Tobacco abuse counseling 08/05/2014  . 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 Essential hypertension, benign 09/23/2012  . NSTEMI (non-ST elevated myocardial infarction) ([place]) 09/23/2012  . Hyperlipidemia 09/23/2012  . Hernia, inguinal 09/23/2012   Discharge Diagnoses:  Patient Active Problem List   Diagnosis Date Noted  . Cerebral embolism with cerebral infarction 10/21/2020  . History of open heart surgery   . Tachypnea   . ETOH abuse   . Tobacco abuse   . Essential hypertension   . Chronic obstructive pulmonary disease ([place])   . 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 Coronary artery disease involving native coronary artery of native heart with unstable angina pectoris ([place])   . Cigarette smoker 04/17/2018  . DOE (dyspnea on exertion) 04/16/2018  . Chronic sinusitis of both maxillary sinuses 04/16/2018  . COPD GOLD II   active smoker 04/16/2018  . Obesity (BMI 30.0-34.9) 12/16/2015  . Tobacco abuse counseling 08/05/2014  . 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 Essential hypertension, benign 09/23/2012  . NSTEMI (non-ST elevated myocardial infarction) ([place]) 09/23/2012  . Hyperlipidemia 09/23/2012  . Hernia, inguinal  09/23/2012   Discharged Condition: good      HPI:   70 yo man with known severe multivessel CAD who had previously declined CABG (11/20) after presenting with AMI represented now with increased exertional CP and SOB. He states he "can't do anything" without experiencing pain. He has also noted new-onset bilateral pedal edema. He underwent repeat LHC 10/14/20 showing severe CAD including in-stent restenosis of RCA territory. Consult received for CABG. He stopped Plavix 10/10/20; his PMHx is remarkable for tobacco abuse, hypercholesterolemia, and HTN. He is intolerant of lipid lowering therapy.  Hospital Course:   [name] is a 70 year old male patient who underwent a coronary artery bypass grafting x4 on 10/15/2020 with Dr. Orvan Seen.  He tolerated the procedure well and was transferred to the surgical ICU for continued care.  During his stay in the SICU the patient's IABP was removed on POD #1.  He was weaned and extubated on 10/17/2020.  The patient was weaned off drips as hemodynamics allowed.  His chest tube and arterial lines were removed without difficulty.  His chest tubes and arterial lines were removed without difficulty.  The patient developed difficulty swallowing and coughing with medications.  CT scan of the head was obtained and showed evidence of a small infarct in the left posterior cerebellar hemisphere.  The age was indeterminate but was felt possible to be acute vs subacute.  He was made NPO and underwent SLP evaluation which showed evidence of moderate risk of aspiration.  He had an NG  tube placed and was started on supplemental feedings.  Neurology consult was obtained and they recommended MRA of brain for further workup.  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  The patient repeatedly stated he wants to be a DNR.  Palliative care consult was requested to  assist with ensuring patient's goals are met.  The patient wished to be made comfort care.  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  The patient was made DNR and will continue to recover as able.  He underwent a Modified Barium Swallow on 2/9.  He was felt to be able to tolerate Dysphagia 2 diet.  Per neurology recommendations the patient was started on Plavix.  The patient continued to make progress.  He was evaluated by PT/OT who recommended CIR placement.  Consult was placed and insurance authorization was initiated.  The patient remained clinically stable.  He remained in NSR and his pacing wires were removed without difficulty.  He continued to work with PT/OT and SLP.  His surgical incisions were healing without evidence of infection.  Per speech pathology's most recent note: Diet recommendations: Dysphagia 2 (fine chop);Honey-thick liquid Liquids provided via: Cup Medication Administration: Whole meds with puree Supervision: Patient able to self feed;Intermittent supervision to cue for compensatory strategies Compensations: Slow rate;Small sips/bites Postural Changes and/or Swallow Maneuvers: Seated upright 90 degrees Speech pathology should re evaluate in next several days. Regarding diuresis, he is still several pounds above his pre op weight. I have ordered Lasix 40 mg daily for next 2 days and he will likely need a potassium supplement via tube. He may be able to be changed to oral Lasix if has good diuresis over the next 48 hours.  He was felt to be medically stable for discharge to CIR today.  Consults: neurology and Palliative care, PT/OT, SLP  Significant Diagnostic Studies:   Angiography:    1st Diag lesion is 90% stenosed.  Mid LAD lesion is 70% stenosed.  Prox RCA to Mid RCA lesion is 85% stenosed.  Prox RCA lesion is 80% stenosed with 30% stenosed side branch in RV Branch.  Mid RCA to Dist RCA lesion is 70%  stenosed.  Ost LM lesion is 45% stenosed.  Ost RCA to Prox RCA lesion is 90% stenosed.  Ost Cx lesion is 99% stenosed.   Severe multivessel progressive CAD with 40 to 50% ostial left main stenosis, diffuse 95% stenosis in the first diagonal branch of the LAD with 70% LAD stenosis beyond this diagonal vessel; 99% ostial left circumflex stenosis and progressive 85% in-stent restenosis in a dominant RCA with 90% stenosis at the initiation of the proximal stent with 70% stenosis proximal to the AV groove.  Relatively preserved LV function with EF estimated at 50 to 55%.  LVEDP 22 mm.   MR BRAIN:  MRI HEAD IMPRESSION:  1. Patchy small volume early subacute ischemic infarcts involving the left cerebellar hemisphere, corresponding with abnormality on prior CT. No associated hemorrhage or mass effect. 2. Otherwise normal brain MRI for age.  MRA HEAD IMPRESSION:  1. Technically limited exam due to motion artifact. 2. Negative intracranial MRA for large vessel occlusion. 3. Question focal severe proximal right M2 stenosis versus artifact. No definite proximal high-grade or correctable stenosis identified.  MRA NECK IMPRESSION:  1. Technically limited exam due  to motion artifact. 2. Mild atheromatous irregularity about the carotid bifurcations/proximal ICAs with no more than mild up to 20-30% stenosis on the left. 3. Wide patency of both vertebral arteries within the neck.  Treatments: surgery:    Coronary Artery Bypass Grafting x 4             Left Internal Mammary Artery to Distal Left Anterior Descending Coronary Artery; pedicled right internal mammary artery to right posterior lateral coronary Artery; Saphenous Vein Graft to first obtuse Marginal Branch of Left Circumflex Coronary Artery; Sapheonous Vein Graft to first diagonal Branch Coronary Artery; Endoscopic Vein Harvest from right thigh and Lower Leg Bilateral internal mammary artery harvesting Completion graft  surveillance with indocyanine green fluorescence angiography Rigid sternal reconstruction with linear plating system Insertion of intra-aortic balloon pump under TEE guidance via the left common femoral artery  Discharge Exam: Blood pressure 114/82, pulse 72, temperature 99 F (37.2 C), resp. rate 19, height 5' 8"  (1.727 m), weight 102.6 kg, SpO2 98 %. General appearance: alert Neurologic: intact Heart: regular rate and rhythm, S1, S2 normal, no murmur, click, rub or gallop Lungs: clear to auscultation bilaterally Abdomen: soft, non-tender; bowel sounds normal; no masses,  no organomegaly Extremities: extremities normal, atraumatic, no cyanosis or edema   Discharge Medications:  The patient has been discharged on:   1.Beta Blocker:  Yes [ X  ]                              No   [   ]                              If No, reason:  2.Ace Inhibitor/ARB: Yes [   ]                                     No  [  x  ]                                     If No, reason: Labile BP  3.Statin:   Yes [   ]                  No  [ X  ]                  If No, reason: intolerance, elevated LFTs  4.Ecasa:  Yes  [ X  ]                  No   [   ]                  If No, reason:    Discharge Instructions    Ambulatory referral to Neurology   Complete by: As directed    Follow up with stroke clinic in about 4 weeks. Thanks.     Allergies as of 10/23/2020      Reactions   Niaspan [niacin Er] Itching, Other (See Comments)   insomnia   Statins    LFT elevavation, CK elevevation      Medication List    STOP taking these medications   amLODipine 5 MG tablet Commonly known as: NORVASC   aspirin EC  81 MG tablet Replaced by: aspirin 81 MG chewable tablet   furosemide 20 MG tablet Commonly known as: LASIX Replaced by: furosemide 10 MG/ML injection   isosorbide mononitrate 60 MG 24 hr tablet Commonly known as: IMDUR   metoprolol succinate 25 MG 24 hr tablet Commonly known as:  TOPROL-XL   ramipril 10 MG capsule Commonly known as: ALTACE   ranolazine 1000 MG SR tablet Commonly known as: RANEXA   Repatha SureClick 831 MG/ML Soaj Generic drug: Evolocumab     TAKE these medications   albuterol 108 (90 Base) MCG/ACT inhaler Commonly known as: VENTOLIN HFA Inhale 2 puffs into the lungs every 4 (four) hours as needed for wheezing or shortness of breath (cough, shortness of breath or wheezing.).   aspirin 81 MG chewable tablet Place 1 tablet (81 mg total) into feeding tube daily. Start taking on: October 24, 2020 Replaces: aspirin EC 81 MG tablet   budesonide-formoterol 160-4.5 MCG/ACT inhaler Commonly known as: Symbicort Inhale 2 puffs into the lungs 2 (two) times daily. What changed:   when to take this  reasons to take this   clopidogrel 75 MG tablet Commonly known as: PLAVIX TAKE 1 TABLET BY MOUTH  DAILY What changed: Another medication with the same name was added. Make sure you understand how and when to take each.   clopidogrel 75 MG tablet Commonly known as: PLAVIX Place 1 tablet (75 mg total) into feeding tube daily. Start taking on: October 24, 2020 What changed: You were already taking a medication with the same name, and this prescription was added. Make sure you understand how and when to take each.   colchicine 0.6 MG tablet Place 1 tablet (0.6 mg total) into feeding tube 2 (two) times daily. What changed:   how to take this  when to take this  reasons to take this   feeding supplement (PROSource TF) liquid Place 45 mLs into feeding tube 4 (four) times daily.   feeding supplement (VITAL 1.5 CAL) Liqd Place 1,000 mLs into feeding tube continuous.   furosemide 10 MG/ML injection Commonly known as: LASIX Inject 4 mLs (40 mg total) into the vein daily for 2 days. Replaces: furosemide 20 MG tablet   metoprolol tartrate 25 MG tablet Commonly known as: LOPRESSOR Place 1 tablet (25 mg total) into feeding tube 2 (two) times  daily.   nicotine 21 mg/24hr patch Commonly known as: NICODERM CQ - dosed in mg/24 hours Place 1 patch (21 mg total) onto the skin daily. Start taking on: October 24, 2020   nitroGLYCERIN 0.4 MG SL tablet Commonly known as: NITROSTAT PLACE 1 TABLET UNDER THE TONGUE EVERY 5 MINUTES AS NEEDED FOR CHEST PAIN UP TO 3 DOSES, IF SYMPTOMS PERSIST CALL 911 What changed: See the new instructions.   pantoprazole sodium 40 mg/20 mL Pack Commonly known as: PROTONIX Place 20 mLs (40 mg total) into feeding tube daily. Start taking on: October 24, 2020   traMADol 50 MG tablet Commonly known as: ULTRAM Place 1 tablet (50 mg total) into feeding tube every 6 (six) hours as needed for severe pain.       Follow-up Information    [name], MD .   Specialty: Cardiology Contact information: [address] [place] [address] [phone]        [facility]. Schedule an appointment as soon as possible for a visit in 4 week(s).   Specialty: Neurology Contact information: [address] [place] [phone]  [name], MD Follow up.   Specialty: Cardiothoracic Surgery Contact information: [place] [address] [phone]             Please note, I have never seen this patient and only reconciled medications and updated speech pathology's recommendations  Signed: Lars Pinks PA-C 10/23/2020, 4:00 PM

## 2020-10-23 NOTE — Progress Notes (Signed)
Occupational Therapy Treatment Patient Details Name: Jonathan [name] MRN: [id number] DOB: [birth year] Today's Date: 10/23/2020    History of present illness : Pt is a 70 y.o. male presenting with coronary artery disease. Pt recieved coronary artery bypass grafting times four on 10/16/20. 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 Pt intubated from 10/15/20 to 10/17/20. PMH includes: COPD, hyperlipidemia, HTN, DOE, chronic bronchitis, chronic sinusitis, myocardial infarction   OT comments  Patient with great participation this date.  Deficits listed below continue to impact independence for ADL and mobility status, but he is continuing to make steady progress.  CIR has been recommended, and given his new willingness to participate, and how independent he was prior, this would be an excellent choice.  Patient is moving better, and thus his ADL status should begin to improve.  Acute OT will continue to follow, and assist with an eventual transition to CIR.  Follow Up Recommendations  CIR    Equipment Recommendations  3 in 1 bedside commode    Recommendations for Other Services      Precautions / Restrictions Precautions Precautions: Fall;Sternal Restrictions RUE Weight Bearing: Weight bearing as tolerated LLE Weight Bearing: Weight bearing as tolerated Other Position/Activity Restrictions: pushing through his knees       Mobility Bed Mobility Overal bed mobility: Needs Assistance       Supine to sit: Min assist        Transfers Overall transfer level: Needs assistance   Transfers: Sit to/from Stand Sit to Stand: Min assist Stand pivot transfers: Min guard            Balance Overall balance assessment: Mild deficits observed, not formally tested                                         ADL either performed or assessed with  clinical judgement   ADL Overall ADL's : Needs assistance/impaired Eating/Feeding: Independent;Sitting Eating/Feeding Details (indicate cue type and reason): Dys 2 with honey thick liquids Grooming: Wash/dry hands;Wash/dry face;Oral care;Sitting;Set up                               Functional mobility during ADLs: Min guard                         Cognition Arousal/Alertness: Awake/alert Behavior During Therapy: WFL for tasks assessed/performed Overall Cognitive Status: Within Functional Limits for tasks assessed                                          Exercises     Shoulder Instructions       General Comments  HR up to 105 with coughing spell    Pertinent Vitals/ Pain       Faces Pain Scale: Hurts even more Pain Location: throat, chest Pain Descriptors / Indicators: Discomfort;Grimacing;Guarding;Hervey Ard  Frequency  Min 2X/week        Progress Toward Goals  OT Goals(current goals can now be found in the care plan section)  Progress towards OT goals: Progressing toward goals  Acute Rehab OT Goals Patient Stated Goal: I need to get stronger OT Goal Formulation: With patient Time For Goal Achievement: 11/04/20 Potential to Achieve Goals: Good  Plan      Co-evaluation                 AM-PAC OT "6 Clicks" Daily Activity     Outcome Measure   Help from another person eating meals?: None Help from another person taking care of personal grooming?: None Help from another person toileting, which includes using toliet, bedpan, or urinal?: A Little Help from another person bathing (including washing, rinsing, drying)?: A Lot Help from another person to put on and taking off regular upper body clothing?: A Little Help from another person to put on and taking off regular lower body clothing?: A Lot 6 Click Score: 18    End of Session  Equipment Utilized During Treatment: Oxygen  OT Visit Diagnosis: Unsteadiness on feet (R26.81);Pain   Activity Tolerance Patient tolerated treatment well   Patient Left in chair;with call bell/phone within reach   Nurse Communication Mobility status        Time: 0830-0902 OT Time Calculation (min): 32 min  Charges: OT General Charges $OT Visit: 1 Visit OT Treatments $Self Care/Home Management : 8-22 mins $Therapeutic Exercise: 8-22 mins  10/23/2020  Rich, OTR/L  Acute Rehabilitation Services  Office:  [phone]    Metta Clines 10/23/2020, 9:11 AM

## 2020-10-23 NOTE — H&P (Signed)
Physical Medicine and Rehabilitation Admission H&P    CC: Stroke/CABG with functional deficits.   HPI: Jonathan [name] is a 70 year old male with history of HTN, COPD, CAD--had refused CABG last year but continues to decline with progressive weakness, DOE and BLE pain.  He reported that he could not walk household distances without shortness of breath or chest pain.  He was admitted on 10/14/2020 for cath revealing severe CAD with in-stent restenosis and underwent CABG x4 by Dr. Julien Girt on 10/15/2020.  Postop had issues with volume overload requiring IV diuresis as well as confusion with productive cough and hypoxia.  He did have decline in mental status with difficulty swallowing 02/07 and CT of head done revealing age-indeterminate left posterior cerebellar acute/subacute infarct.  Bedside swallow showed increased WOB with p.o. trials and odynophagia and he was made n.p.o. with placement of feeding tube from nutritional support.    Neurology consulted for input and recommended MRI/MRI brain for work-up.  This revealed small volume early subacute ischemic infarcts involving left cerebellar hemisphere, question focal severe proximal right MCA stenosis versus artifact and mild left ICA stenosis. 2D echo done showing EF 55 to 60% with evidence of protruding immobile plaque in descending aorta.  Dr. Erlinda Hong felt the stroke was embolic versus cardio procedure related and recommended DAPT as well as 30-day cardiac monitor on outpatient basis to rule out arrhythmia as cause of stroke.  Palliative care consulted to help discuss goals of care patient was requesting to be DNR to multiple providers.  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  Emotional support has been provided by staff and at this time he is agreeable to current interventions.  To alcohol as well as withdrawal from was made DNR multiple times.  As  respiratory status improved, MBS performed on 02/09 and patient was started on dysphagia 2 diet with honey liquids. Therapy has been ongoing and patient noted to be limited by weakness, tachycardia, DOE and sternal precautions. CIR recommended due to functional decline.    Review of Systems  Constitutional: Negative for chills and fever.  HENT: Negative for hearing loss.   Respiratory: Positive for shortness of breath and wheezing.   Cardiovascular: Positive for leg swelling. Negative for chest pain.  Gastrointestinal: Negative for constipation and heartburn.  Genitourinary: Negative for dysuria.  Musculoskeletal: Negative for myalgias.  Skin: Negative for rash.  Neurological: Positive for weakness.  Psychiatric/Behavioral: The patient has insomnia. The patient is not nervous/anxious.       Past Medical History:  Diagnosis Date  . Arthritis    hands   . Chronic bronchitis ([place])   . Chronic sinusitis of both maxillary sinuses 04/16/2018   Sinus CT 04/25/2018 Mild mucoperiosteal thickening involving the frontoethmoidal sinuses. Otherwise largely clear sinuses with no evidence for acute sinusitis.   [name] COPD GOLD II   active smoker 04/16/2018   Spirometry 04/16/2018  FEV1 1.43 (47%)  Ratio 62  s prior rx  - 04/16/2018    try symbicort 160 2bid - PFT's  06/08/2018  FEV1 2.03 (71 % ) ratio 61  p 5 % improvement from saba p nothing prior to study with DLCO  63 % corrects to 72  % for alv volume    -  06/08/2018  After extensive coaching inhaler device,  effectiveness =    75%  (short ti/ variable flow) > continue symbicort 160 2bid   .  Coronary artery disease   . Coronary artery disease involving native coronary artery of native heart with unstable angina pectoris ([place])   . DOE (dyspnea on exertion) 04/16/2018  . Essential hypertension, benign 09/23/2012   Changed acei to arb 04/16/2018 due to pseudowheeze > improved 06/08/2018   . Hernia, inguinal 09/23/2012  . Hyperlipidemia    has not tolerated statins,   would not pursue PSK9 inhibitor as advised by Dr Claiborne Billings  . Hypertension   . Myocardial infarction ([place])   . NSTEMI (non-ST elevated myocardial infarction) ([place]) 09/23/2012  . 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 Tobacco abuse     Past Surgical History:  Procedure Laterality Date  . CARDIAC CATHETERIZATION    . CARDIOVASCULAR STRESS TEST  11/11/2011  . CORONARY ANGIOPLASTY     stents- 01/2004   . CORONARY ARTERY BYPASS GRAFT N/A 10/15/2020   Procedure: CORONARY ARTERY BYPASS GRAFTING (CABG) TIMES FOUR USING BILATERAL INTERNAL MAMMARY ARTERIES AND ENDOSCOPICALLY HARVESTED GREATER SAPHENOUS VEIN AND INSERTION OF 50CC INTRAAORTIC BALLOON PUMP;  Surgeon: [name], MD;  Location: [facility];  Service: Open Heart Surgery;  Laterality: N/A;  possible BIMA  . ENDOVEIN HARVEST OF GREATER SAPHENOUS VEIN Right 10/15/2020   Procedure: ENDOVEIN HARVEST OF GREATER SAPHENOUS VEIN;  Surgeon: [name], MD;  Location: [facility];  Service: Open Heart Surgery;  Laterality: Right;  . heart stents  01/2004  . INGUINAL HERNIA REPAIR Right 12/06/2012   Procedure: HERNIA REPAIR INGUINAL ADULT;  Surgeon: [name], DO;  Location: [facility];  Service: General;  Laterality: Right;  . INSERTION OF MESH Right 12/06/2012   Procedure: INSERTION OF MESH;  Surgeon: [name], DO;  Location: [facility];  Service: General;  Laterality: Right;  . LEFT HEART CATH AND CORONARY ANGIOGRAPHY N/A 07/22/2019   Procedure: LEFT HEART CATH AND CORONARY ANGIOGRAPHY;  Surgeon: [name], MD;  Location: [facility];  Service: Cardiovascular;  Laterality: N/A;  . LEFT HEART CATH AND CORONARY ANGIOGRAPHY N/A 10/14/2020   Procedure: LEFT HEART CATH AND CORONARY ANGIOGRAPHY;  Surgeon: [name], MD;  Location: [facility];  Service: Cardiovascular;  Laterality: N/A;  . TEE WITHOUT CARDIOVERSION N/A 10/15/2020   Procedure: TRANSESOPHAGEAL ECHOCARDIOGRAM (TEE);  Surgeon: [name], MD;  Location: [facility];  Service:  Open Heart Surgery;  Laterality: N/A;    Family History  Problem Relation Age of Onset  . CAD Father        CABG with subsequent stroke    Social History:  Married. reports that he has been smoking cigarettes--1/2 PPD. He has a 53.00 pack-year smoking history. He has never used smokeless tobacco. He reports current alcohol use--3 beer per day. He reports that he does not use drugs.   Allergies  Allergen Reactions  . Niaspan [Niacin Er] Itching and Other (See Comments)    insomnia  . Statins     LFT elevavation, CK elevevation    Medications Prior to Admission  Medication Sig Dispense Refill  . albuterol (PROVENTIL HFA;VENTOLIN HFA) 108 (90 Base) MCG/ACT inhaler Inhale 2 puffs into the lungs every 4 (four) hours as needed for wheezing or shortness of breath (cough, shortness of breath or wheezing.). 1 Inhaler 5  . amLODipine (NORVASC) 5 MG tablet Take 1 tablet (5 mg total) by mouth daily. 30 tablet 6  . aspirin EC 81 MG tablet Take 81 mg by mouth every morning.    . budesonide-formoterol (SYMBICORT) 160-4.5 MCG/ACT inhaler Inhale 2 puffs into  the lungs 2 (two) times daily. (Patient taking differently: Inhale 2 puffs into the lungs 2 (two) times daily as needed (asthma).) 1 each 11  . clopidogrel (PLAVIX) 75 MG tablet TAKE 1 TABLET BY MOUTH  DAILY (Patient taking differently: Take 75 mg by mouth daily.) 90 tablet 3  . furosemide (LASIX) 20 MG tablet Take 1 tablet (20 mg total) by mouth daily as needed (SWELLING). 30 tablet 6  . isosorbide mononitrate (IMDUR) 60 MG 24 hr tablet Take to 2 tablet ( 120 mg) in the morning and  1 tablet (60 mg ) at bedtime (Patient taking differently: Take 60-120 mg by mouth See admin instructions. Take to 2 tablet ( 120 mg) in the morning and  1 tablet (60 mg ) at bedtime) 90 tablet 6  . metoprolol succinate (TOPROL-XL) 25 MG 24 hr tablet Take 1.5 tablets (37.5 mg total) by mouth daily. 135 tablet 1  . nitroGLYCERIN (NITROSTAT) 0.4 MG SL tablet PLACE 1 TABLET  UNDER THE TONGUE EVERY 5 MINUTES AS NEEDED FOR CHEST PAIN UP TO 3 DOSES, IF SYMPTOMS PERSIST CALL 911 (Patient taking differently: Place 0.4 mg under the tongue every 5 (five) minutes as needed for chest pain.) 25 tablet 6  . ramipril (ALTACE) 10 MG capsule TAKE 1 CAPSULE BY MOUTH  DAILY (Patient taking differently: Take 10 mg by mouth daily.) 90 capsule 3  . ranolazine (RANEXA) 1000 MG SR tablet Take 1 tablet (1,000 mg total) by mouth 2 (two) times daily. 60 tablet 11  . colchicine 0.6 MG tablet Take 0.6 mg by mouth 2 (two) times daily as needed (gout).    . Evolocumab (REPATHA SURECLICK) 761 MG/ML SOAJ Inject 140 mg into the skin every 14 (fourteen) days. (Patient not taking: No sig reported) 2 pen 11    Drug Regimen Review  Drug regimen was reviewed and remains appropriate with no significant issues identified  Home: Home Living Family/patient expects to be discharged to:: Private residence Living Arrangements: Spouse/significant other Available Help at Discharge: Family,Available 24 hours/day Type of Home: House Home Access: Stairs to enter [employer] of Steps: couple Entrance Stairs-Rails: Right,Left Home Layout: One level Bathroom Shower/Tub: Chiropodist:  (comfort leve) Bathroom Accessibility: Yes Home Equipment: Cane - single point,Crutches  Lives With: Spouse   Functional History: Prior Function Level of Independence: Independent  Functional Status:  Mobility: Bed Mobility Overal bed mobility: Needs Assistance Bed Mobility: Rolling,Supine to Sit Rolling: Min guard Supine to sit: Min assist Sit to supine: Mod assist General bed mobility comments: OOB in chair Transfers Overall transfer level: Needs assistance Transfers: Sit to/from Stand Sit to Stand: Min assist Stand pivot transfers: Min guard General transfer comment: modA and increased time for power up to standing, Ambulation/Gait Ambulation/Gait assistance: Min assist Gait  Distance (Feet): 150 Feet Assistive device: Rolling walker (2 wheeled) (EVA walker) Gait Pattern/deviations: Step-through pattern,Decreased step length - right,Decreased step length - left,Shuffle General Gait Details: min A for steadying with EVA walker, pt with 1x standing rest break half way through due to increased WoB Gait velocity: slower Gait velocity interpretation: <1.8 ft/sec, indicate of risk for recurrent falls    ADL: ADL Overall ADL's : Needs assistance/impaired Eating/Feeding: Independent,Sitting Eating/Feeding Details (indicate cue type and reason): Dys 2 with honey thick liquids Grooming: Wash/dry hands,Wash/dry face,Oral care,Sitting,Set up Upper Body Dressing : Moderate assistance,Sitting Lower Body Dressing: Moderate assistance,Sit to/from stand Functional mobility during ADLs: Min guard  Cognition: Cognition Overall Cognitive Status: Within Functional Limits for tasks assessed  Orientation Level: Oriented X4 Cognition Arousal/Alertness: Awake/alert Behavior During Therapy: WFL for tasks assessed/performed Overall Cognitive Status: Within Functional Limits for tasks assessed   Blood pressure 114/82, pulse 72, temperature 99 F (37.2 C), resp. rate 19, height 5\' 8"  (1.727 m), weight 102.6 kg, SpO2 98 %. Physical Exam Vitals and nursing note reviewed.  Constitutional:      General: He is not in acute distress.    Appearance: Normal appearance. He is ill-appearing.     Comments: cortak in place. On 4 L oxygen per [place].   HENT:     Head: Normocephalic.     Comments: dysphonic    Right Ear: External ear normal.     Left Ear: External ear normal.     Mouth/Throat:     Mouth: Mucous membranes are moist.     Pharynx: Oropharynx is clear.  Eyes:     Extraocular Movements: Extraocular movements intact.     Pupils: Pupils are equal, round, and reactive to light.  Cardiovascular:     Rate and Rhythm: Normal rate and regular rhythm.     Heart sounds: No murmur  heard. No gallop.   Pulmonary:     Effort: Pulmonary effort is normal. No respiratory distress.     Breath sounds: Wheezing present.  Abdominal:     General: Bowel sounds are normal. There is no distension.     Palpations: Abdomen is soft.     Tenderness: There is no abdominal tenderness.  Musculoskeletal:        General: Swelling (2+ edema BLE) present.     Cervical back: Normal range of motion.  Skin:    General: Skin is warm.     Comments: Scattered ecchymoses in both UE's  Neurological:     Mental Status: He is alert.     Comments: Alert and oriented x 3. Normal insight and awareness. Intact Memory. Normal language and speech. Cranial nerve exam unremarkable UE 4/5 prox to distal. Sl decrease in FTN coordination on left. No tremor. LE 4-/5 prox to 4/5 distally. No focal sensory abnl. DTR's 1+  Psychiatric:        Mood and Affect: Mood normal.        Behavior: Behavior normal.     Results for orders placed or performed during the hospital encounter of 10/14/20 (from the past 48 hour(s))  Glucose, capillary     Status: None   Collection Time: 10/21/20  8:02 PM  Result Value Ref Range   Glucose-Capillary 90 70 - 99 mg/dL    Comment: Glucose reference range applies only to samples taken after fasting for at least 8 hours.  Glucose, capillary     Status: Abnormal   Collection Time: 10/21/20 11:19 PM  Result Value Ref Range   Glucose-Capillary 113 (H) 70 - 99 mg/dL    Comment: Glucose reference range applies only to samples taken after fasting for at least 8 hours.  Basic metabolic panel     Status: Abnormal   Collection Time: 10/22/20  3:14 AM  Result Value Ref Range   Sodium 146 (H) 135 - 145 mmol/L   Potassium 3.7 3.5 - 5.1 mmol/L   Chloride 98 98 - 111 mmol/L   CO2 37 (H) 22 - 32 mmol/L   Glucose, Bld 102 (H) 70 - 99 mg/dL    Comment: Glucose reference range applies only to samples taken after fasting for at least 8 hours.   BUN 27 (H) 8 - 23 mg/dL   Creatinine, Ser  1.01 0.61 - 1.24 mg/dL   Calcium 8.1 (L) 8.9 - 10.3 mg/dL   GFR, Estimated >60 >60 mL/min    Comment: (NOTE) Calculated using the CKD-EPI Creatinine Equation (2021)    Anion gap 11 5 - 15    Comment: Performed at [facility] [address] [place], [place] [address]  CBC     Status: Abnormal   Collection Time: 10/22/20  3:14 AM  Result Value Ref Range   WBC 11.8 (H) 4.0 - 10.5 K/uL   RBC 3.19 (L) 4.22 - 5.81 MIL/uL   Hemoglobin 10.3 (L) 13.0 - 17.0 g/dL   HCT 33.7 (L) 39.0 - 52.0 %   MCV 105.6 (H) 80.0 - 100.0 fL   MCH 32.3 26.0 - 34.0 pg   MCHC 30.6 30.0 - 36.0 g/dL   RDW 13.2 11.5 - 15.5 %   Platelets 230 150 - 400 K/uL   nRBC 0.0 0.0 - 0.2 %    Comment: Performed at [facility], [place] [address] [place], [place] [address]  Glucose, capillary     Status: None   Collection Time: 10/22/20  3:24 AM  Result Value Ref Range   Glucose-Capillary 89 70 - 99 mg/dL    Comment: Glucose reference range applies only to samples taken after fasting for at least 8 hours.  Glucose, capillary     Status: None   Collection Time: 10/22/20  8:41 AM  Result Value Ref Range   Glucose-Capillary 74 70 - 99 mg/dL    Comment: Glucose reference range applies only to samples taken after fasting for at least 8 hours.  Glucose, capillary     Status: None   Collection Time: 10/22/20 11:23 AM  Result Value Ref Range   Glucose-Capillary 94 70 - 99 mg/dL    Comment: Glucose reference range applies only to samples taken after fasting for at least 8 hours.  Glucose, capillary     Status: None   Collection Time: 10/22/20  3:29 PM  Result Value Ref Range   Glucose-Capillary 99 70 - 99 mg/dL    Comment: Glucose reference range applies only to samples taken after fasting for at least 8 hours.  Glucose, capillary     Status: None   Collection Time: 10/22/20  9:15 PM  Result Value Ref Range   Glucose-Capillary 80 70 - 99 mg/dL    Comment: Glucose reference range applies only to samples taken  after fasting for at least 8 hours.  Glucose, capillary     Status: Abnormal   Collection Time: 10/23/20 12:11 AM  Result Value Ref Range   Glucose-Capillary 118 (H) 70 - 99 mg/dL    Comment: Glucose reference range applies only to samples taken after fasting for at least 8 hours.  Glucose, capillary     Status: Abnormal   Collection Time: 10/23/20  3:36 AM  Result Value Ref Range   Glucose-Capillary 104 (H) 70 - 99 mg/dL    Comment: Glucose reference range applies only to samples taken after fasting for at least 8 hours.  Glucose, capillary     Status: Abnormal   Collection Time: 10/23/20  4:12 AM  Result Value Ref Range   Glucose-Capillary 117 (H) 70 - 99 mg/dL    Comment: Glucose reference range applies only to samples taken after fasting for at least 8 hours.  Glucose, capillary     Status: None   Collection Time: 10/23/20  7:22 AM  Result Value Ref Range  Glucose-Capillary 72 70 - 99 mg/dL    Comment: Glucose reference range applies only to samples taken after fasting for at least 8 hours.  Basic metabolic panel     Status: Abnormal   Collection Time: 10/23/20  8:32 AM  Result Value Ref Range   Sodium 144 135 - 145 mmol/L   Potassium 4.2 3.5 - 5.1 mmol/L   Chloride 98 98 - 111 mmol/L   CO2 37 (H) 22 - 32 mmol/L   Glucose, Bld 113 (H) 70 - 99 mg/dL    Comment: Glucose reference range applies only to samples taken after fasting for at least 8 hours.   BUN 28 (H) 8 - 23 mg/dL   Creatinine, Ser 0.90 0.61 - 1.24 mg/dL   Calcium 8.8 (L) 8.9 - 10.3 mg/dL   GFR, Estimated >60 >60 mL/min    Comment: (NOTE) Calculated using the CKD-EPI Creatinine Equation (2021)    Anion gap 9 5 - 15    Comment: Performed at [facility] [address] [place], [place] [address]  Glucose, capillary     Status: Abnormal   Collection Time: 10/23/20 11:12 AM  Result Value Ref Range   Glucose-Capillary 105 (H) 70 - 99 mg/dL    Comment: Glucose reference range applies only to samples taken  after fasting for at least 8 hours.   DG Chest 1 View  Result Date: 10/22/2020 CLINICAL DATA:  Chest pain EXAM: CHEST  1 VIEW COMPARISON:  October 20, 2020 FINDINGS: Enteric tube tip is below the diaphragm. Central catheter tip is at the cavoatrial junction. No pneumothorax. There is atelectatic change in the bases. There is an equivocal left pleural effusion. Heart size and pulmonary vascularity within normal limits. Status post coronary artery bypass grafting. No adenopathy. No bone lesions. IMPRESSION: Tube and catheter positions as described without pneumothorax. Bibasilar atelectasis with equivocal left pleural effusion. Stable cardiac silhouette. Electronically Signed   By: Lowella Grip III M.D.   On: 10/22/2020 08:00       Medical Problem List and Plan: 1.  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  Antithrombotics: -DVT/anticoagulation:  Pharmaceutical: Lovenox--added  -antiplatelet therapy: Plavix/ASA 3. Pain Management:  Oxycodone prn severe and tramadol prn moderate pain 4. Mood: LCSW to follow for evaluation and support.   -antipsychotic agents: N/A 5. Neuropsych: This patient is capable of making decisions on his own behalf. 6. Skin/Wound Care: routine pressure relief measures.   --Monitor incisions for healing  7. Fluids/Electrolytes/Nutrition: Monitor I/O. Check lytes on Monday 8. CAD s/p CABG: Continue sternal precautions  --monitor for symptoms with increased activity  --On Lasix 40mg  IV bid for overload--monitor weight daily  --lopressor increased to 25mg  bid 2/10 to help manage tachycardia  -will add ACE wraps to LE to assist with LE edema.  9. HTN: Monitor BP tid-- Lasix, lopressor  10. H/o DOE/Hypoxia: Encourage pulmonary hygiene  --wean oxygen as able.  11. Pre-renal azotemia: Encourage fluids but monitor for signs of  overaload. 12. Dysphagia: Continue DII, honey liquids  --pt has a deviated septum, tube very uncomfortable  -he's eating well (100% breakfast)---will dc NGT  -push honeys. No IVF at this point given above 13. ABLA: Recheck CBC 02/14 14. Stress induced hyperglycemia: Hgb A1c-5.6. Monitor BS ac/hs and use SSI for control to help promote wound healing.  15. COPD: Used Symbicort prn at home--resume and  schedule.       Bary Leriche, PA-C 10/23/2020

## 2020-10-23 NOTE — Progress Notes (Signed)
Inpatient Rehab Admissions Coordinator:  There is a potential bed available for pt to admit to CIR Saturday, February 12th. Dr. Orvan Seen is aware and in agreement.  Dr. Naaman Plummer will assess pt in the morning to confirm medical appropriateness.  I will confirm bed availability tomorrow.  Floor nurse can call [facility] nursing station [phone]) after noon.     Gayland Curry, [place], [place] Admissions Coordinator [phone]

## 2020-10-24 LAB — GLUCOSE, CAPILLARY
Glucose-Capillary: 111 mg/dL — ABNORMAL HIGH (ref 70–99)
Glucose-Capillary: 104 mg/dL — ABNORMAL HIGH (ref 70–99)
Glucose-Capillary: 72 mg/dL (ref 70–99)
Glucose-Capillary: 89 mg/dL (ref 70–99)
Glucose-Capillary: 77 mg/dL (ref 70–99)
Glucose-Capillary: 86 mg/dL (ref 70–99)
Glucose-Capillary: 84 mg/dL (ref 70–99)
Glucose-Capillary: 92 mg/dL (ref 70–99)

## 2020-10-24 MED ORDER — VITAL 1.5 CAL PO LIQD: 1000.0000 mL | ORAL | Status: DC

## 2020-10-24 MED ORDER — PHENOL 1.4 % MT LIQD: 1.0000 | OROMUCOSAL | 0 refills | Status: DC | PRN

## 2020-10-24 MED ORDER — VITAL 1.5 CAL PO LIQD
1000.0000 mL | ORAL | Status: DC
  Administered 2020-10-24: 1000 mL
  Filled 2020-10-24: qty 1000

## 2020-10-24 NOTE — Plan of Care (Signed)
  Problem: Education: Goal: Knowledge of General Education information will improve Description: Including pain rating scale, medication(s)/side effects and non-pharmacologic comfort measures Outcome: Progressing   Problem: Clinical Measurements: Goal: Will remain free from infection Outcome: Progressing Goal: Diagnostic test results will improve Outcome: Progressing Goal: Respiratory complications will improve Outcome: Progressing   Problem: Activity: Goal: Risk for activity intolerance will decrease Outcome: Progressing   Problem: Nutrition: Goal: Adequate nutrition will be maintained Outcome: Progressing   Problem: Elimination: Goal: Will not experience complications related to bowel motility Outcome: Progressing Goal: Will not experience complications related to urinary retention Outcome: Progressing   Problem: Pain Managment: Goal: General experience of comfort will improve Outcome: Progressing   Problem: Education: Goal: Knowledge of disease or condition will improve Outcome: Progressing Goal: Knowledge of the prescribed therapeutic regimen will improve Outcome: Progressing

## 2020-10-24 NOTE — Plan of Care (Signed)

## 2020-10-24 NOTE — Progress Notes (Signed)
Inpatient Rehab Admissions Coordinator:  Saw pt and wife at bedside. Informed them that insurance authorization has been received. Informed them that awaiting bed availability in CIR.  Will continue to follow.   Gayland Curry, [place], [place] Admissions Coordinator [phone]

## 2020-10-24 NOTE — Progress Notes (Signed)
    CARDIOLOGY RECOMMENDATIONS:  Discharge is anticipated in the next 48 hours to rehab Recommendations for medications and follow up:  Discharge Medications: Continue medications as they are currently listed in the [facility]. Exceptions to the above:  None  Follow Up:  The patient's Primary Cardiologist is Shelva Majestic, MD   We are happy to see in rehab.  Please call us if necessary.  Otherwise please let us know when he is to be discharged and we can arrange.  I will cancel the 2/22 appt and we will need to reschedule based on when he is discharged.   Signed,  Minus Breeding, MD  10:21 AM 10/24/2020  [facility]

## 2020-10-24 NOTE — Progress Notes (Signed)
1300-1325 Pt hopeful to go to Rehab today.  Offered to walk with pt but wants to wait at this time. Encouraged to walk with staff later. Gave pt and wife heart healthy diet, walking instructions, reviewed sternal precautions and wound care, for when pt returns home. Discussed CRP 2 and gave brochure. Pt stated will discuss with doctors after completion of Rehab if interested. Pt stated he has quit smoking. Encouraged IS. Understanding voiced. Graylon Good RN BSN 10/24/2020 1:28 PM

## 2020-10-24 NOTE — Progress Notes (Signed)
Per order sutures removed and steri strips place on site. Patient tolerated well.

## 2020-10-24 NOTE — Progress Notes (Addendum)
      [address]       [place] [address]             [phone]        9 Days Post-Op Procedure(s) (LRB): CORONARY ARTERY BYPASS GRAFTING (CABG) TIMES FOUR USING BILATERAL INTERNAL MAMMARY ARTERIES AND ENDOSCOPICALLY HARVESTED GREATER SAPHENOUS VEIN AND INSERTION OF 50CC INTRAAORTIC BALLOON PUMP (N/A) TRANSESOPHAGEAL ECHOCARDIOGRAM (TEE) (N/A) INDOCYANINE GREEN FLUORESCENCE IMAGING (ICG) (N/A) ENDOVEIN HARVEST OF GREATER SAPHENOUS VEIN (Right)  Subjective: Patient has had hoarseness post op. His nares where Cortrak goes down feels blocked. He really wants Cortrak out.  Objective: Vital signs in last 24 hours: Temp:  [97.5 F (36.4 C)-99 F (37.2 C)] 97.8 F (36.6 C) (02/12 0858) Pulse Rate:  [65-100] 90 (02/12 0858) Cardiac Rhythm: Normal sinus rhythm (02/12 0750) Resp:  [15-26] 24 (02/12 0858) BP: (90-123)/(58-82) 108/80 (02/12 0858) SpO2:  [90 %-100 %] 95 % (02/12 0858) Weight:  [100.7 kg] 100.7 kg (02/12 0558)  Pre op weight  99.8 kg Current Weight  10/24/20 100.7 kg      Intake/Output from previous day: 02/11 0701 - 02/12 0700 In: 1611 [P.O.:291; NG/GT:1320] Out: 2200 [Urine:2200]   Physical Exam:  Cardiovascular: RRR Pulmonary: Diminished breath sounds throughtout Abdomen: Soft, non tender, bowel sounds present. Extremities: Mild bilateral lower extremity edema. Wounds: Clean and dry.    Lab Results: CBC: Recent Labs    10/22/20 0314  WBC 11.8*  HGB 10.3*  HCT 33.7*  PLT 230   BMET:  Recent Labs    10/22/20 0314 10/23/20 0832  NA 146* 144  K 3.7 4.2  CL 98 98  CO2 37* 37*  GLUCOSE 102* 113*  BUN 27* 28*  CREATININE 1.01 0.90  CALCIUM 8.1* 8.8*    PT/INR:  Lab Results  Component Value Date   INR 1.5 (H) 10/15/2020   INR 1.2 10/15/2020   ABG:  INR: Will add last result for INR, ABG once components are confirmed Will add last 4 CBG results once components are confirmed  Assessment/Plan:  1. CV - SR with HR  in the 90's. On baby ec asa and Plavix, Lopressor 25 mg bid. 2.  Pulmonary - On room air. Encourage incentive spirometer. 3. Volume Overload - on Lasix 40 mg IV bid 4.  Expected post op acute blood loss anemia - Last H and H 10.3 and 33.7 5. CBGs 103/111/104. On secondary to TFs 6. GI-per speech pathology:   Diet recommendations: Dysphagia 2 (fine chop);Honey-thick liquid Liquids provided via: Cup Medication Administration: Whole meds with puree Supervision: Patient able to self feed;Intermittent supervision to cue for compensatory strategies Compensations: Slow rate;Small sips/bites Postural Changes and/or Swallow Maneuvers: Seated upright 90 degrees   Patient still with Cortrak. Will change TFs to night time, have nutrition/speech pathology evaluate Monday and then remove Cortrak later 7. To CIR  Donielle M ZimmermanPA-C 10/24/2020,10:57 AM

## 2020-10-25 ENCOUNTER — Inpatient Hospital Stay (HOSPITAL_COMMUNITY)
Admission: RE | Admit: 2020-10-25 | Discharge: 2020-11-04 | DRG: 092 | Disposition: A | Discharge status: Home Health | Payer: Medicare Other | Source: Intra-hospital | Attending: Physical Medicine & Rehabilitation | Admitting: Physical Medicine & Rehabilitation

## 2020-10-25 ENCOUNTER — Encounter (HOSPITAL_COMMUNITY): Payer: Self-pay | Admitting: Physical Medicine & Rehabilitation

## 2020-10-25 ENCOUNTER — Other Ambulatory Visit: Payer: Self-pay

## 2020-10-25 DIAGNOSIS — R0902 Hypoxemia: Secondary | ICD-10-CM | POA: Diagnosis present

## 2020-10-25 DIAGNOSIS — J449 Chronic obstructive pulmonary disease, unspecified: Secondary | ICD-10-CM | POA: Diagnosis present

## 2020-10-25 DIAGNOSIS — Z7951 Long term (current) use of inhaled steroids: Secondary | ICD-10-CM

## 2020-10-25 DIAGNOSIS — Z79899 Other long term (current) drug therapy: Secondary | ICD-10-CM

## 2020-10-25 DIAGNOSIS — K117 Disturbances of salivary secretion: Secondary | ICD-10-CM | POA: Diagnosis present

## 2020-10-25 DIAGNOSIS — J342 Deviated nasal septum: Secondary | ICD-10-CM | POA: Diagnosis present

## 2020-10-25 DIAGNOSIS — R197 Diarrhea, unspecified: Secondary | ICD-10-CM | POA: Diagnosis not present

## 2020-10-25 DIAGNOSIS — J320 Chronic maxillary sinusitis: Secondary | ICD-10-CM | POA: Diagnosis present

## 2020-10-25 DIAGNOSIS — T504X5A Adverse effect of drugs affecting uric acid metabolism, initial encounter: Secondary | ICD-10-CM | POA: Diagnosis not present

## 2020-10-25 DIAGNOSIS — D62 Acute posthemorrhagic anemia: Secondary | ICD-10-CM | POA: Diagnosis present

## 2020-10-25 DIAGNOSIS — Z888 Allergy status to other drugs, medicaments and biological substances status: Secondary | ICD-10-CM

## 2020-10-25 DIAGNOSIS — R739 Hyperglycemia, unspecified: Secondary | ICD-10-CM | POA: Diagnosis present

## 2020-10-25 DIAGNOSIS — F17210 Nicotine dependence, cigarettes, uncomplicated: Secondary | ICD-10-CM | POA: Diagnosis present

## 2020-10-25 DIAGNOSIS — G4700 Insomnia, unspecified: Secondary | ICD-10-CM | POA: Diagnosis present

## 2020-10-25 DIAGNOSIS — R7989 Other specified abnormal findings of blood chemistry: Secondary | ICD-10-CM | POA: Diagnosis not present

## 2020-10-25 DIAGNOSIS — E785 Hyperlipidemia, unspecified: Secondary | ICD-10-CM | POA: Diagnosis present

## 2020-10-25 DIAGNOSIS — Z955 Presence of coronary angioplasty implant and graft: Secondary | ICD-10-CM

## 2020-10-25 DIAGNOSIS — I69391 Dysphagia following cerebral infarction: Secondary | ICD-10-CM

## 2020-10-25 DIAGNOSIS — R2689 Other abnormalities of gait and mobility: Secondary | ICD-10-CM | POA: Diagnosis present

## 2020-10-25 DIAGNOSIS — M109 Gout, unspecified: Secondary | ICD-10-CM | POA: Diagnosis present

## 2020-10-25 DIAGNOSIS — M7989 Other specified soft tissue disorders: Secondary | ICD-10-CM | POA: Diagnosis present

## 2020-10-25 DIAGNOSIS — Z951 Presence of aortocoronary bypass graft: Secondary | ICD-10-CM

## 2020-10-25 DIAGNOSIS — I700 Atherosclerosis of aorta: Secondary | ICD-10-CM | POA: Diagnosis present

## 2020-10-25 DIAGNOSIS — Z8249 Family history of ischemic heart disease and other diseases of the circulatory system: Secondary | ICD-10-CM | POA: Diagnosis not present

## 2020-10-25 DIAGNOSIS — E162 Hypoglycemia, unspecified: Secondary | ICD-10-CM | POA: Diagnosis not present

## 2020-10-25 DIAGNOSIS — I252 Old myocardial infarction: Secondary | ICD-10-CM | POA: Diagnosis not present

## 2020-10-25 DIAGNOSIS — Z7982 Long term (current) use of aspirin: Secondary | ICD-10-CM

## 2020-10-25 DIAGNOSIS — I639 Cerebral infarction, unspecified: Secondary | ICD-10-CM | POA: Diagnosis present

## 2020-10-25 DIAGNOSIS — R1312 Dysphagia, oropharyngeal phase: Secondary | ICD-10-CM | POA: Diagnosis present

## 2020-10-25 DIAGNOSIS — I2510 Atherosclerotic heart disease of native coronary artery without angina pectoris: Secondary | ICD-10-CM | POA: Diagnosis present

## 2020-10-25 DIAGNOSIS — I10 Essential (primary) hypertension: Secondary | ICD-10-CM | POA: Diagnosis present

## 2020-10-25 DIAGNOSIS — Z7902 Long term (current) use of antithrombotics/antiplatelets: Secondary | ICD-10-CM

## 2020-10-25 LAB — GLUCOSE, CAPILLARY
Glucose-Capillary: 76 mg/dL (ref 70–99)
Glucose-Capillary: 77 mg/dL (ref 70–99)
Glucose-Capillary: 82 mg/dL (ref 70–99)
Glucose-Capillary: 77 mg/dL (ref 70–99)
Glucose-Capillary: 94 mg/dL (ref 70–99)

## 2020-10-25 MED ORDER — ONDANSETRON HCL 4 MG/2ML IJ SOLN: 4.0000 mg | Freq: Four times a day (QID) | INTRAMUSCULAR | Status: DC | PRN

## 2020-10-25 MED ORDER — TRAZODONE HCL 50 MG PO TABS
25.0000 mg | ORAL_TABLET | Freq: Every evening | ORAL | Status: DC | PRN
  Administered 2020-10-25 – 2020-10-28 (×3): 50 mg via ORAL
  Filled 2020-10-25 (×3): qty 1

## 2020-10-25 MED ORDER — VITAL 1.5 CAL PO LIQD: 1000.0000 mL | ORAL | Status: DC

## 2020-10-25 MED ORDER — CLOPIDOGREL BISULFATE 75 MG PO TABS
75.0000 mg | ORAL_TABLET | Freq: Every day | ORAL | Status: DC
  Administered 2020-10-26 – 2020-11-04 (×10): 75 mg via ORAL
  Filled 2020-10-25 (×10): qty 1

## 2020-10-25 MED ORDER — GUAIFENESIN 200 MG PO TABS
200.0000 mg | ORAL_TABLET | Freq: Four times a day (QID) | ORAL | Status: DC
  Administered 2020-10-25 – 2020-11-04 (×36): 200 mg
  Filled 2020-10-25 (×36): qty 1

## 2020-10-25 MED ORDER — OXYCODONE HCL 5 MG PO TABS: 5.0000 mg | ORAL_TABLET | ORAL | Status: DC | PRN

## 2020-10-25 MED ORDER — FUROSEMIDE 10 MG/ML IJ SOLN: 40.0000 mg | Freq: Two times a day (BID) | INTRAMUSCULAR | Status: DC

## 2020-10-25 MED ORDER — ALUM & MAG HYDROXIDE-SIMETH 200-200-20 MG/5ML PO SUSP: 30.0000 mL | ORAL | Status: DC | PRN

## 2020-10-25 MED ORDER — COLCHICINE 0.6 MG PO TABS
0.6000 mg | ORAL_TABLET | Freq: Two times a day (BID) | ORAL | Status: DC
  Administered 2020-10-25 – 2020-10-27 (×4): 0.6 mg via ORAL
  Filled 2020-10-25 (×4): qty 1

## 2020-10-25 MED ORDER — IPRATROPIUM-ALBUTEROL 0.5-2.5 (3) MG/3ML IN SOLN
3.0000 mL | Freq: Four times a day (QID) | RESPIRATORY_TRACT | Status: DC | PRN
  Administered 2020-10-31 – 2020-11-02 (×3): 3 mL via RESPIRATORY_TRACT
  Filled 2020-10-25 (×3): qty 3

## 2020-10-25 MED ORDER — NICOTINE 21 MG/24HR TD PT24
21.0000 mg | MEDICATED_PATCH | Freq: Every day | TRANSDERMAL | Status: DC
  Administered 2020-10-26 – 2020-11-04 (×10): 21 mg via TRANSDERMAL
  Filled 2020-10-25 (×10): qty 1

## 2020-10-25 MED ORDER — PROCHLORPERAZINE 25 MG RE SUPP: 12.5000 mg | Freq: Four times a day (QID) | RECTAL | Status: DC | PRN

## 2020-10-25 MED ORDER — GUAIFENESIN-DM 100-10 MG/5ML PO SYRP
5.0000 mL | ORAL_SOLUTION | Freq: Four times a day (QID) | ORAL | Status: DC | PRN
  Administered 2020-10-26: 10 mL via ORAL
  Filled 2020-10-25: qty 10

## 2020-10-25 MED ORDER — BISACODYL 5 MG PO TBEC
10.0000 mg | DELAYED_RELEASE_TABLET | Freq: Every day | ORAL | Status: DC
  Administered 2020-10-26 – 2020-11-04 (×10): 10 mg via ORAL
  Filled 2020-10-25 (×10): qty 2

## 2020-10-25 MED ORDER — DIPHENHYDRAMINE HCL 12.5 MG/5ML PO ELIX: 12.5000 mg | ORAL_SOLUTION | Freq: Four times a day (QID) | ORAL | Status: DC | PRN

## 2020-10-25 MED ORDER — ENOXAPARIN SODIUM 40 MG/0.4ML ~~LOC~~ SOLN
40.0000 mg | SUBCUTANEOUS | Status: DC
  Administered 2020-10-25 – 2020-11-03 (×10): 40 mg via SUBCUTANEOUS
  Filled 2020-10-25 (×10): qty 0.4

## 2020-10-25 MED ORDER — BISACODYL 10 MG RE SUPP
10.0000 mg | Freq: Every day | RECTAL | Status: DC
  Filled 2020-10-25 (×4): qty 1

## 2020-10-25 MED ORDER — ACETAMINOPHEN 325 MG PO TABS: 325.0000 mg | ORAL_TABLET | ORAL | Status: DC | PRN

## 2020-10-25 MED ORDER — ASPIRIN 81 MG PO CHEW
81.0000 mg | CHEWABLE_TABLET | Freq: Every day | ORAL | Status: DC
  Administered 2020-10-26 – 2020-11-04 (×10): 81 mg via ORAL
  Filled 2020-10-25 (×10): qty 1

## 2020-10-25 MED ORDER — POTASSIUM CHLORIDE CRYS ER 20 MEQ PO TBCR
20.0000 meq | EXTENDED_RELEASE_TABLET | Freq: Two times a day (BID) | ORAL | Status: DC
  Administered 2020-10-25 – 2020-10-26 (×3): 20 meq via ORAL
  Filled 2020-10-25 (×3): qty 1

## 2020-10-25 MED ORDER — PROCHLORPERAZINE MALEATE 5 MG PO TABS: 5.0000 mg | ORAL_TABLET | Freq: Four times a day (QID) | ORAL | Status: DC | PRN

## 2020-10-25 MED ORDER — TRAMADOL HCL 50 MG PO TABS: 50.0000 mg | ORAL_TABLET | ORAL | Status: DC | PRN

## 2020-10-25 MED ORDER — NITROGLYCERIN 0.4 MG SL SUBL: 0.4000 mg | SUBLINGUAL_TABLET | SUBLINGUAL | Status: DC | PRN

## 2020-10-25 MED ORDER — METOPROLOL TARTRATE 25 MG PO TABS
25.0000 mg | ORAL_TABLET | Freq: Two times a day (BID) | ORAL | Status: DC
  Administered 2020-10-25 – 2020-11-02 (×16): 25 mg
  Filled 2020-10-25 (×16): qty 1

## 2020-10-25 MED ORDER — FLUTICASONE FUROATE-VILANTEROL 200-25 MCG/INH IN AEPB
1.0000 | INHALATION_SPRAY | Freq: Every day | RESPIRATORY_TRACT | Status: DC
  Administered 2020-10-27 – 2020-11-04 (×8): 1 via RESPIRATORY_TRACT
  Filled 2020-10-25: qty 28

## 2020-10-25 MED ORDER — PROCHLORPERAZINE EDISYLATE 10 MG/2ML IJ SOLN: 5.0000 mg | Freq: Four times a day (QID) | INTRAMUSCULAR | Status: DC | PRN

## 2020-10-25 MED ORDER — FUROSEMIDE 10 MG/ML IJ SOLN
40.0000 mg | Freq: Every day | INTRAMUSCULAR | Status: DC
  Filled 2020-10-25: qty 4

## 2020-10-25 MED ORDER — FLEET ENEMA 7-19 GM/118ML RE ENEM: 1.0000 | ENEMA | Freq: Once | RECTAL | Status: DC | PRN

## 2020-10-25 MED ORDER — PROSOURCE TF PO LIQD
45.0000 mL | Freq: Four times a day (QID) | ORAL | Status: DC
  Administered 2020-10-25 – 2020-10-26 (×2): 45 mL
  Filled 2020-10-25 (×2): qty 45

## 2020-10-25 MED ORDER — ORAL CARE MOUTH RINSE
15.0000 mL | Freq: Two times a day (BID) | OROMUCOSAL | Status: DC
  Administered 2020-10-25 – 2020-11-04 (×14): 15 mL via OROMUCOSAL

## 2020-10-25 MED ORDER — SENNOSIDES-DOCUSATE SODIUM 8.6-50 MG PO TABS
1.0000 | ORAL_TABLET | Freq: Every evening | ORAL | Status: DC | PRN
Start: 2020-10-25 — End: 2020-11-04

## 2020-10-25 MED ORDER — INSULIN ASPART 100 UNIT/ML ~~LOC~~ SOLN: 0.0000 [IU] | Freq: Three times a day (TID) | SUBCUTANEOUS | Status: DC

## 2020-10-25 MED ORDER — BISACODYL 10 MG RE SUPP
10.0000 mg | Freq: Every day | RECTAL | Status: DC | PRN
Start: 2020-10-25 — End: 2020-11-04

## 2020-10-25 NOTE — Plan of Care (Signed)

## 2020-10-25 NOTE — Discharge Summary (Signed)
[address]       [place] [address]             [phone]    Physician Discharge Summary  Patient ID: Jonathan [name] MRN: [id number] DOB/AGE: [birth year] 70 y.o.  Admit date: 10/14/2020 Discharge date: 10/25/2020  Admission Diagnoses:  Patient Active Problem List   Diagnosis Date Noted  . Tachypnea   . ETOH abuse   . Tobacco abuse   . Essential hypertension   . Chronic obstructive pulmonary disease ([place])   . Coronary artery disease involving native coronary artery of native heart with unstable angina pectoris ([place])   . Cigarette smoker 04/17/2018  . DOE (dyspnea on exertion) 04/16/2018  . Chronic sinusitis of both maxillary sinuses 04/16/2018  . COPD GOLD II   active smoker 04/16/2018  . Obesity (BMI 30.0-34.9) 12/16/2015  . Tobacco abuse counseling 08/05/2014  . 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 Essential hypertension, benign 09/23/2012  . NSTEMI (non-ST elevated myocardial infarction) ([place]) 09/23/2012  . Hyperlipidemia 09/23/2012  . Hernia, inguinal 09/23/2012   Discharge Diagnoses:  Patient Active Problem List   Diagnosis Date Noted  . Cerebral embolism with cerebral infarction 10/21/2020  . History of open heart surgery   . Tachypnea   . ETOH abuse   . Tobacco abuse   . Essential hypertension   . Chronic obstructive pulmonary disease ([place])   . 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 Coronary artery disease involving native coronary artery of native heart with unstable angina pectoris ([place])   . Cigarette smoker 04/17/2018  . DOE (dyspnea on exertion) 04/16/2018  . Chronic sinusitis of both maxillary sinuses 04/16/2018  . COPD GOLD II   active smoker 04/16/2018  . Obesity (BMI 30.0-34.9) 12/16/2015  . Tobacco abuse counseling 08/05/2014  . 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 Essential hypertension, benign 09/23/2012  . NSTEMI (non-ST elevated myocardial infarction) ([place]) 09/23/2012  . Hyperlipidemia 09/23/2012  . Hernia, inguinal  09/23/2012   Discharged Condition: good     HPI:   This is 70 yo man with known severe multivessel CAD who had previously declined CABG (11/20) after presenting with AMI represented now with increased exertional CP and SOB. He states he "can't do anything" without experiencing pain. He has also noted new-onset bilateral pedal edema. He underwent repeat LHC 10/14/20 showing severe CAD including in-stent restenosis of RCA territory. Consult received for CABG. He stopped Plavix 10/10/20; his PMHx is remarkable for tobacco abuse, hypercholesterolemia, and HTN. He is intolerant of lipid lowering therapy.  Hospital Course:   Mr. Teryl Lucy is a 70 year old male patient who underwent a coronary artery bypass grafting x4 on 10/15/2020 with Dr. Orvan Seen.  He tolerated the procedure well and was transferred to the surgical ICU for continued care.  During his stay in the SICU the patient's IABP was removed on POD #1.  He was weaned and extubated on 10/17/2020.  The patient was weaned off drips as hemodynamics allowed.  His chest tube and arterial lines were removed without difficulty.  His chest tubes and arterial lines were removed without difficulty.  The patient developed difficulty swallowing and coughing with medications.  CT scan of the head was obtained and showed evidence of a small infarct in the left posterior cerebellar hemisphere.  The age was indeterminate but was felt possible to be acute vs subacute.  He was made NPO and underwent SLP evaluation which showed evidence of moderate risk of aspiration.  He had an  NG tube placed and was started on supplemental feedings.  Neurology consult was obtained and they recommended MRA of brain for further workup.  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  The patient repeatedly stated he wants to be a DNR.  Palliative care consult was  requested to assist with ensuring patient's goals are met.  The patient wished to be made comfort care.  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  The patient was made DNR and will continue to recover as able.  He underwent a Modified Barium Swallow on 2/9.  He was felt to be able to tolerate Dysphagia 2 diet.  Per neurology recommendations the patient was started on Plavix.  The patient continued to make progress.  He was evaluated by PT/OT who recommended CIR placement.  Consult was placed and insurance authorization was initiated.  The patient remained clinically stable.  He remained in NSR and his pacing wires were removed without difficulty.  He continued to work with PT/OT and SLP.  His surgical incisions were healing without evidence of infection.  Per speech pathology's most recent note: Diet recommendations: Dysphagia 2 (fine chop);Honey-thick liquid Liquids provided via: Cup Medication Administration: Whole meds with puree Supervision: Patient able to self feed;Intermittent supervision to cue for compensatory strategies Compensations: Slow rate;Small sips/bites Postural Changes and/or Swallow Maneuvers: Seated upright 90 degrees Speech pathology and nutrition should re evaluate in next several days.  Regarding diuresis, he is still several pounds above his pre op weight. I have ordered Lasix 40 mg daily for next 2 days and he will likely need a potassium supplement via tube. He may be able to be changed to oral Lasix if has good diuresis over the next 48 hours.  He was felt to be medically stable for discharge to CIR today.  Consults: neurology and Palliative care, PT/OT, SLP  Significant Diagnostic Studies:   Angiography:    1st Diag lesion is 90% stenosed.  Mid LAD lesion is 70% stenosed.  Prox RCA to Mid RCA lesion is 85% stenosed.  Prox RCA lesion is 80% stenosed with 30% stenosed side branch in RV Branch.  Mid RCA to  Dist RCA lesion is 70% stenosed.  Ost LM lesion is 45% stenosed.  Ost RCA to Prox RCA lesion is 90% stenosed.  Ost Cx lesion is 99% stenosed.   Severe multivessel progressive CAD with 40 to 50% ostial left main stenosis, diffuse 95% stenosis in the first diagonal branch of the LAD with 70% LAD stenosis beyond this diagonal vessel; 99% ostial left circumflex stenosis and progressive 85% in-stent restenosis in a dominant RCA with 90% stenosis at the initiation of the proximal stent with 70% stenosis proximal to the AV groove.  Relatively preserved LV function with EF estimated at 50 to 55%.  LVEDP 22 mm.   MR BRAIN:  MRI HEAD IMPRESSION:  1. Patchy small volume early subacute ischemic infarcts involving the left cerebellar hemisphere, corresponding with abnormality on prior CT. No associated hemorrhage or mass effect. 2. Otherwise normal brain MRI for age.  MRA HEAD IMPRESSION:  1. Technically limited exam due to motion artifact. 2. Negative intracranial MRA for large vessel occlusion. 3. Question focal severe proximal right M2 stenosis versus artifact. No definite proximal high-grade or correctable stenosis identified.  MRA NECK IMPRESSION:  1.  Technically limited exam due to motion artifact. 2. Mild atheromatous irregularity about the carotid bifurcations/proximal ICAs with no more than mild up to 20-30% stenosis on the left. 3. Wide patency of both vertebral arteries within the neck.  Treatments: surgery:    Coronary Artery Bypass Grafting x 4             Left Internal Mammary Artery to Distal Left Anterior Descending Coronary Artery; pedicled right internal mammary artery to right posterior lateral coronary Artery; Saphenous Vein Graft to first obtuse Marginal Branch of Left Circumflex Coronary Artery; Sapheonous Vein Graft to first diagonal Branch Coronary Artery; Endoscopic Vein Harvest from right thigh and Lower Leg Bilateral internal mammary artery  harvesting Completion graft surveillance with indocyanine green fluorescence angiography Rigid sternal reconstruction with linear plating system Insertion of intra-aortic balloon pump under TEE guidance via the left common femoral artery  Discharge Exam: Blood pressure 118/78, pulse 87, temperature 98.7 F (37.1 C), temperature source Oral, resp. rate 19, height _0  (1.727 m), weight 100 kg, SpO2 99 %. General appearance: alert Neurologic: intact Heart: regular rate and rhythm, S1, S2 normal, no murmur, click, rub or gallop Lungs: clear to auscultation bilaterally Abdomen: soft, non-tender; bowel sounds normal; no masses,  no organomegaly Extremities: extremities normal, atraumatic, no cyanosis or edema   Discharge Medications:  The patient has been discharged on:   1.Beta Blocker:  Yes [ X  ]                              No   [   ]                              If No, reason:  2.Ace Inhibitor/ARB: Yes [   ]                                     No  [  x  ]                                     If No, reason: Labile BP  3.Statin:   Yes [   ]                  No  [ X  ]                  If No, reason: intolerance, elevated LFTs  4.Ecasa:  Yes  [ X  ]                  No   [   ]                  If No, reason:    Discharge Instructions    Ambulatory referral to Neurology   Complete by: As directed    Follow up with stroke clinic in about 4 weeks. Thanks.     Allergies as of 10/25/2020      Reactions   Niaspan [niacin Er] Itching, Other (See Comments)   insomnia   Statins    LFT elevavation, CK elevevation      Medication List    STOP taking these medications   amLODipine 5 MG tablet Commonly  known as: NORVASC   aspirin EC 81 MG tablet Replaced by: aspirin 81 MG chewable tablet   furosemide 20 MG tablet Commonly known as: LASIX Replaced by: furosemide 10 MG/ML injection   isosorbide mononitrate 60 MG 24 hr tablet Commonly known as: IMDUR   metoprolol  succinate 25 MG 24 hr tablet Commonly known as: TOPROL-XL   ramipril 10 MG capsule Commonly known as: ALTACE   ranolazine 1000 MG SR tablet Commonly known as: RANEXA   Repatha SureClick 440 MG/ML Soaj Generic drug: Evolocumab     TAKE these medications   albuterol 108 (90 Base) MCG/ACT inhaler Commonly known as: VENTOLIN HFA Inhale 2 puffs into the lungs every 4 (four) hours as needed for wheezing or shortness of breath (cough, shortness of breath or wheezing.).   aspirin 81 MG chewable tablet Place 1 tablet (81 mg total) into feeding tube daily. Replaces: aspirin EC 81 MG tablet   budesonide-formoterol 160-4.5 MCG/ACT inhaler Commonly known as: Symbicort Inhale 2 puffs into the lungs 2 (two) times daily. What changed:   when to take this  reasons to take this   clopidogrel 75 MG tablet Commonly known as: PLAVIX Place 1 tablet (75 mg total) into feeding tube daily. What changed: how to take this   colchicine 0.6 MG tablet Place 1 tablet (0.6 mg total) into feeding tube 2 (two) times daily. What changed:   how to take this  when to take this  reasons to take this   feeding supplement (PROSource TF) liquid Place 45 mLs into feeding tube 4 (four) times daily.   feeding supplement (VITAL 1.5 CAL) Liqd Place 1,000 mLs into feeding tube daily.   furosemide 10 MG/ML injection Commonly known as: LASIX Inject 4 mLs (40 mg total) into the vein daily for 2 days. Replaces: furosemide 20 MG tablet   metoprolol tartrate 25 MG tablet Commonly known as: LOPRESSOR Place 1 tablet (25 mg total) into feeding tube 2 (two) times daily.   nicotine 21 mg/24hr patch Commonly known as: NICODERM CQ - dosed in mg/24 hours Place 1 patch (21 mg total) onto the skin daily.   nitroGLYCERIN 0.4 MG SL tablet Commonly known as: NITROSTAT PLACE 1 TABLET UNDER THE TONGUE EVERY 5 MINUTES AS NEEDED FOR CHEST PAIN UP TO 3 DOSES, IF SYMPTOMS PERSIST CALL 911 What changed: See the new  instructions.   pantoprazole sodium 40 mg/20 mL Pack Commonly known as: PROTONIX Place 20 mLs (40 mg total) into feeding tube daily.   phenol 1.4 % Liqd Commonly known as: CHLORASEPTIC Use as directed 1 spray in the mouth or throat as needed for throat irritation / pain.   traMADol 50 MG tablet Commonly known as: ULTRAM Place 1 tablet (50 mg total) into feeding tube every 6 (six) hours as needed for severe pain.       Follow-up Information    [name], MD .   Specialty: Cardiology Contact information: [address] [place] [address] [phone]        [facility]. Schedule an appointment as soon as possible for a visit in 4 week(s).   Specialty: Neurology Contact information: [address] [place] [phone]       [name], MD Follow up.   Specialty: Cardiothoracic Surgery Contact information: [address] [place] [address] [phone]             Please note, I have never seen this  patient and only reconciled medications and updated speech pathology's recommendations  Signed: Lars Pinks PA-C 10/25/2020, 8:45 AM

## 2020-10-25 NOTE — TOC Transition Note (Signed)
Transition of Care (TOC) - CM/SW Discharge Note Marvetta Gibbons RN, BSN Transitions of Care Unit 4E- RN Case Manager See Treatment Team for direct phone # Weekend cross coverage   Patient Details  Name: ALGER KERSTEIN MRN: [id number] Date of Birth: Jul 15, 1951  Transition of Care [facility]) CM/SW Contact:  Dawayne Patricia, RN Phone Number: 10/25/2020, 10:55 AM   Clinical Narrative:    Pt stable for transition to [facility] rehab, notified this am that pt has a bed for [facility] today and insurance approval. Plan to transition later today to [facility].    Final next level of care: IP Rehab Facility Barriers to Discharge: No Barriers Identified   Patient Goals and CMS Choice     rehab    Discharge Placement               [facility]        Discharge Plan and Services     Post Acute Care Choice: IP Rehab                               Social Determinants of Health (SDOH) Interventions     Readmission Risk Interventions Readmission Risk Prevention Plan 10/25/2020  Transportation Screening Complete  PCP or Specialist Appt within 5-7 Days Not Complete  Not Complete comments pt going to [facility] Screening Complete  Medication Review (RN CM) Complete  Some recent data might be hidden

## 2020-10-25 NOTE — Progress Notes (Signed)
Expand All Collapse All             Physical Medicine and Rehabilitation Consult   Reason for Consult: Functional deficits due to debility/stroke.   Referring Physician: Dr. Orvan Seen.    HPI: Jonathan [name] is a 70 y.o. male with history of HTN, COPD, CAD--had refused CABG last year but continued to decline with progressive weakness, DOE and BLE pain. He reports that he could not walk household distances without SOB or CP.  History taken from chart review, wife, and patient.  He was admitted on 10/14/2020 for cath revealing severe CAD with instent restenosis and underwent CABG x4 by Dr. Julien Girt on 10/15/2020.  Post op fluid overload treated with diuresis. He had had issues with confusion as well as productive cough with hypoxia/DOE with minimal activity.  Nurse reports that patient had decline in mental status with difficutly swallowing yesterday which precipitated CT head.  CT head revealed age indeterminate left posterior cerebellar acute/subacute infarct--neurology recommending stroke work-up ongoing. BSS done revealing increased WOB with po trials and odynophagia--NPO recommended and cortack placed for supplemental feeding. Therapy evaluations pending. CIR recommended due to issues with premorbid debility and anticipation of extensive rehab needs.  Patient is irritated and refusing inpatient rehab, however wife is concerned and tearful stating that she cannot take care of patient alone at home.  No other caregiver support available. Wife notes significant Etoh use.    Review of Systems  Unable to perform ROS: Other  Agitated, positive for all questions asked       Past Medical History:  Diagnosis Date  . Arthritis      hands   . Chronic bronchitis ([place])    . Chronic sinusitis of both maxillary sinuses 04/16/2018    Sinus CT 04/25/2018 Mild mucoperiosteal thickening involving the frontoethmoidal sinuses. Otherwise largely clear sinuses with no evidence for acute sinusitis.   [name] COPD GOLD II    active smoker 04/16/2018    Spirometry 04/16/2018  FEV1 1.43 (47%)  Ratio 62  s prior rx  - 04/16/2018    try symbicort 160 2bid - PFT's  06/08/2018  FEV1 2.03 (71 % ) ratio 61  p 5 % improvement from saba p nothing prior to study with DLCO  63 % corrects to 72  % for alv volume    -  06/08/2018  After extensive coaching inhaler device,  effectiveness =    75%  (short ti/ variable flow) > continue symbicort 160 2bid   . Coronary artery disease    . Coronary artery disease involving native coronary artery of native heart with unstable angina pectoris ([place])    . DOE (dyspnea on exertion) 04/16/2018  . Essential hypertension, benign 09/23/2012    Changed acei to arb 04/16/2018 due to pseudowheeze > improved 06/08/2018   . Hernia, inguinal 09/23/2012  . Hyperlipidemia      has not tolerated statins,  would not pursue PSK9 inhibitor as advised by Dr Claiborne Billings  . Hypertension    . Myocardial infarction ([place])    . NSTEMI (non-ST elevated myocardial infarction) ([place]) 09/23/2012  . 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 Tobacco abuse             Past Surgical History:  Procedure Laterality Date  . CARDIAC CATHETERIZATION      . CARDIOVASCULAR STRESS TEST   11/11/2011  . CORONARY ANGIOPLASTY        stents- 01/2004   . CORONARY ARTERY BYPASS GRAFT N/A 10/15/2020  Procedure: CORONARY ARTERY BYPASS GRAFTING (CABG) TIMES FOUR USING BILATERAL INTERNAL MAMMARY ARTERIES AND ENDOSCOPICALLY HARVESTED GREATER SAPHENOUS VEIN AND INSERTION OF 50CC INTRAAORTIC BALLOON PUMP;  Surgeon: [name], MD;  Location: [facility];  Service: Open Heart Surgery;  Laterality: N/A;  possible BIMA  . ENDOVEIN HARVEST OF GREATER SAPHENOUS VEIN Right 10/15/2020    Procedure: ENDOVEIN HARVEST OF GREATER SAPHENOUS VEIN;  Surgeon: [name], MD;  Location: [facility];  Service: Open Heart Surgery;  Laterality: Right;  . heart stents   01/2004  . INGUINAL HERNIA REPAIR Right 12/06/2012    Procedure: HERNIA REPAIR INGUINAL ADULT;  Surgeon: [name], DO;  Location: [facility];  Service: General;  Laterality: Right;  . INSERTION OF MESH Right 12/06/2012    Procedure: INSERTION OF MESH;  Surgeon: [name], DO;  Location: [facility];  Service: General;  Laterality: Right;  . LEFT HEART CATH AND CORONARY ANGIOGRAPHY N/A 07/22/2019    Procedure: LEFT HEART CATH AND CORONARY ANGIOGRAPHY;  Surgeon: [name], MD;  Location: [facility];  Service: Cardiovascular;  Laterality: N/A;  . LEFT HEART CATH AND CORONARY ANGIOGRAPHY N/A 10/14/2020    Procedure: LEFT HEART CATH AND CORONARY ANGIOGRAPHY;  Surgeon: [name], MD;  Location: [facility];  Service: Cardiovascular;  Laterality: N/A;  . TEE WITHOUT CARDIOVERSION N/A 10/15/2020    Procedure: TRANSESOPHAGEAL ECHOCARDIOGRAM (TEE);  Surgeon: [name], MD;  Location: [facility];  Service: Open Heart Surgery;  Laterality: N/A;           Family History  Problem Relation Age of Onset  . CAD Father          CABG with subsequent stroke      Social History:  Married. Independent without AD. Retired from [employer] work. He reports that he has been smoking cigarettes--I PPD. He has a 53.00 pack-year smoking history. He has never used smokeless tobacco. He reports current alcohol use of about 3 beers/day. He reports that he does not use drugs.          Allergies  Allergen Reactions  . Niaspan [Niacin Er] Itching and Other (See Comments)      insomnia  . Statins        LFT elevavation, CK elevevation            Medications Prior to Admission  Medication Sig Dispense Refill  . albuterol (PROVENTIL HFA;VENTOLIN HFA) 108 (90 Base) MCG/ACT inhaler Inhale 2 puffs into the lungs every 4 (four) hours as needed for wheezing or shortness of breath (cough, shortness of breath or wheezing.). 1 Inhaler 5  . amLODipine (NORVASC) 5 MG tablet Take 1 tablet (5 mg total) by mouth daily. 30 tablet 6  . aspirin EC 81 MG tablet Take 81 mg by mouth every morning.      .  budesonide-formoterol (SYMBICORT) 160-4.5 MCG/ACT inhaler Inhale 2 puffs into the lungs 2 (two) times daily. (Patient taking differently: Inhale 2 puffs into the lungs 2 (two) times daily as needed (asthma).) 1 each 11  . clopidogrel (PLAVIX) 75 MG tablet TAKE 1 TABLET BY MOUTH  DAILY (Patient taking differently: Take 75 mg by mouth daily.) 90 tablet 3  . furosemide (LASIX) 20 MG tablet Take 1 tablet (20 mg total) by mouth daily as needed (SWELLING). 30 tablet 6  . isosorbide mononitrate (IMDUR) 60 MG 24 hr tablet Take to 2 tablet ( 120 mg) in the morning and  1 tablet (60 mg )  at bedtime (Patient taking differently: Take 60-120 mg by mouth See admin instructions. Take to 2 tablet ( 120 mg) in the morning and  1 tablet (60 mg ) at bedtime) 90 tablet 6  . metoprolol succinate (TOPROL-XL) 25 MG 24 hr tablet Take 1.5 tablets (37.5 mg total) by mouth daily. 135 tablet 1  . nitroGLYCERIN (NITROSTAT) 0.4 MG SL tablet PLACE 1 TABLET UNDER THE TONGUE EVERY 5 MINUTES AS NEEDED FOR CHEST PAIN UP TO 3 DOSES, IF SYMPTOMS PERSIST CALL 911 (Patient taking differently: Place 0.4 mg under the tongue every 5 (five) minutes as needed for chest pain.) 25 tablet 6  . ramipril (ALTACE) 10 MG capsule TAKE 1 CAPSULE BY MOUTH  DAILY (Patient taking differently: Take 10 mg by mouth daily.) 90 capsule 3  . ranolazine (RANEXA) 1000 MG SR tablet Take 1 tablet (1,000 mg total) by mouth 2 (two) times daily. 60 tablet 11  . colchicine 0.6 MG tablet Take 0.6 mg by mouth 2 (two) times daily as needed (gout).      . Evolocumab (REPATHA SURECLICK) 834 MG/ML SOAJ Inject 140 mg into the skin every 14 (fourteen) days. (Patient not taking: No sig reported) 2 pen 11      Home: [place] expects to be discharged to:: Private residence Living Arrangements: Spouse/significant other Available Help at Discharge: Family,Available 24 hours/day Type of Home: House Home Access: Stairs to enter [employer] of Steps:  couple Entrance Stairs-Rails: Right,Left Home Layout: One level Bathroom Shower/Tub: Chiropodist:  (comfort leve) Home Equipment: Cane - single point,Crutches  Functional History: Prior Function Level of Independence: Independent Functional Status:  Mobility: Bed Mobility Overal bed mobility: Needs Assistance Bed Mobility: Supine to Sit,Sit to Supine Supine to sit: Mod assist Sit to supine: Mod assist General bed mobility comments: min assist for controlled Transfers Overall transfer level: Needs assistance Transfers: Sit to/from Stand Sit to Stand: Min assist General transfer comment: cues for sternal precautions, "stay in the tube", hands in your lap"  min stability assist Ambulation/Gait Ambulation/Gait assistance: Min assist,Mod assist (light mod as he fatigued.) Gait Distance (Feet): 48 Feet Assistive device: Rolling walker (2 wheeled) Gait Pattern/deviations: Step-through pattern General Gait Details: pt with mildly flexed posture, progressively getting further behind the RW until cued otherwise.  heavier use of the RW as he noticeably fatigued.  Overall, pt unable to control the RW to stay straight.  RW consistently veered to the left. Gait velocity: slower Gait velocity interpretation: <1.8 ft/sec, indicate of risk for recurrent falls   ADL:   Cognition: Cognition Overall Cognitive Status: Within Functional Limits for tasks assessed Orientation Level: Oriented X4 Cognition Arousal/Alertness: Awake/alert Behavior During Therapy: WFL for tasks assessed/performed Overall Cognitive Status: Within Functional Limits for tasks assessed   Blood pressure (!) 121/59, pulse 75, temperature 98.1 F (36.7 C), temperature source Oral, resp. rate 18, height _0  (1.727 m), weight 104.2 kg, SpO2 100 %. Physical Exam Vitals and nursing note reviewed.  Constitutional:      General: He is in acute distress.     Appearance: He is obese.     Interventions:  Nasal cannula in place.     Comments: Pale, ill appearing obese male with cortak in place. Did arouse briefly for exam and needed cues to keep eyes awake. Frustrated by exam and repeated "Knock it off" " "DNR" multiple times despite attempts to redirect.   HENT:     Head: Normocephalic and atraumatic.     Comments: +  NG    Right Ear: External ear normal.     Left Ear: External ear normal.     Nose: Nose normal.  Eyes:     General:        Right eye: No discharge.        Left eye: No discharge.     Comments: Keeps eyes closed  Cardiovascular:     Rate and Rhythm: Normal rate and regular rhythm.  Pulmonary:     Effort: No respiratory distress.     Breath sounds: Wheezing present.     Comments: +  Abdominal:     General: Bowel sounds are normal. There is distension.     Tenderness: There is no abdominal tenderness.  Musculoskeletal:     Cervical back: Normal range of motion and neck supple.     Comments: Generalized edema  Skin:    General: Skin is warm and dry.     Comments: Left hand cool to touch.   Neurological:     Mental Status: He is alert, oriented to person, place, and time and easily aroused.     Comments: He was able to follow simple motor commands  Sensation appears intact.  Motor: Grossly 4/5 throughout (limited due to effort)  Psychiatric:        Mood and Affect: Affect is angry.        Behavior: Behavior is agitated.        Lab Results Last 24 Hours       Results for orders placed or performed during the hospital encounter of 10/14/20 (from the past 24 hour(s))  Lipid panel     Status: Abnormal    Collection Time: 10/20/20  2:24 PM  Result Value Ref Range    Cholesterol 111 0 - 200 mg/dL    Triglycerides 119 <150 mg/dL    HDL 35 (L) >40 mg/dL    Total CHOL/HDL Ratio 3.2 RATIO    VLDL 24 0 - 40 mg/dL    LDL Cholesterol 52 0 - 99 mg/dL  Glucose, capillary     Status: Abnormal    Collection Time: 10/20/20  3:18 PM  Result Value Ref Range     Glucose-Capillary 108 (H) 70 - 99 mg/dL  Magnesium     Status: None    Collection Time: 10/20/20  4:32 PM  Result Value Ref Range    Magnesium 2.2 1.7 - 2.4 mg/dL  Phosphorus     Status: None    Collection Time: 10/20/20  4:32 PM  Result Value Ref Range    Phosphorus 2.6 2.5 - 4.6 mg/dL  Glucose, capillary     Status: Abnormal    Collection Time: 10/20/20  7:50 PM  Result Value Ref Range    Glucose-Capillary 117 (H) 70 - 99 mg/dL  Glucose, capillary     Status: Abnormal    Collection Time: 10/20/20 11:17 PM  Result Value Ref Range    Glucose-Capillary 122 (H) 70 - 99 mg/dL  Cooxemetry Panel (carboxy, met, total hgb, O2 sat)     Status: Abnormal    Collection Time: 10/21/20  3:26 AM  Result Value Ref Range    Total hemoglobin 10.8 (L) 12.0 - 16.0 g/dL    O2 Saturation 58.8 %    Carboxyhemoglobin 1.2 0.5 - 1.5 %    Methemoglobin 0.9 0.0 - 1.5 %  Basic metabolic panel     Status: Abnormal    Collection Time: 10/21/20  3:26 AM  Result Value Ref Range  Sodium 144 135 - 145 mmol/L    Potassium 3.6 3.5 - 5.1 mmol/L    Chloride 99 98 - 111 mmol/L    CO2 35 (H) 22 - 32 mmol/L    Glucose, Bld 111 (H) 70 - 99 mg/dL    BUN 28 (H) 8 - 23 mg/dL    Creatinine, Ser 1.06 0.61 - 1.24 mg/dL    Calcium 7.8 (L) 8.9 - 10.3 mg/dL    GFR, Estimated >60 >60 mL/min    Anion gap 10 5 - 15  Magnesium     Status: None    Collection Time: 10/21/20  3:26 AM  Result Value Ref Range    Magnesium 2.1 1.7 - 2.4 mg/dL  Phosphorus     Status: None    Collection Time: 10/21/20  3:26 AM  Result Value Ref Range    Phosphorus 2.6 2.5 - 4.6 mg/dL  Glucose, capillary     Status: None    Collection Time: 10/21/20  3:28 AM  Result Value Ref Range    Glucose-Capillary 97 70 - 99 mg/dL  Glucose, capillary     Status: Abnormal    Collection Time: 10/21/20  7:52 AM  Result Value Ref Range    Glucose-Capillary 115 (H) 70 - 99 mg/dL  Glucose, capillary     Status: Abnormal    Collection Time: 10/21/20 11:10 AM   Result Value Ref Range    Glucose-Capillary 130 (H) 70 - 99 mg/dL       Imaging Results (Last 48 hours)  DG Chest 2 View   Result Date: 10/20/2020 CLINICAL DATA:  Status post cardiac surgery EXAM: CHEST - 2 VIEW COMPARISON:  10/19/2020 FINDINGS: Nasoenteric feeding tube extends into the upper abdomen beyond the margin of the examination. Right upper extremity PICC line tip is seen within the superior vena cava. The lungs are symmetrically well expanded and are clear. Small bilateral pleural effusions are present. No pneumothorax. Cardiac size within normal limits. Coronary artery bypass grafting has been performed. Pulmonary vascularity is normal. IMPRESSION: Small bilateral pleural effusions.  Normal pulmonary vascularity. Electronically Signed   By: Fidela Salisbury MD   On: 10/20/2020 11:01    MR ANGIO HEAD WO CONTRAST   Result Date: 10/20/2020 CLINICAL DATA:  Follow-up examination for acute stroke. EXAM: MRI HEAD WITHOUT CONTRAST MRA HEAD WITHOUT CONTRAST MRA NECK WITHOUT CONTRAST TECHNIQUE: Multiplanar, multiecho pulse sequences of the brain and surrounding structures were obtained without intravenous contrast. Angiographic images of the Circle of Willis were obtained using MRA technique without intravenous contrast. Angiographic images of the neck were obtained using MRA technique without intravenous contrast. Carotid stenosis measurements (when applicable) are obtained utilizing NASCET criteria, using the distal internal carotid diameter as the denominator. COMPARISON:  Prior CT from 10/19/2020 FINDINGS: MRI HEAD FINDINGS Brain: Mild diffuse prominence of the CSF containing spaces compatible with generalized age-related cerebral atrophy. No significant cerebral white matter disease for age. Two separate foci of restricted diffusion seen involving the left cerebellar hemisphere, corresponding with abnormality on prior CT. Largest area measures up to 2.3 cm. Nearly normalized signal loss on  corresponding ADC map, with associated increased T2/FLAIR signal, consistent with early subacute ischemic change. No associated hemorrhage or mass effect. No other diffusion abnormality to suggest acute or subacute ischemia elsewhere within the brain. Gray-white matter differentiation otherwise maintained. No encephalomalacia to suggest chronic cortical infarction elsewhere. No acute intracranial hemorrhage. Few punctate foci of susceptibility artifact noted within the brain, nonspecific, but likely small chronic micro  hemorrhages. Findings suspected to be hypertensive in nature. No mass lesion, midline shift or mass effect. No hydrocephalus or extra-axial fluid collection. Pituitary gland suprasellar region normal. Midline structures intact and normal. Vascular: Major intracranial vascular flow voids are maintained. Skull and upper cervical spine: Craniocervical junction within normal limits. Bone marrow signal intensity normal. No scalp soft tissue abnormality. Sinuses/Orbits: Globes and orbital soft tissues within normal limits. Small retention cyst noted within the right maxillary sinus. Paranasal sinuses are otherwise largely clear. Small left greater than right mastoid effusions, of doubtful significance. Nasogastric tube in place. Inner ear structures grossly normal. Other: None. MRA HEAD FINDINGS ANTERIOR CIRCULATION: Examination degraded by motion artifact. Visualized distal cervical segments of the internal carotid arteries are patent with antegrade flow. Petrous segments patent bilaterally. Probable scattered mild atheromatous irregularity within the carotid siphons without definite flow-limiting stenosis. A1 segments patent bilaterally. Normal anterior communicating artery complex. Anterior cerebral arteries grossly patent to their distal aspects without appreciable stenosis. No definite M1 stenosis. Left MCA bifurcates early. There is question of a focal severe proximal right M2 stenosis (series 1037,  image 12). This could be artifactual nature on this degraded exam. Distal MCA branches otherwise perfused and grossly symmetric. POSTERIOR CIRCULATION: Vertebral arteries are largely code dominant and patent to the vertebrobasilar junction without stenosis. Both PICA origins patent and normal. Basilar widely patent proximally. Apparent moderate defect at the distal basilar artery favored to be artifactual in nature (series 1043, image 10). Superior cerebellar arteries patent proximally. Both PCAs primarily supplied via the basilar. PCAs grossly perfused to their distal aspects without definite stenosis. No visible aneurysm or other vascular abnormality. MRA NECK FINDINGS AORTIC ARCH: Examination technically limited by lack of IV contrast and motion artifact. Aortic arch and origin of the great vessels not visualized or assessed on this exam. RIGHT CAROTID SYSTEM: Visualized right CCA patent without stenosis. No more than mild atheromatous irregularity or stenosis about the right bifurcation. Right ICA otherwise patent without stenosis or evidence for dissection. LEFT CAROTID SYSTEM: Visualized left CCA patent without stenosis. No more than mild 20-30% stenosis about the left bifurcation/proximal left ICA. Left ICA patent distally without stenosis or evidence for dissection. VERTEBRAL ARTERIES: Both vertebral arteries appear to arise from the subclavian arteries. Proximal aspects of the vertebral arteries are not well assessed on this limited exam. Visualized portions of the vertebral arteries patent without stenosis or evidence for dissection. IMPRESSION: MRI HEAD IMPRESSION: 1. Patchy small volume early subacute ischemic infarcts involving the left cerebellar hemisphere, corresponding with abnormality on prior CT. No associated hemorrhage or mass effect. 2. Otherwise normal brain MRI for age. MRA HEAD IMPRESSION: 1. Technically limited exam due to motion artifact. 2. Negative intracranial MRA for large vessel  occlusion. 3. Question focal severe proximal right M2 stenosis versus artifact. No definite proximal high-grade or correctable stenosis identified. MRA NECK IMPRESSION: 1. Technically limited exam due to motion artifact. 2. Mild atheromatous irregularity about the carotid bifurcations/proximal ICAs with no more than mild up to 20-30% stenosis on the left. 3. Wide patency of both vertebral arteries within the neck. Electronically Signed   By: Jeannine Boga M.D.   On: 10/20/2020 19:38    MR ANGIO NECK WO CONTRAST   Result Date: 10/20/2020 CLINICAL DATA:  Follow-up examination for acute stroke. EXAM: MRI HEAD WITHOUT CONTRAST MRA HEAD WITHOUT CONTRAST MRA NECK WITHOUT CONTRAST TECHNIQUE: Multiplanar, multiecho pulse sequences of the brain and surrounding structures were obtained without intravenous contrast. Angiographic images of the Circle of Willis  were obtained using MRA technique without intravenous contrast. Angiographic images of the neck were obtained using MRA technique without intravenous contrast. Carotid stenosis measurements (when applicable) are obtained utilizing NASCET criteria, using the distal internal carotid diameter as the denominator. COMPARISON:  Prior CT from 10/19/2020 FINDINGS: MRI HEAD FINDINGS Brain: Mild diffuse prominence of the CSF containing spaces compatible with generalized age-related cerebral atrophy. No significant cerebral white matter disease for age. Two separate foci of restricted diffusion seen involving the left cerebellar hemisphere, corresponding with abnormality on prior CT. Largest area measures up to 2.3 cm. Nearly normalized signal loss on corresponding ADC map, with associated increased T2/FLAIR signal, consistent with early subacute ischemic change. No associated hemorrhage or mass effect. No other diffusion abnormality to suggest acute or subacute ischemia elsewhere within the brain. Gray-white matter differentiation otherwise maintained. No encephalomalacia  to suggest chronic cortical infarction elsewhere. No acute intracranial hemorrhage. Few punctate foci of susceptibility artifact noted within the brain, nonspecific, but likely small chronic micro hemorrhages. Findings suspected to be hypertensive in nature. No mass lesion, midline shift or mass effect. No hydrocephalus or extra-axial fluid collection. Pituitary gland suprasellar region normal. Midline structures intact and normal. Vascular: Major intracranial vascular flow voids are maintained. Skull and upper cervical spine: Craniocervical junction within normal limits. Bone marrow signal intensity normal. No scalp soft tissue abnormality. Sinuses/Orbits: Globes and orbital soft tissues within normal limits. Small retention cyst noted within the right maxillary sinus. Paranasal sinuses are otherwise largely clear. Small left greater than right mastoid effusions, of doubtful significance. Nasogastric tube in place. Inner ear structures grossly normal. Other: None. MRA HEAD FINDINGS ANTERIOR CIRCULATION: Examination degraded by motion artifact. Visualized distal cervical segments of the internal carotid arteries are patent with antegrade flow. Petrous segments patent bilaterally. Probable scattered mild atheromatous irregularity within the carotid siphons without definite flow-limiting stenosis. A1 segments patent bilaterally. Normal anterior communicating artery complex. Anterior cerebral arteries grossly patent to their distal aspects without appreciable stenosis. No definite M1 stenosis. Left MCA bifurcates early. There is question of a focal severe proximal right M2 stenosis (series 1037, image 12). This could be artifactual nature on this degraded exam. Distal MCA branches otherwise perfused and grossly symmetric. POSTERIOR CIRCULATION: Vertebral arteries are largely code dominant and patent to the vertebrobasilar junction without stenosis. Both PICA origins patent and normal. Basilar widely patent proximally.  Apparent moderate defect at the distal basilar artery favored to be artifactual in nature (series 1043, image 10). Superior cerebellar arteries patent proximally. Both PCAs primarily supplied via the basilar. PCAs grossly perfused to their distal aspects without definite stenosis. No visible aneurysm or other vascular abnormality. MRA NECK FINDINGS AORTIC ARCH: Examination technically limited by lack of IV contrast and motion artifact. Aortic arch and origin of the great vessels not visualized or assessed on this exam. RIGHT CAROTID SYSTEM: Visualized right CCA patent without stenosis. No more than mild atheromatous irregularity or stenosis about the right bifurcation. Right ICA otherwise patent without stenosis or evidence for dissection. LEFT CAROTID SYSTEM: Visualized left CCA patent without stenosis. No more than mild 20-30% stenosis about the left bifurcation/proximal left ICA. Left ICA patent distally without stenosis or evidence for dissection. VERTEBRAL ARTERIES: Both vertebral arteries appear to arise from the subclavian arteries. Proximal aspects of the vertebral arteries are not well assessed on this limited exam. Visualized portions of the vertebral arteries patent without stenosis or evidence for dissection. IMPRESSION: MRI HEAD IMPRESSION: 1. Patchy small volume early subacute ischemic infarcts involving the left cerebellar  hemisphere, corresponding with abnormality on prior CT. No associated hemorrhage or mass effect. 2. Otherwise normal brain MRI for age. MRA HEAD IMPRESSION: 1. Technically limited exam due to motion artifact. 2. Negative intracranial MRA for large vessel occlusion. 3. Question focal severe proximal right M2 stenosis versus artifact. No definite proximal high-grade or correctable stenosis identified. MRA NECK IMPRESSION: 1. Technically limited exam due to motion artifact. 2. Mild atheromatous irregularity about the carotid bifurcations/proximal ICAs with no more than mild up to  20-30% stenosis on the left. 3. Wide patency of both vertebral arteries within the neck. Electronically Signed   By: Jeannine Boga M.D.   On: 10/20/2020 19:38    MR BRAIN WO CONTRAST   Result Date: 10/20/2020 CLINICAL DATA:  Follow-up examination for acute stroke. EXAM: MRI HEAD WITHOUT CONTRAST MRA HEAD WITHOUT CONTRAST MRA NECK WITHOUT CONTRAST TECHNIQUE: Multiplanar, multiecho pulse sequences of the brain and surrounding structures were obtained without intravenous contrast. Angiographic images of the Circle of Willis were obtained using MRA technique without intravenous contrast. Angiographic images of the neck were obtained using MRA technique without intravenous contrast. Carotid stenosis measurements (when applicable) are obtained utilizing NASCET criteria, using the distal internal carotid diameter as the denominator. COMPARISON:  Prior CT from 10/19/2020 FINDINGS: MRI HEAD FINDINGS Brain: Mild diffuse prominence of the CSF containing spaces compatible with generalized age-related cerebral atrophy. No significant cerebral white matter disease for age. Two separate foci of restricted diffusion seen involving the left cerebellar hemisphere, corresponding with abnormality on prior CT. Largest area measures up to 2.3 cm. Nearly normalized signal loss on corresponding ADC map, with associated increased T2/FLAIR signal, consistent with early subacute ischemic change. No associated hemorrhage or mass effect. No other diffusion abnormality to suggest acute or subacute ischemia elsewhere within the brain. Gray-white matter differentiation otherwise maintained. No encephalomalacia to suggest chronic cortical infarction elsewhere. No acute intracranial hemorrhage. Few punctate foci of susceptibility artifact noted within the brain, nonspecific, but likely small chronic micro hemorrhages. Findings suspected to be hypertensive in nature. No mass lesion, midline shift or mass effect. No hydrocephalus or  extra-axial fluid collection. Pituitary gland suprasellar region normal. Midline structures intact and normal. Vascular: Major intracranial vascular flow voids are maintained. Skull and upper cervical spine: Craniocervical junction within normal limits. Bone marrow signal intensity normal. No scalp soft tissue abnormality. Sinuses/Orbits: Globes and orbital soft tissues within normal limits. Small retention cyst noted within the right maxillary sinus. Paranasal sinuses are otherwise largely clear. Small left greater than right mastoid effusions, of doubtful significance. Nasogastric tube in place. Inner ear structures grossly normal. Other: None. MRA HEAD FINDINGS ANTERIOR CIRCULATION: Examination degraded by motion artifact. Visualized distal cervical segments of the internal carotid arteries are patent with antegrade flow. Petrous segments patent bilaterally. Probable scattered mild atheromatous irregularity within the carotid siphons without definite flow-limiting stenosis. A1 segments patent bilaterally. Normal anterior communicating artery complex. Anterior cerebral arteries grossly patent to their distal aspects without appreciable stenosis. No definite M1 stenosis. Left MCA bifurcates early. There is question of a focal severe proximal right M2 stenosis (series 1037, image 12). This could be artifactual nature on this degraded exam. Distal MCA branches otherwise perfused and grossly symmetric. POSTERIOR CIRCULATION: Vertebral arteries are largely code dominant and patent to the vertebrobasilar junction without stenosis. Both PICA origins patent and normal. Basilar widely patent proximally. Apparent moderate defect at the distal basilar artery favored to be artifactual in nature (series 1043, image 10). Superior cerebellar arteries patent proximally. Both PCAs primarily  supplied via the basilar. PCAs grossly perfused to their distal aspects without definite stenosis. No visible aneurysm or other vascular  abnormality. MRA NECK FINDINGS AORTIC ARCH: Examination technically limited by lack of IV contrast and motion artifact. Aortic arch and origin of the great vessels not visualized or assessed on this exam. RIGHT CAROTID SYSTEM: Visualized right CCA patent without stenosis. No more than mild atheromatous irregularity or stenosis about the right bifurcation. Right ICA otherwise patent without stenosis or evidence for dissection. LEFT CAROTID SYSTEM: Visualized left CCA patent without stenosis. No more than mild 20-30% stenosis about the left bifurcation/proximal left ICA. Left ICA patent distally without stenosis or evidence for dissection. VERTEBRAL ARTERIES: Both vertebral arteries appear to arise from the subclavian arteries. Proximal aspects of the vertebral arteries are not well assessed on this limited exam. Visualized portions of the vertebral arteries patent without stenosis or evidence for dissection. IMPRESSION: MRI HEAD IMPRESSION: 1. Patchy small volume early subacute ischemic infarcts involving the left cerebellar hemisphere, corresponding with abnormality on prior CT. No associated hemorrhage or mass effect. 2. Otherwise normal brain MRI for age. MRA HEAD IMPRESSION: 1. Technically limited exam due to motion artifact. 2. Negative intracranial MRA for large vessel occlusion. 3. Question focal severe proximal right M2 stenosis versus artifact. No definite proximal high-grade or correctable stenosis identified. MRA NECK IMPRESSION: 1. Technically limited exam due to motion artifact. 2. Mild atheromatous irregularity about the carotid bifurcations/proximal ICAs with no more than mild up to 20-30% stenosis on the left. 3. Wide patency of both vertebral arteries within the neck. Electronically Signed   By: Jeannine Boga M.D.   On: 10/20/2020 19:38       Assessment/Plan: Diagnosis: Left posterior cerebellar acute/subacute infarct Stroke: Continue secondary stroke prophylaxis and Risk Factor  Modification listed below:   Antiplatelet therapy:   Blood Pressure Management:  Continue current medication with prn's with permisive HTN per primary team Statin Agent:   Diabetes management:   Tobacco abuse: Counsel Motor recovery: Fluoxetine pending Neuro recs Labs independently reviewed.  Records reviewed and summated above.   1. Does the need for close, 24 hr/day medical supervision in concert with the patient's rehab needs make it unreasonable for this patient to be served in a less intensive setting? Yes  2. Co-Morbidities requiring supervision/potential complications: HTN (monitor and provide prns in accordance with increased physical exertion and pain), COPD (Monitor for signs of respiratory distress. Provide oxygen with goals 88 - 94% if needed. Augment therapies as needed based off endurance. Provide chest physiotherapy as needed - encourage incentive spirometry/acopella), CAD, tachypnea (monitor RR and O2 Sats with increased physical exertion), macrocytic anemia (repeat labs, consider transfusion if necessary to ensure appropriate perfusion for increased activity tolerance), EtOH abuse (CIWA,, monitor for DTs and withdrawal) 3. Due to bladder management, bowel management, safety, skin/wound care, disease management, medication administration, pain management and patient education, does the patient require 24 hr/day rehab nursing? Yes 4. Does the patient require coordinated care of a physician, rehab nurse, therapy disciplines of PT/OT to address physical and functional deficits in the context of the above medical diagnosis(es)? Yes Addressing deficits in the following areas: balance, endurance, locomotion, strength, transferring, bowel/bladder control, bathing, dressing, toileting and psychosocial support 5. Can the patient actively participate in an intensive therapy program of at least 3 hrs of therapy per day at least 5 days per week? Potentially 6. The potential for patient to make  measurable gains while on inpatient rehab is excellent 7. Anticipated functional outcomes  upon discharge from inpatient rehab are supervision  with PT, supervision with OT, n/a with SLP. 8. Estimated rehab length of stay to reach the above functional goals is: 12-15 days. 9. Anticipated discharge destination: Home 10. Overall Rehab/Functional Prognosis: good   RECOMMENDATIONS: This patient's condition is appropriate for continued rehabilitative care in the following setting: Patient currently refusing CIR, wanting to go home. Wife notes significant EtOH use prior to admission-we will monitor closely. Wife also states she cannot care for patient at current level. Recommend CIR if patient is agreeable after completion of medical work-up. Patient has agreed to participate in recommended program. Potentially Note that insurance prior authorization may be required for reimbursement for recommended care.   Comment: Rehab Admissions Coordinator to follow up.   I have personally performed a face to face diagnostic evaluation, including, but not limited to relevant history and physical exam findings, of this patient and developed relevant assessment and plan.  Additionally, I have reviewed and concur with the physician assistant's documentation above.    Delice Lesch, MD, ABPMR Bary Leriche, PA-C 10/21/2020

## 2020-10-25 NOTE — Progress Notes (Signed)
Patient is alert oriented x4, coloration adequate to normal skin complexion, prefers to sleep in recliner next to bed. Surgical wounds intact and w/o drainage,takes his medication and liquids well VSS remains on room air monitoring. Precautionary precautions reinforced to patient prn and states he aware.   0200 Patient up to BR several time with the assistance of the assigned NT, tolerated well, Placed in bed stating recliner was to uncomfortable, made as comfortable as possible, denies pain   0510 Incontinent liquid stool, assisted to BR and ADL, provided, ambulated x 1 assist back to bed, productive cough thick whitish /tan, encouraged CBD incentive spirometry provided, HOB elevated, made as comfortable as possible,Call bell and bed alarm on ,patient denies pain at this time

## 2020-10-25 NOTE — Progress Notes (Signed)
RN transferred patient via wheelchair with patient belongings from room to [id number].  Pt VSS at transfer.

## 2020-10-25 NOTE — H&P (Addendum)
Physical Medicine and Rehabilitation Admission H&P     CC: Stroke/CABG with functional deficits.     HPI: Jonathan [name] is a 70 year old male with history of HTN, COPD, CAD--had refused CABG last year but continues to decline with progressive weakness, DOE and BLE pain.  He reported that he could not walk household distances without shortness of breath or chest pain.  He was admitted on 10/14/2020 for cath revealing severe CAD with in-stent restenosis and underwent CABG x4 by Dr. Julien Girt on 10/15/2020.  Postop had issues with volume overload requiring IV diuresis as well as confusion with productive cough and hypoxia.  He did have decline in mental status with difficulty swallowing 02/07 and CT of head done revealing age-indeterminate left posterior cerebellar acute/subacute infarct.  Bedside swallow showed increased WOB with p.o. trials and odynophagia and he was made n.p.o. with placement of feeding tube from nutritional support.     Neurology consulted for input and recommended MRI/MRI brain for work-up.  This revealed small volume early subacute ischemic infarcts involving left cerebellar hemisphere, question focal severe proximal right MCA stenosis versus artifact and mild left ICA stenosis. 2D echo done showing EF 55 to 60% with evidence of protruding immobile plaque in descending aorta.  Dr. Erlinda Hong felt the stroke was embolic versus cardio procedure related and recommended DAPT as well as 30-day cardiac monitor on outpatient basis to rule out arrhythmia as cause of stroke.  Palliative care consulted to help discuss goals of care patient was requesting to be DNR to multiple providers.  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  Emotional support has been provided by staff and at this time he is agreeable to current interventions.  To alcohol as well as withdrawal from was made DNR multiple times.   As respiratory status improved, MBS performed on 02/09 and patient was started on dysphagia 2 diet with honey liquids. Therapy has been ongoing and patient noted to be limited by weakness, tachycardia, DOE and sternal precautions. CIR recommended due to functional decline.      Review of Systems  Constitutional: Negative for chills and fever.  HENT: Negative for hearing loss.   Respiratory: Positive for shortness of breath and wheezing.   Cardiovascular: Positive for leg swelling. Negative for chest pain.  Gastrointestinal: Negative for constipation and heartburn.  Genitourinary: Negative for dysuria.  Musculoskeletal: Negative for myalgias.  Skin: Negative for rash.  Neurological: Positive for weakness.  Psychiatric/Behavioral: The patient has insomnia. The patient is not nervous/anxious.             Past Medical History:  Diagnosis Date  . Arthritis      hands   . Chronic bronchitis ([place])    . Chronic sinusitis of both maxillary sinuses 04/16/2018    Sinus CT 04/25/2018 Mild mucoperiosteal thickening involving the frontoethmoidal sinuses. Otherwise largely clear sinuses with no evidence for acute sinusitis.   [name] COPD GOLD II   active smoker 04/16/2018    Spirometry 04/16/2018  FEV1 1.43 (47%)  Ratio 62  s prior rx  - 04/16/2018    try symbicort 160 2bid - PFT's  06/08/2018  FEV1 2.03 (71 % ) ratio 61  p 5 % improvement from saba p nothing prior to study with DLCO  63 % corrects to 72  % for alv volume    -  06/08/2018  After extensive coaching inhaler device,  effectiveness =    75%  (short ti/ variable flow) > continue symbicort 160 2bid   . Coronary artery disease    . Coronary artery disease involving native coronary artery of native heart with unstable angina pectoris ([place])    . DOE (dyspnea on exertion) 04/16/2018  . Essential hypertension, benign 09/23/2012    Changed acei to arb 04/16/2018 due to pseudowheeze > improved 06/08/2018   . Hernia, inguinal 09/23/2012  . Hyperlipidemia      has not  tolerated statins,  would not pursue PSK9 inhibitor as advised by Dr Claiborne Billings  . Hypertension    . Myocardial infarction ([place])    . NSTEMI (non-ST elevated myocardial infarction) ([place]) 09/23/2012  . 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 Tobacco abuse             Past Surgical History:  Procedure Laterality Date  . CARDIAC CATHETERIZATION      . CARDIOVASCULAR STRESS TEST   11/11/2011  . CORONARY ANGIOPLASTY        stents- 01/2004   . CORONARY ARTERY BYPASS GRAFT N/A 10/15/2020    Procedure: CORONARY ARTERY BYPASS GRAFTING (CABG) TIMES FOUR USING BILATERAL INTERNAL MAMMARY ARTERIES AND ENDOSCOPICALLY HARVESTED GREATER SAPHENOUS VEIN AND INSERTION OF 50CC INTRAAORTIC BALLOON PUMP;  Surgeon: [name], MD;  Location: [facility];  Service: Open Heart Surgery;  Laterality: N/A;  possible BIMA  . ENDOVEIN HARVEST OF GREATER SAPHENOUS VEIN Right 10/15/2020    Procedure: ENDOVEIN HARVEST OF GREATER SAPHENOUS VEIN;  Surgeon: [name], MD;  Location: [facility];  Service: Open Heart Surgery;  Laterality: Right;  . heart stents   01/2004  . INGUINAL HERNIA REPAIR Right 12/06/2012    Procedure: HERNIA REPAIR INGUINAL ADULT;  Surgeon: [name], DO;  Location: [facility];  Service: General;  Laterality: Right;  . INSERTION OF MESH Right 12/06/2012    Procedure: INSERTION OF MESH;  Surgeon: [name], DO;  Location: [facility];  Service: General;  Laterality: Right;  . LEFT HEART CATH AND CORONARY ANGIOGRAPHY N/A 07/22/2019    Procedure: LEFT HEART CATH AND CORONARY ANGIOGRAPHY;  Surgeon: [name], MD;  Location: [facility];  Service: Cardiovascular;  Laterality: N/A;  . LEFT HEART CATH AND CORONARY ANGIOGRAPHY N/A 10/14/2020    Procedure: LEFT HEART CATH AND CORONARY ANGIOGRAPHY;  Surgeon: [name], MD;  Location: [facility];  Service: Cardiovascular;  Laterality: N/A;  . TEE WITHOUT CARDIOVERSION N/A 10/15/2020    Procedure: TRANSESOPHAGEAL ECHOCARDIOGRAM (TEE);  Surgeon: [name], MD;  Location: [facility];  Service: Open Heart Surgery;  Laterality: N/A;           Family History  Problem Relation Age of Onset  . CAD Father          CABG with subsequent stroke      Social History:  Married. reports that he has been smoking cigarettes--1/2 PPD. He has a 53.00 pack-year smoking history. He has never used smokeless tobacco. He reports current alcohol use--3 beer per day. He reports that he does not use drugs.          Allergies  Allergen Reactions  . Niaspan [Niacin Er] Itching and Other (See Comments)      insomnia  . Statins        LFT elevavation, CK elevevation            Medications Prior to Admission  Medication Sig Dispense Refill  . albuterol (PROVENTIL HFA;VENTOLIN  HFA) 108 (90 Base) MCG/ACT inhaler Inhale 2 puffs into the lungs every 4 (four) hours as needed for wheezing or shortness of breath (cough, shortness of breath or wheezing.). 1 Inhaler 5  . amLODipine (NORVASC) 5 MG tablet Take 1 tablet (5 mg total) by mouth daily. 30 tablet 6  . aspirin EC 81 MG tablet Take 81 mg by mouth every morning.      . budesonide-formoterol (SYMBICORT) 160-4.5 MCG/ACT inhaler Inhale 2 puffs into the lungs 2 (two) times daily. (Patient taking differently: Inhale 2 puffs into the lungs 2 (two) times daily as needed (asthma).) 1 each 11  . clopidogrel (PLAVIX) 75 MG tablet TAKE 1 TABLET BY MOUTH  DAILY (Patient taking differently: Take 75 mg by mouth daily.) 90 tablet 3  . furosemide (LASIX) 20 MG tablet Take 1 tablet (20 mg total) by mouth daily as needed (SWELLING). 30 tablet 6  . isosorbide mononitrate (IMDUR) 60 MG 24 hr tablet Take to 2 tablet ( 120 mg) in the morning and  1 tablet (60 mg ) at bedtime (Patient taking differently: Take 60-120 mg by mouth See admin instructions. Take to 2 tablet ( 120 mg) in the morning and  1 tablet (60 mg ) at bedtime) 90 tablet 6  . metoprolol succinate (TOPROL-XL) 25 MG 24 hr tablet Take 1.5 tablets (37.5 mg total) by mouth  daily. 135 tablet 1  . nitroGLYCERIN (NITROSTAT) 0.4 MG SL tablet PLACE 1 TABLET UNDER THE TONGUE EVERY 5 MINUTES AS NEEDED FOR CHEST PAIN UP TO 3 DOSES, IF SYMPTOMS PERSIST CALL 911 (Patient taking differently: Place 0.4 mg under the tongue every 5 (five) minutes as needed for chest pain.) 25 tablet 6  . ramipril (ALTACE) 10 MG capsule TAKE 1 CAPSULE BY MOUTH  DAILY (Patient taking differently: Take 10 mg by mouth daily.) 90 capsule 3  . ranolazine (RANEXA) 1000 MG SR tablet Take 1 tablet (1,000 mg total) by mouth 2 (two) times daily. 60 tablet 11  . colchicine 0.6 MG tablet Take 0.6 mg by mouth 2 (two) times daily as needed (gout).      . Evolocumab (REPATHA SURECLICK) 400 MG/ML SOAJ Inject 140 mg into the skin every 14 (fourteen) days. (Patient not taking: No sig reported) 2 pen 11      Drug Regimen Review  Drug regimen was reviewed and remains appropriate with no significant issues identified   Home: Home Living Family/patient expects to be discharged to:: Private residence Living Arrangements: Spouse/significant other Available Help at Discharge: Family,Available 24 hours/day Type of Home: House Home Access: Stairs to enter [employer] of Steps: couple Entrance Stairs-Rails: Right,Left Home Layout: One level Bathroom Shower/Tub: Chiropodist:  (comfort leve) Bathroom Accessibility: Yes Home Equipment: Cane - single point,Crutches  Lives With: Spouse   Functional History: Prior Function Level of Independence: Independent   Functional Status:  Mobility: Bed Mobility Overal bed mobility: Needs Assistance Bed Mobility: Rolling,Supine to Sit Rolling: Min guard Supine to sit: Min assist Sit to supine: Mod assist General bed mobility comments: OOB in chair Transfers Overall transfer level: Needs assistance Transfers: Sit to/from Stand Sit to Stand: Min assist Stand pivot transfers: Min guard General transfer comment: modA and increased time for  power up to standing, Ambulation/Gait Ambulation/Gait assistance: Min assist Gait Distance (Feet): 150 Feet Assistive device: Rolling walker (2 wheeled) (EVA walker) Gait Pattern/deviations: Step-through pattern,Decreased step length - right,Decreased step length - left,Shuffle General Gait Details: min A for steadying with EVA walker, pt with  1x standing rest break half way through due to increased WoB Gait velocity: slower Gait velocity interpretation: <1.8 ft/sec, indicate of risk for recurrent falls   ADL: ADL Overall ADL's : Needs assistance/impaired Eating/Feeding: Independent,Sitting Eating/Feeding Details (indicate cue type and reason): Dys 2 with honey thick liquids Grooming: Wash/dry hands,Wash/dry face,Oral care,Sitting,Set up Upper Body Dressing : Moderate assistance,Sitting Lower Body Dressing: Moderate assistance,Sit to/from stand Functional mobility during ADLs: Min guard   Cognition: Cognition Overall Cognitive Status: Within Functional Limits for tasks assessed Orientation Level: Oriented X4 Cognition Arousal/Alertness: Awake/alert Behavior During Therapy: WFL for tasks assessed/performed Overall Cognitive Status: Within Functional Limits for tasks assessed     Blood pressure 114/82, pulse 72, temperature 99 F (37.2 C), resp. rate 19, height 5\' 8"  (1.727 m), weight 102.6 kg, SpO2 98 %. Physical Exam Vitals and nursing note reviewed.  Constitutional:      General: He is not in acute distress.    Appearance: Normal appearance. He is ill-appearing.     Comments: cortak in place. On 4 L oxygen per [facility].   HENT:     Head: Normocephalic.     Comments: dysphonic    Right Ear: External ear normal.     Left Ear: External ear normal.     Mouth/Throat:     Mouth: Mucous membranes are moist.     Pharynx: Oropharynx is clear.  Eyes:     Extraocular Movements: Extraocular movements intact.     Pupils: Pupils are equal, round, and reactive to light.  Cardiovascular:      Rate and Rhythm: Normal rate and regular rhythm.     Heart sounds: No murmur heard. No gallop.   Pulmonary:     Effort: Pulmonary effort is normal. No respiratory distress.     Breath sounds: Wheezing present.  Abdominal:     General: Bowel sounds are normal. There is no distension.     Palpations: Abdomen is soft.     Tenderness: There is no abdominal tenderness.  Musculoskeletal:        General: Swelling (2+ edema BLE) present.     Cervical back: Normal range of motion.  Skin:    General: Skin is warm.     Comments: Scattered ecchymoses in both UE's  Neurological:     Mental Status: He is alert.     Comments: Alert and oriented x 3. Normal insight and awareness. Intact Memory. Normal language and speech. Cranial nerve exam unremarkable UE 4/5 prox to distal. Sl decrease in FTN coordination on left. No tremor. LE 4-/5 prox to 4/5 distally. No focal sensory abnl. DTR's 1+  Psychiatric:        Mood and Affect: Mood normal.        Behavior: Behavior normal.        Lab Results Last 48 Hours        Results for orders placed or performed during the hospital encounter of 10/14/20 (from the past 48 hour(s))  Glucose, capillary     Status: None    Collection Time: 10/21/20  8:02 PM  Result Value Ref Range    Glucose-Capillary 90 70 - 99 mg/dL      Comment: Glucose reference range applies only to samples taken after fasting for at least 8 hours.  Glucose, capillary     Status: Abnormal    Collection Time: 10/21/20 11:19 PM  Result Value Ref Range    Glucose-Capillary 113 (H) 70 - 99 mg/dL      Comment: Glucose reference  range applies only to samples taken after fasting for at least 8 hours.  Basic metabolic panel     Status: Abnormal    Collection Time: 10/22/20  3:14 AM  Result Value Ref Range    Sodium 146 (H) 135 - 145 mmol/L    Potassium 3.7 3.5 - 5.1 mmol/L    Chloride 98 98 - 111 mmol/L    CO2 37 (H) 22 - 32 mmol/L    Glucose, Bld 102 (H) 70 - 99 mg/dL      Comment:  Glucose reference range applies only to samples taken after fasting for at least 8 hours.    BUN 27 (H) 8 - 23 mg/dL    Creatinine, Ser 1.01 0.61 - 1.24 mg/dL    Calcium 8.1 (L) 8.9 - 10.3 mg/dL    GFR, Estimated >60 >60 mL/min      Comment: (NOTE) Calculated using the CKD-EPI Creatinine Equation (2021)      Anion gap 11 5 - 15      Comment: Performed at [facility] [address], [place], [place] [address]  CBC     Status: Abnormal    Collection Time: 10/22/20  3:14 AM  Result Value Ref Range    WBC 11.8 (H) 4.0 - 10.5 K/uL    RBC 3.19 (L) 4.22 - 5.81 MIL/uL    Hemoglobin 10.3 (L) 13.0 - 17.0 g/dL    HCT 33.7 (L) 39.0 - 52.0 %    MCV 105.6 (H) 80.0 - 100.0 fL    MCH 32.3 26.0 - 34.0 pg    MCHC 30.6 30.0 - 36.0 g/dL    RDW 13.2 11.5 - 15.5 %    Platelets 230 150 - 400 K/uL    nRBC 0.0 0.0 - 0.2 %      Comment: Performed at [facility], [place] [address], [place], [place] [address]  Glucose, capillary     Status: None    Collection Time: 10/22/20  3:24 AM  Result Value Ref Range    Glucose-Capillary 89 70 - 99 mg/dL      Comment: Glucose reference range applies only to samples taken after fasting for at least 8 hours.  Glucose, capillary     Status: None    Collection Time: 10/22/20  8:41 AM  Result Value Ref Range    Glucose-Capillary 74 70 - 99 mg/dL      Comment: Glucose reference range applies only to samples taken after fasting for at least 8 hours.  Glucose, capillary     Status: None    Collection Time: 10/22/20 11:23 AM  Result Value Ref Range    Glucose-Capillary 94 70 - 99 mg/dL      Comment: Glucose reference range applies only to samples taken after fasting for at least 8 hours.  Glucose, capillary     Status: None    Collection Time: 10/22/20  3:29 PM  Result Value Ref Range    Glucose-Capillary 99 70 - 99 mg/dL      Comment: Glucose reference range applies only to samples taken after fasting for at least 8 hours.  Glucose, capillary     Status:  None    Collection Time: 10/22/20  9:15 PM  Result Value Ref Range    Glucose-Capillary 80 70 - 99 mg/dL      Comment: Glucose reference range applies only to samples taken after fasting for at least 8 hours.  Glucose, capillary     Status: Abnormal  Collection Time: 10/23/20 12:11 AM  Result Value Ref Range    Glucose-Capillary 118 (H) 70 - 99 mg/dL      Comment: Glucose reference range applies only to samples taken after fasting for at least 8 hours.  Glucose, capillary     Status: Abnormal    Collection Time: 10/23/20  3:36 AM  Result Value Ref Range    Glucose-Capillary 104 (H) 70 - 99 mg/dL      Comment: Glucose reference range applies only to samples taken after fasting for at least 8 hours.  Glucose, capillary     Status: Abnormal    Collection Time: 10/23/20  4:12 AM  Result Value Ref Range    Glucose-Capillary 117 (H) 70 - 99 mg/dL      Comment: Glucose reference range applies only to samples taken after fasting for at least 8 hours.  Glucose, capillary     Status: None    Collection Time: 10/23/20  7:22 AM  Result Value Ref Range    Glucose-Capillary 72 70 - 99 mg/dL      Comment: Glucose reference range applies only to samples taken after fasting for at least 8 hours.  Basic metabolic panel     Status: Abnormal    Collection Time: 10/23/20  8:32 AM  Result Value Ref Range    Sodium 144 135 - 145 mmol/L    Potassium 4.2 3.5 - 5.1 mmol/L    Chloride 98 98 - 111 mmol/L    CO2 37 (H) 22 - 32 mmol/L    Glucose, Bld 113 (H) 70 - 99 mg/dL      Comment: Glucose reference range applies only to samples taken after fasting for at least 8 hours.    BUN 28 (H) 8 - 23 mg/dL    Creatinine, Ser 0.90 0.61 - 1.24 mg/dL    Calcium 8.8 (L) 8.9 - 10.3 mg/dL    GFR, Estimated >60 >60 mL/min      Comment: (NOTE) Calculated using the CKD-EPI Creatinine Equation (2021)      Anion gap 9 5 - 15      Comment: Performed at [facility] [address], [place], [place] [address]   Glucose, capillary     Status: Abnormal    Collection Time: 10/23/20 11:12 AM  Result Value Ref Range    Glucose-Capillary 105 (H) 70 - 99 mg/dL      Comment: Glucose reference range applies only to samples taken after fasting for at least 8 hours.       Imaging Results (Last 48 hours)  DG Chest 1 View   Result Date: 10/22/2020 CLINICAL DATA:  Chest pain EXAM: CHEST  1 VIEW COMPARISON:  October 20, 2020 FINDINGS: Enteric tube tip is below the diaphragm. Central catheter tip is at the cavoatrial junction. No pneumothorax. There is atelectatic change in the bases. There is an equivocal left pleural effusion. Heart size and pulmonary vascularity within normal limits. Status post coronary artery bypass grafting. No adenopathy. No bone lesions. IMPRESSION: Tube and catheter positions as described without pneumothorax. Bibasilar atelectasis with equivocal left pleural effusion. Stable cardiac silhouette. Electronically Signed   By: Lowella Grip III M.D.   On: 10/22/2020 08:00             Medical Problem List and Plan: 1.  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  Antithrombotics: -DVT/anticoagulation:  Pharmaceutical: Lovenox--added             -antiplatelet therapy: Plavix/ASA 3. Pain Management:  Oxycodone prn severe and tramadol prn moderate pain 4. Mood: LCSW to follow for evaluation and support.              -antipsychotic agents: N/A 5. Neuropsych: This patient is capable of making decisions on his own behalf. 6. Skin/Wound Care: routine pressure relief measures.              --Monitor incisions for healing  7. Fluids/Electrolytes/Nutrition: Monitor I/O. Check lytes on Monday 8. CAD s/p CABG: Continue sternal precautions             --monitor for symptoms with increased activity             --On Lasix 40mg  IV bid-->begin QD  tomorrow for 2 days, then change to po based on weight/diuresis. Pt still a few pounds over pre-op weight             --lopressor increased to 25mg  bid 2/10 to help manage tachycardia             -will add ACE wraps to LE to assist with LE edema.  9. HTN: Monitor BP tid-- Lasix, lopressor  10. H/o DOE/Hypoxia: Encourage pulmonary hygiene             --wean oxygen as able.  11. Pre-renal azotemia: Encourage fluids but monitor for signs of overaload. 12. Dysphagia: Continue DII, honey liquids             --pt has a deviated septum, tube very uncomfortable             -he's eating well (100% breakfast)---> dc NGT             -push honeys. No IVF at this point given above 13. ABLA: Recheck CBC 02/14 14. Stress induced hyperglycemia: Hgb A1c-5.6. Monitor BS ac/hs and use SSI for control to help promote wound healing.  15. COPD: Used Symbicort prn at home--resume and schedule.         Bary Leriche, PA-C 10/23/2020  I have personally performed a face to face diagnostic evaluation of this patient and formulated the key components of the plan.  Additionally, I have personally reviewed laboratory data, imaging studies, as well as relevant notes and concur with the physician assistant's documentation above.  The patient's status has not changed from the original H&P.  Any changes in documentation from the acute care chart have been noted above.  Meredith Staggers, MD, [facility]

## 2020-10-25 NOTE — Progress Notes (Signed)
Patient presents to 4W via recliner.

## 2020-10-25 NOTE — Progress Notes (Signed)
Patient has been surgically stable for discharge to CIR since yesterday. I was just informed a bed in CIR is now available. DCP updated and all medications reconciled. [place] for discharge to CIR.

## 2020-10-25 NOTE — Progress Notes (Signed)
Signed        PMR Admission Coordinator Pre-Admission Assessment   Patient: Jonathan [name] is an 70 y.o., male MRN: [id number] DOB: 03/09/1951 Height: 5\' 8"  (172.7 cm) Weight: 100 kg                                                                                                                                                  Insurance Information HMO:     PPO:      PCP:      IPA:      80/20:      OTHER:  PRIMARY: [place] [employer]      Policy#: [id number]      Subscriber: pt CM Name:      Phone#: [phone] option #3     Fax#: [phone] Pre-Cert#: [id number]. [employer] reference number: [id number].  Approval received 2/11 for 7 days beginning 2/11-2/17. Confirmed approval with Rose.      Employer:  Benefits:  Phone #: [phone]     Name: 2/10 Eff. Date: 09/12/2020     Deduct: none      Out of Pocket Max: $4500      Life Max: none  CIR: $325 co pay per day days 1 until 5      SNF: no copay days 1 until 20; $188 copay per day day 21 until 44; no copay days 45 until 100 Outpatient: $30 per visit     Co-Pay: visits per medical neccesity Home Health: 100%      Co-Pay: visits per medical neccesity DME: 80%     Co-Pay: 20% Providers: in network  SECONDARY: none   Financial Counselor:       Phone#:    [employer]" for patients in Inpatient Rehabilitation Facilities with attached "Privacy Act [place] Records" was provided and verbally reviewed with: Patient and Family   Emergency Contact Information         Contact Information     Name Relation Home Work [place] Spouse [phone]       Current Medical History  Patient Admitting Diagnosis: CVA s/p CABG   History of Present Illness:69 y.o. male with history of HTN, COPD, CAD--had refused CABG last year but continued to decline with progressive weakness, DOE and BLE pain. He reports that he could not walk household distances without SOB or  CP. [name]  He was admitted on 10/14/2020 for cath revealing severe CAD with Stent restenosis and underwent CABG x 4 by Dr. Julien Girt on 10/15/2020.  Post op fluid overload treated with diuresis. He had issues with confusion as well as productive cough with hypoxia/DOE with minimal activity.  Nurse reports that patient had decline in mental status with difficulty swallowing yesterday which precipitated CT head.  CT head revealed age indeterminate left posterior cerebellar acute/subacute infarct--neurology  recommended stroke work-up. MRA of brain performed and showed patchy small volume early subacute ischemic infarcts involving left cerebellar hemisphere. 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   BSS done revealing increased WOB with po trials and odynophagia--NPO was recommended and cortrak placed for supplemental feeding.  MBS performed on 2/9 and pt was cleared to begin dysphagia 2/honey thick liquid diet. Therapy evaluations completed and CIR recommended d/t pt's deficits in swallow function, functional mobility and ADLs.       Glasgow Coma Scale Score: 15   Past Medical History      Past Medical History:  Diagnosis Date  . Arthritis      hands   . Chronic bronchitis ([place])    . Chronic sinusitis of both maxillary sinuses 04/16/2018    Sinus CT 04/25/2018 Mild mucoperiosteal thickening involving the frontoethmoidal sinuses. Otherwise largely clear sinuses with no evidence for acute sinusitis.   [name] COPD GOLD II   active smoker 04/16/2018    Spirometry 04/16/2018  FEV1 1.43 (47%)  Ratio 62  s prior rx  - 04/16/2018    try symbicort 160 2bid - PFT's  06/08/2018  FEV1 2.03 (71 % ) ratio 61  p 5 % improvement from saba p nothing prior to study with DLCO  63 % corrects to 72  % for alv volume    -  06/08/2018  After extensive coaching inhaler device,  effectiveness =    75%  (short ti/ variable flow) > continue symbicort 160 2bid   . Coronary artery disease    . Coronary artery disease involving native coronary  artery of native heart with unstable angina pectoris ([place])    . DOE (dyspnea on exertion) 04/16/2018  . Essential hypertension, benign 09/23/2012    Changed acei to arb 04/16/2018 due to pseudowheeze > improved 06/08/2018   . Hernia, inguinal 09/23/2012  . Hyperlipidemia      has not tolerated statins,  would not pursue PSK9 inhibitor as advised by Dr Claiborne Billings  . Hypertension    . Myocardial infarction ([place])    . NSTEMI (non-ST elevated myocardial infarction) ([place]) 09/23/2012  . 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 Tobacco abuse        Family History  family history includes CAD in his father.   Prior Rehab/Hospitalizations:  Has the patient had prior rehab or hospitalizations prior to admission? Yes   Has the patient had major surgery during 100 days prior to admission? Yes   Current Medications    Current Facility-Administered Medications:  .  aspirin chewable tablet 81 mg, 81 mg, Per Tube, Daily, [name], MD, 81 mg at 10/25/20 0827 .  bisacodyl (DULCOLAX) EC tablet 10 mg, 10 mg, Oral, Daily, 10 mg at 10/24/20 0906 **OR** bisacodyl (DULCOLAX) suppository 10 mg, 10 mg, Rectal, Daily, Atkins, Glenice Bow, MD, 10 mg at 10/22/20 1050 .  Chlorhexidine Gluconate Cloth 2 % PADS 6 each, 6 each, Topical, Daily, [name], MD, 6 each at 10/24/20 1123 .  clopidogrel (PLAVIX) tablet 75 mg, 75 mg, Per Tube, Daily, Orvan Seen, Glenice Bow, MD, 75 mg at 10/25/20 0827 .  colchicine tablet 0.6 mg, 0.6 mg, Per Tube, BID, Orvan Seen, Glenice Bow, MD, 0.6 mg at 10/25/20 0827 .  feeding supplement (PROSource TF) liquid 45 mL, 45 mL, Per Tube, QID, Atkins, Glenice Bow, MD, 45 mL at 10/25/20 0826 .  feeding supplement (VITAL 1.5 CAL) liquid 1,000 mL, 1,000 mL, Per Tube, Q24H, Rexene Alberts, MD, Stopped  at 10/25/20 0838 .  furosemide (LASIX) injection 40 mg, 40 mg, Intravenous, BID, Orvan Seen, Glenice Bow, MD, 40 mg at 10/25/20 0827 .  guaiFENesin tablet 200 mg, 200 mg, Per Tube, Q6H, Atkins, Glenice Bow, MD, 200  mg at 10/25/20 0615 .  insulin aspart (novoLOG) injection 0-24 Units, 0-24 Units, Subcutaneous, Q4H, Orvan Seen, Glenice Bow, MD, 2 Units at 10/21/20 1129 .  ipratropium-albuterol (DUONEB) 0.5-2.5 (3) MG/3ML nebulizer solution 3 mL, 3 mL, Nebulization, Q6H PRN, Orvan Seen, Glenice Bow, MD, 3 mL at 10/22/20 1727 .  lactated ringers infusion 500 mL, 500 mL, Intravenous, Once PRN, Atkins, Glenice Bow, MD .  lactated ringers infusion, , Intravenous, Continuous, Orvan Seen, Glenice Bow, MD, Stopped at 10/18/20 0849 .  MEDLINE mouth rinse, 15 mL, Mouth Rinse, BID, Atkins, Glenice Bow, MD, 15 mL at 10/25/20 0843 .  metoprolol tartrate (LOPRESSOR) injection 2.5-5 mg, 2.5-5 mg, Intravenous, Q2H PRN, Atkins, Glenice Bow, MD .  metoprolol tartrate (LOPRESSOR) tablet 25 mg, 25 mg, Per Tube, BID, 25 mg at 10/25/20 0827 **OR** [DISCONTINUED] metoprolol tartrate (LOPRESSOR) 25 mg/10 mL oral suspension 12.5 mg, 12.5 mg, Per Tube, BID, Conte, Tessa N, PA-C, 12.5 mg at 10/16/20 2155 .  nicotine (NICODERM CQ - dosed in mg/24 hours) patch 21 mg, 21 mg, Transdermal, Daily, [name], MD, 21 mg at 10/25/20 0843 .  ondansetron (ZOFRAN) injection 4 mg, 4 mg, Intravenous, Q6H PRN, [name], MD, 4 mg at 10/19/20 6734 .  oxyCODONE (Oxy IR/ROXICODONE) immediate release tablet 5-10 mg, 5-10 mg, Oral, Q3H PRN, [name], MD, 5 mg at 10/23/20 0832 .  pantoprazole sodium (PROTONIX) 40 mg/20 mL oral suspension 40 mg, 40 mg, Per Tube, Daily, Orvan Seen, Glenice Bow, MD, 40 mg at 10/25/20 0827 .  phenol (CHLORASEPTIC) mouth spray 1 spray, 1 spray, Mouth/Throat, PRN, Atkins, Broadus Z, MD .  sodium chloride flush (NS) 0.9 % injection 10-40 mL, 10-40 mL, Intracatheter, Q12H, Atkins, Glenice Bow, MD, 20 mL at 10/25/20 0847 .  sodium chloride flush (NS) 0.9 % injection 3 mL, 3 mL, Intravenous, Q12H, Atkins, Glenice Bow, MD, 3 mL at 10/24/20 0912 .  traMADol (ULTRAM) tablet 50-100 mg, 50-100 mg, Oral, Q4H PRN, [name], MD, 100 mg at  10/22/20 1641   Patients Current Diet:     Diet Order                      DIET DYS 2 Room service appropriate? Yes; Fluid consistency: Honey Thick  Diet effective now                      Precautions / Restrictions Precautions Precautions: Fall,Sternal Restrictions Weight Bearing Restrictions: Yes (Sternal Precautions) RUE Weight Bearing: Weight bearing as tolerated LLE Weight Bearing: Weight bearing as tolerated Other Position/Activity Restrictions: pushing through his knees    Has the patient had 2 or more falls or a fall with injury in the past year?No   Prior Activity Level Community (5-7x/wk): independent   Prior Functional Level Prior Function Level of Independence: Independent   Self Care: Did the patient need help bathing, dressing, using the toilet or eating?  Independent   Indoor Mobility: Did the patient need assistance with walking from room to room (with or without device)? Independent   Stairs: Did the patient need assistance with internal or external stairs (with or without device)? Independent   Functional Cognition: Did the patient need help planning regular tasks such as shopping or remembering to  take medications? Independent   Home Assistive Devices / Willis Devices/Equipment: Eyeglasses Home Equipment: Cane - single point,Crutches   Prior Device Use: Indicate devices/aids used by the patient prior to current illness, exacerbation or injury? cane   Current Functional Level Cognition   Overall Cognitive Status: Within Functional Limits for tasks assessed Orientation Level: Oriented X4    Extremity Assessment (includes Sensation/Coordination)   Upper Extremity Assessment: Overall WFL for tasks assessed  Lower Extremity Assessment: Defer to PT evaluation LLE Deficits / Details: generally WFL, but not as quick and coordinated to respond to commands.     ADLs   Overall ADL's : Needs assistance/impaired Eating/Feeding:  Independent,Sitting Eating/Feeding Details (indicate cue type and reason): Dys 2 with honey thick liquids Grooming: Wash/dry hands,Wash/dry face,Oral care,Sitting,Set up Upper Body Dressing : Moderate assistance,Sitting Lower Body Dressing: Moderate assistance,Sit to/from stand Functional mobility during ADLs: Min guard     Mobility   Overal bed mobility: Needs Assistance Bed Mobility: Rolling,Supine to Sit Rolling: Min guard Supine to sit: Min assist Sit to supine: Mod assist General bed mobility comments: OOB in chair     Transfers   Overall transfer level: Needs assistance Transfers: Sit to/from Stand Sit to Stand: Min assist Stand pivot transfers: Min guard General transfer comment: modA and increased time for power up to standing,     Ambulation / Gait / Stairs / Wheelchair Mobility   Ambulation/Gait Ambulation/Gait assistance: Herbalist (Feet): 150 Feet Assistive device: Rolling walker (2 wheeled) (EVA walker) Gait Pattern/deviations: Step-through pattern,Decreased step length - right,Decreased step length - left,Shuffle General Gait Details: min A for steadying with EVA walker, pt with 1x standing rest break half way through due to increased WoB Gait velocity: slower Gait velocity interpretation: <1.8 ft/sec, indicate of risk for recurrent falls     Posture / Balance Balance Overall balance assessment: Mild deficits observed, not formally tested     Special needs/care consideration Designated visitor is wife, Santiago Glad 43 inch 10 Fr cortrak placed         Previous Home Environment  Living Arrangements: Spouse/significant other  Lives With: Spouse Available Help at Discharge: Family,Available 24 hours/day Type of Home: House Home Layout: One level Home Access: Stairs to enter Entrance Stairs-Rails: Surveyor, mining of Steps: couple Bathroom Shower/Tub: Chiropodist:  (comfort leve) Bathroom Accessibility: Yes How  Accessible: Accessible via walker Home Care Services: No   Discharge Living Setting Plans for Discharge Living Setting: Patient's home,Lives with (comment) (wife) Type of Home at Discharge: House Discharge Home Layout: One level Discharge Home Access: Stairs to enter Entrance Stairs-Rails: Surveyor, mining of Steps: couple Discharge Bathroom Shower/Tub: Tub/shower unit Discharge Bathroom Toilet: Standard Discharge Bathroom Accessibility: Yes How Accessible: Accessible via walker Does the patient have any problems obtaining your medications?: No   Social/Family/Support Systems Contact Information: wife, Santiago Glad Anticipated Caregiver: wife Ability/Limitations of Caregiver: supervision level Caregiver Availability: 24/7 Discharge Plan Discussed with Primary Caregiver: Yes Is Caregiver In Agreement with Plan?: Yes Does Caregiver/Family have Issues with Lodging/Transportation while Pt is in Rehab?: No   Goals Patient/Family Goal for Rehab: supervision PT, OT, and SLP Expected length of stay: ELOS 2 weeks Pt/Family Agrees to Admission and willing to participate: Yes Program Orientation Provided & Reviewed with Pt/Caregiver Including Roles  & Responsibilities: Yes   Decrease burden of Care through IP rehab admission: n/a   Possible need for SNF placement upon discharge:not anticipated   Patient Condition: This patient's medical and functional status  has changed since the consult dated: 10/21/2020 in which the Rehabilitation Physician determined and documented that the patient's condition is appropriate for intensive rehabilitative care in an inpatient rehabilitation facility. See "History of Present Illness" (above) for medical update. Functional changes are: Pt is currently Mod A with transfers, Min A 150' with gait; Min A-Min G with ADLs . Patient's medical and functional status update has been discussed with the Rehabilitation physician and patient remains appropriate for  inpatient rehabilitation. Will admit to inpatient rehab today.   Preadmission Screen Completed By: Danne Baxter RN MSN with day of admission updates by Bethel Born, CCC-SLP, 10/25/2020 8:56 AM ______________________________________________________________________   Discussed status with Dr. Naaman Plummer on 10/25/20  at 8:56 AM and received approval for admission today.   Admission Coordinator: Danne Baxter RN MSN with day of admission updates by Bethel Born, time 8:56 AM Sudie Grumbling 10/25/20

## 2020-10-26 ENCOUNTER — Inpatient Hospital Stay (HOSPITAL_COMMUNITY)
Admission: RE | Admit: 2020-10-26 | Payer: Medicare Other | Source: Intra-hospital | Admitting: Physical Medicine and Rehabilitation

## 2020-10-26 DIAGNOSIS — I639 Cerebral infarction, unspecified: Secondary | ICD-10-CM | POA: Diagnosis not present

## 2020-10-26 LAB — COMPREHENSIVE METABOLIC PANEL
ALT: 28 U/L (ref 0–44)
AST: 30 U/L (ref 15–41)
Albumin: 2.7 g/dL — ABNORMAL LOW (ref 3.5–5.0)
Alkaline Phosphatase: 44 U/L (ref 38–126)
Anion gap: 14 (ref 5–15)
BUN: 32 mg/dL — ABNORMAL HIGH (ref 8–23)
CO2: 30 mmol/L (ref 22–32)
Calcium: 8.6 mg/dL — ABNORMAL LOW (ref 8.9–10.3)
Chloride: 98 mmol/L (ref 98–111)
Creatinine, Ser: 1.06 mg/dL (ref 0.61–1.24)
GFR, Estimated: >60 mL/min (ref >60)
Glucose, Bld: 98 mg/dL (ref 70–99)
Potassium: 4.3 mmol/L (ref 3.5–5.1)
Sodium: 142 mmol/L (ref 135–145)
Total Bilirubin: 0.9 mg/dL (ref 0.3–1.2)
Total Protein: 6.3 g/dL — ABNORMAL LOW (ref 6.5–8.1)

## 2020-10-26 LAB — CBC WITH DIFFERENTIAL/PLATELET
Abs Immature Granulocytes: 0.06 10*3/uL (ref 0.00–0.07)
Basophils Absolute: 0 10*3/uL (ref 0.0–0.1)
Basophils Relative: 0 %
Eosinophils Absolute: 0.3 10*3/uL (ref 0.0–0.5)
Eosinophils Relative: 2 %
HCT: 32.6 % — ABNORMAL LOW (ref 39.0–52.0)
Hemoglobin: 10.3 g/dL — ABNORMAL LOW (ref 13.0–17.0)
Immature Granulocytes: 1 %
Lymphocytes Relative: 16 %
Lymphs Abs: 1.8 10*3/uL (ref 0.7–4.0)
MCH: 32 pg (ref 26.0–34.0)
MCHC: 31.6 g/dL (ref 30.0–36.0)
MCV: 101.2 fL — ABNORMAL HIGH (ref 80.0–100.0)
Monocytes Absolute: 0.9 10*3/uL (ref 0.1–1.0)
Monocytes Relative: 8 %
Neutro Abs: 8.4 10*3/uL — ABNORMAL HIGH (ref 1.7–7.7)
Neutrophils Relative %: 73 %
Platelets: 414 10*3/uL — ABNORMAL HIGH (ref 150–400)
RBC: 3.22 MIL/uL — ABNORMAL LOW (ref 4.22–5.81)
RDW: 13.2 % (ref 11.5–15.5)
WBC: 11.5 10*3/uL — ABNORMAL HIGH (ref 4.0–10.5)
nRBC: 0 % (ref 0.0–0.2)

## 2020-10-26 LAB — GLUCOSE, CAPILLARY
Glucose-Capillary: 101 mg/dL — ABNORMAL HIGH (ref 70–99)
Glucose-Capillary: 89 mg/dL (ref 70–99)

## 2020-10-26 MED ORDER — SODIUM CHLORIDE 0.9% FLUSH
10.0000 mL | INTRAVENOUS | Status: DC | PRN
  Administered 2020-10-28: 20 mL
  Administered 2020-10-30: 10 mL

## 2020-10-26 MED ORDER — PHENOL 1.4 % MT LIQD
1.0000 | OROMUCOSAL | Status: DC | PRN
  Filled 2020-10-26: qty 177

## 2020-10-26 MED ORDER — FUROSEMIDE 40 MG PO TABS
40.0000 mg | ORAL_TABLET | Freq: Two times a day (BID) | ORAL | Status: DC
  Administered 2020-10-26 – 2020-11-04 (×19): 40 mg via ORAL
  Filled 2020-10-26 (×20): qty 1

## 2020-10-26 MED ORDER — SODIUM CHLORIDE 0.9% FLUSH
10.0000 mL | Freq: Two times a day (BID) | INTRAVENOUS | Status: DC
  Administered 2020-10-26 – 2020-10-28 (×3): 10 mL

## 2020-10-26 MED ORDER — EXERCISE FOR HEART AND HEALTH BOOK
Freq: Once | Status: AC
  Filled 2020-10-26: qty 1

## 2020-10-26 MED ORDER — POTASSIUM CHLORIDE 20 MEQ PO PACK
20.0000 meq | PACK | Freq: Two times a day (BID) | ORAL | Status: DC
  Administered 2020-10-27 – 2020-11-04 (×17): 20 meq via ORAL
  Filled 2020-10-26 (×17): qty 1

## 2020-10-26 MED ORDER — CHLORHEXIDINE GLUCONATE CLOTH 2 % EX PADS
6.0000 | MEDICATED_PAD | Freq: Every day | CUTANEOUS | Status: DC
  Administered 2020-10-26 – 2020-11-04 (×7): 6 via TOPICAL

## 2020-10-26 NOTE — Progress Notes (Signed)
Patient ID: Jonathan [name], male   DOB: [birth year], 70 y.o.   MRN: [id number] Met with the patient and wife to review role of the nurse CM and address educational needs. Discussed secondary risks including HTN, HLD, CAD with smoking. Patient's wife asking frequency of checking BP after discharge and if she would need a BP machine for discharge and scale for daily weights. . Patient given handouts on CAD, DASH diet, Smoking cessation.Continue to follow along to discharge to address concerns/educational needs    Margarito Liner

## 2020-10-26 NOTE — Progress Notes (Signed)
PROGRESS NOTE   Subjective/Complaints:  Pt c/o difficulty swallowing, reviewed chart , pt had NG tube removed several days ago, placed on D2 honey diet after MBS 2/9.  No other new sx such as weakness, some mild throat soreness   ROS- no CP, SOB, N/V/D   Objective:   No results found. Recent Labs    10/26/20 0540  WBC 11.5*  HGB 10.3*  HCT 32.6*  PLT 414*   Recent Labs    10/26/20 0540  NA 142  K 4.3  CL 98  CO2 30  GLUCOSE 98  BUN 32*  CREATININE 1.06  CALCIUM 8.6*    Intake/Output Summary (Last 24 hours) at 10/26/2020 0837 Last data filed at 10/25/2020 1846 Gross per 24 hour  Intake 240 ml  Output --  Net 240 ml        Physical Exam: Vital Signs Blood pressure 132/66, pulse 77, temperature 98.4 F (36.9 C), resp. rate 18, weight 98.9 kg, SpO2 98 %.   General: No acute distress Mood and affect are appropriate Heart: Regular rate and rhythm no rubs murmurs or extra sounds Lungs: Clear to auscultation, breathing unlabored, no rales or wheezes Abdomen: Positive bowel sounds, soft nontender to palpation, nondistended Extremities: No clubbing, cyanosis, or edema Skin: No evidence of breakdown, no evidence of rash Neurologic: Cranial nerves II through XII intact, motor strength is 5/5 in bilateral deltoid, bicep, tricep, grip, hip flexor, knee extensors, ankle dorsiflexor and plantar flexor  Cerebellar exam normal finger to nose to finger as well as heel to shin in bilateral upper and lower extremities Musculoskeletal: Full range of motion in all 4 extremities. No joint swelling  Pt swallowed honey thick cold water without cough or change in vocal quality  Assessment/Plan: 1. Functional deficits which require 3+ hours per day of interdisciplinary therapy in a comprehensive inpatient rehab setting.  Physiatrist is providing close team supervision and 24 hour management of active medical problems listed  below.  Physiatrist and rehab team continue to assess barriers to discharge/monitor patient progress toward functional and medical goals  Care Tool:  Bathing              Bathing assist       Upper Body Dressing/Undressing Upper body dressing   What is the patient wearing?: Hospital gown only    Upper body assist Assist Level: Moderate Assistance - Patient 50 - 74%    Lower Body Dressing/Undressing Lower body dressing      What is the patient wearing?: Pants,Incontinence brief     Lower body assist Assist for lower body dressing: Moderate Assistance - Patient 50 - 74%     Toileting Toileting    Toileting assist Assist for toileting: Contact Guard/Touching assist     Transfers Chair/bed transfer  Transfers assist     Chair/bed transfer assist level: Moderate Assistance - Patient 50 - 74%     Locomotion Ambulation   Ambulation assist      Assist level: Contact Guard/Touching assist       Walk 10 feet activity   Assist           Walk 50 feet activity   Assist  Walk 150 feet activity   Assist           Walk 10 feet on uneven surface  activity   Assist           Wheelchair     Assist               Wheelchair 50 feet with 2 turns activity    Assist            Wheelchair 150 feet activity     Assist          Blood pressure 132/66, pulse 77, temperature 98.4 F (36.9 C), resp. rate 18, weight 98.9 kg, SpO2 98 %.  Medical Problem List and Plan: 1.Functional and mobility deficitssecondary to left cerebellar infarcts after CABG -patient may shower -ELOS/Goals: 12-14 days, supervision to mod I with PT, OT, SLP 2. Antithrombotics: -DVT/anticoagulation:Pharmaceutical:Lovenox--added -antiplatelet therapy: Plavix/ASA 3. Pain Management:Oxycodone prn severe and tramadol prn moderate pain 4. Mood:LCSW to follow for evaluation and  support. -antipsychotic agents: N/A 5. Neuropsych: This patientiscapable of making decisions on hisown behalf. 6. Skin/Wound Care:routine pressure relief measures.  --Monitor incisions for healing  7. Fluids/Electrolytes/Nutrition:Monitor I/O. Check lytes on Monday 8. CAD s/p CABG: Continue sternal precautions --monitor for symptoms with increased activity --On Lasix 40mg IV bid--> change to po based on weight/diuresis. Pt still a few pounds over pre-op weight --lopressorincreased to 25mg bid 2/10to help manage tachycardia -will add ACE wraps to LE to assist with LE edema. 9. HTN: Monitor BP tid-- Lasix,lopressor 10. H/o DOE/Hypoxia: Encourage pulmonary hygiene --wean oxygen as able.  11. Pre-renal azotemia: Encourage fluids but monitor for signs of overaload. 12. Dysphagia: Continue DII, honey liquids --pt has a deviated septum, tube very uncomfortable -he's eating well (100% breakfast)---> dc NGT -push honeys. No IVF at this point given above Pt appears anxious about swallowing some throat irritation will rx chlorasept and ask SLP to see today , ok for po an dmeds in applesauce  13. ABLA: Recheck CBC 02/14 14. Stress induced hyperglycemia: Hgb A1c-5.6. Monitor BS ac/hs and use SSI for control to help promote wound healing. 15. COPD: Used Symbicort prn at home--resume and schedule.    LOS: 1 days A FACE TO FACE EVALUATION WAS PERFORMED  Charlett Blake 10/26/2020, 8:37 AM

## 2020-10-26 NOTE — Evaluation (Signed)
Speech Language Pathology Assessment and Plan  Patient Details  Name: Jonathan [name] MRN: [id number] Date of Birth: Sep 22, 1950  SLP Diagnosis: Dysphagia  Rehab Potential: Excellent ELOS: 2 weeks    Today's Date: 10/26/2020 SLP Individual Time: 1240-1340 SLP Individual Time Calculation (min): 60 min   Hospital Problem: Principal Problem:   Cerebellar stroke [facility])  Past Medical History:  Past Medical History:  Diagnosis Date  . Arthritis    hands   . Chronic bronchitis ([place])   . Chronic sinusitis of both maxillary sinuses 04/16/2018   Sinus CT 04/25/2018 Mild mucoperiosteal thickening involving the frontoethmoidal sinuses. Otherwise largely clear sinuses with no evidence for acute sinusitis.   [name] COPD GOLD II   active smoker 04/16/2018   Spirometry 04/16/2018  FEV1 1.43 (47%)  Ratio 62  s prior rx  - 04/16/2018    try symbicort 160 2bid - PFT's  06/08/2018  FEV1 2.03 (71 % ) ratio 61  p 5 % improvement from saba p nothing prior to study with DLCO  63 % corrects to 72  % for alv volume    -  06/08/2018  After extensive coaching inhaler device,  effectiveness =    75%  (short ti/ variable flow) > continue symbicort 160 2bid   . Coronary artery disease   . Coronary artery disease involving native coronary artery of native heart with unstable angina pectoris ([place])   . DOE (dyspnea on exertion) 04/16/2018  . Essential hypertension, benign 09/23/2012   Changed acei to arb 04/16/2018 due to pseudowheeze > improved 06/08/2018   . Hernia, inguinal 09/23/2012  . Hyperlipidemia    has not tolerated statins,  would not pursue PSK9 inhibitor as advised by Dr Claiborne Billings  . Hypertension   . Myocardial infarction ([place])   . NSTEMI (non-ST elevated myocardial infarction) ([place]) 09/23/2012  . 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 Tobacco abuse    Past Surgical History:  Past Surgical History:  Procedure Laterality Date  . CARDIAC CATHETERIZATION    . CARDIOVASCULAR STRESS TEST  11/11/2011  . CORONARY ANGIOPLASTY      stents- 01/2004   . CORONARY ARTERY BYPASS GRAFT N/A 10/15/2020   Procedure: CORONARY ARTERY BYPASS GRAFTING (CABG) TIMES FOUR USING BILATERAL INTERNAL MAMMARY ARTERIES AND ENDOSCOPICALLY HARVESTED GREATER SAPHENOUS VEIN AND INSERTION OF 50CC INTRAAORTIC BALLOON PUMP;  Surgeon: [name], MD;  Location: [place];  Service: Open Heart Surgery;  Laterality: N/A;  possible BIMA  . ENDOVEIN HARVEST OF GREATER SAPHENOUS VEIN Right 10/15/2020   Procedure: ENDOVEIN HARVEST OF GREATER SAPHENOUS VEIN;  Surgeon: [name], MD;  Location: [place];  Service: Open Heart Surgery;  Laterality: Right;  . heart stents  01/2004  . INGUINAL HERNIA REPAIR Right 12/06/2012   Procedure: HERNIA REPAIR INGUINAL ADULT;  Surgeon: [name], DO;  Location: [facility];  Service: General;  Laterality: Right;  . INSERTION OF MESH Right 12/06/2012   Procedure: INSERTION OF MESH;  Surgeon: [name], DO;  Location: [facility];  Service: General;  Laterality: Right;  . LEFT HEART CATH AND CORONARY ANGIOGRAPHY N/A 07/22/2019   Procedure: LEFT HEART CATH AND CORONARY ANGIOGRAPHY;  Surgeon: [name], MD;  Location: [place] [facility];  Service: Cardiovascular;  Laterality: N/A;  . LEFT HEART CATH AND CORONARY ANGIOGRAPHY N/A 10/14/2020   Procedure: LEFT HEART CATH AND CORONARY ANGIOGRAPHY;  Surgeon: [name], MD;  Location: [place] [facility];  Service: Cardiovascular;  Laterality: N/A;  . TEE WITHOUT CARDIOVERSION N/A  10/15/2020   Procedure: TRANSESOPHAGEAL ECHOCARDIOGRAM (TEE);  Surgeon: [name], MD;  Location: [place];  Service: Open Heart Surgery;  Laterality: N/A;    Assessment / Plan / Recommendation Clinical Impression   [name]:HALPFX A. Jonathan [name] is a 70 year old male with history of HTN, COPD, CAD--had refused CABG last year but continues to decline with progressive weakness, DOE and BLE [name]. He reported that he could not walk household distances without shortness of breath or chest [name]. He was admitted on  10/14/2020 for cath revealing severe CAD with in-stent restenosis and underwent CABG x4 by Dr. Julien Girt on 10/15/2020. Postop had issues with volume overload requiring IV diuresis as well as confusion with productive cough and hypoxia. He did have decline in mental status with difficulty swallowing 02/07 and CT of head done revealing age-indeterminate left posterior cerebellar acute/subacute infarct. Bedside swallow showed increased WOB with p.o. trials and odynophagia and he was made n.p.o. with placement of feeding tube from nutritional support.  Neurology consulted for input and recommended MRI/MRI brain for work-up. This revealed small volume early subacute ischemic infarcts involving left cerebellar hemisphere, question focal severe proximal right MCA stenosis versus artifact and mild left ICA stenosis. 2D echo done showing EF 55 to 60% with evidence of protruding immobile plaque in descending aorta. Dr. Erlinda Hong felt the stroke was embolic versus cardio procedure related and recommended DAPT as well as 30-day cardiac monitor on outpatient basis to rule out arrhythmia as cause of stroke. 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 Emotional support has been provided by staff and at this time he is agreeable to current interventions. To alcohol as well as withdrawal from was made DNR multiple times. As respiratory status improved, MBS performed on 02/09 and patient was started on dysphagia 2 diet with honey liquids.Therapy has been ongoing and patient noted to be limited by weakness, tachycardia, DOE and sternal precautions. Pt was admitted to [facility] CIR program on 10/25/20 and SLP evaluation was administered 10/26/20 with results as follows:  Pt presents with mild oropharyngeal dysphagia, with deficits  most likely related to generalized weakness and recent prolonged intubation. Pt's last MBSS conducted on 10/21/20 with airway intrusion of thin and nectar with decreased timing and closure of laryngeal vestibule. Today pt consumed honey thick liquids, ice chips, Dys 2 and Dys 3 solid textures without overt s/sx aspiration. He used safe swallowing precautions with Supervision, mostly for smaller liquid bolus size. During trials of thin H2O via tsp and self-fed cup sips, subtle throat clearing noted in ~50% of trials. Pt's mastication and oral clearance of solids was efficient and full oral clearance achieved. Recommend that pt advance to Dys 3 textures, but continue honey thick liquids for now. Medications may be administered whole in applesauce, large pills may need to be broken in half. Pt should have set up and intermittent supervision for intake, would also recommend he be up in the chair for meals, if possible. Will continue to assess readiness for further advancement and/or repeat MBSS. Pt's voice is also hoarse, which is likely due to recent intubation as well. If pt's voice does not return to clear quality and normal intensity within next 2-3 weeks, pt may benefit from ENT consult - will continue to assess. In a screen of pt's cognitive functioning, he was [facility]. He demonstrated  appropriate basic problem solving, attention, and excellent verbal recall and executive of new information (ex: cardiac precautions, swallowing precautions and details regarding deficits, how to use oral suction, etc.). His expressive and receptive language was [facility], speech fluent and clearly articulated. Pt and his wife both report that pt is at baseline cognitive-linguistic status.   Therefore, would recommend pt receive skilled ST services to address acute dysphagia in order to ensure diet safety and efficiency, and maximize his functional independence and safety prior to discharge. Pt and wife in agreement with treatment plan.     Skilled Therapeutic Interventions          Bedside swallow evaluation and cognitive-linguistic screen was administered, results reviewed with pt and his wife (please see above for details regarding the results).    SLP Assessment  Patient will need skilled Speech Lanaguage Pathology Services during CIR admission    Recommendations  SLP Diet Recommendations: Dysphagia 3 (Mech soft);Honey Liquid Administration via: Cup Medication Administration: Whole meds with puree Supervision: Patient able to self feed;Intermittent supervision to cue for compensatory strategies Compensations: Slow rate;Small sips/bites Postural Changes and/or Swallow Maneuvers: Seated upright 90 degrees;Out of bed for meals Oral Care Recommendations: Oral care BID Patient destination: Home Follow up Recommendations: Other (comment) (TBD depending on ST progress) Equipment Recommended: None recommended by SLP    SLP Frequency 3 to 5 out of 7 days   SLP Duration  SLP Intensity  SLP Treatment/Interventions 2 weeks  Minumum of 1-2 x/day, 30 to 90 minutes  Cueing hierarchy;Dysphagia/aspiration precaution training;Patient/family education    [name] [name] Assessment [name] Scale: Faces [name] Score: 0-No [name] Faces [name] Scale: No hurt  Prior Functioning Type of Home: House  Lives With: Spouse Available Help at Discharge: Family;Available 24 hours/day Vocation: Retired  Programmer, systems Overall Cognitive Status: Within Functional Limits for tasks assessed Arousal/Alertness: Awake/alert Orientation Level: Oriented X4 Memory: Appears intact Safety/Judgment: Appears intact  Comprehension Auditory Comprehension Overall Auditory Comprehension: Appears within functional limits for tasks assessed Conversation: Complex Visual Recognition/Discrimination Discrimination: Not tested Reading Comprehension Reading Status: Within funtional limits Expression Expression Primary Mode of Expression: Verbal Verbal  Expression Overall Verbal Expression: Appears within functional limits for tasks assessed Oral Motor Oral Motor/Sensory Function Overall Oral Motor/Sensory Function: Generalized oral weakness Lingual Symmetry: Within Functional Limits Velum: Within Functional Limits Mandible: Within Functional Limits Motor Speech Overall Motor Speech: Appears within functional limits for tasks assessed Phonation: Hoarse Intelligibility: Intelligible Motor Planning: Witnin functional limits Motor Speech Errors: Not applicable  Care Tool Care Tool Cognition Expression of Ideas and Wants Expression of Ideas and Wants: Without difficulty (complex and basic) - expresses complex messages without difficulty and with speech that is clear and easy to understand   Understanding Verbal and Non-Verbal Content Understanding Verbal and Non-Verbal Content: Usually understands - understands most conversations, but misses some part/intent of message. Requires cues at times to understand   Memory/Recall Ability *first 3 days only Memory/Recall Ability *first 3 days only: Current season;That he or she is in a hospital/hospital unit     Intelligibility: Intelligible  Bedside Swallowing Assessment General Date of Onset: 10/14/20 Previous Swallow Assessment: MBS 10/21/20 Diet Prior to this Study: Dysphagia 2 (chopped);Honey-thick liquids Temperature Spikes Noted: No Respiratory Status: Room air History of Recent Intubation: Yes Length of Intubations (days): 3 days Date extubated: 10/17/20 Behavior/Cognition: Alert;Cooperative Oral Cavity - Dentition: Missing dentition Self-Feeding Abilities: Able to feed self Vision: Functional for self-feeding Patient Positioning: Upright in chair/Tumbleform Baseline Vocal Quality: Hoarse Volitional Cough: Congested;Weak Volitional  Swallow: Able to elicit  Oral Care Assessment Does patient have any of the following "high(er) risk" factors?: None of the above Does patient have  any of the following "at risk" factors?: Other - dysphagia;Diet - patient on thickened liquids Patient is AT RISK: Order set for Adult Oral Care Protocol initiated -  "At Risk Patients" option selected (see row information) Ice Chips Ice chips: Within functional limits Presentation: Self Fed;Spoon Thin Liquid Thin Liquid: Impaired Presentation: Spoon;Cup;Self Fed Pharyngeal  Phase Impairments: Multiple swallows;Throat Clearing - Immediate Nectar Thick Nectar Thick Liquid: Not tested Honey Thick Honey Thick Liquid: Within functional limits Presentation: Cup;Self fed Puree Puree: Within functional limits Presentation: Self Fed;Spoon Solid Solid: Within functional limits Presentation: Self Fed;Spoon BSE Assessment Risk for Aspiration Impact on safety and function: Mild aspiration risk Other Related Risk Factors: Decreased respiratory status;Prolonged intubation  Short Term Goals: Week 1: SLP Short Term Goal 1 (Week 1): Pt will consume Dys 3 textures and honey thick liquids with minimal overt s/sx aspiration and efficient mastication and oral clearance with Supervision A verbal cues for use of swallowing strategies. SLP Short Term Goal 2 (Week 1): Pt will consume therapeutic trials of thin H2O with minimal overt s/sx aspiration and no appreciable change in vitals prior to repeat MBSS. SLP Short Term Goal 3 (Week 1): Pt will perform pharyngeal strengthening exercises with Supervision A verbal cues.  Refer to Care Plan for Long Term Goals  Recommendations for other services: None   Discharge Criteria: Patient will be discharged from SLP if patient refuses treatment 3 consecutive times without medical reason, if treatment goals not met, if there is a change in medical status, if patient makes no progress towards goals or if patient is discharged from hospital.  The above assessment, treatment plan, treatment alternatives and goals were discussed and mutually agreed upon: by patient and  his wife  Arbutus Leas 10/26/2020, 1:58 PM

## 2020-10-26 NOTE — Progress Notes (Signed)
Inpatient Rehabilitation  Patient information reviewed and entered into eRehab system by Julieana Eshleman M. Batul Diego, M.A., CCC/SLP, PPS Coordinator.  Information including medical coding, functional ability and quality indicators will be reviewed and updated through discharge.    

## 2020-10-26 NOTE — Progress Notes (Signed)
Initial Nutrition Assessment  DOCUMENTATION CODES:   Obesity unspecified  INTERVENTION:   - Magic cup TID with meals, each supplement provides 290 kcal and 9 grams of protein  - Encourage adequate PO intake  NUTRITION DIAGNOSIS:   Increased nutrient needs related to post-op healing,other (therapies) as evidenced by estimated needs.  GOAL:   Patient will meet greater than or equal to 90% of their needs  MONITOR:   PO intake,Supplement acceptance,Diet advancement,Labs,Weight trends  REASON FOR ASSESSMENT:   Consult Assessment of nutrition requirement/status  ASSESSMENT:   70 year old male with PMH of HTN, COPD, CAD. Pt was admitted on 10/14/20 for cath revealing severe CAD with in-stent restenosis. Pt underwent CABG x 4 on 2/3. Pt did have decline in mental status with difficulty swallowing on 2/7 and CT of head done revealing age-indeterminate left posterior cerebellar acute/subacute infarct. Pt made NPO with placement of Cortrak for nutritional support. MBS performed on 2/9 and pt was started on a dysphagia 2 diet with honey-thick liquids. Cortrak tube removed. Admitted to CIR on 2/13.   Spoke with pt at bedside. Pt's wife also at bedside and assisting in providing history.  Pt reports that he had Cortrak removed yesterday. He states that he has a sore throat that "comes and goes" which he attributes to being intubated and having Cortrak tube in place. Pt reports that his sore throat can affect his eating but that he has been eating better since Cortrak was removed. Pt's wife confirms this.  Pt willing to try vanilla or orange cream Magic Cups. He states that overall he likes the food but does not like the thickened milk. Will add this as a dislike.  Pt shares that his UBW is 220 lbs. He states that he currently weighs 214 lbs. Pt with moderate to severe edema to BLE. Difficult to determine dry weight at this time.  Weight history in chart shows pt's weight has been stable  between 96-100 kg over the last year.  Meal Completion: 100%  Medications reviewed and include: dulcolax, lasix, SSI, klor-con  Labs reviewed: BUN 32 CBG's: 77-101 x 24 hours  NUTRITION - FOCUSED PHYSICAL EXAM:  Flowsheet Row Most Recent Value  Orbital Region No depletion  Upper Arm Region No depletion  Thoracic and Lumbar Region No depletion  Buccal Region No depletion  Temple Region No depletion  Clavicle Bone Region Mild depletion  Clavicle and Acromion Bone Region Mild depletion  Scapular Bone Region Unable to assess  Dorsal Hand No depletion  Patellar Region No depletion  Anterior Thigh Region No depletion  Posterior Calf Region No depletion  Edema (RD Assessment) Moderate  [BLE]  Hair Reviewed  Eyes Reviewed  Mouth Reviewed  Skin Reviewed  Nails Reviewed       Diet Order:   Diet Order            DIET DYS 3 Room service appropriate? Yes; Fluid consistency: Honey Thick  Diet effective now                 EDUCATION NEEDS:   No education needs have been identified at this time  Skin:  Skin Assessment: Skin Integrity Issues: Incisions: chest, right leg  Last BM:  10/26/20  Height:   Ht Readings from Last 1 Encounters:  10/26/20 5\' 8"  (1.727 m)    Weight:   Wt Readings from Last 1 Encounters:  10/26/20 98.9 kg    BMI:  Body mass index is 33.15 kg/m.  Estimated Nutritional Needs:  Kcal:  2300-2500  Protein:  115-130 grams  Fluid:  2.0 L/day    Gustavus Bryant, MS, RD, LDN Inpatient Clinical Dietitian Please see AMiON for contact information.

## 2020-10-26 NOTE — Progress Notes (Addendum)
Inpatient Rehabilitation Medication Review by a Pharmacist  A complete drug regimen review was completed for this patient to identify any potential clinically significant medication issues.  Clinically significant medication issues were identified:  yes   Type of Medication Issue Identified Description of Issue Urgent (address now) Non-Urgent (address on AM team rounds) Plan Plan Accepted by Provider? (Yes / No / Pending AM Rounds)  Drug Interaction(s) (clinically significant)       Duplicate Therapy       Allergy       No Medication Administration End Date       Incorrect Dose       Additional Drug Therapy Needed  Patient discharged with pantoprazole. Please evaluate for restart  Will contact PA re: restart   Other         Name of provider notified for urgent issues identified:   Provider Method of Notification:    For non-urgent medication issues to be resolved on team rounds tomorrow morning a [facility] Secure Chat Handoff was sent to: Mar[place]e [place], PA   Pharmacist comments:   Time spent performing this drug regimen review (minutes):  10 minutes   Kearney Evitt A. Levada Dy, PharmD, BCPS, FNKF Clinical Pharmacist [place] Please utilize Amion for appropriate phone number to reach the unit pharmacist ([place])  10/26/2020 7:48 AM

## 2020-10-26 NOTE — Evaluation (Signed)
Occupational Therapy Assessment and Plan  Patient Details  Name: Jonathan [name] MRN: [id number] Date of Birth: September 03, 1951  OT Diagnosis: muscle weakness (generalized) Rehab Potential:   ELOS: 1 week   Today's Date: 10/26/2020 OT Individual Time: 1100-1220 OT Individual Time Calculation (min): 80 min     Hospital Problem: Principal Problem:   Cerebellar stroke [facility])   Past Medical History:  Past Medical History:  Diagnosis Date  . Arthritis    hands   . Chronic bronchitis ([place])   . Chronic sinusitis of both maxillary sinuses 04/16/2018   Sinus CT 04/25/2018 Mild mucoperiosteal thickening involving the frontoethmoidal sinuses. Otherwise largely clear sinuses with no evidence for acute sinusitis.   [name] COPD GOLD II   active smoker 04/16/2018   Spirometry 04/16/2018  FEV1 1.43 (47%)  Ratio 62  s prior rx  - 04/16/2018    try symbicort 160 2bid - PFT's  06/08/2018  FEV1 2.03 (71 % ) ratio 61  p 5 % improvement from saba p nothing prior to study with DLCO  63 % corrects to 72  % for alv volume    -  06/08/2018  After extensive coaching inhaler device,  effectiveness =    75%  (short ti/ variable flow) > continue symbicort 160 2bid   . Coronary artery disease   . Coronary artery disease involving native coronary artery of native heart with unstable angina pectoris ([place])   . DOE (dyspnea on exertion) 04/16/2018  . Essential hypertension, benign 09/23/2012   Changed acei to arb 04/16/2018 due to pseudowheeze > improved 06/08/2018   . Hernia, inguinal 09/23/2012  . Hyperlipidemia    has not tolerated statins,  would not pursue PSK9 inhibitor as advised by Dr Claiborne Billings  . Hypertension   . Myocardial infarction ([place])   . NSTEMI (non-ST elevated myocardial infarction) ([place]) 09/23/2012  . 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 Tobacco abuse    Past Surgical History:  Past Surgical History:  Procedure Laterality Date  . CARDIAC CATHETERIZATION    . CARDIOVASCULAR STRESS TEST  11/11/2011  . CORONARY ANGIOPLASTY      stents- 01/2004   . CORONARY ARTERY BYPASS GRAFT N/A 10/15/2020   Procedure: CORONARY ARTERY BYPASS GRAFTING (CABG) TIMES FOUR USING BILATERAL INTERNAL MAMMARY ARTERIES AND ENDOSCOPICALLY HARVESTED GREATER SAPHENOUS VEIN AND INSERTION OF 50CC INTRAAORTIC BALLOON PUMP;  Surgeon: [name], MD;  Location: [place];  Service: Open Heart Surgery;  Laterality: N/A;  possible BIMA  . ENDOVEIN HARVEST OF GREATER SAPHENOUS VEIN Right 10/15/2020   Procedure: ENDOVEIN HARVEST OF GREATER SAPHENOUS VEIN;  Surgeon: [name], MD;  Location: [place];  Service: Open Heart Surgery;  Laterality: Right;  . heart stents  01/2004  . INGUINAL HERNIA REPAIR Right 12/06/2012   Procedure: HERNIA REPAIR INGUINAL ADULT;  Surgeon: [name], DO;  Location: [facility];  Service: General;  Laterality: Right;  . INSERTION OF MESH Right 12/06/2012   Procedure: INSERTION OF MESH;  Surgeon: [name], DO;  Location: [facility];  Service: General;  Laterality: Right;  . LEFT HEART CATH AND CORONARY ANGIOGRAPHY N/A 07/22/2019   Procedure: LEFT HEART CATH AND CORONARY ANGIOGRAPHY;  Surgeon: [name], MD;  [facility]: [place] [facility];  Service: Cardiovascular;  Laterality: N/A;  . LEFT HEART CATH AND CORONARY ANGIOGRAPHY N/A 10/14/2020   Procedure: LEFT HEART CATH AND CORONARY ANGIOGRAPHY;  Surgeon: [name], MD;  Location: [place] [facility];  Service: Cardiovascular;  Laterality: N/A;  . TEE  WITHOUT CARDIOVERSION N/A 10/15/2020   Procedure: TRANSESOPHAGEAL ECHOCARDIOGRAM (TEE);  Surgeon: [name], MD;  Location: [place];  Service: Open Heart Surgery;  Laterality: N/A;    Assessment & Plan Clinical Impression: Patient is a 70 y.o. year old male with history of HTN, COPD, CAD--had refused CABG last year but continues to decline with progressive weakness, DOE and BLE pain. He reported that he could not walk household distances without shortness of breath or chest pain. He was admitted on 10/14/2020 for cath  revealing severe CAD with in-stent restenosis and underwent CABG x4 by Dr. Julien Girt on 10/15/2020. Postop had issues with volume overload requiring IV diuresis as well as confusion with productive cough and hypoxia. He did have decline in mental status with difficulty swallowing 02/07 and CT of head done revealing age-indeterminate left posterior cerebellar acute/subacute infarct. Bedside swallow showed increased WOB with p.o. trials and odynophagia and he was made n.p.o. with placement of feeding tube from nutritional support.   Neurology consulted for input and recommended MRI/MRI brain for work-up. This revealed small volume early subacute ischemic infarcts involving left cerebellar hemisphere, question focal severe proximal right MCA stenosis versus artifact and mild left ICA stenosis. 2D echo done showing EF 55 to 60% with evidence of protruding immobile plaque in descending aorta. Dr. Erlinda Hong felt the stroke was embolic versus cardio procedure related and recommended DAPT as well as 30-day cardiac monitor on outpatient basis to rule out arrhythmia as cause of stroke. 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 Emotional support has been provided by staff and at this time he is agreeable to current interventions. To alcohol as well as withdrawal from was made DNR multiple times. As respiratory status improved, MBS performed on 02/09 and patient was started on dysphagia 2 diet with honey liquids.Therapy has been ongoing and patient noted to be limited by weakness, tachycardia, DOE and sternal precautions.   Patient transferred to CIR on 10/25/2020 .    Patient currently requires mod with basic self-care skills secondary to muscle weakness, decreased cardiorespiratoy endurance and decreased  standing balance and decreased postural control.  Prior to hospitalization, patient could complete ADL with modified independent .  Patient will benefit from skilled intervention to decrease level of assist with basic self-care skills and increase independence with basic self-care skills prior to discharge home with care partner.  Anticipate patient will require intermittent supervision and follow up home health.  OT - End of Session Activity Tolerance: Tolerates 10 - 20 min activity with multiple rests Endurance Deficit: Yes Endurance Deficit Description: cardiorespiratory deficits following surgery; reduced general activity tolerance OT Assessment OT Patient demonstrates impairments in the following area(s): Balance;Edema;Endurance OT Basic ADL's Functional Problem(s): Grooming;Bathing;Dressing;Toileting OT Transfers Functional Problem(s): Toilet;Tub/Shower OT Plan OT Intensity: Minimum of 1-2 x/day, 45 to 90 minutes OT Frequency: 5 out of 7 days OT Duration/Estimated Length of Stay: 1 week OT Treatment/Interventions: Self Care/advanced ADL retraining;Therapeutic Exercise;DME/adaptive equipment instruction;Patient/family education;Discharge planning;Functional mobility training;Therapeutic Activities OT Self Feeding Anticipated Outcome(s): independent OT Basic Self-Care Anticipated Outcome(s): set up/supervision OT Toileting Anticipated Outcome(s): supervision OT Bathroom Transfers Anticipated Outcome(s): supervision OT Recommendation Patient destination: Home Follow Up Recommendations: Home health OT Equipment Recommended: 3 in 1 bedside comode Equipment Details: wife to measure built in shower seat - may need transfer bench   OT Evaluation Precautions/Restrictions  Precautions Precautions: Fall;Sternal Restrictions Weight Bearing Restrictions: No General   Vital Signs Therapy Vitals Temp: 97.6 F (36.4 C) Pulse Rate: 88 Resp: 17 BP: 140/71 Patient Position (if  appropriate): Sitting Oxygen Therapy SpO2: (!) 89 % O2 Device: Room Air Pain Pain Assessment Pain Scale: 0-10 Pain Score: 1  Faces Pain Scale: No hurt Pain Type: Surgical pain Pain Location: Chest Pain Orientation: Mid Pain Descriptors / Indicators: Tender Pain Intervention(s): Repositioned Home Living/Prior Functioning Home Living Family/patient expects to be discharged to:: Private residence Living Arrangements: Spouse/significant other Available Help at Discharge: Family,Available 24 hours/day Type of Home: House Home Access: Stairs to enter [employer] of Steps: 2 small steps and threshold Entrance Stairs-Rails: None Home Layout: One level Bathroom Shower/Tub: Multimedia programmer: Programmer, systems: Yes  Lives With: Spouse Prior Function Level of Independence: Independent with basic ADLs,Independent with homemaking with ambulation,Independent with gait,Independent with transfers  Able to Take Stairs?: Yes Driving: Yes Vocation: Retired Leisure: Hobbies-yes (Comment) Comments: lawn care Vision Baseline Vision/History: Wears glasses Wears Glasses: At all times Patient Visual Report: No change from baseline Vision Assessment?: Yes Eye Alignment: Within Functional Limits Ocular Range of Motion: Within Functional Limits Alignment/Gaze Preference: Within Defined Limits Tracking/Visual Pursuits: Able to track stimulus in all quads without difficulty Saccades: Within functional limits Convergence: Within functional limits Visual Fields: No apparent deficits Perception  Perception: Within Functional Limits Praxis Praxis: Intact Cognition Overall Cognitive Status: Within Functional Limits for tasks assessed Arousal/Alertness: Awake/alert Orientation Level: Person;Place;Situation Person: Oriented Place: Oriented Situation: Oriented Year: 2022 Month: February Day of Week: Correct Memory: Appears intact Immediate Memory Recall:  Sock;Blue;Bed Memory Recall Sock: Without Cue Memory Recall Blue: Without Cue Memory Recall Bed: Without Cue Awareness: Appears intact Safety/Judgment: Appears intact Sensation Sensation Light Touch: Appears Intact Hot/Cold: Appears Intact Coordination Gross Motor Movements are Fluid and Coordinated: Yes Fine Motor Movements are Fluid and Coordinated: Yes Finger Nose Finger Test: [facility] Motor  Motor Motor: Within Functional Limits Motor - Skilled Clinical Observations: slow due to generalized weakness and occ cues for sternal precautions  Trunk/Postural Assessment  Cervical Assessment Cervical Assessment: Exceptions to [facility] (forward head) Thoracic Assessment Thoracic Assessment: Exceptions to [facility] (rounded shoulders) Postural Control Postural Control: Within Functional Limits  Balance Balance Balance Assessed: Yes Standardized Balance Assessment Standardized Balance Assessment: Berg Balance Test Static Sitting Balance Static Sitting - Level of Assistance: 5: Stand by assistance Dynamic Sitting Balance Dynamic Sitting - Level of Assistance: 5: Stand by assistance Static Standing Balance Static Standing - Level of Assistance: 4: Min assist Dynamic Standing Balance Dynamic Standing - Level of Assistance: 4: Min assist Extremity/Trunk Assessment RUE Assessment RUE Assessment: Within Functional Limits General Strength Comments: intact to 90 - no OH reach due to sternal precautions LUE Assessment LUE Assessment: Within Functional Limits General Strength Comments: intact to 90 - no OH reach due to sternal precautions  Care Tool Care Tool Self Care Eating   Eating Assist Level: Supervision/Verbal cueing    Oral Care    Oral Care Assist Level: Supervision/Verbal cueing    Bathing   Body parts bathed by patient: Right arm;Left arm;Chest;Abdomen;Front perineal area;Buttocks;Right upper leg;Left upper leg;Face Body parts bathed by helper: Buttocks;Right lower leg;Left lower  leg   Assist Level: Moderate Assistance - Patient 50 - 74%    Upper Body Dressing(including orthotics)   What is the patient wearing?: Button up shirt   Assist Level: Moderate Assistance - Patient 50 - 74%    Lower Body Dressing (excluding footwear)  What is the patient wearing?: Pants;Incontinence brief Assist for lower body dressing: Maximal Assistance - Patient 25 - 49%    Putting on/Taking off footwear   What is the patient wearing?: Non-skid slipper socks Assist for footwear: Dependent - Patient 0%       Care Tool Toileting Toileting activity   Assist for toileting: Minimal Assistance - Patient > 75%     Care Tool Bed Mobility Roll left and right activity   Roll left and right assist level: Minimal Assistance - Patient > 75%    Sit to lying activity   Sit to lying assist level: Minimal Assistance - Patient > 75%    Lying to sitting edge of bed activity   Lying to sitting edge of bed assist level: Minimal Assistance - Patient > 75%     Care Tool Transfers Sit to stand transfer   Sit to stand assist level: Contact Guard/Touching assist    Chair/bed transfer   Chair/bed transfer assist level: Contact Guard/Touching assist     Toilet transfer   Assist Level: Contact Guard/Touching assist     Care Tool Cognition Expression of Ideas and Wants Expression of Ideas and Wants: Without difficulty (complex and basic) - expresses complex messages without difficulty and with speech that is clear and easy to understand   Understanding Verbal and Non-Verbal Content Understanding Verbal and Non-Verbal Content: Understands (complex and basic) - clear comprehension without cues or repetitions   Memory/Recall Ability *first 3 days only Memory/Recall Ability *first 3 days only: Current season;Location of own room;That he or she is in a hospital/hospital unit    Refer to Care Plan for [place] 1 OT Short Term Goal 1 (Week 1): STG = LTG due to  estimated LOS  Recommendations for other services: None    Skilled Therapeutic Intervention  Patient finishing with nursing in bathroom at start of session.  Wife present.  Patient ambulating with RW CGA, completes hand hygiene with CGA and returned to bed surface for rest.  Sit to supine with mod A for bilateral legs into bed and positioning due to sternal precautions.  Reviewed role of OT and plan for today - evaluation completed as documented above.  Patient presents with generalized weakness and poor endurance limiting his ability to complete self care and mobility.  He is pleasant and cooperative with good sense of humor and eagerness to participate in therapy programming.  Reviewed plan of care and goals for therapy.  Patient engaged in adl training and mobility in room - overall requires assistance due to limited reach/sternal precautions and fatigue.  He will benefit from assistive device training.   Reviewed DME options with patient and wife and she is planning to take some measurements at home this evening.  Provided cushion for recliner and discussed weight shift and change of position for skin integrity and pain control.  Bilateral lower legs significantly swollen - reviewed elevation, ROM and applied ace wraps after bathing.  Patient and wife demonstrate good understanding.  Patient remained seated in recliner at close of session, seat alarm set and call bell, suction in hand.    ADL ADL Eating: Set up;Supervision/safety Where Assessed-Eating: Chair Grooming: Supervision/safety;Setup Where Assessed-Grooming: Sitting at sink Upper Body Bathing: Minimal assistance Where Assessed-Upper Body Bathing: Sitting at sink Lower Body Bathing: Maximal assistance Where Assessed-Lower Body Bathing: Sitting at sink Upper Body Dressing: Moderate assistance Where Assessed-Upper Body Dressing: Wheelchair Lower Body Dressing: Maximal assistance Where Assessed-Lower Body Dressing:  Wheelchair Toileting: Minimal assistance Where Assessed-Toileting: Editor, commissioning Method: Counselling psychologist: Grab bars;Raised toilet seat Mobility  Bed Mobility Bed Mobility: Supine to Sit;Sit to Supine Supine to Sit: Moderate Assistance - Patient 50-74% Sit to Supine: Moderate Assistance - Patient 50-74% Transfers Sit to Stand: Contact Guard/Touching assist Stand to Sit: Contact Guard/Touching assist   Discharge Criteria: Patient will be discharged from OT if patient refuses treatment 3 consecutive times without medical reason, if treatment goals not met, if there is a change in medical status, if patient makes no progress towards goals or if patient is discharged from hospital.  The above assessment, treatment plan, treatment alternatives and goals were discussed and mutually agreed upon: by patient and by family  Carlos Levering 10/26/2020, 4:17 PM

## 2020-10-26 NOTE — Progress Notes (Signed)
Physical Therapy Assessment and Plan  Patient Details  Name: Jonathan [name] MRN: [id number] Date of Birth: [birth year]  PT Diagnosis: Abnormality of gait, Coordination disorder, Difficulty walking, Edema, Muscle weakness and Pain in chest at surgical site Rehab Potential: Good ELOS: 7-10 days   Today's Date: 10/26/2020 PT Individual Time:  0900-1000 PT Individual Time Calculation (min): 60 min    Hospital Problem: Principal Problem:   Cerebellar stroke [facility])   Past Medical History:  Past Medical History:  Diagnosis Date  . Arthritis    hands   . Chronic bronchitis ([place])   . Chronic sinusitis of both maxillary sinuses 04/16/2018   Sinus CT 04/25/2018 Mild mucoperiosteal thickening involving the frontoethmoidal sinuses. Otherwise largely clear sinuses with no evidence for acute sinusitis.   [name] COPD GOLD II   active smoker 04/16/2018   Spirometry 04/16/2018  FEV1 1.43 (47%)  Ratio 62  s prior rx  - 04/16/2018    try symbicort 160 2bid - PFT's  06/08/2018  FEV1 2.03 (71 % ) ratio 61  p 5 % improvement from saba p nothing prior to study with DLCO  63 % corrects to 72  % for alv volume    -  06/08/2018  After extensive coaching inhaler device,  effectiveness =    75%  (short ti/ variable flow) > continue symbicort 160 2bid   . Coronary artery disease   . Coronary artery disease involving native coronary artery of native heart with unstable angina pectoris ([place])   . DOE (dyspnea on exertion) 04/16/2018  . Essential hypertension, benign 09/23/2012   Changed acei to arb 04/16/2018 due to pseudowheeze > improved 06/08/2018   . Hernia, inguinal 09/23/2012  . Hyperlipidemia    has not tolerated statins,  would not pursue PSK9 inhibitor as advised by Dr Claiborne Billings  . Hypertension   . Myocardial infarction ([place])   . NSTEMI (non-ST elevated myocardial infarction) ([place]) 09/23/2012  . 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 Tobacco abuse    Past Surgical History:  Past Surgical History:  Procedure Laterality Date  .  CARDIAC CATHETERIZATION    . CARDIOVASCULAR STRESS TEST  11/11/2011  . CORONARY ANGIOPLASTY     stents- 01/2004   . CORONARY ARTERY BYPASS GRAFT N/A 10/15/2020   Procedure: CORONARY ARTERY BYPASS GRAFTING (CABG) TIMES FOUR USING BILATERAL INTERNAL MAMMARY ARTERIES AND ENDOSCOPICALLY HARVESTED GREATER SAPHENOUS VEIN AND INSERTION OF 50CC INTRAAORTIC BALLOON PUMP;  Surgeon: [name], MD;  Location: [place];  Service: Open Heart Surgery;  Laterality: N/A;  possible BIMA  . ENDOVEIN HARVEST OF GREATER SAPHENOUS VEIN Right 10/15/2020   Procedure: ENDOVEIN HARVEST OF GREATER SAPHENOUS VEIN;  Surgeon: [name], MD;  Location: [place];  Service: Open Heart Surgery;  Laterality: Right;  . heart stents  01/2004  . INGUINAL HERNIA REPAIR Right 12/06/2012   Procedure: HERNIA REPAIR INGUINAL ADULT;  Surgeon: [name], DO;  Location: [facility];  Service: General;  Laterality: Right;  . INSERTION OF MESH Right 12/06/2012   Procedure: INSERTION OF MESH;  Surgeon: [name], DO;  Location: [facility];  Service: General;  Laterality: Right;  . LEFT HEART CATH AND CORONARY ANGIOGRAPHY N/A 07/22/2019   Procedure: LEFT HEART CATH AND CORONARY ANGIOGRAPHY;  Surgeon: [name], MD;  Location: [place] [facility];  Service: Cardiovascular;  Laterality: N/A;  . LEFT HEART CATH AND CORONARY ANGIOGRAPHY N/A 10/14/2020   Procedure: LEFT HEART CATH AND CORONARY ANGIOGRAPHY;  Surgeon: [name], MD;  Location:  [place] [facility];  Service: Cardiovascular;  Laterality: N/A;  . TEE WITHOUT CARDIOVERSION N/A 10/15/2020   Procedure: TRANSESOPHAGEAL ECHOCARDIOGRAM (TEE);  Surgeon: [name], MD;  Location: [place];  Service: Open Heart Surgery;  Laterality: N/A;    Assessment & Plan Clinical Impression: Patient is a 70 y.o. male with h/o HTN, COPD, CAD--had refused CABG last year but continued to decline with progressive weakness, DOE and BLE pain. He reported that he could not walk household distances without  SOB or chest pain. He was admitted on 10/14/2020 for cath revealing severe CAD with in-stent restenosis and underwent CABG x4 by Dr. Julien Girt on 10/15/2020. Postop had issues with volume overload requiring IV diuresis as well as confusion with productive cough and hypoxia. He did have decline in mental status with difficulty swallowing 02/07 and CT of head done revealing age-indeterminate left posterior cerebellar acute/subacute infarct. Bedside swallow showed increased WOB with p.o. trials and odynophagia and he was made n.p.o. with placement of feeding tube from nutritional support.   Neurology consulted for input and recommended MRI/MRI brain for work-up. This revealed small volume early subacute ischemic infarcts involving left cerebellar hemisphere, question focal severe proximal right MCA stenosis versus artifact and mild left ICA stenosis. 2D echo done showing EF 55 to 60% with evidence of protruding immobile plaque in descending aorta. Dr. Erlinda Hong felt the stroke was embolic versus cardio procedure related and recommended DAPT as well as 30-day cardiac monitor on outpatient basis to rule out arrhythmia as cause of stroke. 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 Emotional support has been provided by staff and at this time he is agreeable to current interventions. To alcohol as well as withdrawal from was made DNR multiple times. As respiratory status improved, MBS performed on 02/09 and patient was started on dysphagia 2 diet with honey liquids.Therapy has been ongoing and patient noted to be limited by weakness, tachycardia, DOE and sternal precautions.  Patient transferred to CIR on 10/25/2020 .   Patient currently requires min Awith mobility secondary to muscle weakness, decreased  cardiorespiratoy endurance, unbalanced muscle activation and decreased motor planning, ,, decreased initiation, decreased awareness, decreased problem solving and decreased safety awareness and decreased standing balance, decreased balance strategies and difficulty maintaining precautions.  Prior to hospitalization, patient was modified independent  with mobility and lived with Spouse in a House home.  Home access is 1Stairs to enter.  Patient will benefit from skilled PT intervention to maximize safe functional mobility, minimize fall risk and decrease caregiver burden for planned discharge home with 24 hour assist.  Anticipate patient will benefit from follow up [facility] at discharge.    PT Evaluation Precautions/Restrictions Precautions Precautions: Fall;Sternal Restrictions Weight Bearing Restrictions: No Pain Pain Assessment Pain Scale: 0-10 Pain Score: 0-No pain Pain Location: Chest Pain Orientation: Mid Pain Descriptors / Indicators: Sore Pain Onset: On-going Patients Stated Pain Goal: 0 Pain Intervention(s): Medication (See eMAR) Home Living/Prior Functioning Home Living Available Help at Discharge: Family;Available 24 hours/day Type of Home: House Home Access: Stairs to enter [employer] of Steps: 1 Entrance Stairs-Rails: None Home Layout: One level (ranch) Bathroom Shower/Tub: Research officer, [employer] Accessibility: Yes  Lives With: Spouse Prior Function Level of Independence: Independent with basic ADLs;Independent with homemaking with ambulation;Independent with gait;Independent with transfers  Able to Take Stairs?: Yes Driving:  Yes Vocation: Retired Leisure: Hobbies-yes (Comment) Comments: lawn care Vision/Perception  Perception Perception: Within Functional Limits Praxis Praxis: Intact  Cognition Overall Cognitive Status: Within Functional Limits for tasks assessed Arousal/Alertness: Awake/alert Orientation Level: Oriented  X4 Sensation Sensation Light Touch: Appears Intact Coordination Gross Motor Movements are Fluid and Coordinated: Yes Fine Motor Movements are Fluid and Coordinated: Yes Motor  Motor Motor: Within Functional Limits   Trunk/Postural Assessment  Cervical Assessment Cervical Assessment: Exceptions to [facility] (forward head) Thoracic Assessment Thoracic Assessment: Exceptions to [facility] (rounded shoulders) Postural Control Postural Control: Within Functional Limits  Balance Balance Balance Assessed: Yes Standardized Balance Assessment Standardized Balance Assessment: Berg Balance Test  Berg Balance Test Score: 22/ 56 Static Sitting Balance Static Sitting - Balance Support: Feet supported Static Sitting - Level of Assistance: 5: Stand by assistance Dynamic Sitting Balance Dynamic Sitting - Balance Support: Feet supported; Bilateral upper extremity supported Dynamic Sitting - Level of Assistance: 5: Stand by assistance Static Standing Balance Static Standing - Balance Support: During functional activity; Bilateral upper extremity supported Static Standing - Level of Assistance: 4: Min assist Dynamic Standing Balance Dynamic Standing - Balance Support: During functional activity; Bilateral upper extremity supported Dynamic Standing - Level of Assistance: 4: Min assist Extremity Assessment  RLE Assessment RLE Assessment: Within Functional Limits General Strength Comments: 4/5 LLE Assessment LLE Assessment: Within Functional Limits General Strength Comments: 4/5    Care Tool Care Tool Bed Mobility Roll left and right activity   Roll left and right assist level: Minimal Assistance - Patient > 75%    Sit to lying activity   Sit to lying assist level: Minimal Assistance - Patient > 75%    Lying to sitting edge of bed activity   Lying to sitting edge of bed assist level: Minimal Assistance - Patient > 75%     Care Tool Transfers Sit to stand transfer   Sit to stand assist level:  Contact Guard/Touching assist    Chair/bed transfer   Chair/bed transfer assist level: Contact Guard/Touching assist     Toilet transfer   Assist Level: Contact Guard/Touching assist    Car transfer   Car transfer assist level: Minimal Assistance - Patient > 75%      Care Tool Locomotion Ambulation   Assist level: Contact Guard/Touching assist Assistive device: Walker-rolling    Walk 10 feet activity   Assist level: Contact Guard/Touching assist Assistive device: Walker-rolling   Walk 50 feet with 2 turns activity   Assist level: Contact Guard/Touching assist Assistive device: Walker-rolling  Walk 150 feet activity   Assist level: Contact Guard/Touching assist Assistive device: Walker-rolling  Walk 10 feet on uneven surfaces activity Walk 10 feet on uneven surfaces activity did not occur: Safety/medical concerns      Stairs Stair activity did not occur: Safety/medical concerns        Walk up/down 1 step activity Walk up/down 1 step or curb (drop down) activity did not occur: Safety/medical concerns     Walk up/down 4 steps activity did not occuR: Safety/medical concerns  Walk up/down 4 steps activity      Walk up/down 12 steps activity Walk up/down 12 steps activity did not occur: N/A      Pick up small objects from floor Pick up small object from the floor (from standing position) activity did not occur: Safety/medical concerns      Wheelchair Will patient use wheelchair at discharge?: No          Wheel 50 feet with 2 turns activity  Wheel 150 feet activity        Refer to Care Plan for Long Term Goals  SHORT TERM GOAL WEEK 1 STG = LTG d/t ELOS  Recommendations for other services: Neuropsych  Skilled Therapeutic Intervention  Patient seated upright in recliner and eating breakfast under supervision of NT upon PT arrival. Patient alert and agreeable to PT session. Patient c/o 1/10 soreness at incision site on chest at start of session.    Therapeutic Activity: Bed Mobility: Patient required cues to initiate bed mobility. Provided with instructions for sit to sidelying and then performing log roll to supine position. Pt requires Min A to perform. Despite instructions to perform in reverse, pt attempts to bring BLE off bed surface first and then modified side roll/ abdominal flexion to sit upright on EOB to return to seated position requiring Min/ Mod A to complete.  Transfers: Patient performed STS and SPVT transfers to RW with CGA and vc for forward lean and exhale on exertion to stand. Pt requires multiple attempts initially and is able to improve to completing transfer with rocking for momentum and hands on thighs.   Gait Training:  Patient ambulated 250' x1/ 130' x1 using RW with CGA. Ambulated with slow pace, decreased step height/ length, increased sway. Required vc for  proximity to walker, and maintaining midline orientation to walker.  Pt experiences episodes of coughing throughout session d/t surgery as well as irritation from n/g tube in place during pt's stay in acute care. Pt requires extensive time to recover from drop in O2 saturation, pillow to chest for adequate pressure relief. O2 sats rise quickly with resolution of coughing from 85% to WNL within 1 minute.   Patient seated in recliner at end of session with brakes locked, chair alarm set, and all needs within reach.    Mobility Bed Mobility Bed Mobility: Supine to Sit;Sit to Supine Supine to Sit: Minimal Assistance - Patient > 75% Sit to Supine: Minimal Assistance - Patient > 75% Transfers Transfers: Sit to Stand;Stand to Sit;Stand Pivot Transfers Sit to Stand: Contact Guard/Touching assist Stand to Sit: Contact Guard/Touching assist Stand Pivot Transfers: Contact Guard/Touching assist Transfer (Assistive device): Rolling walker Locomotion  Gait Ambulation: Yes Gait Distance (Feet): 250 Feet Assistive device: Rolling walker Gait Gait: Yes Gait  Pattern: Within Functional Limits;Step-through pattern;Decreased step length - right;Decreased step length - left;Decreased stride length;Decreased hip/knee flexion - right;Decreased hip/knee flexion - left;Decreased dorsiflexion - right;Decreased dorsiflexion - left Gait velocity: reduced from baseline and for age   Discharge Criteria: Patient will be discharged from PT if patient refuses treatment 3 consecutive times without medical reason, if treatment goals not met, if there is a change in medical status, if patient makes no progress towards goals or if patient is discharged from hospital.  The above assessment, treatment plan, treatment alternatives and goals were discussed and mutually agreed upon: by patient  Alger Simons 10/26/2020, 12:52 PM

## 2020-10-27 DIAGNOSIS — I639 Cerebral infarction, unspecified: Secondary | ICD-10-CM | POA: Diagnosis not present

## 2020-10-27 LAB — GLUCOSE, CAPILLARY
Glucose-Capillary: 104 mg/dL — ABNORMAL HIGH (ref 70–99)
Glucose-Capillary: 82 mg/dL (ref 70–99)
Glucose-Capillary: 90 mg/dL (ref 70–99)
Glucose-Capillary: 86 mg/dL (ref 70–99)
Glucose-Capillary: 126 mg/dL — ABNORMAL HIGH (ref 70–99)

## 2020-10-27 NOTE — Progress Notes (Signed)
Occupational Therapy Session Note  Patient Details  Name: Jonathan [name] MRN: [id number] Date of Birth: [birth year]  Today's Date: 10/27/2020 OT Individual Time: 1103-1207 OT Individual Time Calculation (min): 64 min    Short Term Goals: Week 1:  OT Short Term Goal 1 (Week 1): STG = LTG due to estimated LOS  Skilled Therapeutic Interventions/Progress Updates:    Pt received sitting EOB with wife present, agreeable to therapy, req to toilet. STS and amb to toilet to complete toilet transfer with RW + close S. Cont void of b/b. Total A for posterior peri care. Doffed brief/pants with close S. Donned new pants with close S, total A to don new brief. Donned button up shirt with mod A. Stood to brush teeth and complete grooming at sink with close S. Reports mod RPE as an 8/10, discussed energy conservaiton re: seated rest breaks, RPE, bathing. Discussed DME recs for bathroom at home, TBD if pt will need 3in1 or shower seat as pt has elevated height toilet + built in shower seat. Transported to therapy apartment in w/c and practiced simulated walk-in shower transfer with wooden lip x2 with RW + CGA. Wife facilitated transfer x1. Min VCs throughout session to follow sternal precautions, good recall overall. Transferred to recliner and left with chair alarm engaged, wife present, call bell in reach, and all immediate needs met.    Therapy Documentation Precautions:  Precautions Precautions: Fall,Sternal Restrictions Weight Bearing Restrictions: No RUE Weight Bearing: Weight bearing as tolerated LLE Weight Bearing: Weight bearing as tolerated Pain: Pain Assessment Pain Scale: 0-10 Pain Score: 0-No pain ADL: See Care Tool for more details.   Therapy/Group: Individual Therapy  Volanda Napoleon MS, OTR/L  10/27/2020, 12:35 PM

## 2020-10-27 NOTE — Progress Notes (Signed)
Physical Therapy Session Note  Patient Details  Name: Jonathan [name] MRN: [id number] Date of Birth: [birth year]  Today's Date: 10/27/2020 PT Individual Time: 0901-1000 PT Individual Time Calculation (min): 59 min   Short Term Goals: Week 1:  PT Short Term Goal 1 (Week 1): STG = LTG d/t ELOS PT Short Term Goal 1 - Progress (Week 1): Progressing toward goal  Skilled Therapeutic Interventions/Progress Updates:    Patient seated on toilet upon PT arrival to room. Patient alert and agreeable to PT session. Patient denied pain during session. SpO2 checked throughout especially after coughing and after exertions, with lowest readings 89-91% and quickly rise to 94% with rest and PLB/ calm breathing.   Therapeutic Activity: Transfers: Patient performed STS and SPVT transfers throughout session with good use of hands on thighs or split hand positioning with one hand on thigh and one on RW. Toilet transfer performed with supervision and is able to maintain sternal precautions. Pericare performed with supervision. Requires Mod/ Max A for donning new brief and surgical scrub pants.   Car transfer completed with visual demonstration and vc prior to pt attempt. Pt requires Min A for BLE into and back out of car. Car was set to 29" seat height, but pt believes seat height is higher than practiced height. Later in session, pt remembers that wife's vehicle is a Therapist, occupational. Wife vehicle is equipped with special steering for her neuropathy.   Gait Training:  Patient ambulated short distances in room as well as 185' x1/ 67'x1/ 128' x1 using RW with CGA. Ambulated with decreased step length/ height, forward flexed posture, decreased pace. VC required for conserving energy to reach further distances, increasing step/ height/ length, and maintaining good proximity to walker with upright posture and level gaze.   Pt is able to ambulate up and down ramp with good control of speed throughout. Completes with RW and  CGA. No downward pressure noted into RW. Pt is also able to step over small object on floor,   Pt seated on EOB at end of session with brakes locked, bed alarm set, all needs within reach, and breakfast in front of pt. Pt's wife is arriving for the day.   Therapy Documentation Precautions:  Precautions Precautions: Fall,Sternal Restrictions Weight Bearing Restrictions: No RUE Weight Bearing: Weight bearing as tolerated LLE Weight Bearing: Weight bearing as tolerated   Vital Signs: Oxygen Therapy SpO2: 89-91 % throughout session O2 Device: Room Air  Therapy/Group: Individual Therapy  Alger Simons 10/27/2020, 4:17 PM

## 2020-10-27 NOTE — Progress Notes (Signed)
Inpatient Rehabilitation Care Coordinator Assessment and Plan Patient Details  Name: Jonathan [name] MRN: [id number] Date of Birth: 1951-08-23  Today's Date: 10/27/2020  Hospital Problems: Principal Problem:   Cerebellar stroke [facility])  Past Medical History:  Past Medical History:  Diagnosis Date  . Arthritis    hands   . Chronic bronchitis ([place])   . Chronic sinusitis of both maxillary sinuses 04/16/2018   Sinus CT 04/25/2018 Mild mucoperiosteal thickening involving the frontoethmoidal sinuses. Otherwise largely clear sinuses with no evidence for acute sinusitis.   [name] COPD GOLD II   active smoker 04/16/2018   Spirometry 04/16/2018  FEV1 1.43 (47%)  Ratio 62  s prior rx  - 04/16/2018    try symbicort 160 2bid - PFT's  06/08/2018  FEV1 2.03 (71 % ) ratio 61  p 5 % improvement from saba p nothing prior to study with DLCO  63 % corrects to 72  % for alv volume    -  06/08/2018  After extensive coaching inhaler device,  effectiveness =    75%  (short ti/ variable flow) > continue symbicort 160 2bid   . Coronary artery disease   . Coronary artery disease involving native coronary artery of native heart with unstable angina pectoris ([place])   . DOE (dyspnea on exertion) 04/16/2018  . Essential hypertension, benign 09/23/2012   Changed acei to arb 04/16/2018 due to pseudowheeze > improved 06/08/2018   . Hernia, inguinal 09/23/2012  . Hyperlipidemia    has not tolerated statins,  would not pursue PSK9 inhibitor as advised by Dr Claiborne Billings  . Hypertension   . Myocardial infarction ([place])   . NSTEMI (non-ST elevated myocardial infarction) ([place]) 09/23/2012  . 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 Tobacco abuse    Past Surgical History:  Past Surgical History:  Procedure Laterality Date  . CARDIAC CATHETERIZATION    . CARDIOVASCULAR STRESS TEST  11/11/2011  . CORONARY ANGIOPLASTY     stents- 01/2004   . CORONARY ARTERY BYPASS GRAFT N/A 10/15/2020   Procedure: CORONARY ARTERY BYPASS GRAFTING (CABG) TIMES FOUR USING  BILATERAL INTERNAL MAMMARY ARTERIES AND ENDOSCOPICALLY HARVESTED GREATER SAPHENOUS VEIN AND INSERTION OF 50CC INTRAAORTIC BALLOON PUMP;  Surgeon: [name], MD;  Location: [place];  Service: Open Heart Surgery;  Laterality: N/A;  possible BIMA  . ENDOVEIN HARVEST OF GREATER SAPHENOUS VEIN Right 10/15/2020   Procedure: ENDOVEIN HARVEST OF GREATER SAPHENOUS VEIN;  Surgeon: [name], MD;  Location: [place];  Service: Open Heart Surgery;  Laterality: Right;  . heart stents  01/2004  . INGUINAL HERNIA REPAIR Right 12/06/2012   Procedure: HERNIA REPAIR INGUINAL ADULT;  Surgeon: [name], DO;  Location: [facility];  Service: General;  Laterality: Right;  . INSERTION OF MESH Right 12/06/2012   Procedure: INSERTION OF MESH;  Surgeon: [name], DO;  Location: [facility];  Service: General;  Laterality: Right;  . LEFT HEART CATH AND CORONARY ANGIOGRAPHY N/A 07/22/2019   Procedure: LEFT HEART CATH AND CORONARY ANGIOGRAPHY;  Surgeon: [name], MD;  Location: [place] [facility];  Service: Cardiovascular;  Laterality: N/A;  . LEFT HEART CATH AND CORONARY ANGIOGRAPHY N/A 10/14/2020   Procedure: LEFT HEART CATH AND CORONARY ANGIOGRAPHY;  Surgeon: [name], MD;  Location: [place] [facility];  Service: Cardiovascular;  Laterality: N/A;  . TEE WITHOUT CARDIOVERSION N/A 10/15/2020   Procedure: TRANSESOPHAGEAL ECHOCARDIOGRAM (TEE);  Surgeon: [name], MD;  Location: [place];  Service: Open Heart Surgery;  Laterality: N/A;  Social History:  reports that he has been smoking cigarettes. He has a 53.00 pack-year smoking history. He has never used smokeless tobacco. He reports current alcohol use of about 5.0 standard drinks of alcohol per week. He reports that he does not use drugs.  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   Abuse/Neglect Abuse/Neglect Assessment Can Be Completed: Yes Physical Abuse: Denies Verbal Abuse: Denies Sexual Abuse: Denies Exploitation of patient/patient's resources: Denies Self-Neglect: Denies  Emotional Status Recent Psychosocial Issues: no Psychiatric History: no Substance Abuse History: Tobacco Use  Patient / Family Perceptions, Expectations & Goals Pt/Family understanding of illness & functional limitations: yes Premorbid pt/family roles/activities: previosuly independent Anticipated changes in roles/activities/participation: supervision Pt/family expectations/goals: Warden/ranger Agencies: None Premorbid Home Care/DME Agencies: Other (Comment) (single point cane) Transportation available at discharge: family able to [employer] referrals recommended: Neuropsychology (Coping/ Active Tobacco Use)  Discharge Planning Living Arrangements: Spouse/significant other Support Systems: Spouse/significant other Type of Residence: Private residence (1 level home) Insurance Resources: Multimedia programmer (specify) ([employer]HC MEDICARE) Financial Resources: Employment Financial Screen Referred: No Living Expenses: Own Money Management: Patient,Spouse Does the patient have any problems obtaining your medications?: No Home Management: independent Care Coordinator Anticipated Follow Up Needs: HH/OP Expected length of stay: 2 weeks  Clinical Impression SW met with patient and spouse in room. Introduced self, explained role and addressed questions and concerns. SW will continue to follow up.   Dyanne Iha 10/27/2020, 1:56 PM

## 2020-10-27 NOTE — Progress Notes (Signed)
Speech Language Pathology Daily Session Note  Patient Details  Name: Jonathan [name] MRN: [id number] Date of Birth: 09-10-1951  Today's Date: 10/27/2020 SLP Individual Time: 0315-9458 SLP Individual Time Calculation (min): 42 min  Short Term Goals: Week 1: SLP Short Term Goal 1 (Week 1): Pt will consume Dys 3 textures and honey thick liquids with minimal overt s/sx aspiration and efficient mastication and oral clearance with Supervision A verbal cues for use of swallowing strategies. SLP Short Term Goal 2 (Week 1): Pt will consume therapeutic trials of thin H2O with minimal overt s/sx aspiration and no appreciable change in vitals prior to repeat MBSS. SLP Short Term Goal 3 (Week 1): Pt will perform pharyngeal strengthening exercises with Supervision A verbal cues.  Skilled Therapeutic Interventions: Pt was seen for skilled ST targeting dysphagia goals. SLP facilitated session with set up assist for pt to complete oral care via suction toothbrush. He then accepted ~6oz thin H2O via cup (self-fed) without any overt s/sx aspiration, although intermittently multiple swallows per bolus noted. Pt also consumed his recently upgraded dysphagia 3 (mechanical soft) texture breakfast tray with honey thick liquids without overt s/sx aspiration. He wad Mod I for use of safe swallow and sternal precautions throughout session. His mastication and oral clearance was efficient. SLP also introduced pharyngeal strengthening exercise (effortful swallow), which pt returned demonstration with Min A verbal cueing. He still presents with intermittent cough (not tied to intake), which produces a small degree of moderately viscous secretions. He manages his secretions independently with oral suction. Pt left sitting edge of bed with bed alarm set to lowest setting and needs within reach. Continue per current plan of care.        Pain Pain Assessment Pain Scale: 0-10 Pain Score: 0-No pain  Therapy/Group: Individual  Therapy  Arbutus Leas 10/27/2020, 7:21 AM

## 2020-10-27 NOTE — Plan of Care (Signed)
Problem: RH Balance Goal: LTG Patient will maintain dynamic sitting balance (PT) Description: LTG:  Patient will maintain dynamic sitting balance with assistance during mobility activities (PT) 10/27/2020 1612 by Alger Simons, PT Outcome: Progressing 10/27/2020 1607 by Alger Simons, PT Outcome: Progressing Flowsheets (Taken 10/27/2020 1607) LTG: Pt will maintain dynamic sitting balance during mobility activities with:: Independent with assistive device  Goal: LTG Patient will maintain dynamic standing balance (PT) Description: LTG:  Patient will maintain dynamic standing balance with assistance during mobility activities (PT) 10/27/2020 1612 by Alger Simons, PT Outcome: Progressing 10/27/2020 1607 by Alger Simons, PT Outcome: Progressing Flowsheets (Taken 10/27/2020 1607) LTG: Pt will maintain dynamic standing balance during mobility activities with:: Supervision/Verbal cueing   Problem: Sit to Stand Goal: LTG:  Patient will perform sit to stand with assistance level (PT) Description: LTG:  Patient will perform sit to stand with assistance level (PT) 10/27/2020 1612 by Alger Simons, PT Outcome: Progressing 10/27/2020 1607 by Alger Simons, PT Flowsheets (Taken 10/27/2020 1607) LTG: PT will perform sit to stand in preparation for functional mobility with assistance level: Supervision/Verbal cueing   Problem: RH Bed Mobility Goal: LTG Patient will perform bed mobility with assist (PT) Description: LTG: Patient will perform bed mobility with assistance, with/without cues (PT). 10/27/2020 1612 by Alger Simons, PT Outcome: Progressing 10/27/2020 1607 by Alger Simons, PT Flowsheets (Taken 10/27/2020 1607) LTG: Pt will perform bed mobility with assistance level of: Supervision/Verbal cueing   Problem: RH Bed to Chair Transfers Goal: LTG Patient will perform bed/chair transfers w/assist (PT) Description: LTG: Patient will perform bed to chair transfers with assistance  (PT). 10/27/2020 1612 by Alger Simons, PT Outcome: Progressing 10/27/2020 1607 by Alger Simons, PT Outcome: Progressing Flowsheets (Taken 10/27/2020 1607) LTG: Pt will perform Bed to Chair Transfers with assistance level: Supervision/Verbal cueing   Problem: RH Car Transfers Goal: LTG Patient will perform car transfers with assist (PT) Description: LTG: Patient will perform car transfers with assistance (PT). 10/27/2020 1612 by Alger Simons, PT Outcome: Progressing 10/27/2020 1607 by Alger Simons, PT Outcome: Progressing Flowsheets (Taken 10/27/2020 1607) LTG: Pt will perform car transfers with assist:: Supervision/Verbal cueing   Problem: RH Ambulation Goal: LTG Patient will ambulate in controlled environment (PT) Description: LTG: Patient will ambulate in a controlled environment, # of feet with assistance (PT). 10/27/2020 1612 by Alger Simons, PT Outcome: Progressing 10/27/2020 1607 by Alger Simons, PT Outcome: Progressing Flowsheets (Taken 10/27/2020 1607) LTG: Pt will ambulate in controlled environ  assist needed:: Independent with assistive device LTG: Ambulation distance in controlled environment: 25 feet using LRAD Goal: LTG Patient will ambulate in home environment (PT) Description: LTG: Patient will ambulate in home environment, # of feet with assistance (PT). 10/27/2020 1612 by Alger Simons, PT Outcome: Progressing 10/27/2020 1607 by Alger Simons, PT Flowsheets (Taken 10/27/2020 1607) LTG: Pt will ambulate in home environ  assist needed:: Supervision/Verbal cueing LTG: Ambulation distance in home environment: at least 75 ft using LRAD   Problem: RH Stairs Goal: LTG Patient will ambulate up and down stairs w/assist (PT) Description: LTG: Patient will ambulate up and down # of stairs with assistance (PT) 10/27/2020 1612 by Alger Simons, PT Outcome: Progressing 10/27/2020 1607 by Alger Simons, PT Flowsheets (Taken 10/27/2020 1607) LTG: Pt will ambulate up/down  stairs assist needed:: Supervision/Verbal cueing LTG: Pt will  ambulate up and down number of stairs: at least one threshold step as per home environment

## 2020-10-27 NOTE — Progress Notes (Signed)
Inpatient [facility] [facility] Individual Statement of Services  Patient Name:  Jonathan [name]  Date:  10/27/2020  Welcome to the [facility].  Our goal is to provide you with an individualized program based on your diagnosis and situation, designed to meet your specific needs.  With this comprehensive rehabilitation program, you will be expected to participate in at least 3 hours of rehabilitation therapies Monday-Friday, with modified therapy programming on the weekends.  Your rehabilitation program will include the following services:  Physical Therapy (PT), Occupational Therapy (OT), Speech Therapy (ST), 24 hour per day rehabilitation nursing, Therapeutic Recreaction (TR), Neuropsychology, Care Coordinator, Rehabilitation Medicine, Nutrition Services, Pharmacy Services and Other  Weekly team conferences will be held on Wednesdays to discuss your progress.  Your Inpatient Rehabilitation Care Coordinator will talk with you frequently to get your input and to update you on team discussions.  Team conferences with you and your family in attendance may also be held.  Expected length of stay: 2 Weeks  Overall anticipated outcome: Supervision  Depending on your progress and recovery, your program may change. Your Inpatient Rehabilitation Care Coordinator will coordinate services and will keep you informed of any changes. Your Inpatient Rehabilitation Care Coordinator's name and contact numbers are listed  below.  The following services may also be recommended but are not provided by the [facility]:    [facility] [facility] will be made to provide these services after discharge if needed.  Arrangements include referral to agencies that provide these services.  Your insurance has been verified to be:  [facility] Medicare Your primary doctor is:  Leonard Downing  Pertinent information will  be shared with your doctor and your insurance company.  Inpatient Rehabilitation Care Coordinator:  Erlene Quan, [place] or [phone]  Information discussed with and copy given to patient by: Dyanne Iha, 10/27/2020, 11:27 AM

## 2020-10-27 NOTE — Progress Notes (Signed)
PROGRESS NOTE   Subjective/Complaints: Swallowing improved , working with SLP C/o loose stools, asked "am I on my gout med" Med list reviewed, colchicine identified   ROS- no CP, SOB, N/V/D   Objective:   No results found. Recent Labs    10/26/20 0540  WBC 11.5*  HGB 10.3*  HCT 32.6*  PLT 414*   Recent Labs    10/26/20 0540  NA 142  K 4.3  CL 98  CO2 30  GLUCOSE 98  BUN 32*  CREATININE 1.06  CALCIUM 8.6*    Intake/Output Summary (Last 24 hours) at 10/27/2020 0820 Last data filed at 10/27/2020 0600 Gross per 24 hour  Intake 240 ml  Output 320 ml  Net -80 ml        Physical Exam: Vital Signs Blood pressure (!) 99/54, pulse 71, temperature 97.8 F (36.6 C), resp. rate 18, height 5\' 8"  (1.727 m), weight 95.7 kg, SpO2 93 %.   General: No acute distress Mood and affect are appropriate Heart: Regular rate and rhythm no rubs murmurs or extra sounds Lungs: Clear to auscultation, breathing unlabored, no rales or wheezes Abdomen: Positive bowel sounds, soft nontender to palpation, nondistended Extremities: No clubbing, cyanosis, or edema Skin: No evidence of breakdown, no evidence of rash 5/5 in BUE and BLE  Cerebellar exam normal finger to nose to finger as well as heel to shin in bilateral upper and lower extremities Musculoskeletal: Full range of motion in all 4 extremities. No joint swelling  Pt swallowed honey thick cold water without cough or change in vocal quality  Assessment/Plan: 1. Functional deficits which require 3+ hours per day of interdisciplinary therapy in a comprehensive inpatient rehab setting.  Physiatrist is providing close team supervision and 24 hour management of active medical problems listed below.  Physiatrist and rehab team continue to assess barriers to discharge/monitor patient progress toward functional and medical goals  Care Tool:  Bathing    Body parts bathed by  patient: Right arm,Left arm,Chest,Abdomen,Front perineal area,Buttocks,Right upper leg,Left upper leg,Face   Body parts bathed by helper: Buttocks,Right lower leg,Left lower leg     Bathing assist Assist Level: Moderate Assistance - Patient 50 - 74%     Upper Body Dressing/Undressing Upper body dressing   What is the patient wearing?: Button up shirt    Upper body assist Assist Level: Moderate Assistance - Patient 50 - 74%    Lower Body Dressing/Undressing Lower body dressing      What is the patient wearing?: Pants,Incontinence brief     Lower body assist Assist for lower body dressing: Maximal Assistance - Patient 25 - 49%     Toileting Toileting    Toileting assist Assist for toileting: Minimal Assistance - Patient > 75%     Transfers Chair/bed transfer  Transfers assist     Chair/bed transfer assist level: Contact Guard/Touching assist     Locomotion Ambulation   Ambulation assist      Assist level: Contact Guard/Touching assist Assistive device: Walker-rolling     Walk 10 feet activity   Assist     Assist level: Contact Guard/Touching assist Assistive device: Walker-rolling   Walk 50 feet activity  Assist    Assist level: Contact Guard/Touching assist Assistive device: Walker-rolling    Walk 150 feet activity   Assist    Assist level: Contact Guard/Touching assist Assistive device: Walker-rolling    Walk 10 feet on uneven surface  activity   Assist Walk 10 feet on uneven surfaces activity did not occur: Safety/medical concerns         Wheelchair     Assist Will patient use wheelchair at discharge?: No             Wheelchair 50 feet with 2 turns activity    Assist            Wheelchair 150 feet activity     Assist          Blood pressure (!) 99/54, pulse 71, temperature 97.8 F (36.6 C), resp. rate 18, height 5\' 8"  (1.727 m), weight 95.7 kg, SpO2 93 %.  Medical Problem List and  Plan: 1.Functional and mobility deficitssecondary to left cerebellar infarcts after CABG -patient may shower -ELOS/Goals: 12-14 days, supervision to mod I with PT, OT, SLP 2. Antithrombotics: -DVT/anticoagulation:Pharmaceutical:Lovenox--added -antiplatelet therapy: Plavix/ASA 3. Pain Management:Oxycodone prn severe and tramadol prn moderate pain 4. Mood:LCSW to follow for evaluation and support. -antipsychotic agents: N/A 5. Neuropsych: This patientiscapable of making decisions on hisown behalf. 6. Skin/Wound Care:routine pressure relief measures.  --Monitor incisions for healing  7. Fluids/Electrolytes/Nutrition:Monitor I/O. Check lytes on Monday 8. CAD s/p CABG: Continue sternal precautions --monitor for symptoms with increased activity --On Lasix 40mg IV bid--> change to po based on weight/diuresis. Pt still a few pounds over pre-op weight --lopressorincreased to 25mg bid 2/10to help manage tachycardia -will add ACE wraps to LE to assist with LE edema. 9. HTN: Monitor BP tid-- Lasix,lopressor Vitals:   10/26/20 1935 10/27/20 0335  BP: (!) 115/91 (!) 99/54  Pulse: 95 71  Resp: (!) 21 18  Temp: 98.7 F (37.1 C) 97.8 F (36.6 C)  SpO2: 91% 93%  A little lower today but asymptomatic  10. H/o DOE/Hypoxia: Encourage pulmonary hygiene --wean oxygen as able.  11. Pre-renal azotemia: Encourage fluids but monitor for signs of overaload. 12. Dysphagia: Continue DII, honey liquids --pt has a deviated septum, tube very uncomfortable -he's eating well (100% breakfast)---> dc NGT -push honeys. No IVF at this point given above Pt appears anxious about swallowing some throat irritation will rx chlorasept and ask SLP to see today , ok for po and meds in applesauce  13. ABLA: Recheck CBC 02/14 14. Stress induced  hyperglycemia: Hgb A1c-5.6. Monitor BS ac/hs and use SSI for control to help promote wound healing. 15. COPD: Used Symbicort prn at home--resume and schedule.  16. Diarrhea pt on colchicine, has had this side effect in past will hold and monitor  LOS: 2 days A FACE TO FACE EVALUATION WAS PERFORMED  Charlett Blake 10/27/2020, 8:20 AM

## 2020-10-28 DIAGNOSIS — I639 Cerebral infarction, unspecified: Secondary | ICD-10-CM | POA: Diagnosis not present

## 2020-10-28 LAB — GLUCOSE, CAPILLARY
Glucose-Capillary: 92 mg/dL (ref 70–99)
Glucose-Capillary: 86 mg/dL (ref 70–99)
Glucose-Capillary: 101 mg/dL — ABNORMAL HIGH (ref 70–99)
Glucose-Capillary: 92 mg/dL (ref 70–99)

## 2020-10-28 MED ORDER — SALINE SPRAY 0.65 % NA SOLN
1.0000 | Freq: Two times a day (BID) | NASAL | Status: DC
  Administered 2020-10-28 – 2020-11-04 (×13): 1 via NASAL
  Filled 2020-10-28: qty 44

## 2020-10-28 MED ORDER — TEMAZEPAM 7.5 MG PO CAPS: 7.5000 mg | ORAL_CAPSULE | Freq: Every evening | ORAL | Status: DC | PRN

## 2020-10-28 NOTE — Progress Notes (Signed)
Speech Language Pathology Daily Session Note  Patient Details  Name: Jonathan [name] MRN: [id number] Date of Birth: [birth year]  Today's Date: 10/28/2020 SLP Individual Time: 3532-9924 SLP Individual Time Calculation (min): 28 min  Short Term Goals: Week 1: SLP Short Term Goal 1 (Week 1): Pt will consume Dys 3 textures and honey thick liquids with minimal overt s/sx aspiration and efficient mastication and oral clearance with Supervision A verbal cues for use of swallowing strategies. SLP Short Term Goal 2 (Week 1): Pt will consume therapeutic trials of thin H2O with minimal overt s/sx aspiration and no appreciable change in vitals prior to repeat MBSS. SLP Short Term Goal 3 (Week 1): Pt will perform pharyngeal strengthening exercises with Supervision A verbal cues.  Skilled Therapeutic Interventions: Pt was seen for skilled ST targeting dysphagia goals. SLP facilitated session with advanced trial of thin H2O, which pt consumed (self fed cup sips) without any overt s/sx aspiration. He consumed a total of ~6 oz. Recommend pt proceed with repeat MBSS tomorrow 10/29/20 at 0900 to reassess pharyngeal swallow function and potential for liquid advancement. Pt also observed to take medications form RN - although from an oral swallow function standpoint pt is appropriate to consume pills whole in purees, pt reports due to his sore/dry throat he prefers to consume them crushed in applesauce. He also performed effortful swallowing exercise for pharyngeal strengthening X10 Mod I. Pt left sitting edge of bed with alarm set and needs within reach. Continue per current plan of care.        Pain Pain Assessment Pain Scale: 0-10 Pain Score: 0-No pain  Therapy/Group: Individual Therapy  Arbutus Leas 10/28/2020, 7:20 AM

## 2020-10-28 NOTE — Progress Notes (Signed)
Occupational Therapy Session Note  Patient Details  Name: Jonathan [name] MRN: [id number] Date of Birth: 04/12/1951  Today's Date: 10/28/2020 OT Individual Time: 0600-4599 OT Individual Time Calculation (min): 32 min and 64 min   Short Term Goals: Week 1:  OT Short Term Goal 1 (Week 1): STG = LTG due to estimated LOS  Skilled Therapeutic Interventions/Progress Updates:    Session 1(1134-1206): Pt received semi-reclined in bed with wife and RN present, agreeable to therapy. Denies pain. Session focus on activity tolerance, func mobility, and discussion of POC and personal therapy goals. Supine > sitting EOB with CGA for balance, good initiation of following sternal precautions. STS and amb throughout session with RW  With close S for total of ~20 ft. Transport to gym in w/c 2/2 time/energy conservation. Completed the following bouts on Kinetron: 1) 4 min at 60 resistance, reporting 7/10 on mod RPE 2) 1 min x 3 at 20 resistance, reporting 7/10 on mod RPE after each bout  Transported back to room and amb transfer to bed. Sitting EOB > supine with close and good use of sternal precautions. Pt reports not being worried about going home as he sleeps in his recliner primarily.  Pt left semi-reclined in bed with wife present and bed alarm engaged, call bell in reach, and all immediate needs met.     Session 2 (1302-1406): Pt received sitting EOB eating lunch with wife present agreeable to therapy. Denies pain. Session focus on AE, activity tolerance, and dynamic standing balance in prep for improved independence with BADL/ func transfers. Provided toilet tongs to increase ind with posterior pericare 2/2 sternal precautions, pt states he will probably need them at home and will trial them while inpatient. Pt additionally states he has a sock aide and reacher he uses at baseline. STS and amb with RW throughout session with close S. Stood to wash face at sink with close S. W/c transport to gym 2/2 time  management/energy conservation. Completed various activity and circuit training to target activity tolerance + dynamic standing balance including horse shoes on airex mat (with RW + min VCs to abide by sternal precautions) and 2x10 incline STS. Pt reports mod RPE at 5-7/10. Amb ~ 20 ft back to room with above assist level. Sitting EOB <> supine with min A to manage trunk/BLE. Applied ACE wrap to BLE to assist with BLE edema. Left seated EOB with wife present, bed alarm engaged, call bell in reach, and all immediate needs met.     Therapy Documentation Precautions:  Precautions Precautions: Fall,Sternal Restrictions Weight Bearing Restrictions: No RUE Weight Bearing: Weight bearing as tolerated LLE Weight Bearing: Weight bearing as tolerated Pain: Pain Assessment Pain Scale: 0-10 Pain Score: 0-No pain   ADL: See Care Tool for more details.  Therapy/Group: Individual Therapy  Volanda Napoleon MS, OTR/L  10/28/2020, 12:19 PM

## 2020-10-28 NOTE — Progress Notes (Signed)
Called to patient room after complaining that he was having difficult breathing , upon entering room patient had position himself in low fowler position and was repositioned in [employer]. Placed on nasal oxygen 2, O2 sat. 87% verbalized discomfort of chest soreness after episodes of coughing. O2 sats  at 99% with nasal oxygen , VSS. Patient is anxious and support provided, Yaunker orally used by patient and encouraged, thick tan secretion noted with cough. Continue to monitor and assist. Helyn Numbers., PA made aware    .

## 2020-10-28 NOTE — Progress Notes (Signed)
Physical Therapy Session Note  Patient Details  Name: Jonathan [name] MRN: [id number] Date of Birth: [birth year]  Today's Date: 10/28/2020 PT Individual Time: 1635-1700 PT Individual Time Calculation (min): 25 min   Short Term Goals: Week 1:  PT Short Term Goal 1 (Week 1): STG = LTG d/t ELOS PT Short Term Goal 1 - Progress (Week 1): Progressing toward goal  Skilled Therapeutic Interventions/Progress Updates:   Pt received supine in bed, asleep. Pt then began coughing, producing copious amounts of sputum, requiring multiple bouts of suctioning with yaunker. Pt noted to have mild wheeze following coughing fit. PT suggested to transfer to recliner to improve trunk position and reduce sputum production. Supine: sit EOB with mod assist at trunk to allow pt to maintain sternal precautions. Stand pivot transfer to recliner with min assist for balance and safety.   PT assessed VS, HR 78. SpO2 98%. Pt left resting in Recliner with call bell in reach and all needs met.        Therapy Documentation Precautions:  Precautions Precautions: Fall,Sternal Restrictions Weight Bearing Restrictions: No RUE Weight Bearing: Weight bearing as tolerated LLE Weight Bearing: Weight bearing as tolerated Pain: Pain Assessment Pain Scale: 0-10 Pain Score: 0-No pain    Therapy/Group: Individual Therapy  Lorie Phenix 10/28/2020, 5:14 PM

## 2020-10-28 NOTE — Progress Notes (Signed)
Physical Therapy Session Note  Patient Details  Name: Jonathan [name] MRN: [id number] Date of Birth: 1951-09-08  Today's Date: 10/28/2020 PT Individual Time: 0801-0900 PT Individual Time Calculation (min): 59 min   Short Term Goals: Week 1:  PT Short Term Goal 1 (Week 1): STG = LTG d/t ELOS PT Short Term Goal 1 - Progress (Week 1): Progressing toward goal  Skilled Therapeutic Interventions/Progress Updates:    Patient seated on EOB completing breakfast upon PT arrival. Patient alert and agreeable to PT session. Patient denied pain throughout session.  Pt indicates that wife has provided measurements of the toilet heights in the house and has questions re: differences in heights. Front bathroom noted to be 18" and "back" bathroom at 15". Explained that 15" is considered standard height toilet while 18" is considered to be elevated or used to be called a "handicapped" height toilet. Pt states he prefers "his" toilet in the back of the house as he has the vanity/ sink and other supports available to assist with getting on/ off toilet. Pt reminded that on sternal precautions, he may not be able to pull as much as he is used to in order to get off the lower height toilet. Indicated to pt that he is sitting on a bed that is 19" from the floor and the toilet in the bathroom here is 18" tall, and he has not had an issue with either at this height. With an addition of 3-in-1 over top of toilet during recovery, he can continue to use "his" toilet.   Therapeutic Activity: Transfers: Patient performed STS and SPVT transfers throughout session with supervision assist.  Provided verbal cues for hands to thighs on first performance and pt then able to return perform correctly throughout rest of session. 5x STS performed in 25.94 seconds with no AD.  Between gait bouts, pt is able to perform 4 x5 minisquats with good form and no AD. Pt requires seated rest break following performance of each 5 reps. Is able to  flex deep enough at hips and knees to reach 5" from w/c seat.   Gait Training:  With focus on activity tolerance training, pt ambulated >200' x1/ >300' x1 using RW and supervision/ intermittent CGA. Ambulated with slow pace and increased lateral sway d/t Bil LE swelling. Provided verbal cues for maintaining slow pace and managing energy conservation and breathing with several standing rest breaks throughout. SpO2 maintained throughout between 89-92% during activity and up to 95% with seated rest. Several bouts with coughing, that pt manages well with hand pressure to incision at chest and several deep breaths. Pt guided in different aspects of dynamic gait with no changes noted in path or quality of gait.   Patient seated upright on EOB with breakfast tray in front of him at end of session. Bed brakes locked, bed alarm set, and all needs within reach.    Therapy Documentation Precautions:  Precautions Precautions: Fall,Sternal Restrictions Weight Bearing Restrictions: No RUE Weight Bearing: Weight bearing as tolerated LLE Weight Bearing: Weight bearing as tolerated    Therapy/Group: Individual Therapy  Alger Simons 10/28/2020, 10:54 AM

## 2020-10-28 NOTE — Patient Care Conference (Signed)
Inpatient RehabilitationTeam Conference and Plan of Care Update Date: 10/28/2020   Time: 10:20 AM    Patient Name: Jonathan [name]      Medical Record Number: [id number]  Date of Birth: 14-Jul-1951 Sex: Male         Room/Bed: 4W01C/4W01C-01 Payor Info: Payor: [employer] MEDICARE / Plan: [facility] MEDICARE / Product Type: *No Product type* /    Admit Date/Time:  10/25/2020  2:15 PM  Primary Diagnosis:  Cerebellar stroke [facility])  Hospital Problems: Principal Problem:   Cerebellar stroke [facility])    Expected Discharge Date: Expected Discharge Date: 11/04/20  Team Members Present: Physician leading conference: Dr. Alysia Penna Care Coodinator Present: Dorien Chihuahua, RN, BSN, CRRN;Christina Sampson Goon, [place] Nurse Present: Glenis Smoker, RN PT Present: Barrie Folk, PT OT Present: Other (comment) Edmonia Lynch, OT) SLP Present: Jettie Booze, CF-SLP PPS Coordinator present : Gunnar Fusi, SLP     Current Status/Progress Goal Weekly Team Focus  Bowel/Bladder   Patient is continent of bladder and incont. at times with bowel, LBM 10/27/2020  Maintain continence  Assess toileting needs QS/PRN   Swallow/Nutrition/ Hydration   Dys 3, thin, Supervision  Mod I  trials thin, tolerance D3 upgrade, repeat MBSS   ADL's   close S to CGA for toilet/shower transfer, LBD; min A upper body adl due to sternal precautions  supervision to mod I  selfcare retraining, balance retraining, activity tolerance, pt/fam edu, DME/AE edu, functional transfer retraining   Mobility             Communication             Safety/Cognition/ Behavioral Observations            Pain   no c/o PAIN, HAS DISCOMFOT CHEST SORENESS AT TIMES  < 3  Assess QS/PRN   Skin   s/p CABG, chest incsion intact no drainage  Skin remain intact and free of infection  Assess  surgical chest incsion site QS/PRN,     Discharge Planning:  D/c home with spouse (Supervision) 1 level home   Team Discussion: Swallowing issues  improved.Currently on Dysphagia 3 honey thick diet; medications crushed in applesauce. Anxiety noted with wheezing. MD added inhaler and oxygen prn. Colchicine discontinued for gout due to diarrhea, monitoring for symptoms. Patient on target to meet rehab goals: yes, currently supervision - CGA for shower transfers, amin assist for upper body ADLs maintinaing sternal precautions. CGA - min assist for transfers with a rolling walker and supervision - mod I goals set for discharge.  *See Care Plan and progress notes for long and short-term goals.   Revisions to Treatment Plan:  Anticipate MBS 10/29/20 to advance consistency of liquids; thin trials at bedside going well. Teaching Needs: Transfers, toileting, medications,   Current Barriers to Discharge: Decreased caregiver support  Possible Resolutions to Barriers: Family education    Medical Summary Current Status: one level home, occ nasal congestion, insomnia,  Barriers to Discharge: Medical stability   Possible Resolutions to [employer] Focus: notify nsg of bladder needs,adjust sleep meds, trail nasal spray, avoid anticholinergic meds   Continued Need for Acute Rehabilitation Level of Care: The patient requires daily medical management by a physician with specialized training in physical medicine and rehabilitation for the following reasons: Direction of a multidisciplinary physical rehabilitation program to maximize functional independence : Yes Medical management of patient stability for increased activity during participation in an intensive rehabilitation regime.: Yes Analysis of laboratory values and/or radiology reports with any subsequent need for medication  adjustment and/or medical intervention. : Yes   I attest that I was present, lead the team conference, and concur with the assessment and plan of the team.   Dorien Chihuahua B 10/28/2020, 3:06 PM

## 2020-10-28 NOTE — Progress Notes (Signed)
Nutrition Follow-up  RD working remotely.  DOCUMENTATION CODES:   Obesity unspecified  INTERVENTION:   - Ensure Enlive po BID, each supplement provides 350 kcal and 20 grams of protein  - Magic cup TID with meals, each supplement provides 290 kcal and 9 grams of protein  NUTRITION DIAGNOSIS:   Increased nutrient needs related to post-op healing,other (therapies) as evidenced by estimated needs.  Ongoing  GOAL:   Patient will meet greater than or equal to 90% of their needs  Progressing  MONITOR:   PO intake,Supplement acceptance,Diet advancement,Labs,Weight trends  REASON FOR ASSESSMENT:   Consult Assessment of nutrition requirement/status  ASSESSMENT:   70 year old male with PMH of HTN, COPD, CAD. Pt was admitted on 10/14/20 for cath revealing severe CAD with in-stent restenosis. Pt underwent CABG x 4 on 2/3. Pt did have decline in mental status with difficulty swallowing on 2/7 and CT of head done revealing age-indeterminate left posterior cerebellar acute/subacute infarct. Pt made NPO with placement of Cortrak for nutritional support. MBS performed on 2/9 and pt was started on a dysphagia 2 diet with honey-thick liquids. Cortrak tube removed. Admitted to CIR on 2/13.  2/17 - MBS, diet advanced to dysphagia 3 with thin liquids  Unable to reach pt via phone call to room. Pt had MBS this morning and advanced to thin liquids. RD will order thin liquid oral nutrition supplements to aid pt in meeting kcal and protein needs.  CIR admit weight: 98.9 kg Current weight: 95.2 kg  If admit weight is accurate, pt with a 3.7 kg weight loss since admission to CIR. This is a 3.7% weight loss in less than 1 week which is significant for timeframe. Will continue to monitor trends.  Per RN edema assessment, pt continues to have moderate pitting edema to BLE.  Meal Completion: 50-100%  Medications reviewed and include: dulcolax, lasix, SSI, klor-con 20 mEq BID  Labs reviewed: BUN  32 CBG's: 81-101 x 24 hours  UOP: 1200 ml x 24 hours  Diet Order:   Diet Order            DIET DYS 3 Room service appropriate? Yes; Fluid consistency: Thin  Diet effective now                 EDUCATION NEEDS:   No education needs have been identified at this time  Skin:  Skin Assessment: Skin Integrity Issues: Incisions: chest, right leg  Last BM:  10/28/20  Height:   Ht Readings from Last 1 Encounters:  10/26/20 5\' 8"  (1.727 m)    Weight:   Wt Readings from Last 1 Encounters:  10/29/20 95.2 kg    BMI:  Body mass index is 31.91 kg/m.  Estimated Nutritional Needs:   Kcal:  2300-2500  Protein:  115-130 grams  Fluid:  2.0 L/day    Gustavus Bryant, MS, RD, LDN Inpatient Clinical Dietitian Please see AMiON for contact information.

## 2020-10-28 NOTE — Progress Notes (Signed)
PROGRESS NOTE   Subjective/Complaints:  Very dry mouth, "nose was plugged last noc"   ROS- no CP, SOB, N/V/D   Objective:   No results found. Recent Labs    10/26/20 0540  WBC 11.5*  HGB 10.3*  HCT 32.6*  PLT 414*   Recent Labs    10/26/20 0540  NA 142  K 4.3  CL 98  CO2 30  GLUCOSE 98  BUN 32*  CREATININE 1.06  CALCIUM 8.6*    Intake/Output Summary (Last 24 hours) at 10/28/2020 0858 Last data filed at 10/28/2020 4888 Gross per 24 hour  Intake 480 ml  Output 750 ml  Net -270 ml        Physical Exam: Vital Signs Blood pressure 121/62, pulse 69, temperature 98.6 F (37 C), temperature source Oral, resp. rate 20, height _0  (1.727 m), weight 95 kg, SpO2 99 %.    General: No acute distress Mood and affect are appropriate Heart: Regular rate and rhythm no rubs murmurs or extra sounds Lungs: Clear to auscultation, breathing unlabored, no rales or  Left sided wheezes Abdomen: Positive bowel sounds, soft nontender to palpation, nondistended Extremities: No clubbing, cyanosis, or edema Skin: No evidence of breakdown, no evidence of rash Neurologic: Cranial nerves II through XII intact, motor strength is 5/5 in bilateral deltoid, bicep, tricep, grip, hip flexor, knee extensors, ankle dorsiflexor and plantar flexor   Musculoskeletal:  No joint swelling Assessment/Plan: 1. Functional deficits which require 3+ hours per day of interdisciplinary therapy in a comprehensive inpatient rehab setting.  Physiatrist is providing close team supervision and 24 hour management of active medical problems listed below.  Physiatrist and rehab team continue to assess barriers to discharge/monitor patient progress toward functional and medical goals  Care Tool:  Bathing    Body parts bathed by patient: Right arm,Left arm,Chest,Abdomen,Front perineal area,Buttocks,Right upper leg,Left upper leg,Face   Body parts  bathed by helper: Buttocks,Right lower leg,Left lower leg     Bathing assist Assist Level: Moderate Assistance - Patient 50 - 74%     Upper Body Dressing/Undressing Upper body dressing   What is the patient wearing?: Button up shirt    Upper body assist Assist Level: Moderate Assistance - Patient 50 - 74%    Lower Body Dressing/Undressing Lower body dressing      What is the patient wearing?: Incontinence brief,Pants     Lower body assist Assist for lower body dressing: Supervision/Verbal cueing     Toileting Toileting    Toileting assist Assist for toileting: Dependent - Patient 0%     Transfers Chair/bed transfer  Transfers assist     Chair/bed transfer assist level: Supervision/Verbal cueing     Locomotion Ambulation   Ambulation assist      Assist level: Contact Guard/Touching assist Assistive device: Walker-rolling Max distance: 185'   Walk 10 feet activity   Assist     Assist level: Contact Guard/Touching assist Assistive device: Walker-rolling   Walk 50 feet activity   Assist    Assist level: Contact Guard/Touching assist Assistive device: Walker-rolling    Walk 150 feet activity   Assist    Assist level: Contact Guard/Touching assist Assistive device: Walker-rolling  Walk 10 feet on uneven surface  activity   Assist Walk 10 feet on uneven surfaces activity did not occur: Safety/medical concerns         Wheelchair     Assist Will patient use wheelchair at discharge?: No             Wheelchair 50 feet with 2 turns activity    Assist            Wheelchair 150 feet activity     Assist          Blood pressure 121/62, pulse 69, temperature 98.6 F (37 C), temperature source Oral, resp. rate 20, height _0  (1.727 m), weight 95 kg, SpO2 99 %.  Medical Problem List and Plan: 1.Functional and mobility deficitssecondary to left cerebellar infarcts after CABG -patient may  shower -ELOS/Goals: 12-14 days, supervision to mod I with PT, OT, SLP Team conference today please see physician documentation under team conference tab, met with team  to discuss problems,progress, and goals. Formulized individual treatment plan based on medical history, underlying problem and comorbidities.  2. Antithrombotics: -DVT/anticoagulation:Pharmaceutical:Lovenox--added -antiplatelet therapy: Plavix/ASA 3. Pain Management:Oxycodone prn severe and tramadol prn moderate pain 4. Mood:LCSW to follow for evaluation and support. -antipsychotic agents: N/A 5. Neuropsych: This patientiscapable of making decisions on hisown behalf. 6. Skin/Wound Care:routine pressure relief measures.  --Monitor incisions for healing  7. Fluids/Electrolytes/Nutrition:Monitor I/O. Check lytes on Monday 8. CAD s/p CABG: Continue sternal precautions --monitor for symptoms with increased activity --On Lasix 12mIV bid--> change to po based on weight/diuresis. Pt still a few pounds over pre-op weight --lopressorincreased to 28mid 2/10to help manage tachycardia -will add ACE wraps to LE to assist with LE edema. 9. HTN: Monitor BP tid-- Lasix,lopressor Vitals:   10/28/20 0340 10/28/20 0600  BP: (!) 102/52 121/62  Pulse: 76 69  Resp: 18 20  Temp: 98.6 F (37 C)   SpO2: 91% 99%  A little lower today but asymptomatic  10. H/o DOE/Hypoxia: Encourage pulmonary hygiene --wean oxygen as able.  11. Pre-renal azotemia: Encourage fluids but monitor for signs of overaload. 12. Dysphagia: Continue DII, honey liquids --pt has a deviated septum, tube very uncomfortable -he's eating well (100% breakfast)---> dc NGT -push honeys. No IVF at this point given above Pt appears anxious about swallowing some throat irritation will rx chlorasept and ask SLP to see  today , ok for po and meds in applesauce  13. ABLA: Recheck CBC 02/14 14. Stress induced hyperglycemia: Hgb A1c-5.6. Monitor BS ac/hs and use SSI for control to help promote wound healing. 15. COPD: Used Symbicort prn at home--now on Breo Elliptica  16. Diarrhea pt on colchicine, has had this side effect in past will hold and monitor 17.  Xerostomia- likely multifactorial- saline nasal spray, d/c trazodone, may use restoril  Since no anticholinergic effect, humidified O2 LOS: 3 days A FACE TO FACE EVALUATION WAS PERFORMED  AnCharlett Blake/16/2022, 8:58 AM

## 2020-10-28 NOTE — Progress Notes (Signed)
Physical Therapy Session Note  Patient Details  Name: Jonathan [name] MRN: [id number] Date of Birth: 1951/08/05  Today's Date: 10/28/2020 PT Individual Time: 1003-1030 PT Individual Time Calculation (min): 27 min   Short Term Goals: Week 1:  PT Short Term Goal 1 (Week 1): STG = LTG d/t ELOS PT Short Term Goal 1 - Progress (Week 1): Progressing toward goal  Skilled Therapeutic Interventions/Progress Updates:    Patient received sitting edge of bed, wife present, agreeable to PT. He denies pain. Patient able to ambulate 195ft with RW and light CGA. 1 standing rest break due to fatigue/SOB. NuStep x5 mins, B LE only, with multiple rest breaks due to fatigue. Patient ambulating back to his room with no rest breaks, RW and CGA. Patient remaining sitting up on edge of bed, bed alarm on, wife at bedside.   Therapy Documentation Precautions:  Precautions Precautions: Fall,Sternal Restrictions Weight Bearing Restrictions: No RUE Weight Bearing: Weight bearing as tolerated LLE Weight Bearing: Weight bearing as tolerated    Therapy/Group: Individual Therapy  Karoline Caldwell, PT, DPT, CBIS  10/28/2020, 7:43 AM

## 2020-10-29 ENCOUNTER — Inpatient Hospital Stay (HOSPITAL_COMMUNITY): Payer: Medicare Other

## 2020-10-29 LAB — GLUCOSE, CAPILLARY
Glucose-Capillary: 81 mg/dL (ref 70–99)
Glucose-Capillary: 88 mg/dL (ref 70–99)
Glucose-Capillary: 71 mg/dL (ref 70–99)
Glucose-Capillary: 97 mg/dL (ref 70–99)

## 2020-10-29 MED ORDER — ENSURE ENLIVE PO LIQD
237.0000 mL | Freq: Two times a day (BID) | ORAL | Status: DC
  Administered 2020-10-29 – 2020-11-04 (×10): 237 mL via ORAL

## 2020-10-29 NOTE — IPOC Note (Signed)
Overall Plan of Care [facility]) Patient Details Name: Jonathan [name] MRN: [id number] DOB: [birth year]  Admitting Diagnosis: Cerebellar stroke [facility])  Hospital Problems: Principal Problem:   Cerebellar stroke ([place])     Functional Problem List: Nursing Pain,Safety,Skin Integrity,Nutrition,Motor,Medication Management  PT Balance,Edema,Endurance,Motor,Nutrition,Pain,Safety  OT Balance,Edema,Endurance  SLP Nutrition  TR         Basic ADL's: OT Grooming,Bathing,Dressing,Toileting     Advanced  ADL's: OT       Transfers: PT Bed Mobility,Bed to Chair,Car,Furniture  OT Toilet,Tub/Shower     Locomotion: PT Ambulation,Stairs     Additional Impairments: OT    SLP Swallowing      TR      Anticipated Outcomes Item Anticipated Outcome  Self Feeding independent  Swallowing  Mod I   Basic self-care  set up/supervision  Toileting  supervision   Bathroom Transfers supervision  Bowel/Bladder  Remain continent of bowel/bladder while in rehab  Transfers  Mod I/ supervision  Locomotion  supervision with LRAD  Communication     Cognition     Pain  <2  Safety/Judgment  Able to call for help and follow safety plan   Therapy Plan: PT Intensity: Minimum of 1-2 x/day ,45 to 90 minutes PT Frequency: 5 out of 7 days PT Duration Estimated Length of Stay: 7-10 days OT Intensity: Minimum of 1-2 x/day, 45 to 90 minutes OT Frequency: 5 out of 7 days OT Duration/Estimated Length of Stay: 1 week SLP Intensity: Minumum of 1-2 x/day, 30 to 90 minutes SLP Frequency: 3 to 5 out of 7 days SLP Duration/Estimated Length of Stay: 2 weeks   Due to the current state of emergency, patients may not be receiving their 3-hours of Medicare-mandated therapy.   Team Interventions: Nursing Interventions Patient/Family Education,Disease Management/Prevention,Skin Care/Wound Management,Discharge Planning,Pain Risk analyst  PT interventions  Ambulation/gait training,Balance/vestibular training,Community Armed forces training and education officer instruction,Functional mobility training,Neuromuscular re-education,Pain management,Patient/family education,Psychosocial support,Skin care/wound management,Stair training,Therapeutic Activities,Therapeutic Exercise,UE/LE Strength taining/ROM,UE/LE Coordination activities  OT Interventions Self Care/advanced ADL retraining,Therapeutic Electronics engineer instruction,Patient/family education,Discharge planning,Functional mobility training,Therapeutic Activities  SLP Interventions Cueing hierarchy,Dysphagia/aspiration precaution training,Patient/family education  TR Interventions    SW/CM Interventions Discharge Planning,Psychosocial Support,Patient/Family Education,Disease Management/Prevention   Barriers to Discharge MD  Medical stability, Weight and Weight bearing restrictions  Nursing      PT Decreased caregiver support,Wound Care,Lack of/limited family support,Insurance for SNF coverage,Weight bearing restrictions,Medication compliance,Nutrition means    OT      SLP      SW       Team Discharge Planning: Destination: PT-Home ,OT- Home , SLP-Home Projected Follow-up: PT-Home health PT, OT-  Home health OT, SLP-Other (comment) (TBD depending on ST progress) Projected Equipment Needs: PT-To be determined, OT- 3 in 1 bedside comode, SLP-None recommended by SLP Equipment Details: PT- , OT-wife to measure built in shower seat - may need transfer bench Patient/family involved in discharge planning: PT- Patient,  OT-Patient,Family member/caregiver, SLP-Patient,Family member/caregiver  MD ELOS: 14d Medical Rehab Prognosis:  Good Assessment:   70 year old male with history of HTN, COPD, CAD--had refused CABG last year but continues to decline with progressive weakness, DOE and BLE pain. He reported that he could not walk household  distances without shortness of breath or chest pain. He was admitted on 10/14/2020 for cath revealing severe CAD with in-stent restenosis and underwent CABG x4 by Dr. Julien Girt on 10/15/2020. Postop had issues with volume overload requiring IV diuresis as well as confusion with productive cough and hypoxia. He did have decline in  mental status with difficulty swallowing 02/07 and CT of head done revealing age-indeterminate left posterior cerebellar acute/subacute infarct. Bedside swallow showed increased WOB with p.o. trials and odynophagia and he was made n.p.o. with placement of feeding tube from nutritional support.   Neurology consulted for input and recommended MRI/MRI brain for work-up. This revealed small volume early subacute ischemic infarcts involving left cerebellar hemisphere, question focal severe proximal right MCA stenosis versus artifact and mild left ICA stenosis. 2D echo done showing EF 55 to 60% with evidence of protruding immobile plaque in descending aorta. Dr. Erlinda Hong felt the stroke was embolic versus cardio procedure related and recommended DAPT as well as 30-day cardiac monitor on outpatient basis to rule out arrhythmia as cause of stroke. 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 Emotional support has been provided by staff and at this time he is agreeable to current interventions. To alcohol as well as withdrawal from was made DNR multiple times. As respiratory status improved, MBS performed on 02/09 and patient was started on dysphagia 2 diet with honey liquids.Therapy has been ongoing and patient noted to be limited by weakness, tachycardia, DOE and sternal precautions. CIR recommended due to functional decline.   Now requiring 24/7 Rehab RN,MD, as well as CIR level  PT, OT and SLP.  Treatment team will focus on ADLs and mobility with goals set at Mod I /SUp level   See Team Conference Notes for weekly updates to the plan of care

## 2020-10-29 NOTE — Progress Notes (Signed)
Patient ID: Jonathan [name], male   DOB: 13-Jan-1951, 70 y.o.   MRN: [id number] Follow up with the patient regarding HTN management/monitoring. MD recommended checking BP when at the physicians' office; no need for a portable machine unless they want to pick one up from the pharmacy. Patient feeling better and less anxious about discharge. No questions noted regarding education provided previously. Continue to follow along to discharge to address educational needs. Margarito Liner

## 2020-10-29 NOTE — Progress Notes (Signed)
Modified Barium Swallow Progress Note  Patient Details  Name: Jonathan [name] MRN: [id number] Date of Birth: [birth year]  Today's Date: 10/29/2020  Modified Barium Swallow completed.  Full report located under Chart Review in the Imaging Section.  Brief recommendations include the following:  Clinical Impression   Pt presents with very mild oral and pharyngeal phase swallowing deficits, but excellent functional improvement in ability to protect his airway with thin liquids today. Although pt exhibits consistently reduced anterior hyolaryngeal excursion during the swallow, only one instance of flash penetration of thin barium observed on the first trial that was presented -- material began to enter the laryngeal vestibule prior to the swallow due to delayed epiglottic inversion, but remained above the vocal folds and 100% of penetrate was ejected from vestibule during the swallow. No aspiration was observed regardless of bolus size or method of delivery (cup VS straw). Pt did demonstrate difficulty coordinating dual consistency barium pill with thin liquid due to reduced posterior propulsion and lingual pumping of pill and as a result, decreased bolus cohesion. SLP would recommend pt continue dysphagia 3 solid textures, upgrade to thin liquids, continue medications crushed or whole in applesauce (depending on pt's preference). Pt should have intermittent supervision during meals to ensure use of safe swallow strategies, particularly slow rate, given pt's decreased respiratory status. ST will continue to follow pt to ensure diet safety and efficiency while inpatient.    Swallow Evaluation Recommendations       SLP Diet Recommendations: Dysphagia 3 (Mech soft) solids;Thin liquid   Liquid Administration via: Cup;Straw   Medication Administration: Whole meds with puree   Supervision: Patient able to self feed;Intermittent supervision to cue for compensatory strategies   Compensations: Slow  rate;Small sips/bites   Postural Changes: Remain semi-upright after after feeds/meals (Comment);Seated upright at 90 degrees   Oral Care Recommendations: Oral care BID        Arbutus Leas 10/29/2020,9:45 AM

## 2020-10-29 NOTE — Progress Notes (Incomplete)
PROGRESS NOTE   Subjective/Complaints:    ROS- no CP, SOB, N/V/D   Objective:   No results found. No results for input(s): WBC, HGB, HCT, PLT in the last 72 hours. No results for input(s): NA, K, CL, CO2, GLUCOSE, BUN, CREATININE, CALCIUM in the last 72 hours.  Intake/Output Summary (Last 24 hours) at 10/29/2020 0830 Last data filed at 10/28/2020 2146 Gross per 24 hour  Intake 250 ml  Output 900 ml  Net -650 ml        Physical Exam: Vital Signs Blood pressure 110/64, pulse 70, temperature 98.1 F (36.7 C), resp. rate 16, height 5\' 8"  (1.727 m), weight 95.2 kg, SpO2 92 %.     General: No acute distress Mood and affect are appropriate Heart: Regular rate and rhythm no rubs murmurs or extra sounds Lungs: Clear to auscultation, breathing unlabored, no rales or wheezes Abdomen: Positive bowel sounds, soft nontender to palpation, nondistended Extremities: No clubbing, cyanosis, or edema Skin: No evidence of breakdown, no evidence of rash Neurologic: Cranial nerves II through XII intact, motor strength is 5/5 in bilateral deltoid, bicep, tricep, grip, hip flexor, knee extensors, ankle dorsiflexor and plantar flexor Sensory exam normal sensation to light touch and proprioception in bilateral upper and lower extremities Cerebellar exam normal finger to nose to finger as well as heel to shin in bilateral upper and lower extremities Musculoskeletal: Full range of motion in all 4 extremities. No joint swelling   Assessment/Plan: 1. Functional deficits which require 3+ hours per day of interdisciplinary therapy in a comprehensive inpatient rehab setting.  Physiatrist is providing close team supervision and 24 hour management of active medical problems listed below.  Physiatrist and rehab team continue to assess barriers to discharge/monitor patient progress toward functional and medical goals  Care Tool:  Bathing     Body parts bathed by patient: Right arm,Left arm,Chest,Abdomen,Front perineal area,Buttocks,Right upper leg,Left upper leg,Face   Body parts bathed by helper: Buttocks,Right lower leg,Left lower leg     Bathing assist Assist Level: Moderate Assistance - Patient 50 - 74%     Upper Body Dressing/Undressing Upper body dressing   What is the patient wearing?: Button up shirt    Upper body assist Assist Level: Moderate Assistance - Patient 50 - 74%    Lower Body Dressing/Undressing Lower body dressing      What is the patient wearing?: Incontinence brief,Pants     Lower body assist Assist for lower body dressing: Supervision/Verbal cueing     Toileting Toileting    Toileting assist Assist for toileting: Dependent - Patient 0%     Transfers Chair/bed transfer  Transfers assist     Chair/bed transfer assist level: Supervision/Verbal cueing     Locomotion Ambulation   Ambulation assist      Assist level: Supervision/Verbal cueing Assistive device: Walker-rolling Max distance: 20 ft   Walk 10 feet activity   Assist     Assist level: Contact Guard/Touching assist Assistive device: Walker-rolling   Walk 50 feet activity   Assist    Assist level: Contact Guard/Touching assist Assistive device: Walker-rolling    Walk 150 feet activity   Assist    Assist level: Contact  Guard/Touching assist Assistive device: Walker-rolling    Walk 10 feet on uneven surface  activity   Assist Walk 10 feet on uneven surfaces activity did not occur: Safety/medical concerns         Wheelchair     Assist Will patient use wheelchair at discharge?: No             Wheelchair 50 feet with 2 turns activity    Assist            Wheelchair 150 feet activity     Assist          Blood pressure 110/64, pulse 70, temperature 98.1 F (36.7 C), resp. rate 16, height 5\' 8"  (1.727 m), weight 95.2 kg, SpO2 92 %.  Medical Problem List and  Plan: 1.Functional and mobility deficitssecondary to left cerebellar infarcts after CABG -patient may shower -ELOS/Goals: 12-14 days, supervision to mod I with PT, OT, SLP 2/23  2. Antithrombotics: -DVT/anticoagulation:Pharmaceutical:Lovenox--added -antiplatelet therapy: Plavix/ASA 3. Pain Management:Oxycodone prn severe and tramadol prn moderate pain 4. Mood:LCSW to follow for evaluation and support. -antipsychotic agents: N/A 5. Neuropsych: This patientiscapable of making decisions on hisown behalf. 6. Skin/Wound Care:routine pressure relief measures.  --Monitor incisions for healing  7. Fluids/Electrolytes/Nutrition:Monitor I/O. Check lytes on Monday 8. CAD s/p CABG: Continue sternal precautions --monitor for symptoms with increased activity --On Lasix 40mg IV bid--> change to po based on weight/diuresis. Pt still a few pounds over pre-op weight --lopressorincreased to 25mg bid 2/10to help manage tachycardia -will add ACE wraps to LE to assist with LE edema. 9. HTN: Monitor BP tid-- Lasix,lopressor Vitals:   10/29/20 0342 10/29/20 0812  BP: (!) 98/54 110/64  Pulse: 66 70  Resp: 16   Temp: 98.1 F (36.7 C)   SpO2: 92%   A little lower today but asymptomatic  10. H/o DOE/Hypoxia: Encourage pulmonary hygiene --wean oxygen as able.  11. Pre-renal azotemia: Encourage fluids but monitor for signs of overaload. 12. Dysphagia: Continue DII, honey liquids --pt has a deviated septum, tube very uncomfortable -he's eating well (100% breakfast)---> dc NGT -push honeys. No IVF at this point given above Pt appears anxious about swallowing some throat irritation will rx chlorasept and ask SLP to see today , ok for po and meds in applesauce  13. ABLA: Recheck CBC 02/14 14. Stress induced hyperglycemia: Hgb A1c-5.6.  Monitor BS ac/hs and use SSI for control to help promote wound healing. 15. COPD: Used Symbicort prn at home--now on Breo Elliptica  16. Diarrhea pt on colchicine, has had this side effect in past will hold and monitor 17.  Xerostomia- likely multifactorial- saline nasal spray, d/c trazodone, may use restoril  Since no anticholinergic effect, humidified O2 LOS: 4 days A FACE TO FACE EVALUATION WAS PERFORMED  Charlett Blake 10/29/2020, 8:30 AM

## 2020-10-29 NOTE — Progress Notes (Signed)
Occupational Therapy Session Note  Patient Details  Name: Jonathan [name] MRN: [id number] Date of Birth: [birth year]  Today's Date: 10/29/2020 OT Individual Time: 3716-9678 OT Individual Time Calculation (min): 56 min    Short Term Goals: Week 1:  OT Short Term Goal 1 (Week 1): STG = LTG due to estimated LOS  Skilled Therapeutic Interventions/Progress Updates:    Pt received in recliner with wife present, req to use bathroom, agreeable to therapy. Session focus on toileting, showering, dressing, and sternal precautions ed. STS and amb to toilet to complete toilet transfer with RW + close S. Cont void of b/b. Close S for standing peri care going between BLE anteriorally and clothing management. TTB transfer with CGA, surgical incision and PICC covered. Bathed UB with close S after demonstrational cueing for sternal precaution guidelines, LB bathing with LH sponge + overall min A to bathe buttocks + mod VCs to follow sternal precautions, as pt tends to grab on to grab bars to complete STS in shower. Amb back to bed with RW + close S. Doffed shirt with mod A from wife, donned new shirt/brief/pants with close S following sternal precaution guidelines after min demonstrational cuing. Stood to complete oral care and grooming with close S + RW. Amb back to recliner and applied ACE wrap to BLE to alleviate BLE edema.  Pt left in recliner with wife present with chair alarm engaged, BLE elevated, call bell in reach, and all immediate needs met.    Therapy Documentation Precautions:  Precautions Precautions: Fall,Sternal Restrictions Weight Bearing Restrictions: Yes RUE Weight Bearing: Weight bearing as tolerated LLE Weight Bearing: Weight bearing as tolerated Pain: Pain Assessment Pain Scale: 0-10 Pain Score: 0-No pain ADL: See Care Tool for more details.   Therapy/Group: Individual Therapy  Volanda Napoleon MS, OTR/L  10/29/2020, 2:41 PM

## 2020-10-29 NOTE — Progress Notes (Signed)
Patient ID: Jonathan [name], male   DOB: 12-07-1950, 70 y.o.   MRN: [id number]  Referral faxed to [facility] OP.   [place], [place]

## 2020-10-29 NOTE — Progress Notes (Signed)
Physical Therapy Session Note  Patient Details  Name: Jonathan [name] MRN: [id number] Date of Birth: 13-Jan-1951  Today's Date: 10/29/2020 PT Individual Time: 1530-1630 PT Individual Time Calculation (min): 60 min   Short Term Goals: Week 1:  PT Short Term Goal 1 (Week 1): STG = LTG d/t ELOS  Skilled Therapeutic Interventions/Progress Updates:   Pt received sitting in recliner and agreeable to PT. Pt transferred to [facility] with RW and supervision assist. Pt then transported to entrance of [facility] in [place]. Gait training wth RW x 271f and 1565fwith supervision assist on unlevel cement sidewalk. PT attempted to assess SpO2, but unable to attain reading.  [facility] without AD x 15042fsupervision assist from PT. Pt noted to have wide BOS decreased cadence. Mild SOB upon completion of gait, SpO2 >89% and increased to 95% with 10 seconds of pursed lip breathing.   Dynamic balance training to perform ball toss off rebounder 2 x 1 min from level surface and 2 x 1 min from airex pad. Supervision assist from PT throughout with no overt LOB. Pt begins to report increasing fatigue and SOB, unable to attain SpO2 measurement.   Patient returned to room and performed stand pivot to recliner with no AD and supervision assist from PT for safety. Pt left sitting in recliner with call bell in reach and all needs met.        Therapy Documentation Precautions:  Precautions Precautions: Fall,Sternal Restrictions Weight Bearing Restrictions: Yes RUE Weight Bearing: Weight bearing as tolerated LLE Weight Bearing: Weight bearing as tolerated    Vital Signs: Therapy Vitals Temp: 98.4 F (36.9 C) Pulse Rate: 67 Resp: 18 BP: 121/63 Patient Position (if appropriate): Sitting Oxygen Therapy SpO2: 94 % O2 Device: Room Air Pain: Pain Assessment Pain Scale: 0-10 Pain Score: 0-No pain    Therapy/Group: Individual Therapy  AusLorie Phenix17/2022, 6:31 PM

## 2020-10-30 DIAGNOSIS — I639 Cerebral infarction, unspecified: Secondary | ICD-10-CM | POA: Diagnosis not present

## 2020-10-30 LAB — GLUCOSE, CAPILLARY
Glucose-Capillary: 90 mg/dL (ref 70–99)
Glucose-Capillary: 62 mg/dL — ABNORMAL LOW (ref 70–99)
Glucose-Capillary: 68 mg/dL — ABNORMAL LOW (ref 70–99)
Glucose-Capillary: 69 mg/dL — ABNORMAL LOW (ref 70–99)
Glucose-Capillary: 120 mg/dL — ABNORMAL HIGH (ref 70–99)
Glucose-Capillary: 87 mg/dL (ref 70–99)
Glucose-Capillary: 98 mg/dL (ref 70–99)

## 2020-10-30 NOTE — Progress Notes (Signed)
Physical Therapy Session Note  Patient Details  Name: Jonathan [name] MRN: [id number] Date of Birth: 04-03-1951  Today's Date: 10/30/2020 PT Individual Time: 0900-1002 PT Individual Time Calculation (min): 62 min   Short Term Goals: Week 1:  PT Short Term Goal 1 (Week 1): STG = LTG d/t ELOS  Skilled Therapeutic Interventions/Progress Updates:   Pt received sitting in recliner and agreeable to PT. Pt reports that he had just been to restroom with NT.   Gait training with RW x 165f to day room with Supervision assist from PT additional gait training with no AD 2x 1871f+[id number] supervision assist for safety.   Dynamic balance training while standing on Airex pad and engaged in Wii bowling. Supervision assist throughout with no overt LOB. Pt reports mild R foot pain, resulting in intermittent lateral weight shifting to off load R foot. Pt able to tolerate 9 frames in standing prior to requesting seated rest break  PT instructed pt in TUG:  13.05sec (average of 3 trials; >13.5 sec indicates increased fall risk) 13.88, 12.87 12.40   Agility ladder with no UE support. Supervision assist throughout from PT with cues for gait pattern and step length intermittently. Forward with 2 feet in each space x 2.  Forward with 1 foot in each space x 2  Side steppint L and R x 1 each direction.   Patient returned to room and left sitting in recline with call bell in reach and all needs met.         Therapy Documentation Precautions:  Precautions Precautions: Fall,Sternal Restrictions Weight Bearing Restrictions: No RUE Weight Bearing: Weight bearing as tolerated LLE Weight Bearing: Weight bearing as tolerated    Vital Signs: Oxygen Therapy SpO2: (!) 89 % (Pt stated that he has a hard time with cold hands, poor circulation.) O2 Device: Room Air FiO2 (%): 21 % Pain: Pain Assessment Pain Scale: 0-10 Pain Score: 4  Pain Type: Surgical pain Pain Location: Chest Pain Intervention(s):  Repositioned    Therapy/Group: Individual Therapy  AusLorie Phenix18/2022, 10:05 AM

## 2020-10-30 NOTE — Plan of Care (Signed)
  Problem: Consults Goal: RH STROKE PATIENT EDUCATION Description: See Patient Education module for education specifics  Outcome: Progressing   Problem: RH SKIN INTEGRITY Goal: RH STG SKIN FREE OF INFECTION/BREAKDOWN Description: Assess and treat any skin issue q shift and as needed  Outcome: Progressing Goal: RH STG MAINTAIN SKIN INTEGRITY WITH ASSISTANCE Description: STG Maintain Skin Integrity With mod I Assistance. Outcome: Progressing   Problem: RH SAFETY Goal: RH STG ADHERE TO SAFETY PRECAUTIONS W/ASSISTANCE/DEVICE Description: STG Adhere to Safety Precautions With Mod I Assistance/Device. Outcome: Progressing Goal: RH STG DECREASED RISK OF FALL WITH ASSISTANCE Description: STG Decreased Risk of Fall With Mod I Assistance. Outcome: Progressing   Problem: RH PAIN MANAGEMENT Goal: RH STG PAIN MANAGED AT OR BELOW PT'S PAIN GOAL Description: <2 Outcome: Progressing   Problem: RH KNOWLEDGE DEFICIT Goal: RH STG INCREASE KNOWLEDGE OF HYPERTENSION Outcome: Progressing

## 2020-10-30 NOTE — Progress Notes (Signed)
Physical Therapy Session Note  Patient Details  Name: Jonathan [name] MRN: [id number] Date of Birth: 1951/01/28  Today's Date: 10/30/2020 PT Individual Time: 1445-1600 PT Individual Time Calculation (min): 75 min   Short Term Goals: Week 1:  PT Short Term Goal 1 (Week 1): STG = LTG d/t ELOS  Skilled Therapeutic Interventions/Progress Updates:   Pt received sitting in recliner  and agreeable to PT. Pt reports incontinent bowel movement. Ambulatory transfer to bathroom for hygiene. Pt able to complete 50% of perineal and gluteal hygiene with supervision assist and 1 UE supported on rail in bathroom. PT required to complete hygiene to prevent breaking sternal precautions. All clothing management performed with supervision assist in standing  And sitting as needed.   Gait training through hall without AD x 168f, 1640fand 15068fistant supervision assist from PT with only min cues to improve gait speed as tolerated and arm swing on the R side.   Dynamic balance training instructed by PT to complete strip puzzle while standing on red wedge with min cues for problem solving and supervision assist to prevent posterior LOB.   Biodex balance training lateral and AP weight shift 1 min each. LOS 2 x 1 minute 30-35%. Random control x 1.5 min. Min-supervision assist and to improve use of ankle strategy, but pt relies heavily on hip strategy throughout.   Nustep reciprocal cardiovascular training 3 min +3 min with prolonged rest break between bouts cue sfor decreased SPM to improve activity tolerance. Borg RPE 13 and then 15 following each bout.   Patient returned to room and performed stand pivot to recliner with no AD and supervision assist. Pt left sitting in recliner with call bell in reach and all needs met.         Therapy Documentation Precautions:  Precautions Precautions: Fall,Sternal Restrictions Weight Bearing Restrictions: No RUE Weight Bearing: Weight bearing as tolerated LLE Weight  Bearing: Weight bearing as tolerated    Vital Signs: Therapy Vitals Temp: 98 F (36.7 C) Pulse Rate: 69 Resp: 19 BP: (!) 120/56 Patient Position (if appropriate): Sitting Oxygen Therapy SpO2: 98 % O2 Device: Room Air Pain: Pain Assessment Pain Scale: 0-10 Pain Score: 0-No pain    Therapy/Group: Individual Therapy  AusLorie Phenix18/2022, 4:08 PM

## 2020-10-30 NOTE — Progress Notes (Signed)
Patient ID: Jonathan [name], male   DOB: [birth year], 70 y.o.   MRN: [id number]   Patient and spouse prefer HH Vs. OP. Patient [facility] referral sent to [facility] for review.  [place], [place]

## 2020-10-30 NOTE — Progress Notes (Signed)
Occupational Therapy Session Note  Patient Details  Name: Jonathan [name] MRN: [id number] Date of Birth: [birth year]  Today's Date: 10/30/2020 OT Individual Time: 0016-4290 OT Individual Time Calculation (min): 63 min    Short Term Goals: Week 1:  OT Short Term Goal 1 (Week 1): STG = LTG due to estimated LOS  Skilled Therapeutic Interventions/Progress Updates:    Pt received in recliner eating breakfast, agreeable to therapy. Applied ACE wrap to BLE to assist in decreasing edema, total A to don/doff B gripper socks. Reviewed AE for LB dressing, pt uses reacher at baseline for donning pants, requesting one to keep in room during stay. Pt req to finish breakfast, additionally discussed OT POC, f/u recs, and occupational roles at home (gardening and reviewed role adaptations 2/2 sternal precautions). Pt with questions if follow-up therapy was necessary and if there were any financial penalties. Rec that pt complete follow-up evals for improved long-term outcomes, he is additionally req [place] services  as he reports his wife will be unable to transport him reliably to OP services 2/2 her limited driving. SW made aware. Donned B shoes/socks with total A for time. STS and amb to and from gym with RW + close S. To target improved activity tolerance, completed 1.5 min at level 6 resistance with BLE only on Nustep before reporting 7/10 mod RPE, req rest break. Req increased time for productive cough. Left in recliner with chair alarm engaged, BLE elevated, call bell in reach, and all immediate needs met.    Therapy Documentation Precautions:  Precautions Precautions: Fall,Sternal Restrictions Weight Bearing Restrictions: No RUE Weight Bearing: Weight bearing as tolerated LLE Weight Bearing: Weight bearing as tolerated Pain: Pain Assessment Pain Scale: 0-10 Pain Score: 0-No pain ADL: See Care Tool for more details.   Therapy/Group: Individual Therapy  Volanda Napoleon MS, OTR/L  10/30/2020, 7:25  AM

## 2020-10-30 NOTE — Progress Notes (Signed)
PROGRESS NOTE   Subjective/Complaints: No issues overnite, some congestion but not severe  occ wheezing noted, was not using Symbicort freq PTA Upgraded to thin liq , handling this well   ROS- no CP, SOB, N/V/D   Objective:   DG Swallowing Func-Speech Pathology  Result Date: 10/29/2020 Objective Swallowing Evaluation: Type of Study: MBS-Modified Barium Swallow Study  Patient Details Name: Jonathan [name] MRN: [id number] Date of Birth: August 21, 1951 Today's Date: 10/29/2020 Past Medical History: Past Medical History: Diagnosis Date . Arthritis   hands  . Chronic bronchitis ([place])  . Chronic sinusitis of both maxillary sinuses 04/16/2018  Sinus CT 04/25/2018 Mild mucoperiosteal thickening involving the frontoethmoidal sinuses. Otherwise largely clear sinuses with no evidence for acute sinusitis.  [name] COPD GOLD II   active smoker 04/16/2018  Spirometry 04/16/2018  FEV1 1.43 (47%)  Ratio 62  s prior rx  - 04/16/2018    try symbicort 160 2bid - PFT's  06/08/2018  FEV1 2.03 (71 % ) ratio 61  p 5 % improvement from saba p nothing prior to study with DLCO  63 % corrects to 72  % for alv volume    -  06/08/2018  After extensive coaching inhaler device,  effectiveness =    75%  (short ti/ variable flow) > continue symbicort 160 2bid  . Coronary artery disease  . Coronary artery disease involving native coronary artery of native heart with unstable angina pectoris ([place])  . DOE (dyspnea on exertion) 04/16/2018 . Essential hypertension, benign 09/23/2012  Changed acei to arb 04/16/2018 due to pseudowheeze > improved 06/08/2018  . Hernia, inguinal 09/23/2012 . Hyperlipidemia   has not tolerated statins,  would not pursue PSK9 inhibitor as advised by Dr Claiborne Billings . Hypertension  . Myocardial infarction ([place])  . NSTEMI (non-ST elevated myocardial infarction) ([place]) 09/23/2012 . 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 Tobacco abuse  Past Surgical History: Past Surgical History: Procedure  Laterality Date . CARDIAC CATHETERIZATION   . CARDIOVASCULAR STRESS TEST  11/11/2011 . CORONARY ANGIOPLASTY    stents- 01/2004  . CORONARY ARTERY BYPASS GRAFT N/A 10/15/2020  Procedure: CORONARY ARTERY BYPASS GRAFTING (CABG) TIMES FOUR USING BILATERAL INTERNAL MAMMARY ARTERIES AND ENDOSCOPICALLY HARVESTED GREATER SAPHENOUS VEIN AND INSERTION OF 50CC INTRAAORTIC BALLOON PUMP;  Surgeon: [name], MD;  Location: [place];  Service: Open Heart Surgery;  Laterality: N/A;  possible BIMA . ENDOVEIN HARVEST OF GREATER SAPHENOUS VEIN Right 10/15/2020  Procedure: ENDOVEIN HARVEST OF GREATER SAPHENOUS VEIN;  Surgeon: [name], MD;  Location: [place];  Service: Open Heart Surgery;  Laterality: Right; . heart stents  01/2004 . INGUINAL HERNIA REPAIR Right 12/06/2012  Procedure: HERNIA REPAIR INGUINAL ADULT;  Surgeon: [name], DO;  Location: [facility];  Service: General;  Laterality: Right; . INSERTION OF MESH Right 12/06/2012  Procedure: INSERTION OF MESH;  Surgeon: [name], DO;  Location: [facility];  Service: General;  Laterality: Right; . LEFT HEART CATH AND CORONARY ANGIOGRAPHY N/A 07/22/2019  Procedure: LEFT HEART CATH AND CORONARY ANGIOGRAPHY;  Surgeon: [name], MD;  Location: [place] [facility];  Service: Cardiovascular;  Laterality: N/A; . LEFT HEART CATH AND CORONARY ANGIOGRAPHY N/A 10/14/2020  Procedure: LEFT  HEART CATH AND CORONARY ANGIOGRAPHY;  Surgeon: [name], MD;  Location: [place] [facility];  Service: Cardiovascular;  Laterality: N/A; . TEE WITHOUT CARDIOVERSION N/A 10/15/2020  Procedure: TRANSESOPHAGEAL ECHOCARDIOGRAM (TEE);  Surgeon: [name], MD;  Location: [place];  Service: Open Heart Surgery;  Laterality: N/A; HPI: Pt is a 70 y.o. male presenting with coronary artery disease. Pt recieved coronary artery bypass grafting times four on 10/16/20. CXR from 10/14/20 showed bilateral pulmonary nodules and Head CT from 10/19/20 revealed small age-indeterminate infarct in the left posterior  cerebellar hemisphere (acute to subacute). Pt intubated from 10/15/20 to 10/17/20. PMH includes: COPD, hyperlipidemia, HTN, DOE, chronic bronchitis, chronic sinusitis, myocardial infarction. Pt admitted to [facility] 10/25/20.  Subjective: Pt alert and cooperative Assessment / Plan / Recommendation CHL IP CLINICAL IMPRESSIONS 10/29/2020 Clinical Impression --Pt presents with very mild oral and pharyngeal phase swallowing deficits, but excellent functional improvement in ability to protect his airway with thin liquids today. Although pt exhibits consistently reduced anterior hyolaryngeal excursion during the swallow, only one instance of flash penetration of thin barium observed on the first trial that was presented -- material began to enter the laryngeal vestibule prior to the swallow due to delayed epiglottic inversion, but remained above the vocal folds and 100% of penetrate was ejected from vestibule during the swallow. No aspiration was observed regardless of bolus size or method of delivery (cup VS straw). Pt did demonstrate difficulty coordinating dual consistency barium pill with thin liquid due to reduced posterior propulsion and lingual pumping of pill and as a result, decreased bolus cohesion. SLP would recommend pt continue dysphagia 3 solid textures, upgrade to thin liquids, continue medications crushed or whole in applesauce (depending on pt's preference). Pt should have intermittent supervision during meals to ensure use of safe swallow strategies, particularly slow rate, given pt's decreased respiratory status. ST will continue to follow pt to ensure diet safety and efficiency while inpatient. SLP Visit Diagnosis Dysphagia, oropharyngeal phase (R13.12) Attention and concentration deficit following -- Frontal lobe and executive function deficit following -- Impact on safety and function Mild aspiration risk   CHL IP TREATMENT RECOMMENDATION 10/21/2020 Treatment Recommendations Therapy as outlined in treatment plan  below   Prognosis 10/21/2020 Prognosis for Safe Diet Advancement Good Barriers to Reach Goals Time post onset;Severity of deficits Barriers/Prognosis Comment -- CHL IP DIET RECOMMENDATION 10/29/2020 SLP Diet Recommendations Dysphagia 3 (Mech soft) solids;Thin liquid Liquid Administration via Cup;Straw Medication Administration Whole meds with puree Compensations Slow rate;Small sips/bites Postural Changes Remain semi-upright after after feeds/meals (Comment);Seated upright at 90 degrees   CHL IP OTHER RECOMMENDATIONS 10/29/2020 Recommended Consults -- Oral Care Recommendations Oral care BID Other Recommendations --   CHL IP FOLLOW UP RECOMMENDATIONS 10/22/2020 Follow up Recommendations Home health SLP   CHL IP FREQUENCY AND DURATION 10/21/2020 Speech Therapy Frequency (ACUTE ONLY) min 2x/week Treatment Duration 2 weeks      CHL IP ORAL PHASE 10/29/2020 Oral Phase Impaired Oral - Pudding Teaspoon -- Oral - Pudding Cup -- Oral - Honey Teaspoon -- Oral - Honey Cup NT Oral - Nectar Teaspoon -- Oral - Nectar Cup NT Oral - Nectar Straw NT Oral - Thin Teaspoon -- Oral - Thin Cup WFL Oral - Thin Straw WFL Oral - Puree NT Oral - Mech Soft -- Oral - Regular NT Oral - Multi-Consistency -- Oral - Pill Delayed oral transit;Decreased bolus cohesion;Lingual pumping;Reduced posterior propulsion Oral Phase - Comment --  CHL IP PHARYNGEAL PHASE 10/29/2020 Pharyngeal Phase Impaired Pharyngeal- Pudding Teaspoon --  Pharyngeal -- Pharyngeal- Pudding Cup -- Pharyngeal -- Pharyngeal- Honey Teaspoon -- Pharyngeal -- Pharyngeal- Honey Cup NT Pharyngeal -- Pharyngeal- Nectar Teaspoon -- Pharyngeal -- Pharyngeal- Nectar Cup NT Pharyngeal -- Pharyngeal- Nectar Straw NT Pharyngeal -- Pharyngeal- Thin Teaspoon -- Pharyngeal -- Pharyngeal- Thin Cup Reduced anterior laryngeal mobility;Penetration/Aspiration before swallow Pharyngeal Material enters airway, remains ABOVE vocal cords then ejected out Pharyngeal- Thin Straw Reduced anterior laryngeal mobility  Pharyngeal Material does not enter airway Pharyngeal- Puree NT Pharyngeal -- Pharyngeal- Mechanical Soft -- Pharyngeal -- Pharyngeal- Regular NT Pharyngeal -- Pharyngeal- Multi-consistency -- Pharyngeal -- Pharyngeal- Pill -- Pharyngeal -- Pharyngeal Comment --  CHL IP CERVICAL ESOPHAGEAL PHASE 10/29/2020 Cervical Esophageal Phase WFL Pudding Teaspoon -- Pudding Cup -- Honey Teaspoon -- Honey Cup -- Nectar Teaspoon -- Nectar Cup -- Nectar Straw -- Thin Teaspoon -- Thin Cup -- Thin Straw -- Puree -- Mechanical Soft -- Regular -- Multi-consistency -- Pill -- Cervical Esophageal Comment -- Arbutus Leas 10/29/2020, 12:10 PM              No results for input(s): WBC, HGB, HCT, PLT in the last 72 hours. No results for input(s): NA, K, CL, CO2, GLUCOSE, BUN, CREATININE, CALCIUM in the last 72 hours.  Intake/Output Summary (Last 24 hours) at 10/30/2020 0850 Last data filed at 10/30/2020 0500 Gross per 24 hour  Intake 600 ml  Output 1500 ml  Net -900 ml        Physical Exam: Vital Signs Blood pressure 116/63, pulse 73, temperature 98.2 F (36.8 C), temperature source Oral, resp. rate 14, height 5\' 8"  (1.727 m), weight 95.2 kg, SpO2 (!) 89 %.  General: No acute distress Mood and affect are appropriate Heart: Regular rate and rhythm no rubs murmurs or extra sounds Lungs: Clear to auscultation, breathing unlabored, no rales or wheezes Abdomen: Positive bowel sounds, soft nontender to palpation, nondistended Extremities: No clubbing, cyanosis, or edema Skin: No evidence of breakdown, no evidence of rash Motor strength 5/5 in BUE and BLE   Cerebellar exam normal finger to nose to finger as well as heel to shin in bilateral upper and lower extremities Musculoskeletal: Full range of motion in all 4 extremities. No joint swelling   Assessment/Plan: 1. Functional deficits which require 3+ hours per day of interdisciplinary therapy in a comprehensive inpatient rehab setting.  Physiatrist is providing  close team supervision and 24 hour management of active medical problems listed below.  Physiatrist and rehab team continue to assess barriers to discharge/monitor patient progress toward functional and medical goals  Care Tool:  Bathing    Body parts bathed by patient: Right arm,Left arm,Chest,Abdomen,Front perineal area,Right upper leg,Left upper leg,Face,Right lower leg,Left lower leg   Body parts bathed by helper: Buttocks     Bathing assist Assist Level: Minimal Assistance - Patient > 75%     Upper Body Dressing/Undressing Upper body dressing   What is the patient wearing?: Button up shirt    Upper body assist Assist Level: Supervision/Verbal cueing    Lower Body Dressing/Undressing Lower body dressing      What is the patient wearing?: Incontinence brief,Pants     Lower body assist Assist for lower body dressing: Supervision/Verbal cueing     Toileting Toileting    Toileting assist Assist for toileting: Minimal Assistance - Patient > 75%     Transfers Chair/bed transfer  Transfers assist     Chair/bed transfer assist level: Supervision/Verbal cueing     Locomotion Ambulation   Ambulation assist  Assist level: Supervision/Verbal cueing Assistive device: Walker-rolling Max distance: 20 ft   Walk 10 feet activity   Assist     Assist level: Contact Guard/Touching assist Assistive device: Walker-rolling   Walk 50 feet activity   Assist    Assist level: Contact Guard/Touching assist Assistive device: Walker-rolling    Walk 150 feet activity   Assist    Assist level: Contact Guard/Touching assist Assistive device: Walker-rolling    Walk 10 feet on uneven surface  activity   Assist Walk 10 feet on uneven surfaces activity did not occur: Safety/medical concerns         Wheelchair     Assist Will patient use wheelchair at discharge?: No             Wheelchair 50 feet with 2 turns activity    Assist             Wheelchair 150 feet activity     Assist          Blood pressure 116/63, pulse 73, temperature 98.2 F (36.8 C), temperature source Oral, resp. rate 14, height 5\' 8"  (1.727 m), weight 95.2 kg, SpO2 (!) 89 %.  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 Antithrombotics: -DVT/anticoagulation:Pharmaceutical:Lovenox--added -antiplatelet therapy: Plavix/ASA 3. Pain Management:Oxycodone prn severe and tramadol prn moderate pain 4. Mood:LCSW to follow for evaluation and support. -antipsychotic agents: N/A 5. Neuropsych: This patientiscapable of making decisions on hisown behalf. 6. Skin/Wound Care:routine pressure relief measures.  --Monitor incisions for healing  7. Fluids/Electrolytes/Nutrition:Monitor I/O. Check lytes on Monday 8. CAD s/p CABG: Continue sternal precautions --monitor for symptoms with increased activity --On Lasix 40mg IV bid--> change to po based on weight/diuresis. Pt still a few pounds over pre-op weight --lopressorincreased to 25mg bid 2/10to help manage tachycardia -will add ACE wraps to LE to assist with LE edema. 9. HTN: Monitor BP tid-- Lasix,lopressor Vitals:   10/30/20 0345 10/30/20 0800  BP: 116/63   Pulse: 73   Resp: 14   Temp: 98.2 F (36.8 C)   SpO2: (!) 89% (!) 89%  controlled 2/18 10. H/o DOE/Hypoxia: Encourage pulmonary hygiene --wean oxygen as able.  11. Pre-renal azotemia: Encourage fluids but monitor for signs of overaload. 12. Dysphagia: Continue DII, honey liquids --pt has a deviated septum, tube very uncomfortable -he's eating well (100% breakfast)---> dc NGT -push honeys. No IVF at this  point given above Pt appears anxious about swallowing some throat irritation will rx chlorasept and ask SLP to see today , ok for po and meds in applesauce  13. ABLA: Recheck CBC 02/14 14. Stress induced hyperglycemia: Hgb A1c-5.6. Monitor BS ac/hs and use SSI for control to help promote wound healing. 15. COPD: Used Symbicort prn at home--now on Breo Elliptica  16. Diarrhea pt on colchicine, has had this side effect in past will hold and monitor 17.  Xerostomia- likely multifactorial- saline nasal spray, d/c trazodone, may use restoril  Since no anticholinergic effect, humidified O2- doing better with this  LOS: 5 days A FACE TO FACE EVALUATION WAS PERFORMED  Charlett Blake 10/30/2020, 8:50 AM

## 2020-10-30 NOTE — Progress Notes (Signed)
SLP Cancellation Note  Patient Details Name: Jonathan [name] MRN: [id number] DOB: [birth year]   Cancelled treatment:        Attempted to see pt for scheduled ST appointment.  Upon therapist's arrival, pt politely declined any offered POs, stating that he had just had lunch followed by multiple drinks and snack to "bring blood sugar up" and was too full to participate in treatment today.  Pt reports excellent toleration of recent diet upgrade with no instances of coughing, wet voice, or choking during meals.  Will continue to follow along for treatment as pt is able to participate.                                                                                                  Tavion Senkbeil, Selinda Orion 10/30/2020, 2:45 PM

## 2020-10-30 NOTE — Progress Notes (Signed)
Hypoglycemic Event  CBG: 62  Treatment: 4 oz juice/soda  Symptoms: None  Follow-up CBG: Time:1210 CBG Result:68  Possible Reasons for Event: Unknown  Comments/MD notified:4 oz of soda given and CBG taken at 1239 reading 69. Patient also at full lunch tray. PA Pam called and instruction to recheck patient in one hour. At 1334 patient CBG read 120. Patient showed no s/s of hypoglycemia throughout episode.    Jisela Merlino L Gerod Caligiuri

## 2020-10-30 NOTE — Progress Notes (Signed)
Patient ID: Jonathan [name], male   DOB: [birth year], 70 y.o.   MRN: [id number]   [facility] declined by [facility] due to staffing.  [place], [place]

## 2020-10-30 NOTE — Progress Notes (Signed)
Patient ID: Jonathan [name], male   DOB: [birth year], 70 y.o.   MRN: [id number]   Patient referral sent to [facility] for review  Erlene Quan, [place]

## 2020-10-30 NOTE — Progress Notes (Signed)
Patient ID: Jonathan [name], male   DOB: 12-12-1950, 70 y.o.   MRN: [id number]   Patient [facility] referral sent to [facility] for review.  [place], [place]

## 2020-10-30 NOTE — Progress Notes (Signed)
Patient ID: Jonathan [name], male   DOB: 1951-04-27, 69 y.o.   MRN: [id number]   Patient declined by [facility] due to area and staffing.   [place], [place]

## 2020-10-31 DIAGNOSIS — I639 Cerebral infarction, unspecified: Secondary | ICD-10-CM | POA: Diagnosis not present

## 2020-10-31 LAB — GLUCOSE, CAPILLARY
Glucose-Capillary: 109 mg/dL — ABNORMAL HIGH (ref 70–99)
Glucose-Capillary: 71 mg/dL (ref 70–99)
Glucose-Capillary: 70 mg/dL (ref 70–99)
Glucose-Capillary: 117 mg/dL — ABNORMAL HIGH (ref 70–99)

## 2020-10-31 NOTE — Progress Notes (Signed)
Occupational Therapy Session Note  Patient Details  Name: Jonathan [name] MRN: [id number] Date of Birth: Apr 30, 1951  Today's Date: 10/31/2020 OT Individual Time: 0825-0905 OT Individual Time Calculation (min): 40 min    Short Term Goals: Week 1:  OT Short Term Goal 1 (Week 1): STG = LTG due to estimated LOS  Skilled Therapeutic Interventions/Progress Updates:    Pt received in recliner, agreeable to therapy after requesting deferall earlier to finish breakfast earlier this am. Session focus on activity tolerance in prep for improved ADL/IADL performance. Donned B shoes with total A. STS and amb to ortho gym with RW + close S. Reports being "wiped out" req seated rest break. In prep for improved walk-in shower transfer, practiced stepping with BLE on randomized targets placed on 4" inch step at rapid pace. Pt req seated rest break every ~5 min. Overall, demonstrating improved dynamic standing balance with no AD to complete task. Transitioned to Nustep, to complete total of 5 min on level 6 resistance with BLE only, req rest break halfway through. Pt with episode of productive coughing fit x2 throughout session, req increased time to rest afterwards and coached on pursed lip breathing with improving satO2 from 88% on RA to 94% on RA. HR at 101 bpm after activity. Amb back to room and transferred into recliner same manner as before. Doffed B shoes with close S. Pt left in recliner with BLE elevated, chair  alarm engaged, call bell in reach, and all immediate needs met.    Therapy Documentation Precautions:  Precautions Precautions: Fall,Sternal Restrictions Weight Bearing Restrictions: No RUE Weight Bearing: Weight bearing as tolerated LLE Weight Bearing: Weight bearing as tolerated Pain: Pain Assessment Pain Scale: 0-10 Pain Score: 0-No pain ADL: See Care Tool for more details.  Therapy/Group: Individual Therapy  Volanda Napoleon MS, OTR/L  10/31/2020, 9:25 AM

## 2020-10-31 NOTE — Progress Notes (Signed)
PROGRESS NOTE   Subjective/Complaints: No complaints this morning. Denies pain, constipation, insomnia.   ROS- no CP, SOB, N/V/D, pain  Objective:   No results found. No results for input(s): WBC, HGB, HCT, PLT in the last 72 hours. No results for input(s): NA, K, CL, CO2, GLUCOSE, BUN, CREATININE, CALCIUM in the last 72 hours.  Intake/Output Summary (Last 24 hours) at 10/31/2020 1354 Last data filed at 10/31/2020 1308 Gross per 24 hour  Intake 960 ml  Output 275 ml  Net 685 ml        Physical Exam: Vital Signs Blood pressure (!) 121/53, pulse 70, temperature 98.2 F (36.8 C), resp. rate 18, height 5\' 8"  (1.727 m), weight 99.4 kg, SpO2 97 %.  Gen: no distress, normal appearing HEENT: oral mucosa pink and moist, NCAT Cardio: Reg rate Chest: normal effort, normal rate of breathing Abd: soft, non-distended Ext: no edema Psych: pleasant, normal affect Skin: intact Motor strength 5/5 in BUE and BLE   Cerebellar exam normal finger to nose to finger as well as heel to shin in bilateral upper and lower extremities Musculoskeletal: Full range of motion in all 4 extremities. No joint swelling   Assessment/Plan: 1. Functional deficits which require 3+ hours per day of interdisciplinary therapy in a comprehensive inpatient rehab setting.  Physiatrist is providing close team supervision and 24 hour management of active medical problems listed below.  Physiatrist and rehab team continue to assess barriers to discharge/monitor patient progress toward functional and medical goals  Care Tool:  Bathing    Body parts bathed by patient: Right arm,Left arm,Chest,Abdomen,Front perineal area,Right upper leg,Left upper leg,Face,Right lower leg,Left lower leg   Body parts bathed by helper: Buttocks     Bathing assist Assist Level: Minimal Assistance - Patient > 75%     Upper Body Dressing/Undressing Upper body dressing    What is the patient wearing?: Button up shirt    Upper body assist Assist Level: Supervision/Verbal cueing    Lower Body Dressing/Undressing Lower body dressing      What is the patient wearing?: Incontinence brief,Pants     Lower body assist Assist for lower body dressing: Supervision/Verbal cueing     Toileting Toileting    Toileting assist Assist for toileting: Minimal Assistance - Patient > 75%     Transfers Chair/bed transfer  Transfers assist     Chair/bed transfer assist level: Supervision/Verbal cueing     Locomotion Ambulation   Ambulation assist      Assist level: Supervision/Verbal cueing Assistive device: Walker-rolling Max distance: ~300   Walk 10 feet activity   Assist     Assist level: Contact Guard/Touching assist Assistive device: Walker-rolling   Walk 50 feet activity   Assist    Assist level: Contact Guard/Touching assist Assistive device: Walker-rolling    Walk 150 feet activity   Assist    Assist level: Contact Guard/Touching assist Assistive device: Walker-rolling    Walk 10 feet on uneven surface  activity   Assist Walk 10 feet on uneven surfaces activity did not occur: Safety/medical concerns         Wheelchair     Assist Will patient use wheelchair at discharge?: No  Wheelchair 50 feet with 2 turns activity    Assist            Wheelchair 150 feet activity     Assist          Blood pressure (!) 121/53, pulse 70, temperature 98.2 F (36.8 C), resp. rate 18, height 5\' 8"  (1.727 m), weight 99.4 kg, SpO2 97 %.  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 Antithrombotics: -DVT/anticoagulation:Pharmaceutical:Continue Lovenox--added -antiplatelet  therapy: Continue Plavix/ASA 3. Pain Management:Oxycodone prn severe and tramadol prn moderate pain 4. Mood:LCSW to follow for evaluation and support. -antipsychotic agents: N/A 5. Neuropsych: This patientiscapable of making decisions on hisown behalf. 6. Skin/Wound Care:routine pressure relief measures.  --Monitor incisions for healing  7. Fluids/Electrolytes/Nutrition:Monitor I/O. Check lytes on Monday 8. CAD s/p CABG: Continue sternal precautions --monitor for symptoms with increased activity --On Lasix 40mg IV bid--> change to po based on weight/diuresis. Pt still a few pounds over pre-op weight --lopressorincreased to 25mg bid 2/10to help manage tachycardia -will add ACE wraps to LE to assist with LE edema. 9. HTN: Monitor BP tid-- Lasix,lopressor Vitals:   10/31/20 0403 10/31/20 1312  BP: (!) 105/51 (!) 121/53  Pulse: 61 70  Resp: 14 18  Temp: 98.2 F (36.8 C) 98.2 F (36.8 C)  SpO2: (!) 88% 97%  Soft 2/19: continue to monitor.  10. H/o DOE/Hypoxia: Encourage pulmonary hygiene --wean oxygen as able.  11. Pre-renal azotemia: Encourage fluids but monitor for signs of overaload. 12. Dysphagia: Continue DII, honey liquids --pt has a deviated septum, tube very uncomfortable -he's eating well (100% breakfast)---> dc NGT -push honeys. No IVF at this point given above Pt appears anxious about swallowing some throat irritation will rx chlorasept and ask SLP to see , ok for po and meds in applesauce  13. ABLA: Recheck CBC 02/14 14. Stress induced hyperglycemia: Hgb A1c-5.6. Monitor BS ac/hs and use SSI for control to help promote wound healing. 15. COPD: Used Symbicort prn at home--now on Breo Elliptica  16. Diarrhea pt on colchicine, has had this side effect in past will hold and monitor 17.  Xerostomia- likely multifactorial- saline nasal spray, d/c  trazodone, may use restoril  Since no anticholinergic effect, humidified O2- doing better with this  LOS: 6 days A FACE TO FACE EVALUATION WAS PERFORMED  Martha Clan P Raegyn Renda 10/31/2020, 1:54 PM

## 2020-10-31 NOTE — Progress Notes (Signed)
Speech Language Pathology Daily Session Note  Patient Details  Name: Jonathan [name] MRN: [id number] Date of Birth: [birth year]  Today's Date: 10/31/2020 SLP Individual Time: 1215-1240 SLP Individual Time Calculation (min): 25 min  Short Term Goals: Week 1: SLP Short Term Goal 1 (Week 1): Pt will consume Dys 3 textures and honey thick liquids with minimal overt s/sx aspiration and efficient mastication and oral clearance with Supervision A verbal cues for use of swallowing strategies. SLP Short Term Goal 2 (Week 1): Pt will consume therapeutic trials of thin H2O with minimal overt s/sx aspiration and no appreciable change in vitals prior to repeat MBSS. SLP Short Term Goal 3 (Week 1): Pt will perform pharyngeal strengthening exercises with Supervision A verbal cues.  Skilled Therapeutic Interventions: Skilled treatment session focused on dysphagia goals. SLP facilitated session by providing skilled observation with lunch meal of Dys. 3 textures with thin liquids. Patient consumed meal without overt s/s of aspiration and was overall Mod I for use of swallowing compensatory strategies. Recommend patient continue current diet. Patient left upright in recliner with all needs within reach. Continue with current plan of care.      Pain No/Denies Pain  Therapy/Group: Individual Therapy  Tianna Baus 10/31/2020, 2:27 PM

## 2020-10-31 NOTE — Progress Notes (Signed)
Physical Therapy Session Note  Patient Details  Name: Jonathan [name] MRN: [id number] Date of Birth: 08/10/1951  Today's Date: 10/31/2020 PT Individual Time: 1300-1415 PT Individual Time Calculation (min): 75 min   Short Term Goals: Week 1:  PT Short Term Goal 1 (Week 1): STG = LTG d/t ELOS  Skilled Therapeutic Interventions/Progress Updates:   Pt received sitting in WC and agreeable to PT. Gait training through rehab unit with supervision assist from PT without AD x 200ft, 180ft, 60ft and 45ft. Min cues for increased arm swing to improve reciprocal gait pattern.  Dynamic gait training to navigate obstacle course to weave through cones, step over unlevel blue mat, across yoga step, and over canes on floor performed x 8 with varying position of obstacles and supervision asisst overall. Mild LOB across yoga step, but able to correct without assist from PT.   Dynamic balance training instructed by PT to perform putting with automotic return from airex pad x 3 min and form red wedge x 3 minutes. Attempted blue wedge, but pt reports pain in Bil calves. Min-supevisoin assist with 2 near LOB but able to performed stepping strategy off pads to prevent fall.   Nustep reciprocal activity tolerance training x 6 min BLE only, level 4-5. Pt reports Borg PRE 13-14. Cues for pursed lip breathing throughout. Unable to attain SpO2 measurement.   Patient returned to room and performed stand pivot to recliner with supervision assist and no AD. PT wrapped BLE to assist with edema management. Pt left sitting in recliner with call bell in reach and all needs met.         Therapy Documentation Precautions:  Precautions Precautions: Fall,Sternal Restrictions Weight Bearing Restrictions: No RUE Weight Bearing: Weight bearing as tolerated LLE Weight Bearing: Weight bearing as tolerated Vital Signs: Therapy Vitals Temp: 98.2 F (36.8 C) Temp Source: Oral Pulse Rate: 70 Resp: 18 BP: (!) 121/53 Patient  Position (if appropriate): Sitting Oxygen Therapy SpO2: 97 % O2 Device: Room Air Pain: denies   Therapy/Group: Individual Therapy  [name] 10/31/2020, 4:51 PM  

## 2020-10-31 NOTE — Plan of Care (Signed)
  Problem: Consults Goal: RH STROKE PATIENT EDUCATION Description: See Patient Education module for education specifics  Outcome: Progressing   Problem: RH SKIN INTEGRITY Goal: RH STG SKIN FREE OF INFECTION/BREAKDOWN Description: Assess and treat any skin issue q shift and as needed  Outcome: Progressing Goal: RH STG MAINTAIN SKIN INTEGRITY WITH ASSISTANCE Description: STG Maintain Skin Integrity With mod I Assistance. Outcome: Progressing   Problem: RH SAFETY Goal: RH STG ADHERE TO SAFETY PRECAUTIONS W/ASSISTANCE/DEVICE Description: STG Adhere to Safety Precautions With Mod I Assistance/Device. Outcome: Progressing Goal: RH STG DECREASED RISK OF FALL WITH ASSISTANCE Description: STG Decreased Risk of Fall With Mod I Assistance. Outcome: Progressing   Problem: RH PAIN MANAGEMENT Goal: RH STG PAIN MANAGED AT OR BELOW PT'S PAIN GOAL Description: <2 Outcome: Progressing   Problem: RH KNOWLEDGE DEFICIT Goal: RH STG INCREASE KNOWLEDGE OF HYPERTENSION Outcome: Progressing

## 2020-11-01 DIAGNOSIS — I639 Cerebral infarction, unspecified: Secondary | ICD-10-CM | POA: Diagnosis not present

## 2020-11-01 LAB — GLUCOSE, CAPILLARY
Glucose-Capillary: 87 mg/dL (ref 70–99)
Glucose-Capillary: 81 mg/dL (ref 70–99)

## 2020-11-01 NOTE — Progress Notes (Signed)
PROGRESS NOTE   Subjective/Complaints: No complaints this morning Jonathan [name] mentioned he has had hypoglycemic episodes after receiving SSI. CBGs have been quite well controlled- d/ced SSI and CBG checks BP soft  ROS- denies CP, SOB, N/V/D, pain  Objective:   No results found. No results for input(s): WBC, HGB, HCT, PLT in the last 72 hours. No results for input(s): NA, K, CL, CO2, GLUCOSE, BUN, CREATININE, CALCIUM in the last 72 hours.  Intake/Output Summary (Last 24 hours) at 11/01/2020 1016 Last data filed at 11/01/2020 0950 Gross per 24 hour  Intake 480 ml  Output 1425 ml  Net -945 ml        Physical Exam: Vital Signs Blood pressure (!) 119/56, pulse 65, temperature 98.1 F (36.7 C), temperature source Oral, resp. rate 16, height 5\' 8"  (1.727 m), weight 99.2 kg, SpO2 96 %.  Gen: no distress, normal appearing HEENT: oral mucosa pink and moist, NCAT Cardio: Reg rate Chest: normal effort, normal rate of breathing Abd: soft, non-distended Ext: no edema Psych: pleasant, normal affect Skin: intact Neuro: Cerebellar exam normal finger to nose to finger as well as heel to shin in bilateral upper and lower extremities Musculoskeletal: Full range of motion in all 4 extremities. No joint swelling   Assessment/Plan: 1. Functional deficits which require 3+ hours per day of interdisciplinary therapy in a comprehensive inpatient rehab setting.  Physiatrist is providing close team supervision and 24 hour management of active medical problems listed below.  Physiatrist and rehab team continue to assess barriers to discharge/monitor patient progress toward functional and medical goals  Care Tool:  Bathing    Body parts bathed by patient: Right arm,Left arm,Chest,Abdomen,Front perineal area,Right upper leg,Left upper leg,Face,Right lower leg,Left lower leg   Body parts bathed by helper: Buttocks     Bathing assist Assist  Level: Minimal Assistance - Patient > 75%     Upper Body Dressing/Undressing Upper body dressing   What is the patient wearing?: Button up shirt    Upper body assist Assist Level: Supervision/Verbal cueing    Lower Body Dressing/Undressing Lower body dressing      What is the patient wearing?: Incontinence brief,Pants     Lower body assist Assist for lower body dressing: Supervision/Verbal cueing     Toileting Toileting    Toileting assist Assist for toileting: Minimal Assistance - Patient > 75%     Transfers Chair/bed transfer  Transfers assist     Chair/bed transfer assist level: Supervision/Verbal cueing     Locomotion Ambulation   Ambulation assist      Assist level: Supervision/Verbal cueing Assistive device: Walker-rolling Max distance: ~300   Walk 10 feet activity   Assist     Assist level: Contact Guard/Touching assist Assistive device: Walker-rolling   Walk 50 feet activity   Assist    Assist level: Contact Guard/Touching assist Assistive device: Walker-rolling    Walk 150 feet activity   Assist    Assist level: Contact Guard/Touching assist Assistive device: Walker-rolling    Walk 10 feet on uneven surface  activity   Assist Walk 10 feet on uneven surfaces activity did not occur: Safety/medical concerns  Wheelchair     Assist Will patient use wheelchair at discharge?: No             Wheelchair 50 feet with 2 turns activity    Assist            Wheelchair 150 feet activity     Assist          Blood pressure (!) 119/56, pulse 65, temperature 98.1 F (36.7 C), temperature source Oral, resp. rate 16, height 5\' 8"  (1.727 m), weight 99.2 kg, SpO2 96 %.  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 Antithrombotics: -DVT/anticoagulation:Pharmaceutical:Continue Lovenox--added -antiplatelet therapy: Continue Plavix/ASA 3. Pain Management:ContinueOxycodone prn severe and tramadol prn moderate pain 4. Mood:LCSW to follow for evaluation and support. -antipsychotic agents: N/A 5. Neuropsych: This patientiscapable of making decisions on hisown behalf. 6. Skin/Wound Care:routine pressure relief measures.  --Monitor incisions for healing  7. Fluids/Electrolytes/Nutrition:Monitor I/O. Check lytes on Monday 8. CAD s/p CABG: Continue sternal precautions --monitor for symptoms with increased activity --On Lasix 40mg IV bid--> change to po based on weight/diuresis. Pt still a few pounds over pre-op weight --lopressorincreased to 25mg bid 2/10to help manage tachycardia -will add ACE wraps to LE to assist with LE edema. 9. HTN: Monitor BP tid-- Lasix,lopressor Vitals:   10/31/20 1938 11/01/20 0348  BP: 116/68 (!) 119/56  Pulse: 86 65  Resp: 16 16  Temp: 99.7 F (37.6 C) 98.1 F (36.7 C)  SpO2: 95% 96%  Soft 2/19: continue to monitor.  10. H/o DOE/Hypoxia: Encourage pulmonary hygiene --wean oxygen as able.  11. Pre-renal azotemia: Encourage fluids but monitor for signs of overaload. 12. Dysphagia: Continue DII, honey liquids --pt has a deviated septum, tube very uncomfortable -he's eating well (100% breakfast)---> dc NGT -push honeys. No IVF at this point given above Pt appears anxious about swallowing some throat irritation will rx chlorasept and ask SLP to see , ok for po and meds in applesauce  13. ABLA: Recheck CBC 02/14 14. Stress induced hyperglycemia: Hgb A1c-5.6.   2/20: has been hypoglyemic after receiving SSI- CBGs reviewed and well controlled- d/ced SSI and CBG checks 15. COPD: Used Symbicort prn at  home--now on Breo Elliptica  16. Diarrhea pt on colchicine, has had this side effect in past will hold and monitor 17.  Xerostomia- likely multifactorial- saline nasal spray, d/c trazodone, may use restoril  Since no anticholinergic effect, humidified O2- doing better with this  LOS: 7 days A FACE TO FACE EVALUATION WAS PERFORMED  Jonathan [name] 11/01/2020, 10:16 AM

## 2020-11-01 NOTE — Plan of Care (Signed)
  Problem: Consults Goal: RH STROKE PATIENT EDUCATION Description: See Patient Education module for education specifics  Outcome: Progressing   Problem: RH SKIN INTEGRITY Goal: RH STG SKIN FREE OF INFECTION/BREAKDOWN Description: Assess and treat any skin issue q shift and as needed  Outcome: Progressing Goal: RH STG MAINTAIN SKIN INTEGRITY WITH ASSISTANCE Description: STG Maintain Skin Integrity With mod I Assistance. Outcome: Progressing   Problem: RH SAFETY Goal: RH STG ADHERE TO SAFETY PRECAUTIONS W/ASSISTANCE/DEVICE Description: STG Adhere to Safety Precautions With Mod I Assistance/Device. Outcome: Progressing Goal: RH STG DECREASED RISK OF FALL WITH ASSISTANCE Description: STG Decreased Risk of Fall With Mod I Assistance. Outcome: Progressing   Problem: RH PAIN MANAGEMENT Goal: RH STG PAIN MANAGED AT OR BELOW PT'S PAIN GOAL Description: <2 Outcome: Progressing   Problem: RH KNOWLEDGE DEFICIT Goal: RH STG INCREASE KNOWLEDGE OF HYPERTENSION Outcome: Progressing

## 2020-11-02 ENCOUNTER — Ambulatory Visit: Payer: Medicare Other | Admitting: Cardiothoracic Surgery

## 2020-11-02 DIAGNOSIS — I639 Cerebral infarction, unspecified: Secondary | ICD-10-CM | POA: Diagnosis not present

## 2020-11-02 LAB — CBC
HCT: 29.9 % — ABNORMAL LOW (ref 39.0–52.0)
Hemoglobin: 9.3 g/dL — ABNORMAL LOW (ref 13.0–17.0)
MCH: 31.3 pg (ref 26.0–34.0)
MCHC: 31.1 g/dL (ref 30.0–36.0)
MCV: 100.7 fL — ABNORMAL HIGH (ref 80.0–100.0)
Platelets: 533 10*3/uL — ABNORMAL HIGH (ref 150–400)
RBC: 2.97 MIL/uL — ABNORMAL LOW (ref 4.22–5.81)
RDW: 12.9 % (ref 11.5–15.5)
WBC: 7.8 10*3/uL (ref 4.0–10.5)
nRBC: 0 % (ref 0.0–0.2)

## 2020-11-02 LAB — BASIC METABOLIC PANEL
Anion gap: 9 (ref 5–15)
BUN: 13 mg/dL (ref 8–23)
CO2: 28 mmol/L (ref 22–32)
Calcium: 8.3 mg/dL — ABNORMAL LOW (ref 8.9–10.3)
Chloride: 98 mmol/L (ref 98–111)
Creatinine, Ser: 1.17 mg/dL (ref 0.61–1.24)
GFR, Estimated: >60 mL/min (ref >60)
Glucose, Bld: 96 mg/dL (ref 70–99)
Potassium: 3.9 mmol/L (ref 3.5–5.1)
Sodium: 135 mmol/L (ref 135–145)

## 2020-11-02 MED ORDER — METOPROLOL TARTRATE 25 MG/10 ML ORAL SUSPENSION
12.5000 mg | Freq: Two times a day (BID) | ORAL | Status: DC
  Administered 2020-11-02 – 2020-11-04 (×4): 12.5 mg
  Filled 2020-11-02 (×4): qty 5

## 2020-11-02 NOTE — Progress Notes (Signed)
Occupational Therapy Session Note  Patient Details  Name: DEVONN GIAMPIETRO MRN: [id number] Date of Birth: 01/23/1951  Today's Date: 11/02/2020 OT Individual Time: 1305-1400 OT Individual Time Calculation (min): 55 min    Short Term Goals: Week 1:  OT Short Term Goal 1 (Week 1): STG = LTG due to estimated LOS  Skilled Therapeutic Interventions/Progress Updates:    Pt received sitting in recliner with no c/o pain at rest. Pt had coughing spell where he was cued to use pillow to brace for pain relief. Pt completed sit > stand with supervision. 200 ft of functional mobility with supervision without AD. Pt required seated rest breaks throughout session with desaturations to 88% after activity but rebounding within a minute. Pt completed 3x 20 reciprocal stepping activity to address endurance and dynamic standing balance. Next pt completed agility ladder task x3 trials with focus on coordination, endurance, and dynamic standing balance. Supervision overall provided. 88% Spo2 following activity but rebounded to 95% within 90 seconds. Extended rest break for O2 rebound and fatigue required between each set. Pt returned to his room and was left sitting up in the recliner with all needs met, chair alarm set.   Therapy Documentation Precautions:  Precautions Precautions: Fall,Sternal Restrictions Weight Bearing Restrictions: No RUE Weight Bearing: Weight bearing as tolerated LLE Weight Bearing: Weight bearing as tolerated   Therapy/Group: Individual Therapy  Curtis Sites 11/02/2020, 12:49 PM

## 2020-11-02 NOTE — Plan of Care (Signed)
  Problem: Consults Goal: RH STROKE PATIENT EDUCATION Description: See Patient Education module for education specifics  11/02/2020 0950 by Sanda Linger, LPN Outcome: Progressing 11/02/2020 0946 by Sanda Linger, LPN Outcome: Progressing   Problem: RH SKIN INTEGRITY Goal: RH STG SKIN FREE OF INFECTION/BREAKDOWN Description: Assess and treat any skin issue q shift and as needed  11/02/2020 0950 by Renda Rolls L, LPN Outcome: Progressing 11/02/2020 0946 by Sanda Linger, LPN Outcome: Progressing Goal: RH STG MAINTAIN SKIN INTEGRITY WITH ASSISTANCE Description: STG Maintain Skin Integrity With mod I Assistance. 11/02/2020 0950 by Renda Rolls L, LPN Outcome: Progressing 11/02/2020 0946 by Sanda Linger, LPN Outcome: Progressing   Problem: RH SAFETY Goal: RH STG ADHERE TO SAFETY PRECAUTIONS W/ASSISTANCE/DEVICE Description: STG Adhere to Safety Precautions With Mod I Assistance/Device. 11/02/2020 0950 by Renda Rolls L, LPN Outcome: Progressing 11/02/2020 0946 by Sanda Linger, LPN Outcome: Progressing Goal: RH STG DECREASED RISK OF FALL WITH ASSISTANCE Description: STG Decreased Risk of Fall With Mod I Assistance. 11/02/2020 0950 by Sanda Linger, LPN Outcome: Progressing 11/02/2020 0946 by Sanda Linger, LPN Outcome: Progressing   Problem: RH PAIN MANAGEMENT Goal: RH STG PAIN MANAGED AT OR BELOW PT'S PAIN GOAL Description: <2 11/02/2020 0950 by Renda Rolls L, LPN Outcome: Progressing 11/02/2020 0946 by Sanda Linger, LPN Outcome: Progressing   Problem: RH KNOWLEDGE DEFICIT Goal: RH STG INCREASE KNOWLEDGE OF HYPERTENSION 11/02/2020 0950 by Renda Rolls L, LPN Outcome: Progressing 11/02/2020 0946 by Sanda Linger, LPN Outcome: Progressing

## 2020-11-02 NOTE — Progress Notes (Addendum)
Patient ID: Jonathan [name], male   DOB: [birth year], 70 y.o.   MRN: [id number]   Patient accepted by [facility]. No OT availability currently, patient will receive PT follow up. Orders faxed.  [place], [place]

## 2020-11-02 NOTE — Plan of Care (Signed)
  Problem: Consults Goal: RH STROKE PATIENT EDUCATION Description: See Patient Education module for education specifics  Outcome: Progressing   Problem: RH SKIN INTEGRITY Goal: RH STG SKIN FREE OF INFECTION/BREAKDOWN Description: Assess and treat any skin issue q shift and as needed  Outcome: Progressing Goal: RH STG MAINTAIN SKIN INTEGRITY WITH ASSISTANCE Description: STG Maintain Skin Integrity With mod I Assistance. Outcome: Progressing   Problem: RH SAFETY Goal: RH STG ADHERE TO SAFETY PRECAUTIONS W/ASSISTANCE/DEVICE Description: STG Adhere to Safety Precautions With Mod I Assistance/Device. Outcome: Progressing Goal: RH STG DECREASED RISK OF FALL WITH ASSISTANCE Description: STG Decreased Risk of Fall With Mod I Assistance. Outcome: Progressing   Problem: RH PAIN MANAGEMENT Goal: RH STG PAIN MANAGED AT OR BELOW PT'S PAIN GOAL Description: <2 Outcome: Progressing   Problem: RH KNOWLEDGE DEFICIT Goal: RH STG INCREASE KNOWLEDGE OF HYPERTENSION Outcome: Progressing

## 2020-11-02 NOTE — Progress Notes (Signed)
PROGRESS NOTE   Subjective/Complaints:  No issues overnight, wife is concerned about foot swelling. This has not increased. The patient does not have any foot pain. He is using Ace wraps. He is trying to keep his feet elevated. He has been taking Lasix 40 mg twice daily Wife also has questions regarding equipment, asked her to contact social work about this.  ROS- denies CP, SOB, N/V/D, pain  Objective:   No results found. Recent Labs    11/02/20 0434  WBC 7.8  HGB 9.3*  HCT 29.9*  PLT 533*   Recent Labs    11/02/20 0434  NA 135  K 3.9  CL 98  CO2 28  GLUCOSE 96  BUN 13  CREATININE 1.17  CALCIUM 8.3*    Intake/Output Summary (Last 24 hours) at 11/02/2020 5188 Last data filed at 11/02/2020 0856 Gross per 24 hour  Intake 720 ml  Output 1125 ml  Net -405 ml        Physical Exam: Vital Signs Blood pressure 105/66, pulse 66, temperature 97.9 F (36.6 C), resp. rate 14, height 5\' 8"  (1.727 m), weight 98.4 kg, SpO2 93 %.   General: No acute distress Mood and affect are appropriate Heart: Regular rate and rhythm no rubs murmurs or extra sounds Lungs: Clear to auscultation, breathing unlabored, no rales or wheezes Abdomen: Positive bowel sounds, soft nontender to palpation, nondistended Extremities: No clubbing, cyanosis, or edema Skin: No evidence of breakdown, no evidence of rash   Neuro: Cerebellar exam normal finger to nose to finger as well as heel to shin in bilateral upper and lower extremities Musculoskeletal: Full range of motion in all 4 extremities. No joint swelling   Assessment/Plan: 1. Functional deficits which require 3+ hours per day of interdisciplinary therapy in a comprehensive inpatient rehab setting.  Physiatrist is providing close team supervision and 24 hour management of active medical problems listed below.  Physiatrist and rehab team continue to assess barriers to  discharge/monitor patient progress toward functional and medical goals  Care Tool:  Bathing    Body parts bathed by patient: Right arm,Left arm,Chest,Abdomen,Front perineal area,Right upper leg,Left upper leg,Face,Right lower leg,Left lower leg   Body parts bathed by helper: Buttocks     Bathing assist Assist Level: Minimal Assistance - Patient > 75%     Upper Body Dressing/Undressing Upper body dressing   What is the patient wearing?: Button up shirt    Upper body assist Assist Level: Supervision/Verbal cueing    Lower Body Dressing/Undressing Lower body dressing      What is the patient wearing?: Incontinence brief,Pants     Lower body assist Assist for lower body dressing: Supervision/Verbal cueing     Toileting Toileting    Toileting assist Assist for toileting: Minimal Assistance - Patient > 75%     Transfers Chair/bed transfer  Transfers assist     Chair/bed transfer assist level: Supervision/Verbal cueing     Locomotion Ambulation   Ambulation assist      Assist level: Supervision/Verbal cueing Assistive device: Walker-rolling Max distance: ~300   Walk 10 feet activity   Assist     Assist level: Contact Guard/Touching assist Assistive device: Walker-rolling  Walk 50 feet activity   Assist    Assist level: Contact Guard/Touching assist Assistive device: Walker-rolling    Walk 150 feet activity   Assist    Assist level: Contact Guard/Touching assist Assistive device: Walker-rolling    Walk 10 feet on uneven surface  activity   Assist Walk 10 feet on uneven surfaces activity did not occur: Safety/medical concerns         Wheelchair     Assist Will patient use wheelchair at discharge?: No             Wheelchair 50 feet with 2 turns activity    Assist            Wheelchair 150 feet activity     Assist          Blood pressure 105/66, pulse 66, temperature 97.9 F (36.6 C), resp. rate  14, height 5\' 8"  (1.727 m), weight 98.4 kg, SpO2 93 %.  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 Antithrombotics: -DVT/anticoagulation:Pharmaceutical:Continue Lovenox--added, add foot pumps to help with foot swelling. -antiplatelet therapy: Continue Plavix/ASA 3. Pain Management:ContinueOxycodone prn severe and tramadol prn moderate pain 4. Mood:LCSW to follow for evaluation and support. -antipsychotic agents: N/A 5. Neuropsych: This patientiscapable of making decisions on hisown behalf. 6. Skin/Wound Care:routine pressure relief measures.  --Monitor incisions for healing  7. Fluids/Electrolytes/Nutrition:Monitor I/O. Check lytes on Monday 8. CAD s/p CABG: Continue sternal precautions --monitor for symptoms with increased activity --On Lasix 40mg IV bid--> change to po based on weight/diuresis. Pt still a few pounds over pre-op weight --lopressorincreased to 25mg bid 2/10to help manage tachycardia -will add ACE wraps to LE to assist with LE edema. 9. HTN: Monitor BP tid-- Lasix,lopressor Vitals:   11/02/20 0339 11/02/20 0733  BP: 105/66   Pulse: 66   Resp: 14   Temp: 97.9 F (36.6 C)   SpO2: 93% 93%  Soft 2/21, no dizziness, changed to metoprolol 12.5 twice daily 10. H/o DOE/Hypoxia: Encourage pulmonary hygiene --wean oxygen as able.  11. Pre-renal azotemia: Encourage fluids but monitor for signs of overaload. 12. Dysphagia: Continue DII, honey liquids --pt has a deviated septum, tube very uncomfortable -he's eating well (100% breakfast)---> dc NGT -push honeys. No IVF at this point given above Pt appears anxious about swallowing some  throat irritation will rx chlorasept and ask SLP to see , ok for po and meds in applesauce  13. ABLA: Recheck hemoglobin dropped from 10.3-9.3 since last week however BUN corrected from 32to13 so this is likely dilutional. 14. Stress induced hyperglycemia: Hgb A1c-5.6.   2/20: has been hypoglyemic after receiving SSI- CBGs reviewed and well controlled- d/ced SSI and CBG checks 15. COPD: Used Symbicort prn at home--now on Breo Elliptica  16. Diarrhea improved off colchicine  17.  Xerostomia-improved  likely multifactorial- saline nasal spray, d/c trazodone, may use restoril  Since no anticholinergic effect, humidified O2- doing better with this  LOS: 8 days A FACE TO FACE EVALUATION WAS PERFORMED  Charlett Blake 11/02/2020, 9:22 AM

## 2020-11-02 NOTE — Progress Notes (Signed)
Pt being noncompliant with speech recommendations stating "I don't have problem with pills" Pt insistent on taking pills whole with water and refusing to be sat up. Pt also expresses frustration with having CHG wipes stating "they already did that today" Pt instructed on CHG wipe schedule. Pt then states "I will start counting now"

## 2020-11-02 NOTE — Progress Notes (Signed)
Physical Therapy Session Note  Patient Details  Name: Jonathan [name] MRN: [id number] Date of Birth: 12/23/1950  Today's Date: 11/02/2020 PT Individual Time: 0902-1000 and 1430-1527 PT Individual Time Calculation (min):  58 min and 57 min   Short Term Goals: Week 1:  PT Short Term Goal 1 (Week 1): STG = LTG d/t ELOS Week 2:     Skilled Therapeutic Interventions/Progress Updates:    Session 1: Patient seated in recliner upon PT arrival. Patient alert and agreeable to PT session. Patient denied pain during session. Pt noted that no one had applied his edema wrapping yet. Short stretch wrapping located and applied to Bil lower legs for daily edema management with 25% stretch applied.   Therapeutic Activity: Transfers: Patient performed STS and SPVT transfers throughout session with mod I. No vc provided as pt does well to use BUE to reach back for armrests prior to each descent to sit. Pt to continue under supervision in room at this time as he does continue to demo bouts of coughing requiring hands to chest or item for support.   Gait Training:  Patient ambulated >400 feet with no AD and supervision. Continued intermittent but infrequent bouts of coughing some times with exertion. Pt challenged with head turns, changes in speed. He demos no variance in path or balance.   Neuromuscular Re-ed: NMR facilitated during session with focus on dynamic gait and balance. Pt guided in dynamic stepping and ladder drills including forward, backward, diagonal and narrow <> wide stepping patterns throughout. NMR performed for improvements in motor control and coordination, balance, sequencing, judgement, and self confidence/ efficacy in performing all aspects of mobility at highest level of independence as well as to improve overall activity tolerance. Pt's balance challenged with stance on Airex with EO, EC conditions and with self perturbations in arm movements to front and sides. He is also able to resist up  to moderate perturbations from PT.  1x10 minisquats performed on Airex with no UE support and good ankle strategy noted to maintain balance.   Patient seated in recliner at end of session with brakes locked, seat alarm set, and all needs within reach.   Session 2: Patient in bathroom with RN present upon PT arrival and taking over supervision. Pt completes toileting with distant supervision and will most likely be upgraded to Mod I tomorrow during day with supervision to bathroom at night. Patient alert and agreeable to PT session with no pain complaint throughout.  Therapeutic Activity: Transfers: Patient guided in furniture transfers to/ from recliner and sofa in apartment. Pt is able to perform each with supervision. Toilet transfer performed Mod I/ supervision.    Pt performs 5x STS test in 23.31 seconds. No AD was used and pt had a bout of coughing after the third rep requiring extra time.   Gait Training:  Patient ambulated > 400 feet with no AD and supervision. Challenged with changes in speed including greatly reduced pace with increased step height to challenge single leg stance in dynamic movement. No LOB noted though pt does use increase in stepping pace to compensate for balance.   Pt performed TUG with average score of 14.6 seconds.    Patient seated in recliner at end of session with brakes locked, chair alarm set, and all needs within reach.   Therapy Documentation Precautions:  Precautions Precautions: Fall,Sternal Restrictions Weight Bearing Restrictions: No RUE Weight Bearing: Weight bearing as tolerated LLE Weight Bearing: Weight bearing as tolerated  Therapy/Group: Individual Therapy  Almyra Free  A Robet Crutchfield 11/02/2020, 4:08 PM

## 2020-11-03 ENCOUNTER — Ambulatory Visit: Payer: Medicare Other | Admitting: General Practice

## 2020-11-03 DIAGNOSIS — I639 Cerebral infarction, unspecified: Secondary | ICD-10-CM | POA: Diagnosis not present

## 2020-11-03 NOTE — Plan of Care (Signed)
  Problem: RH Balance Goal: LTG: Patient will maintain dynamic sitting balance (OT) Description: LTG:  Patient will maintain dynamic sitting balance with assistance during activities of daily living (OT) Outcome: Completed/Met Goal: LTG Patient will maintain dynamic standing with ADLs (OT) Description: LTG:  Patient will maintain dynamic standing balance with assist during activities of daily living (OT)  Outcome: Completed/Met   Problem: Sit to Stand Goal: LTG:  Patient will perform sit to stand in prep for activites of daily living with assistance level (OT) Description: LTG:  Patient will perform sit to stand in prep for activites of daily living with assistance level (OT) Outcome: Completed/Met   Problem: RH Grooming Goal: LTG Patient will perform grooming w/assist,cues/equip (OT) Description: LTG: Patient will perform grooming with assist, with/without cues using equipment (OT) Outcome: Completed/Met   Problem: RH Bathing Goal: LTG Patient will bathe all body parts with assist levels (OT) Description: LTG: Patient will bathe all body parts with assist levels (OT) Outcome: Completed/Met   Problem: RH Dressing Goal: LTG Patient will perform upper body dressing (OT) Description: LTG Patient will perform upper body dressing with assist, with/without cues (OT). Outcome: Completed/Met Goal: LTG Patient will perform lower body dressing w/assist (OT) Description: LTG: Patient will perform lower body dressing with assist, with/without cues in positioning using equipment (OT) Outcome: Completed/Met   Problem: RH Toileting Goal: LTG Patient will perform toileting task (3/3 steps) with assistance level (OT) Description: LTG: Patient will perform toileting task (3/3 steps) with assistance level (OT)  Outcome: Completed/Met   Problem: RH Toilet Transfers Goal: LTG Patient will perform toilet transfers w/assist (OT) Description: LTG: Patient will perform toilet transfers with assist,  with/without cues using equipment (OT) Outcome: Completed/Met   Problem: RH Tub/Shower Transfers Goal: LTG Patient will perform tub/shower transfers w/assist (OT) Description: LTG: Patient will perform tub/shower transfers with assist, with/without cues using equipment (OT) Outcome: Completed/Met   

## 2020-11-03 NOTE — Progress Notes (Signed)
Speech Language Pathology Discharge Summary  Patient Details  Name: Jonathan [name] MRN: [id number] Date of Birth: [birth year]  Today's Date: 11/03/2020 SLP Individual Time: 1300-1330 SLP Individual Time Calculation (min): 30 min   Skilled Therapeutic Interventions:  Pt was seen for skilled ST targeting dysphagia goals and education with pt and his wife. Education focused on emphasizing importance of compensatory strategies related to rate of intake too best coordinate breathing and swallowing sequence, given pt's decreased respiratory status and tendency to become SOB. Pt and his wife report the dysphagia 3 textures are in line with pt's baseline diet, with the exception of an occasional steak. SLP discussed that pt should be appropriate from oral and pharyngeal swallow standpoint to trial steak again in small amounts when desired. All questions answered to pt and his wife's satisfaction. Pt also consumed a dysphagia 3 texture snack with thin H2O while SLP was present, demonstrating Mod I use of his swallowing strategies and no overt s/sx aspiration. Recommend continue current diet. Pt left sitting in recliner with needs within reach and wife still present. Continue per current plan of care.   Patient has met 3 of 3 long term goals.  Patient to discharge at overall Modified Independent level.  Reasons goals not met: n/a   Clinical Impression/Discharge Summary:   Pt made functional gains and met 3 out of 3 long term goals this admission. Pt participated in dysphagia therapy and repeat MBSS while inpatient and has been upgraded to a dysphagia 3 texture (mechanical soft) diet with thin liquids. He is Mod I for use of compensatory swallowing strategies and has been exhibiting extremely minimal overt s/sx aspiration during his intake. Pt has demonstrated improved carryover of swallowing strategies regarding decreased respiratory status and safe swallowing. Pt indicates that dysphagia 3 textures are also in  line with his baseline diet. Therefore, no skilled follow up ST is indicated. Pt and family education is complete at this time.   Care Partner:  Caregiver Able to Provide Assistance: Yes  Type of Caregiver Assistance: Physical;Cognitive  Recommendation:  None  Rationale for SLP Follow Up: Other (comment) (n/a for ST)   Equipment: none   Reasons for discharge: Discharged from hospital   Patient/Family Agrees with Progress Made and Goals Achieved: Yes    Arbutus Leas 11/03/2020, 7:30 AM

## 2020-11-03 NOTE — Progress Notes (Signed)
PROGRESS NOTE   Subjective/Complaints:  No issues overnite  ROS- denies CP, SOB, N/V/D, pain  Objective:   No results found. Recent Labs    11/02/20 0434  WBC 7.8  HGB 9.3*  HCT 29.9*  PLT 533*   Recent Labs    11/02/20 0434  NA 135  K 3.9  CL 98  CO2 28  GLUCOSE 96  BUN 13  CREATININE 1.17  CALCIUM 8.3*    Intake/Output Summary (Last 24 hours) at 11/03/2020 0823 Last data filed at 11/03/2020 0342 Gross per 24 hour  Intake 956 ml  Output 2275 ml  Net -1319 ml        Physical Exam: Vital Signs Blood pressure 138/77, pulse 77, temperature 98.1 F (36.7 C), temperature source Oral, resp. rate 18, height 5\' 8"  (1.727 m), weight 94.1 kg, SpO2 99 %.    General: No acute distress Mood and affect are appropriate Heart: Regular rate and rhythm no rubs murmurs or extra sounds, mild perincisional tendernessLungs: Clear to auscultation, breathing unlabored, no rales or wheezes-coughs with deep breath  Abdomen: Positive bowel sounds, soft nontender to palpation, nondistended Extremities: No clubbing, cyanosis, or edema Skin: No evidence of breakdown, no evidence of rash Neurologic: Cranial nerves II through XII intact, motor strength is 5/5 in bilateral deltoid, bicep, tricep, grip, hip flexor, knee extensors, ankle dorsiflexor and plantar flexor   Musculoskeletal: Full range of motion in all 4 extremities. No joint swelling    Assessment/Plan: 1. Functional deficits which require 3+ hours per day of interdisciplinary therapy in a comprehensive inpatient rehab setting.  Physiatrist is providing close team supervision and 24 hour management of active medical problems listed below.  Physiatrist and rehab team continue to assess barriers to discharge/monitor patient progress toward functional and medical goals  Care Tool:  Bathing    Body parts bathed by patient: Right arm,Left arm,Chest,Abdomen,Front  perineal area,Right upper leg,Left upper leg,Face,Right lower leg,Left lower leg   Body parts bathed by helper: Buttocks     Bathing assist Assist Level: Minimal Assistance - Patient > 75%     Upper Body Dressing/Undressing Upper body dressing   What is the patient wearing?: Button up shirt    Upper body assist Assist Level: Supervision/Verbal cueing    Lower Body Dressing/Undressing Lower body dressing      What is the patient wearing?: Incontinence brief,Pants     Lower body assist Assist for lower body dressing: Supervision/Verbal cueing     Toileting Toileting    Toileting assist Assist for toileting: Minimal Assistance - Patient > 75%     Transfers Chair/bed transfer  Transfers assist     Chair/bed transfer assist level: Supervision/Verbal cueing     Locomotion Ambulation   Ambulation assist      Assist level: Supervision/Verbal cueing Assistive device: Walker-rolling Max distance: ~300   Walk 10 feet activity   Assist     Assist level: Contact Guard/Touching assist Assistive device: Walker-rolling   Walk 50 feet activity   Assist    Assist level: Contact Guard/Touching assist Assistive device: Walker-rolling    Walk 150 feet activity   Assist    Assist level: Contact Guard/Touching assist Assistive  device: Walker-rolling    Walk 10 feet on uneven surface  activity   Assist Walk 10 feet on uneven surfaces activity did not occur: Safety/medical concerns         Wheelchair     Assist Will patient use wheelchair at discharge?: No             Wheelchair 50 feet with 2 turns activity    Assist            Wheelchair 150 feet activity     Assist          Blood pressure 138/77, pulse 77, temperature 98.1 F (36.7 C), temperature source Oral, resp. rate 18, height 5\' 8"  (1.727 m), weight 94.1 kg, SpO2 99 %.  Medical Problem List and Plan: 1.Functional and mobility deficitssecondary to left  cerebellar infarcts after CABG -patient may shower -ELOS/Goals: , supervision to mod I with PT, OT, SLP 2/23  Continue CIR 2. Antithrombotics: -DVT/anticoagulation:Pharmaceutical:Continue Lovenox--added, add foot pumps to help with foot swelling.Improved -antiplatelet therapy: Continue Plavix/ASA 3. Pain Management:ContinueOxycodone prn severe and tramadol prn moderate pain 4. Mood:LCSW to follow for evaluation and support. -antipsychotic agents: N/A 5. Neuropsych: This patientiscapable of making decisions on hisown behalf. 6. Skin/Wound Care:routine pressure relief measures.  --Monitor incisions for healing  7. Fluids/Electrolytes/Nutrition:Monitor I/O. Check lytes on Monday 8. CAD s/p CABG: Continue sternal precautions --monitor for symptoms with increased activity --On Lasix 40mg IV bid--> change to po based on weight/diuresis. Pt still a few pounds over pre-op weight --lopressorincreased to 25mg bid 2/10to help manage tachycardia -will add ACE wraps to LE to assist with LE edema. 9. HTN: Monitor BP tid-- Lasix,lopressor Vitals:   11/03/20 0347 11/03/20 0743  BP: 138/77   Pulse: 77   Resp: 18   Temp: 98.1 F (36.7 C)   SpO2: 100% 99%  controlled on lower dose metoprolol  10. H/o DOE/Hypoxia: Encourage pulmonary hygiene --wean oxygen as able.  11. Pre-renal azotemia: Encourage fluids but monitor for signs of overaload. 12. Dysphagia: Continue DII, honey liquids --pt has a deviated septum, tube very uncomfortable -he's eating well (100% breakfast)---> dc NGT -push honeys. No IVF at this point given above Pt appears anxious about swallowing some throat irritation will rx chlorasept and ask SLP to see , ok for po and meds in applesauce  13. ABLA: Recheck hemoglobin dropped from 10.3-9.3 since last week however  BUN corrected from 32to13 so this is likely dilutional. 14. Stress induced hyperglycemia: Hgb A1c-5.6.   2/20: has been hypoglyemic after receiving SSI- CBGs reviewed and well controlled- d/ced SSI and CBG checks 15. COPD: Used Symbicort prn at home--now on Breo Elliptica  16. Diarrhea improved off colchicine  17.  Xerostomia-improved  likely multifactorial- saline nasal spray, d/c trazodone, may use restoril  Since no anticholinergic effect, humidified O2- doing better with this  LOS: 9 days A FACE TO FACE EVALUATION WAS PERFORMED  Charlett Blake 11/03/2020, 8:23 AM

## 2020-11-03 NOTE — Progress Notes (Signed)
Physical Therapy Discharge Summary  Patient Details  Name: Jonathan [name] MRN: [id number] Date of Birth: 1950-09-29  Today's Date: 11/04/2020 PT Individual Time: 0901-1012 PT Individual Time Calculation (min):  71 min   Patient has met 9 of 9 long term goals due to improved activity tolerance, improved balance, improved safety awareness and increased strength.  Patient to discharge at an ambulatory level Independent.   Patient's care partner is independent to provide the necessary supervision assistance at discharge.  Reasons goals not met: n/a  Recommendation:  Patient will benefit from ongoing skilled PT services in home health setting to continue to advance safe functional mobility, address ongoing impairments in activity tolerance, general strength, standing balance, safety awareness, and minimize fall risk.  Equipment: RW  Reasons for discharge: treatment goals met and discharge from hospital  Patient/family agrees with progress made and goals achieved: Yes    PT Discharge Precautions/Restrictions Precautions Precautions: Fall;Sternal Restrictions Weight Bearing Restrictions: No Pain Pain Assessment Pain Scale: 0-10 Pain Score: 0-No pain Vision/Perception    No change from baseline; wears glasses Cognition Overall Cognitive Status: Within Functional Limits for tasks assessed Orientation Level: Oriented X4 Safety/Judgment: Appears intact Motor  Motor Motor: Within Functional Limits Motor - Discharge Observations: slow 2/2 generalized weakness compared to baseline, but overall greatly improved from eval, ind/min VCs with sternal precautions  Mobility Bed Mobility Rolling Right: Independent Rolling Left: Independent Supine to Sit: Independent with assistive device Sit to Supine: Independent with assistive device Transfers Transfers: Sit to Stand;Stand to Sit;Stand Pivot Transfers Sit to Stand: Independent Stand to Sit: Independent Stand Pivot Transfers:  Independent Transfer (Assistive device): None Locomotion  Gait Ambulation: Yes Gait Assistance: Independent Gait Distance (Feet): 400 Feet Assistive device: None Gait Gait: Yes Gait Pattern: Impaired Gait Pattern: Step-through pattern;Decreased step length - right;Decreased step length - left;Decreased hip/knee flexion - right;Decreased hip/knee flexion - left;Decreased dorsiflexion - right;Decreased dorsiflexion - left;Wide base of support Gait velocity: reduced from baseline and for age 39 / Additional Locomotion Stairs: Yes Stairs Assistance: Supervision/Verbal cueing Stair Management Technique: No rails;Alternating pattern;Step to pattern Number of Stairs: 4 Height of Stairs: 6 Ramp: Independent Curb: Independent Wheelchair Mobility Wheelchair Mobility: No  Trunk/Postural Assessment  Cervical Assessment Cervical Assessment: Exceptions to [facility] (forward head) Thoracic Assessment Thoracic Assessment:  (rounded shoulders) Lumbar Assessment Lumbar Assessment: Exceptions to [facility] (posterior pelvic tilt) Postural Control Postural Control: Within Functional Limits  Balance Balance Balance Assessed: Yes Standardized Balance Assessment Standardized Balance Assessment: Berg Balance Test Berg Balance Test Sit to Stand: Able to stand without using hands and stabilize independently Standing Unsupported: Able to stand safely 2 minutes Sitting with Back Unsupported but Feet Supported on Floor or Stool: Able to sit safely and securely 2 minutes Stand to Sit: Sits safely with minimal use of hands Transfers: Able to transfer safely, minor use of hands Standing Unsupported with Eyes Closed: Able to stand 10 seconds safely Standing Ubsupported with Feet Together: Able to place feet together independently and stand 1 minute safely From Standing, Reach Forward with Outstretched Arm: Can reach forward >12 cm safely (5") From Standing Position, Pick up Object from Floor: Able to pick up  shoe safely and easily From Standing Position, Turn to Look Behind Over each Shoulder: Looks behind from both sides and weight shifts well Turn 360 Degrees: Able to turn 360 degrees safely in 4 seconds or less Standing Unsupported, Alternately Place Feet on Step/Stool: Able to stand independently and safely and complete 8 steps in 20 seconds Standing Unsupported,  One Foot in Front: Able to plae foot ahead of the other independently and hold 30 seconds Standing on One Leg: Able to lift leg independently and hold equal to or more than 3 seconds Total Score: 52 Timed Up and Go Test TUG: Normal TUG Normal TUG (seconds): 14.6 Static Sitting Balance Static Sitting - Balance Support: Feet supported Static Sitting - Level of Assistance: 7: Independent Dynamic Sitting Balance Dynamic Sitting - Balance Support: Feet supported;During functional activity Dynamic Sitting - Level of Assistance: 7: Independent Static Standing Balance Static Standing - Balance Support: During functional activity Static Standing - Level of Assistance: 7: Independent Static Stance: Eyes Closed: slight increase in sway initially with fast accommodation Static Stance: on Foam: slight increase in sway initially with fast accommodation and finds steadiness quickly - good ankle strategy Dynamic Standing Balance Dynamic Standing - Balance Support: During functional activity;No upper extremity supported Dynamic Standing - Level of Assistance: 5: Stand by assistance Dynamic Standing - Balance Activities: Lateral lean/weight shifting;Forward lean/weight shifting;[place];Reaching for objects;Reaching for weighted objects;Reaching across midline;Kicking ball;Compliant surfaces;Foam balance beam Extremity Assessment      RLE Assessment RLE Assessment: Within Functional Limits RLE Strength RLE Overall Strength Comments: Grossly 4+/ 5 LLE Assessment LLE Assessment: Within Functional Limits LLE Strength LLE Overall Strength:  Within Functional Limits for tasks assessed LLE Overall Strength Comments: Grossly 4+/ 5  Skilled Therapeutic Interventions/Progress Updates:  Patient seated in recliner upon PT arrival. Patient alert and agreeable to PT session. Patient denied pain during session. Continued spells during session with coughing bouts on hard efforts.   Therapeutic Activity: All functional transfers performed with Independence requiring no UE support or vc to complete. Car transfer performed with supervision/ Mod I and requires UE assist for LE to clear doorway. Toilet transfers performed with Independence.   Gait Training:  Patient ambulated > 400'  with no AD and conserves energy with slow pace in order to reach further distances. Pt demos learning from all therapy re: energy conservation techniques. 10MWT completed in 38.67seconds with a gait speed of 0.50mters/sec.  Neuromuscular Re-ed: BMerrilee JanskyBalance completed with new score of 52/ 56 indicating a low fall risk and [facility]  NMR performed for improvements in motor control and coordination, balance, sequencing, judgement, and self confidence/ efficacy in performing all aspects of mobility at highest level of independence.   Patient seated in recliner at end of session with brakes locked, no alarm set and all needs within reach. RN and NT notified that pt upgraded to Mod I/ Ind in room prior to d/c.    JAlger Simons2/22/2022, 12:51 PM

## 2020-11-03 NOTE — Progress Notes (Signed)
Occupational Therapy Discharge Summary  Patient Details  Name: Jonathan [name] MRN: [id number] Date of Birth: 1950/12/03  Today's Date: 11/03/2020 OT Individual Time: 0700-0753 OT Individual Time Calculation (min): 53 min    Patient has met 10 of 10 long term goals due to improved activity tolerance, improved balance, postural control, ability to compensate for deficits and improved coordination.  Patient to discharge at overall Modified Independent level.  Patient's care partner is independent to provide the necessary physical assistance at discharge.    Reasons goals not met: NA  Recommendation:  Patient will benefit from ongoing skilled OT services in home health setting to continue to advance functional skills in the area of BADL and iADL.  Equipment: No equipment provided  Reasons for discharge: treatment goals met and discharge from hospital  Patient/family agrees with progress made and goals achieved: Yes  OT Discharge  Precautions/Restrictions  Precautions Precautions: Fall;Sternal Restrictions RUE Weight Bearing: Weight bearing as tolerated LLE Weight Bearing: Weight bearing as tolerated Pain Pain Assessment Pain Scale: 0-10 Pain Score: 0-No pain ADL ADL Equipment Provided: Reacher,Long-handled sponge Eating: Independent Where Assessed-Eating: Chair Grooming: Modified independent Where Assessed-Grooming: Standing at sink,Chair Upper Body Bathing: Supervision/safety Where Assessed-Upper Body Bathing: Shower Lower Body Bathing: Supervision/safety Where Assessed-Lower Body Bathing: Shower Upper Body Dressing: Supervision/safety Where Assessed-Upper Body Dressing: Edge of bed Lower Body Dressing: Supervision/safety Where Assessed-Lower Body Dressing: Edge of bed,Chair Toileting: Modified independent Where Assessed-Toileting: Glass blower/designer: Diplomatic Services operational officer Method: Counselling psychologist: Pharmacist, hospital: Distant supervision Tub/Shower Transfer Method: Optometrist: Civil engineer, contracting with back Social research officer, government: Distant supervision Social research officer, government Method: Heritage manager: Transfer tub bench,Grab bars Vision Baseline Vision/History: Wears glasses Wears Glasses: At all times Patient Visual Report: No change from baseline Vision Assessment?: No apparent visual deficits Perception  Perception: Within Functional Limits Praxis Praxis: Intact Cognition Overall Cognitive Status: Within Functional Limits for tasks assessed Arousal/Alertness: Awake/alert Orientation Level: Oriented X4 Attention: Selective Selective Attention: Appears intact Memory: Appears intact Safety/Judgment: Appears intact Sensation Sensation Light Touch: Appears Intact Hot/Cold: Appears Intact Proprioception: Appears Intact Stereognosis: Appears Intact Coordination Gross Motor Movements are Fluid and Coordinated: Yes Fine Motor Movements are Fluid and Coordinated: Yes Motor  Motor Motor: Within Functional Limits Motor - Discharge Observations: slow 2/2 generalized weakness compared to baseline, but overall greatly improved from eval, ind/min VCs with sternal precautions Mobility  Bed Mobility Bed Mobility: Supine to Sit;Sit to Supine;Rolling Right;Rolling Left Rolling Right: Independent Rolling Left: Independent Supine to Sit: Independent with assistive device Sit to Supine: Independent with assistive device Transfers Sit to Stand: Independent with assistive device Stand to Sit: Independent with assistive device  Trunk/Postural Assessment  Cervical Assessment Cervical Assessment: Exceptions to [facility] (forward head) Thoracic Assessment Thoracic Assessment: Exceptions to [facility] (rounded shoulders) Lumbar Assessment Lumbar Assessment: Exceptions to [facility] (posterior pelvic tilt) Postural Control Postural Control: Within Functional Limits   Balance Balance Balance Assessed: Yes Static Sitting Balance Static Sitting - Balance Support: Feet supported Static Sitting - Level of Assistance: 7: Independent Dynamic Sitting Balance Dynamic Sitting - Balance Support: Feet supported;Bilateral upper extremity supported Dynamic Sitting - Level of Assistance: 7: Independent Static Standing Balance Static Standing - Balance Support: During functional activity;Bilateral upper extremity supported Static Standing - Level of Assistance: 6: Modified independent (Device/Increase time) Dynamic Standing Balance Dynamic Standing - Balance Support: During functional activity;Bilateral upper extremity supported Dynamic Standing - Level of Assistance: 6: Modified independent (Device/Increase time) Extremity/Trunk Assessment RUE  Assessment RUE Assessment: Within Functional Limits General Strength Comments: intact to 90 - no OH reach due to sternal precautions LUE Assessment LUE Assessment: Within Functional Limits General Strength Comments: intact to 90 - no OH reach due to sternal precautions  Session Note: Pt received semi-reclined in bed, agreeable to therapy. Session focus on toileting, showering, dressing, and grooming with DC reassessments redone as documented above. Supine > sitting EOB with mod I, STS and amb to bathroom to complete toilet transfer with mod I + RW. Cont void of b/b, mod I for hygeine and LB clothing management. TTB transfer with Rw + bathed full body with close S, overall good adherence to sternal precautions. Doffed/donned shirt with supervision after visual demonstration, LB dressing/socks with supervision, and stood to groom at sink with mod I. Applied ACE wraps to BLE to assist in decreasing BLE edema, total A to redon socks. Pt with no further additional questions for OT at this time. Pt left in recliner with RN present with chair alarm engaged, call bell in reach, and all immediate needs met.   Volanda Napoleon MS,  OTR/L  11/03/2020, 7:31 AM

## 2020-11-03 NOTE — Plan of Care (Signed)
  Problem: RH Balance Goal: LTG Patient will maintain dynamic sitting balance (PT) Description: LTG:  Patient will maintain dynamic sitting balance with assistance during mobility activities (PT) Outcome: Completed/Met Flowsheets (Taken 11/03/2020 1245) LTG: Pt will maintain dynamic sitting balance during mobility activities with:: Independent Goal: LTG Patient will maintain dynamic standing balance (PT) Description: LTG:  Patient will maintain dynamic standing balance with assistance during mobility activities (PT) Outcome: Completed/Met Flowsheets (Taken 11/03/2020 1245) LTG: Pt will maintain dynamic standing balance during mobility activities with:: Independent   Problem: Sit to Stand Goal: LTG:  Patient will perform sit to stand with assistance level (PT) Description: LTG:  Patient will perform sit to stand with assistance level (PT) Outcome: Completed/Met Flowsheets (Taken 11/03/2020 1245) LTG: PT will perform sit to stand in preparation for functional mobility with assistance level: Independent   Problem: RH Bed Mobility Goal: LTG Patient will perform bed mobility with assist (PT) Description: LTG: Patient will perform bed mobility with assistance, with/without cues (PT). Outcome: Completed/Met Flowsheets (Taken 11/03/2020 1245) LTG: Pt will perform bed mobility with assistance level of: Independent   Problem: RH Bed to Chair Transfers Goal: LTG Patient will perform bed/chair transfers w/assist (PT) Description: LTG: Patient will perform bed to chair transfers with assistance (PT). Outcome: Completed/Met Flowsheets (Taken 11/03/2020 1245) LTG: Pt will perform Bed to Chair Transfers with assistance level: Independent   Problem: RH Car Transfers Goal: LTG Patient will perform car transfers with assist (PT) Description: LTG: Patient will perform car transfers with assistance (PT). Outcome: Completed/Met Flowsheets (Taken 11/03/2020 1245) LTG: Pt will perform car transfers with  assist:: Supervision/Verbal cueing   Problem: RH Ambulation Goal: LTG Patient will ambulate in controlled environment (PT) Description: LTG: Patient will ambulate in a controlled environment, # of feet with assistance (PT). Outcome: Completed/Met Flowsheets Taken 11/03/2020 1245 LTG: Pt will ambulate in controlled environ  assist needed:: Independent Taken 10/27/2020 1607 LTG: Ambulation distance in controlled environment: 25 feet using LRAD Goal: LTG Patient will ambulate in home environment (PT) Description: LTG: Patient will ambulate in home environment, # of feet with assistance (PT). Outcome: Completed/Met Flowsheets Taken 11/03/2020 1245 LTG: Pt will ambulate in home environ  assist needed:: Independent Taken 10/27/2020 1607 LTG: Ambulation distance in home environment: at least 75 ft using LRAD Note: Reaching up to 400 feet with no AD and slow pace   Problem: RH Stairs Goal: LTG Patient will ambulate up and down stairs w/assist (PT) Description: LTG: Patient will ambulate up and down # of stairs with assistance (PT) Outcome: Completed/Met Flowsheets (Taken 11/03/2020 1245) LTG: Pt will ambulate up/down stairs assist needed:: Independent LTG: Pt will  ambulate up and down number of stairs: four 6" steps using no HR

## 2020-11-04 DIAGNOSIS — I639 Cerebral infarction, unspecified: Secondary | ICD-10-CM | POA: Diagnosis not present

## 2020-11-04 MED ORDER — SALINE SPRAY 0.65 % NA SOLN: 1.0000 | Freq: Two times a day (BID) | NASAL | 0 refills | Status: AC

## 2020-11-04 MED ORDER — ACETAMINOPHEN 325 MG PO TABS: 325.0000 mg | ORAL_TABLET | ORAL | Status: DC | PRN

## 2020-11-04 MED ORDER — METOPROLOL TARTRATE 25 MG PO TABS: 12.5000 mg | ORAL_TABLET | Freq: Two times a day (BID) | ORAL | 0 refills | Status: DC

## 2020-11-04 MED ORDER — BISACODYL 5 MG PO TBEC: 10.0000 mg | DELAYED_RELEASE_TABLET | Freq: Every day | ORAL | 0 refills | Status: DC

## 2020-11-04 MED ORDER — POTASSIUM CHLORIDE 20 MEQ PO PACK
20.0000 meq | PACK | Freq: Two times a day (BID) | ORAL | 0 refills | Status: DC
Start: 2020-11-04 — End: 2020-11-16

## 2020-11-04 MED ORDER — CLOPIDOGREL BISULFATE 75 MG PO TABS: 75.0000 mg | ORAL_TABLET | Freq: Every day | ORAL | 0 refills | Status: DC

## 2020-11-04 MED ORDER — NICOTINE 21 MG/24HR TD PT24: 21.0000 mg | MEDICATED_PATCH | Freq: Every day | TRANSDERMAL | 0 refills | Status: DC

## 2020-11-04 MED ORDER — GUAIFENESIN 200 MG PO TABS: 200.0000 mg | ORAL_TABLET | Freq: Four times a day (QID) | ORAL | 0 refills | Status: DC | PRN

## 2020-11-04 MED ORDER — FUROSEMIDE 40 MG PO TABS: 40.0000 mg | ORAL_TABLET | Freq: Two times a day (BID) | ORAL | 0 refills | Status: DC

## 2020-11-04 MED ORDER — ASPIRIN 81 MG PO CHEW: 81.0000 mg | CHEWABLE_TABLET | Freq: Every day | ORAL | Status: AC

## 2020-11-04 NOTE — Progress Notes (Signed)
PROGRESS NOTE   Subjective/Complaints:    ROS- denies CP, SOB, N/V/D, pain  Objective:   No results found. Recent Labs    11/02/20 0434  WBC 7.8  HGB 9.3*  HCT 29.9*  PLT 533*   Recent Labs    11/02/20 0434  NA 135  K 3.9  CL 98  CO2 28  GLUCOSE 96  BUN 13  CREATININE 1.17  CALCIUM 8.3*    Intake/Output Summary (Last 24 hours) at 11/04/2020 0859 Last data filed at 11/04/2020 2641 Gross per 24 hour  Intake 254 ml  Output 400 ml  Net -146 ml        Physical Exam: Vital Signs Blood pressure 123/69, pulse 73, temperature 98.4 F (36.9 C), temperature source Oral, resp. rate 18, height 5\' 8"  (1.727 m), weight 94.6 kg, SpO2 96 %.     General: No acute distress Mood and affect are appropriate Heart: Regular rate and rhythm no rubs murmurs or extra sounds Lungs: Clear to auscultation, breathing unlabored, no rales or wheezes Abdomen: Positive bowel sounds, soft nontender to palpation, nondistended Extremities: No clubbing, cyanosis, or edema Skin: No evidence of breakdown, no evidence of rash Neurologic: Cranial nerves II through XII intact, motor strength is 5/5 in bilateral deltoid, bicep, tricep, grip, hip flexor, knee extensors, ankle dorsiflexor and plantar flexor Sensory exam normal sensation to light touch and proprioception in bilateral upper and lower extremities Cerebellar exam normal finger to nose to finger as well as heel to shin in bilateral upper and lower extremities Musculoskeletal: Full range of motion in all 4 extremities. No joint swelling    Musculoskeletal: Full range of motion in all 4 extremities. No joint swelling    Assessment/Plan: 1. Functional deficits Stable for D/C today F/u PCP in 3-4 weeks F/u CVTS 2 weeks See D/C summary See D/C instructions  Care Tool:  Bathing    Body parts bathed by patient: Right arm,Left arm,Chest,Abdomen,Front perineal area,Right  upper leg,Left upper leg,Face,Right lower leg,Left lower leg,Buttocks   Body parts bathed by helper: Buttocks     Bathing assist Assist Level: Supervision/Verbal cueing     Upper Body Dressing/Undressing Upper body dressing   What is the patient wearing?: Button up shirt,Pull over shirt    Upper body assist Assist Level: Supervision/Verbal cueing    Lower Body Dressing/Undressing Lower body dressing      What is the patient wearing?: Incontinence brief,Pants     Lower body assist Assist for lower body dressing: Supervision/Verbal cueing     Toileting Toileting    Toileting assist Assist for toileting: Independent with assistive device     Transfers Chair/bed transfer  Transfers assist     Chair/bed transfer assist level: Independent     Locomotion Ambulation   Ambulation assist      Assist level: Independent Assistive device: No Device Max distance: 400   Walk 10 feet activity   Assist     Assist level: Independent Assistive device: No Device   Walk 50 feet activity   Assist    Assist level: Independent Assistive device: No Device    Walk 150 feet activity   Assist    Assist level: Independent  Assistive device: No Device    Walk 10 feet on uneven surface  activity   Assist Walk 10 feet on uneven surfaces activity did not occur: Safety/medical concerns   Assist level: Independent Assistive device: Other (comment) (no device)   Wheelchair     Assist Will patient use wheelchair at discharge?: No             Wheelchair 50 feet with 2 turns activity    Assist            Wheelchair 150 feet activity     Assist          Blood pressure 123/69, pulse 73, temperature 98.4 F (36.9 C), temperature source Oral, resp. rate 18, height 5\' 8"  (1.727 m), weight 94.6 kg, SpO2 96 %.  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 Antithrombotics: -DVT/anticoagulation:Pharmaceutical:Continue Lovenox--added, add foot pumps to help with foot swelling.Improved -antiplatelet therapy: Continue Plavix/ASA 3. Pain Management:ContinueOxycodone prn severe and tramadol prn moderate pain 4. Mood:LCSW to follow for evaluation and support. -antipsychotic agents: N/A 5. Neuropsych: This patientiscapable of making decisions on hisown behalf. 6. Skin/Wound Care:routine pressure relief measures.  --Monitor incisions for healing  7. Fluids/Electrolytes/Nutrition:Monitor I/O. Check lytes on Monday 8. CAD s/p CABG: Continue sternal precautions --monitor for symptoms with increased activity --On Lasix 40mg IV bid--> change to po based on weight/diuresis. Pt still a few pounds over pre-op weight --lopressorincreased to 25mg bid 2/10to help manage tachycardia -will add ACE wraps to LE to assist with LE edema. 9. HTN: Monitor BP tid-- Lasix,lopressor Vitals:   11/03/20 1942 11/04/20 0447  BP: 116/77 123/69  Pulse: 87 73  Resp: 18 18  Temp: 98.6 F (37 C) 98.4 F (36.9 C)  SpO2: 92% 96%  controlled on lower dose metoprolol  10. H/o DOE/Hypoxia: Encourage pulmonary hygiene --wean oxygen as able.  11. Pre-renal azotemia: Encourage fluids but monitor for signs of overaload. 12. Dysphagia: Continue DII, honey liquids --pt has a deviated septum, tube very uncomfortable -he's eating well (100% breakfast)---> dc NGT -push honeys. No IVF at this point given above Pt appears anxious about swallowing some throat irritation will rx chlorasept and ask SLP to see , ok for po and meds in applesauce  13. ABLA: Recheck hemoglobin dropped from 10.3-9.3 since last week however  BUN corrected from 32to13 so this is likely dilutional. 14. Stress induced hyperglycemia: Hgb A1c-5.6.   2/20: has been hypoglyemic after receiving SSI- CBGs reviewed and well controlled- d/ced SSI and CBG checks 15. COPD: Used Symbicort prn at home--now on Breo Elliptica  16. Diarrhea improved off colchicine  17.  Xerostomia-improved  likely multifactorial- saline nasal spray, d/c trazodone, may use restoril  Since no anticholinergic effect, humidified O2- doing better with this  LOS: 10 days A FACE TO FACE EVALUATION WAS PERFORMED  Charlett Blake 11/04/2020, 8:59 AM

## 2020-11-04 NOTE — Discharge Summary (Signed)
Physician Discharge Summary  Patient ID: Jonathan [name] MRN: [id number] DOB/AGE: [birth year] 70 y.o.  Admit date: 10/25/2020 Discharge date: 11/04/2020  Discharge Diagnoses:  Principal Problem:   Cerebellar stroke [facility])   Discharged Condition: stable   Significant Diagnostic Studies: DG Swallowing Func-Speech Pathology  Result Date: 10/29/2020 Objective Swallowing Evaluation: Type of Study: MBS-Modified Barium Swallow Study  Patient Details Name: Jonathan [name] MRN: [id number] Date of Birth: 29-May-1951 Today's Date: 10/29/2020 Past Medical History: Past Medical History: Diagnosis Date . Arthritis   hands  . Chronic bronchitis ([place])  . Chronic sinusitis of both maxillary sinuses 04/16/2018  Sinus CT 04/25/2018 Mild mucoperiosteal thickening involving the frontoethmoidal sinuses. Otherwise largely clear sinuses with no evidence for acute sinusitis.  [name] COPD GOLD II   active smoker 04/16/2018  Spirometry 04/16/2018  FEV1 1.43 (47%)  Ratio 62  s prior rx  - 04/16/2018    try symbicort 160 2bid - PFT's  06/08/2018  FEV1 2.03 (71 % ) ratio 61  p 5 % improvement from saba p nothing prior to study with DLCO  63 % corrects to 72  % for alv volume    -  06/08/2018  After extensive coaching inhaler device,  effectiveness =    75%  (short ti/ variable flow) > continue symbicort 160 2bid  . Coronary artery disease  . Coronary artery disease involving native coronary artery of native heart with unstable angina pectoris ([place])  . DOE (dyspnea on exertion) 04/16/2018 . Essential hypertension, benign 09/23/2012  Changed acei to arb 04/16/2018 due to pseudowheeze > improved 06/08/2018  . Hernia, inguinal 09/23/2012 . Hyperlipidemia   has not tolerated statins,  would not pursue PSK9 inhibitor as advised by Dr Claiborne Billings . Hypertension  . Myocardial infarction ([place])  . NSTEMI (non-ST elevated myocardial infarction) ([place]) 09/23/2012 . 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 Tobacco abuse  Past Surgical History: Past Surgical History: Procedure  Laterality Date . CARDIAC CATHETERIZATION   . CARDIOVASCULAR STRESS TEST  11/11/2011 . CORONARY ANGIOPLASTY    stents- 01/2004  . CORONARY ARTERY BYPASS GRAFT N/A 10/15/2020  Procedure: CORONARY ARTERY BYPASS GRAFTING (CABG) TIMES FOUR USING BILATERAL INTERNAL MAMMARY ARTERIES AND ENDOSCOPICALLY HARVESTED GREATER SAPHENOUS VEIN AND INSERTION OF 50CC INTRAAORTIC BALLOON PUMP;  Surgeon: [name], MD;  Location: [place];  Service: Open Heart Surgery;  Laterality: N/A;  possible BIMA . ENDOVEIN HARVEST OF GREATER SAPHENOUS VEIN Right 10/15/2020  Procedure: ENDOVEIN HARVEST OF GREATER SAPHENOUS VEIN;  Surgeon: [name], MD;  Location: [place];  Service: Open Heart Surgery;  Laterality: Right; . heart stents  01/2004 . INGUINAL HERNIA REPAIR Right 12/06/2012  Procedure: HERNIA REPAIR INGUINAL ADULT;  Surgeon: [name], DO;  Location: [facility];  Service: General;  Laterality: Right; . INSERTION OF MESH Right 12/06/2012  Procedure: INSERTION OF MESH;  Surgeon: [name], DO;  Location: [facility];  Service: General;  Laterality: Right; . LEFT HEART CATH AND CORONARY ANGIOGRAPHY N/A 07/22/2019  Procedure: LEFT HEART CATH AND CORONARY ANGIOGRAPHY;  Surgeon: [name], MD;  Location: [place] [facility];  Service: Cardiovascular;  Laterality: N/A; . LEFT HEART CATH AND CORONARY ANGIOGRAPHY N/A 10/14/2020  Procedure: LEFT HEART CATH AND CORONARY ANGIOGRAPHY;  Surgeon: [name], MD;  Location: [place] [facility];  Service: Cardiovascular;  Laterality: N/A; . TEE WITHOUT CARDIOVERSION N/A 10/15/2020  Procedure: TRANSESOPHAGEAL ECHOCARDIOGRAM (TEE);  Surgeon: [name], MD;  Location: [place];  Service: Open Heart Surgery;  Laterality: N/A; HPI: Pt is  a 70 y.o. male presenting with coronary artery disease. Pt recieved coronary artery bypass grafting times four on 10/16/20. CXR from 10/14/20 showed bilateral pulmonary nodules and Head CT from 10/19/20 revealed small age-indeterminate infarct in the left posterior  cerebellar hemisphere (acute to subacute). Pt intubated from 10/15/20 to 10/17/20. PMH includes: COPD, hyperlipidemia, HTN, DOE, chronic bronchitis, chronic sinusitis, myocardial infarction. Pt admitted to [facility] 10/25/20.  Subjective: Pt alert and cooperative Assessment / Plan / Recommendation CHL IP CLINICAL IMPRESSIONS 10/29/2020 Clinical Impression --Pt presents with very mild oral and pharyngeal phase swallowing deficits, but excellent functional improvement in ability to protect his airway with thin liquids today. Although pt exhibits consistently reduced anterior hyolaryngeal excursion during the swallow, only one instance of flash penetration of thin barium observed on the first trial that was presented -- material began to enter the laryngeal vestibule prior to the swallow due to delayed epiglottic inversion, but remained above the vocal folds and 100% of penetrate was ejected from vestibule during the swallow. No aspiration was observed regardless of bolus size or method of delivery (cup VS straw). Pt did demonstrate difficulty coordinating dual consistency barium pill with thin liquid due to reduced posterior propulsion and lingual pumping of pill and as a result, decreased bolus cohesion. SLP would recommend pt continue dysphagia 3 solid textures, upgrade to thin liquids, continue medications crushed or whole in applesauce (depending on pt's preference). Pt should have intermittent supervision during meals to ensure use of safe swallow strategies, particularly slow rate, given pt's decreased respiratory status. ST will continue to follow pt to ensure diet safety and efficiency while inpatient. SLP Visit Diagnosis Dysphagia, oropharyngeal phase (R13.12) Attention and concentration deficit following -- Frontal lobe and executive function deficit following -- Impact on safety and function Mild aspiration risk   CHL IP TREATMENT RECOMMENDATION 10/21/2020 Treatment Recommendations Therapy as outlined in treatment plan  below   Prognosis 10/21/2020 Prognosis for Safe Diet Advancement Good Barriers to Reach Goals Time post onset;Severity of deficits Barriers/Prognosis Comment -- CHL IP DIET RECOMMENDATION 10/29/2020 SLP Diet Recommendations Dysphagia 3 (Mech soft) solids;Thin liquid Liquid Administration via Cup;Straw Medication Administration Whole meds with puree Compensations Slow rate;Small sips/bites Postural Changes Remain semi-upright after after feeds/meals (Comment);Seated upright at 90 degrees   CHL IP OTHER RECOMMENDATIONS 10/29/2020 Recommended Consults -- Oral Care Recommendations Oral care BID Other Recommendations --   CHL IP FOLLOW UP RECOMMENDATIONS 10/22/2020 Follow up Recommendations Home health SLP   CHL IP FREQUENCY AND DURATION 10/21/2020 Speech Therapy Frequency (ACUTE ONLY) min 2x/week Treatment Duration 2 weeks      CHL IP ORAL PHASE 10/29/2020 Oral Phase Impaired Oral - Pudding Teaspoon -- Oral - Pudding Cup -- Oral - Honey Teaspoon -- Oral - Honey Cup NT Oral - Nectar Teaspoon -- Oral - Nectar Cup NT Oral - Nectar Straw NT Oral - Thin Teaspoon -- Oral - Thin Cup WFL Oral - Thin Straw WFL Oral - Puree NT Oral - Mech Soft -- Oral - Regular NT Oral - Multi-Consistency -- Oral - Pill Delayed oral transit;Decreased bolus cohesion;Lingual pumping;Reduced posterior propulsion Oral Phase - Comment --  CHL IP PHARYNGEAL PHASE 10/29/2020 Pharyngeal Phase Impaired Pharyngeal- Pudding Teaspoon -- Pharyngeal -- Pharyngeal- Pudding Cup -- Pharyngeal -- Pharyngeal- Honey Teaspoon -- Pharyngeal -- Pharyngeal- Honey Cup NT Pharyngeal -- Pharyngeal- Nectar Teaspoon -- Pharyngeal -- Pharyngeal- Nectar Cup NT Pharyngeal -- Pharyngeal- Nectar Straw NT Pharyngeal -- Pharyngeal- Thin Teaspoon -- Pharyngeal -- Pharyngeal- Thin Cup Reduced anterior laryngeal mobility;Penetration/Aspiration before swallow Pharyngeal  Material enters airway, remains ABOVE vocal cords then ejected out Pharyngeal- Thin Straw Reduced anterior laryngeal mobility  Pharyngeal Material does not enter airway Pharyngeal- Puree NT Pharyngeal -- Pharyngeal- Mechanical Soft -- Pharyngeal -- Pharyngeal- Regular NT Pharyngeal -- Pharyngeal- Multi-consistency -- Pharyngeal -- Pharyngeal- Pill -- Pharyngeal -- Pharyngeal Comment --  CHL IP CERVICAL ESOPHAGEAL PHASE 10/29/2020 Cervical Esophageal Phase WFL Pudding Teaspoon -- Pudding Cup -- Honey Teaspoon -- Honey Cup -- Nectar Teaspoon -- Nectar Cup -- Nectar Straw -- Thin Teaspoon -- Thin Cup -- Thin Straw -- Puree -- Mechanical Soft -- Regular -- Multi-consistency -- Pill -- Cervical Esophageal Comment -- Arbutus Leas 10/29/2020, 12:10 PM                Labs:  Basic Metabolic Panel: BMP Latest Ref Rng & Units 11/02/2020 10/26/2020 10/23/2020  Glucose 70 - 99 mg/dL 96 98 113(H)  BUN 8 - 23 mg/dL 13 32(H) 28(H)  Creatinine 0.61 - 1.24 mg/dL 1.17 1.06 0.90  BUN/Creat Ratio 10 - 24 - - -  Sodium 135 - 145 mmol/L 135 142 144  Potassium 3.5 - 5.1 mmol/L 3.9 4.3 4.2  Chloride 98 - 111 mmol/L 98 98 98  CO2 22 - 32 mmol/L 28 30 37(H)  Calcium 8.9 - 10.3 mg/dL 8.3(L) 8.6(L) 8.8(L)    CBC: CBC Latest Ref Rng & Units 11/02/2020 10/26/2020 10/22/2020  WBC 4.0 - 10.5 K/uL 7.8 11.5(H) 11.8(H)  Hemoglobin 13.0 - 17.0 g/dL 9.3(L) 10.3(L) 10.3(L)  Hematocrit 39.0 - 52.0 % 29.9(L) 32.6(L) 33.7(L)  Platelets 150 - 400 K/uL 533(H) 414(H) 230    CBG: Recent Labs  Lab 10/31/20 1130 10/31/20 1632 10/31/20 2111 11/01/20 0541 11/01/20 1127  GLUCAP 71 70 117* 87 81    Brief HPI:   Jonathan [name] is a 70 y.o. male with history of HTN, COPD, CAD who had refused CABG last year but continued to decline with progressive weakness, did DOE and BLE pain.  He was admitted on 10/14/2020 for cardiac cath revealing severe CAD with in-stent stenosis and underwent CABG x4 by Dr. Julien Girt on 02/03.  Postop had issues with volume overload requiring IV diuresis as well as decline in mental status with dysphagia.  CT of head done revealing  age-indeterminate left posterior cerebellar acute/subacute infarct and Dr. Rigoberto Noel question embolic versus cardio procedure related stroke.  Recommendations are for DAPT as well as 30-day cardiac monitor on outpatient basis to rule out arrhythmia as cause of stroke.  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  Emotional support was provided by staff with improvement in mentation and patient was agreeable to continue current interventions.  His respiratory status was improving and repeat MBS on 02/09 showed improvement in swallow function therefore he was started on dysphagia to honey liquids.  Therapy was ongoing and patient was noted to be limited by weakness, tachycardia, DOE as well as sternal precautions.  CIR was recommended due to functional decline.   Hospital Course: Jonathan [name] was admitted to rehab 10/25/2020 for inpatient therapies to consist of PT, ST and OT at least three hours five days a week. Past admission physiatrist, therapy team and rehab RN have worked together to provide customized collaborative inpatient rehab.  His sternal wound was clean, dry and intact and has been healing well.  IV Lasix was changed over to p.o. when he is tolerating this without signs  of fluid overload.  Lower extremity edema was managed with use of Ace wrap for compression.  His respiratory status has improved and he was weaned off oxygen.  Heart rate and blood pressures were monitored on TID basis and have been relatively stable on Lasix and Lopressor.  Symbicort was resumed and scheduled.  Blood sugars were monitored for 48 hours and stress-induced hyperglycemia was noted to have resolved therefore this was discontinued.  Swallow function has improved and he was advanced to regular diet.  His mood and outlook have greatly improved and he has progressed to modified independent level.   He will continue to receive home health PT and OT by [facility] after discharge.   Rehab course: During patient's stay in rehab weekly team conferences were held to monitor patient's progress, set goals and discuss barriers to discharge. At admission, patient required mod assist with ADLs and min assist with mobility.  He exhibited mild oropharyngeal dysphagia with hoarse voice but fluent speech and cognition was intact. He has had improvement in activity tolerance, balance, postural control as well as ability to compensate for deficits.  He is able to complete ADL tasks at modified independent level and requires distant supervision for shower transfers.  He is modified independent for transfers and to ambulate 400' without use of AD.  He is tolerating dysphagia 3, thin liquids with use of swallow strategies at modified independent level and no S/S of aspiration. Family education was completed regarding all aspects of care and safety.    Discharge disposition: 01-Home or Self Care  Diet: Heart healthy.   Special Instructions: 1. Recommend repeat CBC in one week to monitor H/H. 2. No driving. Continue sternal precautions.    Discharge Instructions    Ambulatory referral to Neurology   Complete by: As directed    An appointment is requested in approximately: 2-3 weeks     Allergies as of 11/04/2020      Reactions   Niaspan [niacin Er] Itching, Other (See Comments)   insomnia   Statins    LFT elevavation, CK elevevation      Medication List    STOP taking these medications   albuterol 108 (90 Base) MCG/ACT inhaler Commonly known as: VENTOLIN HFA   colchicine 0.6 MG tablet   feeding supplement (PROSource TF) liquid   feeding supplement (VITAL 1.5 CAL) Liqd   furosemide 10 MG/ML injection Commonly known as: LASIX Replaced by: furosemide 40 MG tablet   pantoprazole sodium 40 mg/20 mL Pack Commonly known as: PROTONIX   phenol 1.4 % Liqd Commonly known as:  CHLORASEPTIC   traMADol 50 MG tablet Commonly known as: ULTRAM     TAKE these medications   acetaminophen 325 MG tablet Commonly known as: TYLENOL Take 1-2 tablets (325-650 mg total) by mouth every 4 (four) hours as needed for mild pain.   aspirin 81 MG chewable tablet Chew 1 tablet (81 mg total) by mouth daily. What changed: how to take this Notes to patient: Purchase this over the counter   bisacodyl 5 MG EC tablet Commonly known as: DULCOLAX Take 2 tablets (10 mg total) by mouth daily. Can taper as needed Start taking on: November 05, 2020   budesonide-formoterol 160-4.5 MCG/ACT inhaler Commonly known as: Symbicort Inhale 2 puffs into the lungs 2 (two) times daily. What changed:   when to take this  reasons to take this   clopidogrel 75 MG tablet Commonly known as: PLAVIX Take 1 tablet (75 mg total) by mouth daily.  What changed: how to take this   furosemide 40 MG tablet Commonly known as: LASIX Take 1 tablet (40 mg total) by mouth 2 (two) times daily. Replaces: furosemide 10 MG/ML injection   guaiFENesin 200 MG tablet Take 1 tablet (200 mg total) by mouth every 6 (six) hours as needed for cough or to loosen phlegm. Notes to patient: You have been getting this four times a day here--change to as needed for cough. Purchase over the counter   metoprolol tartrate 25 MG tablet Commonly known as: LOPRESSOR Take 0.5 tablets (12.5 mg total) by mouth 2 (two) times daily. What changed:   how much to take  how to take this   nicotine 21 mg/24hr patch Commonly known as: NICODERM CQ - dosed in mg/24 hours Place 1 patch (21 mg total) onto the skin daily.   nitroGLYCERIN 0.4 MG SL tablet Commonly known as: NITROSTAT PLACE 1 TABLET UNDER THE TONGUE EVERY 5 MINUTES AS NEEDED FOR CHEST PAIN UP TO 3 DOSES, IF SYMPTOMS PERSIST CALL 911 What changed: See the new instructions.   potassium chloride 20 MEQ packet Commonly known as: KLOR-CON Take 20 mEq by mouth 2 (two)  times daily.   sodium chloride 0.65 % Soln nasal spray Commonly known as: OCEAN Place 1 spray into both nostrils 2 (two) times daily at 8 am and 10 pm. Notes to patient: For nasal congestion/nose bleeds       Follow-up Information    Kirsteins, Luanna Salk, MD Follow up.   Specialty: Physical Medicine and Rehabilitation Why: as needed Contact information: [place] [address] [phone]        Leonard Downing, MD. Call.   Specialty: Family Medicine Why: for post hospital follow up Contact information: [place] [address] [phone]        [name], MD. Call.   Specialty: Cardiology Why: for follow up appointment Contact information: [address] [place] [address] [phone]        [facility]. Call.   Why: for follow up appointment if you have not heard from them by Monday Contact information: [address]     [address] [place] [address] [phone]       [name], MD Follow up on 11/16/2020.   Specialty: Cardiothoracic Surgery Why: Follow up appointment at 10 am Contact information: [place] [address] [phone]               Signed: Bary Leriche 11/04/2020, 11:50 AM

## 2020-11-04 NOTE — Progress Notes (Signed)
Inpatient Rehabilitation Care Coordinator Discharge Note  The overall goal for the admission was met for:   Discharge location: Yes, home  Length of Stay: Yes, 10 Days  Discharge activity level: Yes, ambulatory level Independent  Home/community participation: Yes  Services provided included: MD, RD, PT, OT, SLP, RN, CM, TR, Pharmacy, Neuropsych and SW  Financial Services: Private Insurance: [employer] Medicare  Choices offered to/list presented to:Patient Spouse  Follow-up services arranged: Home Health: [facility]   Comments (or additional information): PT OT RW, 3N1  Patient/Family verbalized understanding of follow-up arrangements: Yes  Individual responsible for coordination of the follow-up plan: Karen, [phone]  Confirmed correct DME delivered: Christina J Baskerville 11/04/2020    Christina J Baskerville 

## 2020-11-04 NOTE — Discharge Instructions (Signed)
Inpatient Rehab Discharge Instructions  Jonathan [name] Discharge date and time: 11/04/20   Activities/Precautions/ Functional Status: Activity: no lifting, driving, or strenuous exercise till cleared by MD--continue sternal precautions.  Diet: cardiac diet Wound Care: Wash with soap and water. Pat dry and keep clean/dry.    Functional status:  ___ No restrictions     ___ Walk up steps independently ___ 24/7 supervision/assistance   ___ Walk up steps with assistance _X__ Intermittent supervision/assistance  ___ Bathe/dress independently ___ Walk with walker     ___ Bathe/dress with assistance ___ Walk Independently    ___ Shower independently ___ Walk with assistance    _X__ Shower with supervision  _X__ No alcohol     ___ Return to work/school ________   Special Instructions: 1. Purchase ASA, nicotine patch and dulcolax tabs over the counter.    COMMUNITY REFERRALS UPON DISCHARGE:    Home Health:   PT                     Agency: [facility]  Phone: [phone]    Medical Equipment/Items Ordered: 3N1, Rolling Walker                                                 Agency/Supplier: [employer]      My questions have been answered and I understand these instructions. I will adhere to these goals and the provided educational materials after my discharge from the hospital.  Patient/Caregiver Signature _______________________________ Date __________  Clinician Signature _______________________________________ Date __________  Please bring this form and your medication list with you to all your follow-up doctor's appointments.

## 2020-11-12 ENCOUNTER — Telehealth: Payer: Self-pay

## 2020-11-12 NOTE — Telephone Encounter (Signed)
Hospital discharge notes reviewed: Collier Salina Physical Therapist with [facility] given verbal permission for home visits. PT once a week for 5 weeks. To work on gait & endurance.

## 2020-11-13 ENCOUNTER — Other Ambulatory Visit: Payer: Self-pay | Admitting: Cardiothoracic Surgery

## 2020-11-13 DIAGNOSIS — Z951 Presence of aortocoronary bypass graft: Secondary | ICD-10-CM

## 2020-11-16 ENCOUNTER — Encounter: Payer: Self-pay | Admitting: Cardiothoracic Surgery

## 2020-11-16 ENCOUNTER — Ambulatory Visit (INDEPENDENT_AMBULATORY_CARE_PROVIDER_SITE_OTHER): Payer: Self-pay | Admitting: Cardiothoracic Surgery

## 2020-11-16 ENCOUNTER — Ambulatory Visit
Admission: RE | Admit: 2020-11-16 | Discharge: 2020-11-16 | Disposition: A | Discharge status: Home / Self Care | Payer: Medicare Other | Source: Ambulatory Visit | Attending: Cardiothoracic Surgery | Admitting: Cardiothoracic Surgery

## 2020-11-16 ENCOUNTER — Other Ambulatory Visit: Payer: Self-pay

## 2020-11-16 VITALS — BP 108/69 | HR 66 | Temp 97.7°F | Resp 20 | Wt 202.0 lb

## 2020-11-16 DIAGNOSIS — Z951 Presence of aortocoronary bypass graft: Secondary | ICD-10-CM

## 2020-11-16 IMAGING — CR DG CHEST 2V
2 series · 2 of 2 positions shown · non-contrast
Comparison: [DATE]

CLINICAL DATA: Post CABG

EXAM:
CHEST - 2 VIEW

[w chest pa]
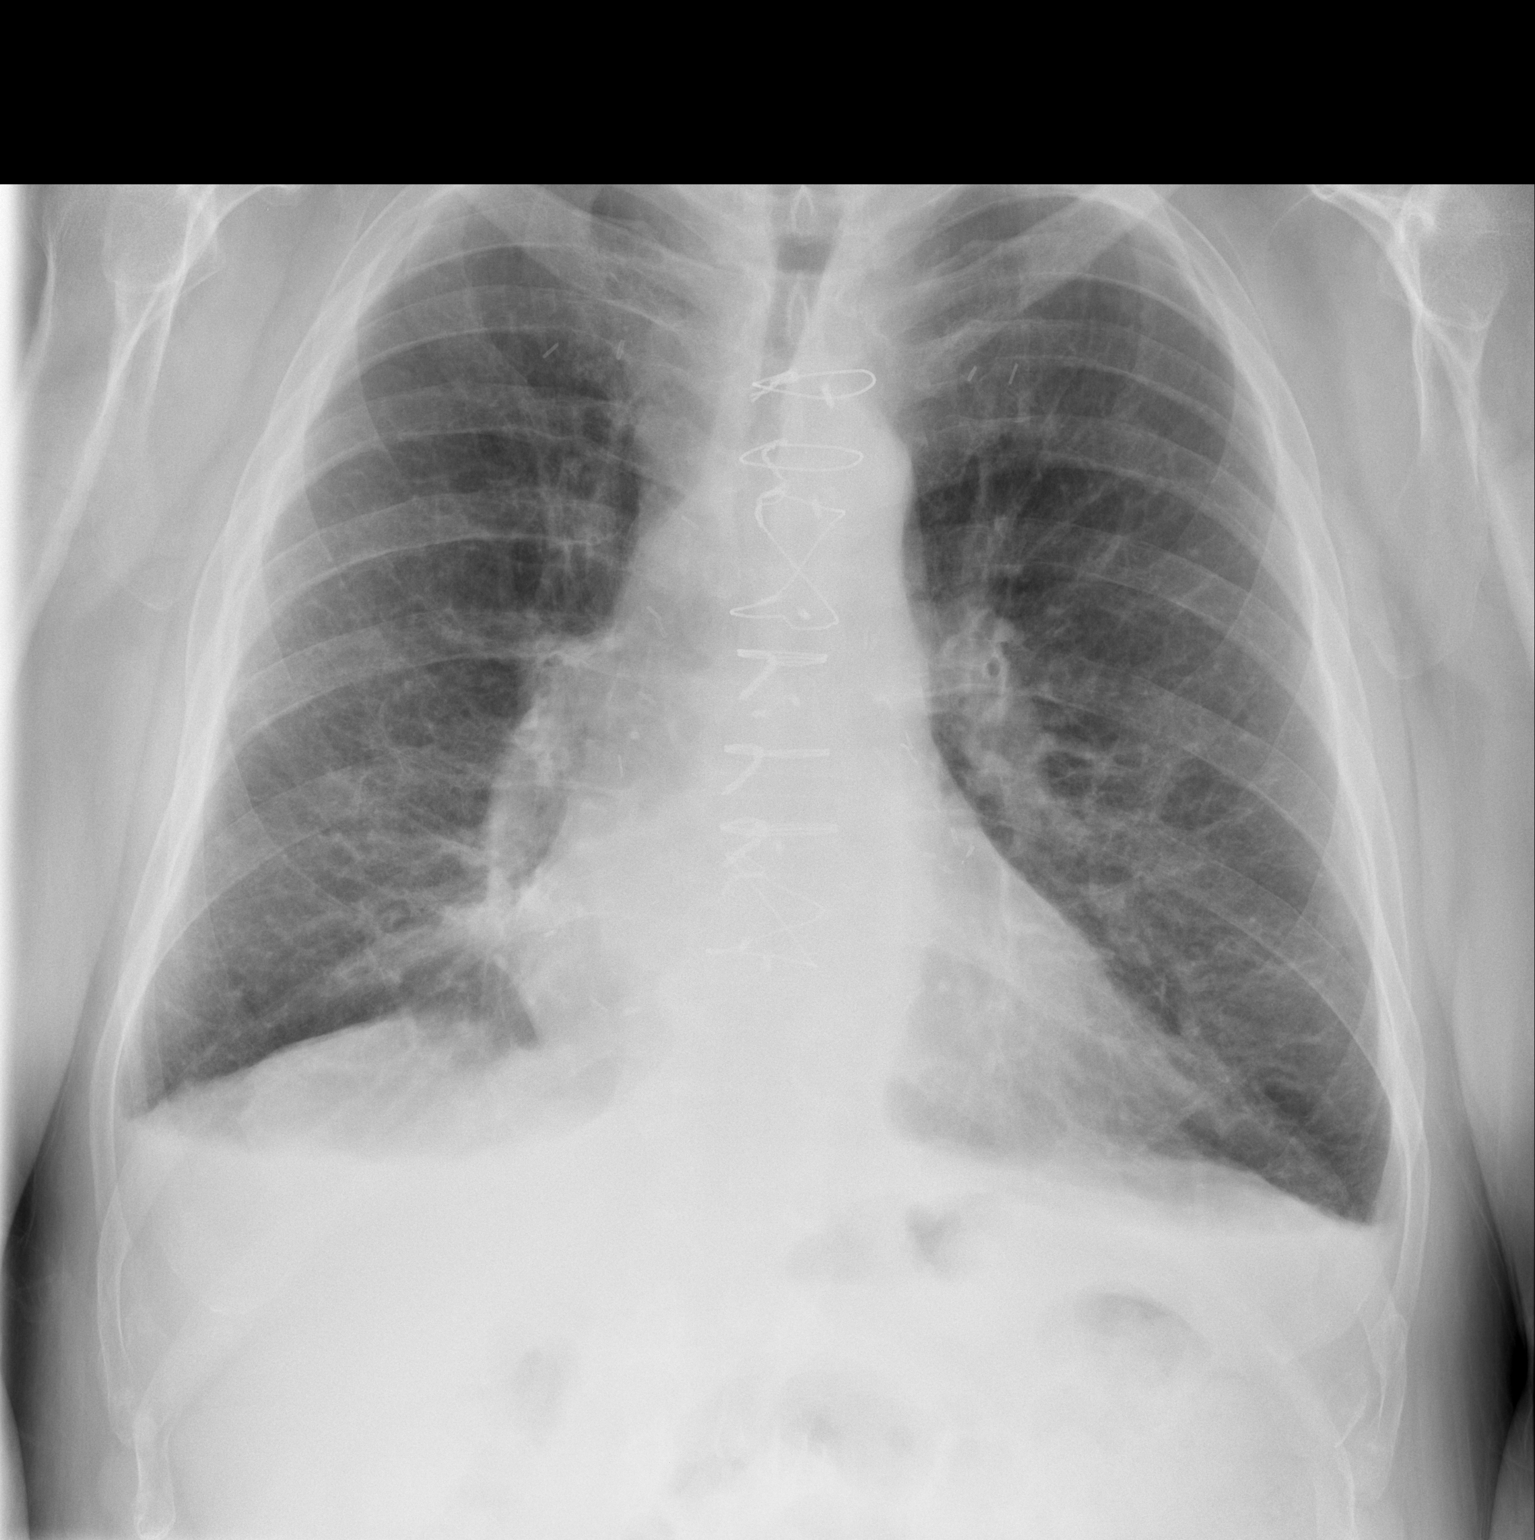

[w chest lat]
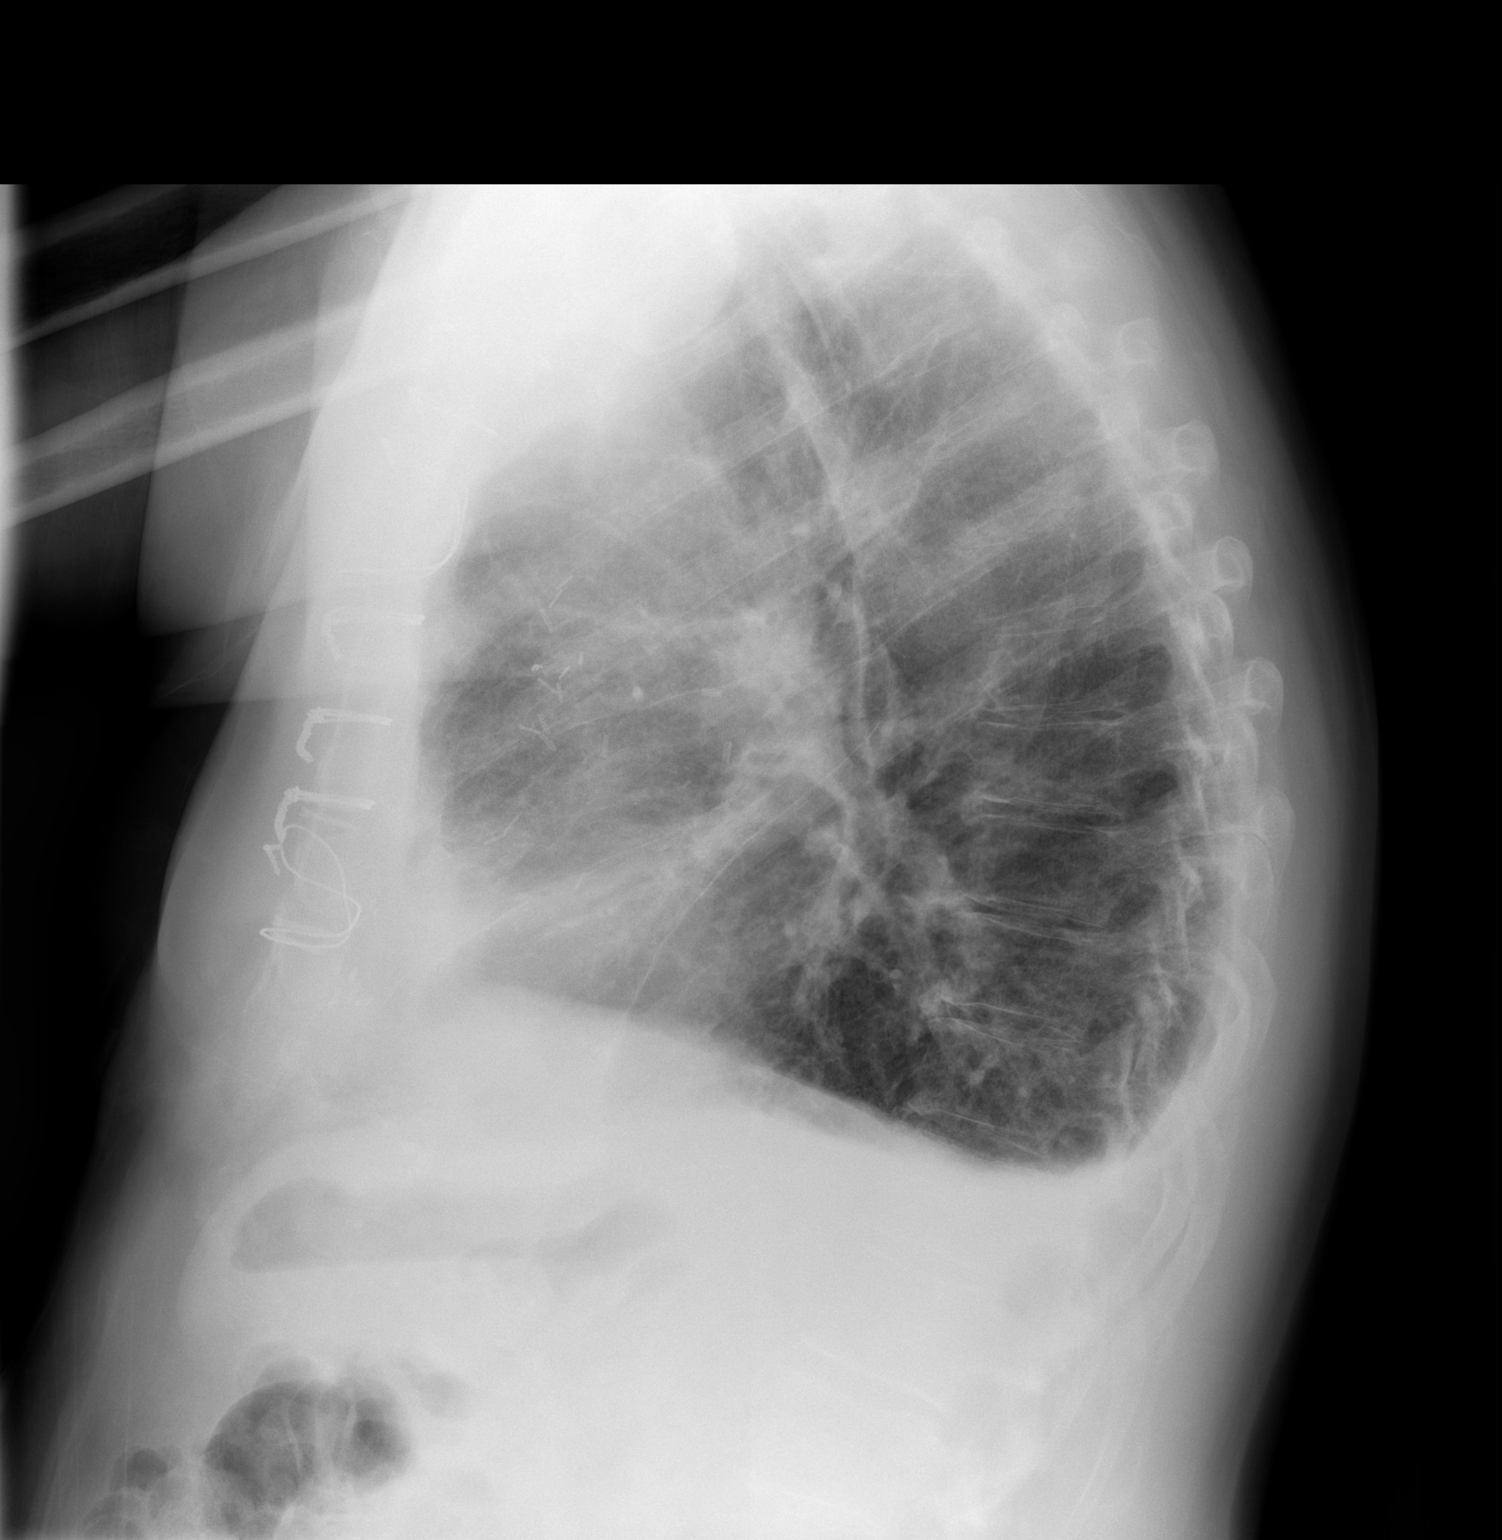

[2 of 2 positions shown; findings below may reference images not displayed]

FINDINGS: Cardiomediastinal contours are within normal limits. Prior CABG.
Trace bilateral pleural effusions with mild bibasilar atelectasis.
No pneumothorax. No acute osseous abnormality.
IMPRESSION: Trace residual bilateral pleural effusions and mild bibasilar
atelectasis.

## 2020-11-16 NOTE — Progress Notes (Signed)
[address]       [place] [address]             [phone]     CARDIOTHORACIC SURGERY OFFICE NOTE  Referring Provider is [name], MD Primary Cardiologist is Shelva Majestic, MD PCP is Leonard Downing, MD   HPI:  70 year old man is status post CABG from early February of this year.  His course was complicated by perioperative stroke which was mild in deficit mainly affecting his speech finding and swallowing.  He has no residual deficits.  He has been doing home physical therapy and is doing well.  He denies angina or shortness of breath.  He denies fevers and chills.  He is walking 3 times a day and is eating well   Current Outpatient Medications  Medication Sig Dispense Refill  . acetaminophen (TYLENOL) 325 MG tablet Take 1-2 tablets (325-650 mg total) by mouth every 4 (four) hours as needed for mild pain.    [name] aspirin 81 MG chewable tablet Chew 1 tablet (81 mg total) by mouth daily.    . bisacodyl (DULCOLAX) 5 MG EC tablet Take 2 tablets (10 mg total) by mouth daily. Can taper as needed 60 tablet 0  . budesonide-formoterol (SYMBICORT) 160-4.5 MCG/ACT inhaler Inhale 2 puffs into the lungs 2 (two) times daily. (Patient taking differently: Inhale 2 puffs into the lungs 2 (two) times daily as needed (asthma).) 1 each 11  . clopidogrel (PLAVIX) 75 MG tablet Take 1 tablet (75 mg total) by mouth daily. 30 tablet 0  . guaiFENesin 200 MG tablet Take 1 tablet (200 mg total) by mouth every 6 (six) hours as needed for cough or to loosen phlegm. 60 suppository 0  . metoprolol tartrate (LOPRESSOR) 25 MG tablet Take 0.5 tablets (12.5 mg total) by mouth 2 (two) times daily. 30 tablet 0  . nicotine (NICODERM CQ - DOSED IN MG/24 HOURS) 21 mg/24hr patch Place 1 patch (21 mg total) onto the skin daily. 28 patch 0  . nitroGLYCERIN (NITROSTAT) 0.4 MG SL tablet PLACE 1 TABLET UNDER THE TONGUE EVERY 5 MINUTES AS NEEDED FOR CHEST PAIN UP TO 3 DOSES, IF SYMPTOMS PERSIST  CALL 911 (Patient taking differently: Place 0.4 mg under the tongue every 5 (five) minutes as needed for chest pain.) 25 tablet 6  . sodium chloride (OCEAN) 0.65 % SOLN nasal spray Place 1 spray into both nostrils 2 (two) times daily at 8 am and 10 pm.  0   No current facility-administered medications for this visit.      Physical Exam:   BP 108/69 (BP Location: Left Arm, Patient Position: Sitting, Cuff Size: Large)   Pulse 66   Temp 97.7 F (36.5 C) (Skin)   Resp 20   Wt 91.6 kg   SpO2 99% Comment: RA  BMI 30.71 kg/m   General:  Is well-appearing no acute distress  Chest:   Clear to auscultation bilaterally  CV:   Regular rate and rhythm  Incisions:  Healing well  Abdomen:  Soft nontender  Extremities:  No edema  Diagnostic Tests:  PA and lateral chest x-ray from today shows clear lung fields and stable mediastinal silhouette    Impression: Doing well after CABG  Plan:  Okay to stop Lasix and potassium Okay to drive Follow-up with Dr. Claiborne Billings, [facility] cardiology Follow-up with thoracic surgery as needed  I spent in excess of 20 minutes during the conduct of this office consultation and >50% of  this time involved direct face-to-face encounter with the patient for counseling and/or coordination of their care.  Level 2                 10 minutes Level 3                 15 minutes Level 4                 25 minutes Level 5                 40 minutes  B.  Murvin Natal, MD 11/16/2020 10:18 AM

## 2020-11-17 ENCOUNTER — Other Ambulatory Visit: Payer: Self-pay

## 2020-11-17 ENCOUNTER — Telehealth: Payer: Self-pay | Admitting: Cardiovascular Disease

## 2020-11-17 DIAGNOSIS — I25110 Atherosclerotic heart disease of native coronary artery with unstable angina pectoris: Secondary | ICD-10-CM

## 2020-11-17 DIAGNOSIS — I10 Essential (primary) hypertension: Secondary | ICD-10-CM

## 2020-11-17 DIAGNOSIS — E785 Hyperlipidemia, unspecified: Secondary | ICD-10-CM

## 2020-11-17 NOTE — Telephone Encounter (Signed)
New message:    Patient calling to get a follow up only with Dr. Claiborne Billings. Patient just had surgery and would like to see him only to get his medication on track. Also would like to know if he need to also have blood work.

## 2020-11-17 NOTE — Telephone Encounter (Signed)
Returned call to patient he stated he recently had heart surgery.He is doing well.He is calling to schedule post hospital appointment with [facility].Appointment scheduled with [facility] 3/23 at 4:00 pm.He will have fasting cmet,lipid panel,cbc,tsh at Dr.Kelly's office the week before.

## 2020-11-19 ENCOUNTER — Encounter (HOSPITAL_COMMUNITY): Payer: Self-pay

## 2020-11-19 ENCOUNTER — Ambulatory Visit (HOSPITAL_COMMUNITY)
Admission: EM | Admit: 2020-11-19 | Discharge: 2020-11-19 | Disposition: A | Discharge status: Home / Self Care | Payer: Medicare Other | Attending: Emergency Medicine | Admitting: Emergency Medicine

## 2020-11-19 ENCOUNTER — Other Ambulatory Visit: Payer: Self-pay

## 2020-11-19 DIAGNOSIS — R040 Epistaxis: Secondary | ICD-10-CM

## 2020-11-19 LAB — CBC WITH DIFFERENTIAL/PLATELET
Basophils Absolute: 0.1 10*3/uL (ref 0.0–0.2)
Basos: 1 %
EOS (ABSOLUTE): 1 10*3/uL — ABNORMAL HIGH (ref 0.0–0.4)
Eos: 11 %
Hematocrit: 36.9 % — ABNORMAL LOW (ref 37.5–51.0)
Hemoglobin: 12 g/dL — ABNORMAL LOW (ref 13.0–17.7)
Immature Grans (Abs): 0 10*3/uL (ref 0.0–0.1)
Immature Granulocytes: 0 %
Lymphocytes Absolute: 1.7 10*3/uL (ref 0.7–3.1)
Lymphs: 18 %
MCH: 30.5 pg (ref 26.6–33.0)
MCHC: 32.5 g/dL (ref 31.5–35.7)
MCV: 94 fL (ref 79–97)
Monocytes Absolute: 0.6 10*3/uL (ref 0.1–0.9)
Monocytes: 7 %
Neutrophils Absolute: 6.1 10*3/uL (ref 1.4–7.0)
Neutrophils: 63 %
Platelets: 316 10*3/uL (ref 150–450)
RBC: 3.93 x10E6/uL — ABNORMAL LOW (ref 4.14–5.80)
RDW: 11.7 % (ref 11.6–15.4)
WBC: 9.5 10*3/uL (ref 3.4–10.8)

## 2020-11-19 LAB — COMPREHENSIVE METABOLIC PANEL
ALT: 9 IU/L (ref 0–44)
AST: 11 IU/L (ref 0–40)
Albumin/Globulin Ratio: 1.2 (ref 1.2–2.2)
Albumin: 3.8 g/dL (ref 3.7–4.7)
Alkaline Phosphatase: 89 IU/L (ref 44–121)
BUN/Creatinine Ratio: 10 (ref 10–24)
BUN: 10 mg/dL (ref 8–27)
Bilirubin Total: 0.3 mg/dL (ref 0.0–1.2)
CO2: 23 mmol/L (ref 20–29)
Calcium: 9.1 mg/dL (ref 8.6–10.2)
Chloride: 100 mmol/L (ref 96–106)
Creatinine, Ser: 0.96 mg/dL (ref 0.76–1.27)
Globulin, Total: 3.1 g/dL (ref 1.5–4.5)
Glucose: 88 mg/dL (ref 65–99)
Potassium: 4.8 mmol/L (ref 3.5–5.2)
Sodium: 140 mmol/L (ref 134–144)
Total Protein: 6.9 g/dL (ref 6.0–8.5)
eGFR: 85 mL/min/{1.73_m2} (ref >59)

## 2020-11-19 LAB — LIPID PANEL
Chol/HDL Ratio: 4.3 ratio (ref 0.0–5.0)
Cholesterol, Total: 187 mg/dL (ref 100–199)
HDL: 44 mg/dL (ref >39)
LDL Chol Calc (NIH): 112 mg/dL — ABNORMAL HIGH (ref 0–99)
Triglycerides: 177 mg/dL — ABNORMAL HIGH (ref 0–149)
VLDL Cholesterol Cal: 31 mg/dL (ref 5–40)

## 2020-11-19 LAB — TSH: TSH: 1.8 u[IU]/mL (ref 0.450–4.500)

## 2020-11-19 MED ORDER — OXYMETAZOLINE HCL 0.05 % NA SOLN
NASAL | Status: AC
  Filled 2020-11-19: qty 30

## 2020-11-19 MED ORDER — OXYMETAZOLINE HCL 0.05 % NA SOLN
1.0000 | Freq: Once | NASAL | Status: AC
  Administered 2020-11-19: 1 via NASAL

## 2020-11-19 NOTE — ED Provider Notes (Addendum)
Jonathan [name]  ____________________________________________  Time seen: Approximately 12:04 PM  I have reviewed the triage vital signs and the nursing notes.   HISTORY  Chief Complaint Epistaxis   Historian Patient     HPI [name] is a 70 y.o. male presents to the urgent care with concern for epistaxis.  Patient states that yesterday he developed epistaxis from left nare.  He states that he packed the nose with cotton balls and has since had bloody tinged drainage from packing and is concerned that bleeding is still active.  He denies new chest pain, chest tightness or shortness of breath.  Patient states he is not have a history of nosebleeds. No new nasal sprays.   Past Medical History:  Diagnosis Date  . Arthritis    hands   . Chronic bronchitis ([name])   . Chronic sinusitis of both maxillary sinuses 04/16/2018   Sinus CT 04/25/2018 Mild mucoperiosteal thickening involving the frontoethmoidal sinuses. Otherwise largely clear sinuses with no evidence for acute sinusitis.   [name] COPD GOLD II   active smoker 04/16/2018   Spirometry 04/16/2018  FEV1 1.43 (47%)  Ratio 62  s prior rx  - 04/16/2018    try symbicort 160 2bid - PFT's  06/08/2018  FEV1 2.03 (71 % ) ratio 61  p 5 % improvement from saba p nothing prior to study with DLCO  63 % corrects to 72  % for alv volume    -  06/08/2018  After extensive coaching inhaler device,  effectiveness =    75%  (short ti/ variable flow) > continue symbicort 160 2bid   . Coronary artery disease   . 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 DOE (dyspnea on exertion) 04/16/2018  . Essential hypertension, benign 09/23/2012   Changed acei to arb 04/16/2018 due to pseudowheeze > improved 06/08/2018   . Hernia, inguinal 09/23/2012  . Hyperlipidemia    has not tolerated statins,  would not pursue PSK9 inhibitor as advised by Dr Claiborne Billings  . Hypertension   . Myocardial infarction  ([place])   . NSTEMI (non-ST elevated myocardial infarction) ([place]) 09/23/2012  . 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 Tobacco abuse      Immunizations up to date:  Yes.     Past Medical History:  Diagnosis Date  . Arthritis    hands   . Chronic bronchitis ([place])   . Chronic sinusitis of both maxillary sinuses 04/16/2018   Sinus CT 04/25/2018 Mild mucoperiosteal thickening involving the frontoethmoidal sinuses. Otherwise largely clear sinuses with no evidence for acute sinusitis.   [name] COPD GOLD II   active smoker 04/16/2018   Spirometry 04/16/2018  FEV1 1.43 (47%)  Ratio 62  s prior rx  - 04/16/2018    try symbicort 160 2bid - PFT's  06/08/2018  FEV1 2.03 (71 % ) ratio 61  p 5 % improvement from saba p nothing prior to study with DLCO  63 % corrects to 72  % for alv volume    -  06/08/2018  After extensive coaching inhaler device,  effectiveness =    75%  (short ti/ variable flow) > continue symbicort 160 2bid   . Coronary artery disease   . Coronary artery disease involving native coronary artery of native heart with unstable angina pectoris ([name])   . DOE (dyspnea on exertion) 04/16/2018  . Essential hypertension, benign 09/23/2012   Changed acei to arb 04/16/2018 due to  pseudowheeze > improved 06/08/2018   . Hernia, inguinal 09/23/2012  . Hyperlipidemia    has not tolerated statins,  would not pursue PSK9 inhibitor as advised by Dr Claiborne Billings  . Hypertension   . Myocardial infarction ([name])   . NSTEMI (non-ST elevated myocardial infarction) ([name]) 09/23/2012  . 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 Tobacco abuse     Patient Active Problem List   Diagnosis Date Noted  . Cerebellar stroke ([name]) 10/25/2020  . Cerebral embolism with cerebral infarction 10/21/2020  . History of open heart surgery   . Tachypnea   . ETOH abuse   . Tobacco abuse   . Essential hypertension   . Chronic obstructive pulmonary disease ([place])   . 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 Coronary artery disease involving native coronary artery  of native heart with unstable angina pectoris ([name])   . Cigarette smoker 04/17/2018  . DOE (dyspnea on exertion) 04/16/2018  . Chronic sinusitis of both maxillary sinuses 04/16/2018  . COPD GOLD II   active smoker 04/16/2018  . Obesity (BMI 30.0-34.9) 12/16/2015  . Tobacco abuse counseling 08/05/2014  . 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 Essential hypertension, benign 09/23/2012  . NSTEMI (non-ST elevated myocardial infarction) ([name]) 09/23/2012  . Hyperlipidemia 09/23/2012  . Hernia, inguinal 09/23/2012    Past Surgical History:  Procedure Laterality Date  . CARDIAC CATHETERIZATION    . CARDIOVASCULAR STRESS TEST  11/11/2011  . CORONARY ANGIOPLASTY     stents- 01/2004   . CORONARY ARTERY BYPASS GRAFT N/A 10/15/2020   Procedure: CORONARY ARTERY BYPASS GRAFTING (CABG) TIMES FOUR USING BILATERAL INTERNAL MAMMARY ARTERIES AND ENDOSCOPICALLY HARVESTED GREATER SAPHENOUS VEIN AND INSERTION OF 50CC INTRAAORTIC BALLOON PUMP;  Surgeon: [name], MD;  Location: [facility];  Service: Open Heart Surgery;  Laterality: N/A;  possible BIMA  . ENDOVEIN HARVEST OF GREATER SAPHENOUS VEIN Right 10/15/2020   Procedure: ENDOVEIN HARVEST OF GREATER SAPHENOUS VEIN;  Surgeon: [name], MD;  Location: [facility];  Service: Open Heart Surgery;  Laterality: Right;  . heart stents  01/2004  . INGUINAL HERNIA REPAIR Right 12/06/2012   Procedure: HERNIA REPAIR INGUINAL ADULT;  Surgeon: [name], DO;  Location: [facility];  Service: General;  Laterality: Right;  . INSERTION OF MESH Right 12/06/2012   Procedure: INSERTION OF MESH;  Surgeon: [name], DO;  Location: [facility];  Service: General;  Laterality: Right;  . LEFT HEART CATH AND CORONARY ANGIOGRAPHY N/A 07/22/2019   Procedure: LEFT HEART CATH AND CORONARY ANGIOGRAPHY;  Surgeon: [name], MD;  Location: [place] [place] [facility];  Service: Cardiovascular;  Laterality: N/A;  . LEFT HEART CATH AND CORONARY ANGIOGRAPHY N/A 10/14/2020   Procedure: LEFT HEART CATH  AND CORONARY ANGIOGRAPHY;  Surgeon: [name], MD;  Location: [place] [facility];  Service: Cardiovascular;  Laterality: N/A;  . TEE WITHOUT CARDIOVERSION N/A 10/15/2020   Procedure: TRANSESOPHAGEAL ECHOCARDIOGRAM (TEE);  Surgeon: [name], MD;  Location: [facility];  Service: Open Heart Surgery;  Laterality: N/A;    Prior to Admission medications   Medication Sig Start Date End Date Taking? Authorizing Provider  acetaminophen (TYLENOL) 325 MG tablet Take 1-2 tablets (325-650 mg total) by mouth every 4 (four) hours as needed for mild pain. 11/04/20   Love, Ivan Anchors, PA-C  aspirin 81 MG chewable tablet Chew 1 tablet (81 mg total) by mouth daily. 11/04/20   Love, Ivan Anchors, PA-C  bisacodyl (DULCOLAX) 5 MG EC tablet Take 2 tablets (10 mg  total) by mouth daily. Can taper as needed 11/05/20   Love, Ivan Anchors, PA-C  budesonide-formoterol [facility]) 160-4.5 MCG/ACT inhaler Inhale 2 puffs into the lungs 2 (two) times daily. Patient taking differently: Inhale 2 puffs into the lungs 2 (two) times daily as needed (asthma). 08/25/20   Tanda Rockers, MD  clopidogrel (PLAVIX) 75 MG tablet Take 1 tablet (75 mg total) by mouth daily. 11/04/20   Love, Ivan Anchors, PA-C  guaiFENesin 200 MG tablet Take 1 tablet (200 mg total) by mouth every 6 (six) hours as needed for cough or to loosen phlegm. 11/04/20   Love, Ivan Anchors, PA-C  metoprolol tartrate (LOPRESSOR) 25 MG tablet Take 0.5 tablets (12.5 mg total) by mouth 2 (two) times daily. 11/04/20   Love, Ivan Anchors, PA-C  nicotine (NICODERM CQ - DOSED IN MG/24 HOURS) 21 mg/24hr patch Place 1 patch (21 mg total) onto the skin daily. 11/04/20   Love, Ivan Anchors, PA-C  nitroGLYCERIN (NITROSTAT) 0.4 MG SL tablet PLACE 1 TABLET UNDER THE TONGUE EVERY 5 MINUTES AS NEEDED FOR CHEST PAIN UP TO 3 DOSES, IF SYMPTOMS PERSIST CALL 911 Patient taking differently: Place 0.4 mg under the tongue every 5 (five) minutes as needed for chest pain. 09/07/20   [name], MD  sodium chloride  (OCEAN) 0.65 % SOLN nasal spray Place 1 spray into both nostrils 2 (two) times daily at 8 am and 10 pm. 11/04/20   Love, Ivan Anchors, PA-C    Allergies Niaspan [niacin er] and Statins  Family History  Problem Relation Age of Onset  . CAD Father        CABG with subsequent stroke    Social History Social History   Tobacco Use  . Smoking status: Former Smoker    Packs/day: 1.00    Years: 53.00    Pack years: 53.00    Types: Cigarettes    Quit date: 10/14/2020    Years since quitting: 0.0  . Smokeless tobacco: Never Used  Vaping Use  . Vaping Use: Never used  Substance Use Topics  . Alcohol use: Yes    Alcohol/week: 5.0 standard drinks    Types: 5 Standard drinks or equivalent per week    Comment: consumes 1 pint per day   . Drug use: No     Review of Systems  Constitutional: No fever/chills Eyes:  No discharge ENT: Patient has epistaxis.  Respiratory: no cough. No SOB/ use of accessory muscles to breath Gastrointestinal:   No nausea, no vomiting.  No diarrhea.  No constipation. Musculoskeletal: Negative for musculoskeletal pain. Skin: Negative for rash, abrasions, lacerations, ecchymosis.    ____________________________________________   PHYSICAL EXAM:  VITAL SIGNS: ED Triage Vitals  Enc Vitals Group     BP 11/19/20 1156 122/67     Pulse Rate 11/19/20 1156 76     Resp 11/19/20 1156 19     Temp 11/19/20 1156 98.5 F (36.9 C)     Temp src --      SpO2 11/19/20 1156 96 %     Weight --      Height --      Head Circumference --      Peak Flow --      Pain Score 11/19/20 1155 0     Pain Loc --      Pain Edu? --      Excl. in [place]? --      Constitutional: Alert and oriented. Well appearing and in no acute distress. Eyes: Conjunctivae  are normal. PERRL. EOMI. Head: Atraumatic. ENT:      Nose: No active bleeding from left nare.       Mouth/Throat: Mucous membranes are moist.  Neck: No stridor.  No cervical spine tenderness to palpation. Cardiovascular:  Normal rate, regular rhythm. Normal S1 and S2.  Good peripheral circulation. Respiratory: Normal respiratory effort without tachypnea or retractions. Lungs CTAB. Good air entry to the bases with no decreased or absent breath sounds Gastrointestinal: Bowel sounds x 4 quadrants. Soft and nontender to palpation. No guarding or rigidity. No distention. Musculoskeletal: Full range of motion to all extremities. No obvious deformities noted Neurologic:  Normal for age. No gross focal neurologic deficits are appreciated.  Skin:  Skin is warm, dry and intact. No rash noted. Psychiatric: Mood and affect are normal for age. Speech and behavior are normal.   ____________________________________________   LABS (all labs ordered are listed, but only abnormal results are displayed)  Labs Reviewed - No data to display ____________________________________________  EKG   ____________________________________________  RADIOLOGY   No results found.  ____________________________________________    PROCEDURES  Procedure(s) performed:     Procedures     Medications  oxymetazoline (AFRIN) 0.05 % nasal spray 1 spray (1 spray Each Nare Given 11/19/20 1211)     ____________________________________________   INITIAL IMPRESSION / ASSESSMENT AND PLAN / ED COURSE  Pertinent labs & imaging results that were available during my care of the patient were reviewed by me and considered in my medical decision making (see chart for details).      Assessment and plan Epistaxis 70 year old male presents to the urgent care with concern for epistaxis from the left nare.  Packing was removed and there was no active bleeding.  Patient was given 1 spray of Afrin for each side and was observed in the urgent care with no return of bleeding.  Patient felt comfortable being discharged from the urgent care.  Return precautions were given to return with new or worsening  symptoms.     ____________________________________________  FINAL CLINICAL IMPRESSION(S) / ED DIAGNOSES  Final diagnoses:  Epistaxis      NEW MEDICATIONS STARTED DURING THIS VISIT:  ED Discharge Orders    None          This chart was dictated using voice recognition software/Dragon. Despite best efforts to proofread, errors can occur which can change the meaning. Any change was purely unintentional.     Lannie Fields, PA-C 11/19/20 1250    Vallarie Mare [place], [place] 11/19/20 1315

## 2020-11-19 NOTE — ED Triage Notes (Addendum)
Pt in with c/o nose bleed since yesterday. Pt states that it is constantly bleeding ever since he blew his nose No active bleeding

## 2020-12-01 ENCOUNTER — Other Ambulatory Visit: Payer: Self-pay | Admitting: Cardiovascular Disease

## 2020-12-01 DIAGNOSIS — E785 Hyperlipidemia, unspecified: Secondary | ICD-10-CM

## 2020-12-01 DIAGNOSIS — I25118 Atherosclerotic heart disease of native coronary artery with other forms of angina pectoris: Secondary | ICD-10-CM

## 2020-12-01 DIAGNOSIS — I6523 Occlusion and stenosis of bilateral carotid arteries: Secondary | ICD-10-CM

## 2020-12-01 DIAGNOSIS — Z789 Other specified health status: Secondary | ICD-10-CM

## 2020-12-02 ENCOUNTER — Ambulatory Visit: Payer: Medicare Other | Admitting: Cardiovascular Disease

## 2020-12-02 ENCOUNTER — Inpatient Hospital Stay: Payer: Medicare Other | Admitting: Adult Health

## 2020-12-02 ENCOUNTER — Encounter: Payer: Self-pay | Admitting: Cardiovascular Disease

## 2020-12-02 ENCOUNTER — Other Ambulatory Visit: Payer: Self-pay

## 2020-12-02 ENCOUNTER — Telehealth: Payer: Self-pay | Admitting: *Deleted

## 2020-12-02 VITALS — BP 132/70 | HR 54 | Ht 68.0 in | Wt 215.4 lb

## 2020-12-02 DIAGNOSIS — Z79899 Other long term (current) drug therapy: Secondary | ICD-10-CM

## 2020-12-02 DIAGNOSIS — I10 Essential (primary) hypertension: Secondary | ICD-10-CM | POA: Diagnosis not present

## 2020-12-02 DIAGNOSIS — Z951 Presence of aortocoronary bypass graft: Secondary | ICD-10-CM

## 2020-12-02 DIAGNOSIS — I639 Cerebral infarction, unspecified: Secondary | ICD-10-CM

## 2020-12-02 DIAGNOSIS — Z789 Other specified health status: Secondary | ICD-10-CM

## 2020-12-02 DIAGNOSIS — I2510 Atherosclerotic heart disease of native coronary artery without angina pectoris: Secondary | ICD-10-CM

## 2020-12-02 DIAGNOSIS — Z720 Tobacco use: Secondary | ICD-10-CM

## 2020-12-02 DIAGNOSIS — I214 Non-ST elevation (NSTEMI) myocardial infarction: Secondary | ICD-10-CM

## 2020-12-02 DIAGNOSIS — E785 Hyperlipidemia, unspecified: Secondary | ICD-10-CM

## 2020-12-02 MED ORDER — REPATHA SURECLICK 140 MG/ML ~~LOC~~ SOAJ: 140.0000 mg | SUBCUTANEOUS | 11 refills | Status: DC

## 2020-12-02 NOTE — Telephone Encounter (Signed)
Peter PT from [facility] called to let us know that they are discharging Mr Jonathan [name] from [facility] services due to [name] cancelled all his appts with them.

## 2020-12-02 NOTE — Patient Instructions (Addendum)
Medication Instructions:  STOP- Metoprolol Tartrate when bottler is completed START- Metoprolol Succinate 25 mg by mouth daily START- Repatha   *If you need a refill on your cardiac medications before your next appointment, please call your pharmacy*   Lab Work: CBC, CMP, Fasting Lipids in 3 Months  If you have labs (blood work) drawn today and your tests are completely normal, you will receive your results only by: [name] MyChart Message (if you have MyChart) OR . A paper copy in the mail If you have any lab test that is abnormal or we need to change your treatment, we will call you to review the results.   Testing/Procedures: None Ordered   Follow-Up: At [facility], you and your health needs are our priority.  As part of our continuing mission to provide you with exceptional heart care, we have created designated Provider Care Teams.  These Care Teams include your primary Cardiologist (physician) and Advanced Practice Providers (APPs -  Physician Assistants and Nurse Practitioners) who all work together to provide you with the care you need, when you need it.  We recommend signing up for the patient portal called "MyChart".  Sign up information is provided on this After Visit Summary.  MyChart is used to connect with patients for Virtual Visits (Telemedicine).  Patients are able to view lab/test results, encounter notes, upcoming appointments, etc.  Non-urgent messages can be sent to your provider as well.   To learn more about what you can do with MyChart, go to [email or web address].    Your next appointment:   3 month(s)  The format for your next appointment:   In Person  Provider:   You may see Shelva Majestic, MD or one of the following Advanced Practice Providers on your designated Care Team:    Almyra Deforest, PA-C  Fabian Sharp, PA-C or   Roby Lofts, [place]

## 2020-12-02 NOTE — Progress Notes (Signed)
Patient ID: Jonathan [name], male   DOB: 1950/09/13, 70 y.o.   MRN: [id number]    Primary M.D.: Dr. Claris Gower  HPI: Jonathan [name] is a 70 y.o. male who presents to the office today for a follow-up cardiology evaluation after his recent CABG revascularization.   Jonathan [name] has a history of coronary artery disease and on 01/11/2004 suffered an inferior wall myocardial infarction. Cardiac catheterization revealed total occlusion of the RCA and underwent successful intervention with ultimate insertion of a 3.0x33 mm and 3.0x13 mm drug-eluting Cipher stent with restoration of TIMI-3 flow. Additional problems include hyperlipidemia and ongoing tobacco use.  He admits to smoking at least one pack of cigarettes per day. He has a history of hypertension and has been on ramipril 10 mg in addition to Toprol-XL 25 mg.  He's been maintained on dual antiplatelet therapy with aspirin and Plavix.  He also takes Advair Diskus for his lung disease.  He has had intolerance to statins secondary to increased LFTs as well as CPK elevation. He had been on WelChol, fenofibrate, Zetia but apparently has not taking any lipid lowering therapy presently. His last nuclear perfusion study in March 2013 was unchanged from prior studies and did not show any evidence for scar or ischemia with only minimal residual basilar inferior thinning.  There was significant myocardial salvage from his successful reperfusion in the setting of his MI.Ejection fraction was 57%.  He sees Dr. Claris Gower, for primary care.  Laboratory by Dr. Arelia Sneddon in 07/15/2014 showed normal renal function with a BUN of 11,Cr0.98.  His lipids were markedly abnormal with a total cholesterol of 270, triglycerides of 368, VLDL of 74, and LDL cholesterol of 152.  HDL cholesterol was 44. Hemoglobin C was 5.6.  When I saw him in April 2018, he denied any episodes of chest pain or palpitations.  Unfortunately, he was continuing to smoke and had started smoking at age 78.   He has COPD.  I last saw him in the office in January 2019.  Unfortunately he continues to smoke.  He has no interest in quitting.  He has been tried on multiple lipid-lowering agents develop increased LFTs and CPK elevation on statins.  In addition, I've tried WelChol, fenofibrate, and Zetia.  I last saw him, I discussed the possibility of Repatha.  He had undergone repeat laboratory in July 2018, which continue to show a total cholesterol 238, triglycerides 308, HDL 47, VLDL 62, and LDL cholesterol at 129.    Jonathan [name] was admitted to [facility] July 30, 2019 with a non-ST segment elevation MI.  At that time, I performed repeat cardiac catheterization after he consented to changes DNR status for the catheterization.  This revealed significant multivessel CAD with 90% diffuse proximal stenosis in the first diagonal branch of the LAD with 70% LAD stenosis beyond the diagonal takeoff.  There was 95% ostial left circumflex stenosis the vessel immediately bifurcating into the AV groove and circumflex.  The RCA had 40% the previously placed proximal stent with focal 80% mid distal in-stent restenosis and new 70% stenosis beyond the stented segment proximal to the acute margin large dominant RCA.  An echo Doppler study revealed EF of 55 to 60% without LVH.  ``Following the catheterization we discussed consideration for possible CABG revascularization surgery and obtain surgical consultation.  Apparently, the patient refused to consider CABG revascularization ultimately was subsequently discharged.  He has  been seen by Roby Lofts, PA-C in the office on August 02, 2019.  He again stated his desire not to have surgery.  I saw him for his post hospital evaluation with me on November 05, 2019.  At that time he admitted to experiencing almost daily episodes of angina for several months.  He was not very active.  He also admits to exertional dyspnea.  He  reduced his 1 pack/day tobacco habit down to 1/2  pack/day.  He has been smoking for over 60 years.  During his hospitalization, he had marked hyperlipidemia with total cholesterol 288, LDL cholesterol 196.  He was ultimately approved for Repatha and received his first PCSK9 inhibitor injection on October 28, 2019.  During that evaluation, I recommended the further titration of isosorbide mononitrate to 60 mg and increase Toprol-XL to 37.5 mg from his present dose of 25 mg.  I also recommended the addition of amlodipine 2.5 mg for the first week and then as long as his blood pressure is doing well he could further increase this to 5 mg.  He did not feel the 2.5 mg dose was effective, never titrated it and ultimately stopped taking it.    I  saw him in June 2021 at which time he was continuing to experience frequent anginal symptoms with activity.  Unfortunately he was still smoking half pack of cigarettes per day.  He had received 8 Repatha injections over the previous several months.  During that evaluation, I again discussed potential future reconsideration of CABG revascularization.  During that evaluation, his blood pressure was stable but on the low side.  He had never titrated amlodipine from 2.5 mg and felt it was not necessary so therefore stopped taking it.  During that evaluation I recommended initiation of ranolazine at 500 mg twice a day with further plans to titrate.  When I last saw him on 08/03/2020 he walked into the office today complaining of ongoing chest pain. Immediately upon coming into the room, he was placed on a stretcher but states he could not lie flat so therefore he was somewhat upright.  He was given sublingual nitroglycerin.  His chest pain promptly relieved.  ECG was taken which did not reveal any acute ischemic changes.  He states he typically experiences chest pain when he wakes up and then typically has another episode of chest pain approximately 4 PM in the afternoon and this occurs almost on a daily basis.  He has been on a  regimen of amlodipine which I had titrated to 5 mg at his last visit, aspirin/Plavix, isosorbide 60 mg in the morning, metoprolol succinate 37.5 mg daily, ramipril 10 mg, and Ranexa 500 mg twice a day.  He continues to receive Repatha injections 140 mg every 2 weeks.  During that evaluation, I recommended further titration of his anti-ischemic medications and increased his isosorbide to 90 mg in the morning and recommended a 30 mg at night and increases Ranexa to 1000 mg twice a day.  That evaluation I again discussed reconsideration for CABG revascularization.  He left the office stating that he will contemplate this and may want to pursue this after the new year.  I last saw him on October 07, 2020.  His chest pain did improve with increased medical therapy but he continued to experience recurrent chest pain symptomatology with very frequent sublingual nitroglycerin use.  He presented to the emergency room on 09/17/2019.  His ECG was stable.  His amlodipine dose was increased to 10 mg.  On his increased dose he has noticed lower extremity and  ankle swelling.  At his January 2022 office visit, he was continuing to have  daily early morning chest pain and was taking approximately 10-15 sublingual nitroglycerin per week.  He presented for evaluation and at that time had decided to pursue CABG revascularization  I performed cardiac catheterization on October 14, 2020 which revealed severe multivessel progressive CAD with 40 to 50% ostial left main stenosis, diffuse 95% stenosis in the first diagonal branch of the LAD with 70% LAD stenosis beyond the diagonal vessel; 99% left circumflex stenosis and progressive 85% in-stent restenosis in a dominant RCA with 90% stenosis at the initiation of the proximal stent with 70% stenosis proximal to the AV groove.  LV function remains relatively preserved with EF estimate at 50 to 55%.  Plavix had been washed out prior to the procedure.  The following day he underwent  successful CABG x 4 revascularization surgery by Dr. Julien Girt with a LIMA to LAD, pedicled RIMA to the right posterior lateral coronary artery, SVG to the OM1 branch of the circumflex, and SVG to the first diagonal branch.  His course was complicated by postoperative small stroke with small infarct in the left posterior cerebellar hemisphere.  He was evaluated by neurology.  RA showed patchy small volume early subacute ischemic infarcts involving the left cerebellar hemisphere.  He ultimately was discharged and spent 10 days in inpatient rehab.  He saw Dr. Julien Girt for office follow-up evaluation on November 16, 2020 and at that time remained stable from a cardiovascular surgical standpoint.  His only, he denies any recurrent episodes of chest pain.  He apparently is no longer taking amlodipine, ranolazine, isosorbide, ramipril therapy.  He apparently had stopped his Repatha for approximately 3 months this will need to be resumed.  He denies recurrent anginal symptoms.  He continues to be on aspirin and Plavix, metoprolol 12.5 mg twice a day.  He is using a nicotine patch in place of his tobacco.  He presents for reevaluation.  Past Medical History:  Diagnosis Date  . Arthritis    hands   . Chronic bronchitis ([place])   . Chronic sinusitis of both maxillary sinuses 04/16/2018   Sinus CT 04/25/2018 Mild mucoperiosteal thickening involving the frontoethmoidal sinuses. Otherwise largely clear sinuses with no evidence for acute sinusitis.   [name] COPD GOLD II   active smoker 04/16/2018   Spirometry 04/16/2018  FEV1 1.43 (47%)  Ratio 62  s prior rx  - 04/16/2018    try symbicort 160 2bid - PFT's  06/08/2018  FEV1 2.03 (71 % ) ratio 61  p 5 % improvement from saba p nothing prior to study with DLCO  63 % corrects to 72  % for alv volume    -  06/08/2018  After extensive coaching inhaler device,  effectiveness =    75%  (short ti/ variable flow) > continue symbicort 160 2bid   . Coronary artery disease   . Coronary artery disease  involving native coronary artery of native heart with unstable angina pectoris ([place])   . DOE (dyspnea on exertion) 04/16/2018  . Essential hypertension, benign 09/23/2012   Changed acei to arb 04/16/2018 due to pseudowheeze > improved 06/08/2018   . Hernia, inguinal 09/23/2012  . Hyperlipidemia    has not tolerated statins,  would not pursue PSK9 inhibitor as advised by Dr Claiborne Billings  . Hypertension   . Myocardial infarction ([place])   . NSTEMI (non-ST elevated myocardial infarction) ([place]) 09/23/2012  . 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 Tobacco abuse  Past Surgical History:  Procedure Laterality Date  . CARDIAC CATHETERIZATION    . CARDIOVASCULAR STRESS TEST  11/11/2011  . CORONARY ANGIOPLASTY     stents- 01/2004   . CORONARY ARTERY BYPASS GRAFT N/A 10/15/2020   Procedure: CORONARY ARTERY BYPASS GRAFTING (CABG) TIMES FOUR USING BILATERAL INTERNAL MAMMARY ARTERIES AND ENDOSCOPICALLY HARVESTED GREATER SAPHENOUS VEIN AND INSERTION OF 50CC INTRAAORTIC BALLOON PUMP;  Surgeon: [name], MD;  [facility];  Service: Open Heart Surgery;  Laterality: N/A;  possible BIMA  . ENDOVEIN HARVEST OF GREATER SAPHENOUS VEIN Right 10/15/2020   Procedure: ENDOVEIN HARVEST OF GREATER SAPHENOUS VEIN;  Surgeon: [name], MD;  Location: [facility];  Service: Open Heart Surgery;  Laterality: Right;  . heart stents  01/2004  . INGUINAL HERNIA REPAIR Right 12/06/2012   Procedure: HERNIA REPAIR INGUINAL ADULT;  Surgeon: [name], DO;  Location: [facility];  Service: General;  Laterality: Right;  . INSERTION OF MESH Right 12/06/2012   Procedure: INSERTION OF MESH;  Surgeon: [name], DO;  Location: [facility];  Service: General;  Laterality: Right;  . LEFT HEART CATH AND CORONARY ANGIOGRAPHY N/A 07/22/2019   Procedure: LEFT HEART CATH AND CORONARY ANGIOGRAPHY;  Surgeon: [name], MD;  [facility];  Service: Cardiovascular;  Laterality: N/A;  . LEFT HEART CATH AND CORONARY ANGIOGRAPHY N/A  10/14/2020   Procedure: LEFT HEART CATH AND CORONARY ANGIOGRAPHY;  Surgeon: [name], MD;  Location: [facility];  Service: Cardiovascular;  Laterality: N/A;  . TEE WITHOUT CARDIOVERSION N/A 10/15/2020   Procedure: TRANSESOPHAGEAL ECHOCARDIOGRAM (TEE);  Surgeon: [name], MD;  Location: [facility];  Service: Open Heart Surgery;  Laterality: N/A;    Allergies  Allergen Reactions  . Niaspan [Niacin Er] Itching and Other (See Comments)    insomnia  . Statins     LFT elevavation, CK elevevation    Current Outpatient Medications  Medication Sig Dispense Refill  . acetaminophen (TYLENOL) 325 MG tablet Take 1-2 tablets (325-650 mg total) by mouth every 4 (four) hours as needed for mild pain.    [name] aspirin 81 MG chewable tablet Chew 1 tablet (81 mg total) by mouth daily.    . budesonide-formoterol (SYMBICORT) 160-4.5 MCG/ACT inhaler Inhale 2 puffs into the lungs 2 (two) times daily. (Patient taking differently: Inhale 2 puffs into the lungs 2 (two) times daily as needed (asthma).) 1 each 11  . clopidogrel (PLAVIX) 75 MG tablet Take 1 tablet (75 mg total) by mouth daily. 30 tablet 0  . guaiFENesin 200 MG tablet Take 1 tablet (200 mg total) by mouth every 6 (six) hours as needed for cough or to loosen phlegm. 60 suppository 0  . metoprolol tartrate (LOPRESSOR) 25 MG tablet Take 0.5 tablets (12.5 mg total) by mouth 2 (two) times daily. 30 tablet 0  . nicotine (NICODERM CQ - DOSED IN MG/24 HOURS) 21 mg/24hr patch Place 1 patch (21 mg total) onto the skin daily. 28 patch 0  . nitroGLYCERIN (NITROSTAT) 0.4 MG SL tablet PLACE 1 TABLET UNDER THE TONGUE EVERY 5 MINUTES AS NEEDED FOR CHEST PAIN UP TO 3 DOSES, IF SYMPTOMS PERSIST CALL 911 25 tablet 6  . sodium chloride (OCEAN) 0.65 % SOLN nasal spray Place 1 spray into both nostrils 2 (two) times daily at 8 am and 10 pm.  0  . Evolocumab (REPATHA SURECLICK) 811 MG/ML SOAJ Inject 140 mg into the skin every 14 (fourteen) days. 2 mL 11   No  current  facility-administered medications for this visit.    Social History   Socioeconomic History  . Marital status: Married    Spouse name: Not on file  . Number of children: Not on file  . Years of education: Not on file  . Highest education level: Not on file  Occupational History  . Not on file  Tobacco Use  . Smoking status: Former Smoker    Packs/day: 1.00    Years: 53.00    Pack years: 53.00    Types: Cigarettes    Quit date: 10/14/2020    Years since quitting: 0.1  . Smokeless tobacco: Never Used  Vaping Use  . Vaping Use: Never used  Substance and Sexual Activity  . Alcohol use: Yes    Alcohol/week: 5.0 standard drinks    Types: 5 Standard drinks or equivalent per week    Comment: consumes 1 pint per day   . Drug use: No  . Sexual activity: Yes  Other Topics Concern  . 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 Has one grandchild. He works in Education officer, museum and remains busy. He still smoking. He does use occasional alcohol.  Family history is notable that his father died with heart disease at age 47.  ROS General: Negative; No fevers, chills, or night sweats;  HEENT: Negative; No changes in vision or hearing, sinus congestion, difficulty swallowing Pulmonary:  Mild shortness of breath, no hemoptysis Cardiovascular: See HPI GI: Negative; No nausea, vomiting, diarrhea, or abdominal pain GU: Negative; No dysuria, hematuria, or difficulty voiding Musculoskeletal: Negative; no myalgias, joint pain, or weakness Hematologic/Oncology: Negative; no easy bruising,  bleeding Endocrine: Negative; no heat/cold intolerance; no diabetes Neuro: Recent postoperative small left cerebellar hemisphere stroke; no changes in balance, headaches Skin: Negative; No rashes or skin lesions Psychiatric: Negative; No behavioral problems, depression Sleep: Negative; No snoring, daytime sleepiness, hypersomnolence, bruxism, restless legs, hypnogognic hallucinations, no cataplexy Other comprehensive 14 point system review is negative.   PE BP 132/70 (BP Location: Left Arm, Patient Position: Sitting)   Pulse (!) 54   Ht 5' 8"  (1.727 m)   Wt 215 lb 6.4 oz (97.7 kg)   SpO2 93%   BMI 32.75 kg/m    Repeat blood pressure by me was 124/70  Wt Readings from Last 3 Encounters:  12/02/20 215 lb 6.4 oz (97.7 kg)  11/16/20 202 lb (91.6 kg)  11/04/20 208 lb 8.9 oz (94.6 kg)     General: Alert, oriented, no distress.  Skin: normal turgor, no rashes, warm and dry HEENT: Normocephalic, atraumatic. Pupils equal round and reactive to light; sclera anicteric; extraocular muscles intact; Nose without nasal septal hypertrophy Mouth/Parynx benign; Mallinpatti scale 3 Neck: No JVD, no carotid bruits; normal carotid upstroke Lungs: clear to ausculatation and percussion; no wheezing or rales Chest wall: without tenderness to palpitation Heart: PMI not displaced, RRR, s1 s2 normal, 1/6 systolic murmur, no diastolic murmur, no rubs, gallops, thrills, or heaves Abdomen: soft, nontender; no hepatosplenomehaly, BS+; abdominal aorta nontender and not dilated by palpation.  Back: no CVA tenderness Pulses 2+ Musculoskeletal: full range of motion, normal strength, no joint deformities Extremities: Trace edema;  no clubbing cyanosis or edema, Homan's sign negative  Neurologic: grossly nonfocal; Cranial nerves grossly wnl Psychologic: Normal mood and affect   ECG (independently read by me): Sinus bradycardoia at 54; no ectopy  October 07, 2020 ECG (independently read by me): Sinus  Bradycardia at 59; No significant ST changes  August 03, 2020 ECG (independently read by me): Normal sinus rhythm at 63 bpm.  Nonspecific ST changes.  Normal intervals.  No ectopy  February 12, 2020 ECG (independently read by me): Sinus bradycardia at 50 bpm.  PR interval 192 ms.  QTc interval 417 ms.  No significant ST-T changes.  No ectopy.  November 05, 2019 ECG (independently read by me): Sinus rhythm with occasional to frequent PVCs with VA retrograde P waves  January 2019 ECG (independently read by me): Sinus bradycardia 59 bpm.  No ectopy.  Normal intervals.  April 2018 ECG (independently read by me): Normal sinus rhythm at 61 bpm.  No ectopy.  Normal intervals.  April 2017 ECG (independently read by me): Normal sinus rhythm with an occasional PVC with retrograde P wave.  November 2015 ECG (independently read by me): Sinus bradycardia 52 bpm  Prior ECG: Sinus rhythm at 61 beats per minute. No ectopy. Normal intervals.  LABS:  BMP Latest Ref Rng & Units 11/19/2020 11/02/2020 10/26/2020  Glucose 65 - 99 mg/dL 88 96 98  BUN 8 - 27 mg/dL 10 13 32(H)  Creatinine 0.76 - 1.27 mg/dL 0.96 1.17 1.06  BUN/Creat Ratio 10 - 24 10 - -  Sodium 134 - 144 mmol/L 140 135 142  Potassium 3.5 - 5.2 mmol/L 4.8 3.9 4.3  Chloride 96 - 106 mmol/L 100 98 98  CO2 20 - 29 mmol/L 23 28 30   Calcium 8.6 - 10.2 mg/dL 9.1 8.3(L) 8.6(L)   Hepatic Function Latest Ref Rng & Units 11/19/2020 10/26/2020 10/15/2020  Total Protein 6.0 - 8.5 g/dL 6.9 6.3(L) 5.6(L)  Albumin 3.7 - 4.7 g/dL 3.8 2.7(L) 2.9(L)  AST 0 - 40 IU/L 11 30 19   ALT 0 - 44 IU/L 9 28 13   Alk Phosphatase 44 - 121 IU/L 89 44 40  Total Bilirubin 0.0 - 1.2 mg/dL 0.3 0.9 0.7  Bilirubin, Direct 0.0 - 0.2 mg/dL - - -   CBC Latest Ref Rng & Units 11/19/2020 11/02/2020 10/26/2020  WBC 3.4 - 10.8 x10E3/uL 9.5 7.8 11.5(H)  Hemoglobin 13.0 - 17.7 g/dL 12.0(L) 9.3(L) 10.3(L)  Hematocrit 37.5 - 51.0 % 36.9(L) 29.9(L) 32.6(L)  Platelets 150 - 450 x10E3/uL 316  533(H) 414(H)    Lab Results  Component Value Date   MCV 94 11/19/2020   MCV 100.7 (H) 11/02/2020   MCV 101.2 (H) 10/26/2020   Lab Results  Component Value Date   TSH 1.800 11/19/2020     Lipid Panel     Component Value Date/Time   CHOL 187 11/19/2020 1100   TRIG 177 (H) 11/19/2020 1100   HDL 44 11/19/2020 1100   CHOLHDL 4.3 11/19/2020 1100   CHOLHDL 3.2 10/20/2020 1424   VLDL 24 10/20/2020 1424   LDLCALC 112 (H) 11/19/2020 1100    RADIOLOGY: No results found.  IMPRESSION:  1. CAD in native artery   2. S/P CABG x 4   3. Essential hypertension   4. Medication management   5. Cerebellar stroke (H[facility])   6. Statin intolerance   7. Long history of Tobacco abuse:  quit with CABG      ASSESSMENT AND PLAN: Jonathan [name] is a 35 -year-old gentleman who is 16 1/2 years since his inferior wall myocardial infarction and emergent catheterization in May 2005 at which time he underwent successful intervention to a totally occluded mid RCA with insertion of tandem drug-eluting Cypher stents.  He had concomitant CAD involving his LAD and diagonal vessel. He developed CPK and LFT elevation with statin therapy also stopped taking all of her lipid-lowering treatment.  He  continued to smoke cigarettes.  He was hospitalized in November 2020 when he presented with non-ST segment elevation MI.  He was found to have severe multivessel CAD for which CABG revascularization surgery was recommended.  He was seen by Dr. Arvid Right. Preoperative carotid Doppler imaging showed mild 1 to 39% right ICA stenosis with left ICA velocities in the 40 to 59% range.  However, he ultimately refused the opportunity for bypass.  He states his father had a stroke during a bypass procedure and he did not want this to happen to him.  After refusing surgery for over a year and a half and requiring multiple medication adjustments with multiple new medications and maximal therapy due to ongoing daily anginal  symptomatology ultimately decided to pursue CABG revascularization surgery.  I again reviewed the catheterization findings with him which had demonstrated progressive disease but fortunately LV function remained fairly stable.  He underwent successful CABG revascularization surgery with utilization of bilateral internal mammary conduits to his LAD and RCA with vein graft to his diagonal and marginal vessel.  He did develop a probable small cerebellar infarct postoperatively but fortunately has stabilized and following his hospitalization completed a 10-day stay in inpatient rehabilitation.  He is now off amlodipine, ranolazine, isosorbide, ramipril and furosemide.  His blood pressure today is stable and on repeat by me 124/70.  Previously we had gotten him on Repatha for lipid lowering therapy.  In the office today I provided him with a 140 mg sample of Repatha and we reinitiated the process for him to continue to receive drug with every 2 week injections.  He continues to be on aspirin and Plavix.  He is on low-dose metoprolol tartrate with pulse stable in the mid 50s and blood pressure stability.  He is no longer smoking but is on a nicotine patch will need to be tapered and weaned I will recheck comprehensive metabolic panel, lipid studies and CBC in approximately 2 to 3 months with reinitiation of Repatha and see him in 3 months for follow-up evaluation  [name], MD, [facility]  12/08/2020 6:29 PM

## 2020-12-08 ENCOUNTER — Encounter: Payer: Self-pay | Admitting: Cardiovascular Disease

## 2021-03-11 ENCOUNTER — Ambulatory Visit: Payer: Medicare Other | Admitting: Cardiovascular Disease

## 2021-03-11 ENCOUNTER — Other Ambulatory Visit: Payer: Self-pay

## 2021-03-11 ENCOUNTER — Encounter: Payer: Self-pay | Admitting: Cardiovascular Disease

## 2021-03-11 DIAGNOSIS — Z951 Presence of aortocoronary bypass graft: Secondary | ICD-10-CM

## 2021-03-11 DIAGNOSIS — J449 Chronic obstructive pulmonary disease, unspecified: Secondary | ICD-10-CM

## 2021-03-11 DIAGNOSIS — E785 Hyperlipidemia, unspecified: Secondary | ICD-10-CM

## 2021-03-11 DIAGNOSIS — R0789 Other chest pain: Secondary | ICD-10-CM

## 2021-03-11 DIAGNOSIS — I2510 Atherosclerotic heart disease of native coronary artery without angina pectoris: Secondary | ICD-10-CM

## 2021-03-11 DIAGNOSIS — Z789 Other specified health status: Secondary | ICD-10-CM

## 2021-03-11 DIAGNOSIS — Z720 Tobacco use: Secondary | ICD-10-CM | POA: Diagnosis not present

## 2021-03-11 DIAGNOSIS — I639 Cerebral infarction, unspecified: Secondary | ICD-10-CM

## 2021-03-11 MED ORDER — ALBUTEROL SULFATE HFA 108 (90 BASE) MCG/ACT IN AERS: 1.0000 | INHALATION_SPRAY | Freq: Four times a day (QID) | RESPIRATORY_TRACT | 2 refills | Status: DC | PRN

## 2021-03-11 MED ORDER — BUDESONIDE-FORMOTEROL FUMARATE 160-4.5 MCG/ACT IN AERO: 1.0000 | INHALATION_SPRAY | Freq: Two times a day (BID) | RESPIRATORY_TRACT | 11 refills | Status: DC

## 2021-03-11 NOTE — Patient Instructions (Signed)
Medication Instructions:  Take Symbicort 1 puff two times daily.  BEGIN albuterol as needed for wheezing. Take 1-2 puffs as needed every 6 hours.   *If you need a refill on your cardiac medications before your next appointment, please call your pharmacy*   Lab Work: None ordered.  If you have labs (blood work) drawn today and your tests are completely normal, you will receive your results only by: [place] (if you have MyChart) OR A paper copy in the mail If you have any lab test that is abnormal or we need to change your treatment, we will call you to review the results.   Testing/Procedures: None ordered.    Follow-Up: At [facility], you and your health needs are our priority.  As part of our continuing mission to provide you with exceptional heart care, we have created designated Provider Care Teams.  These Care Teams include your primary Cardiologist (physician) and Advanced Practice Providers (APPs -  Physician Assistants and Nurse Practitioners) who all work together to provide you with the care you need, when you need it.  We recommend signing up for the patient portal called "MyChart".  Sign up information is provided on this After Visit Summary.  MyChart is used to connect with patients for Virtual Visits (Telemedicine).  Patients are able to view lab/test results, encounter notes, upcoming appointments, etc.  Non-urgent messages can be sent to your provider as well.   To learn more about what you can do with MyChart, go to [email or web address].    Your next appointment:   6 month(s)  The format for your next appointment:   In Person  Provider:   Shelva Majestic, MD

## 2021-03-11 NOTE — Progress Notes (Signed)
Patient ID: Jonathan [name], male   DOB: [birth year], 70 y.o.   MRN: [id number]    Primary M.D.: Dr. Claris Gower  HPI: Jonathan [name] is a 70 y.o. male who presents to the office today for a 3 follow-up cardiology evaluation.  Jonathan [name] has a history of coronary artery disease and on 01/11/2004 suffered an inferior wall myocardial infarction. Cardiac catheterization revealed total occlusion of the RCA and underwent successful intervention with ultimate insertion of a 3.0x33 mm and 3.0x13 mm drug-eluting Cipher stent with restoration of TIMI-3 flow. Additional problems include hyperlipidemia and ongoing tobacco use.  He admits to smoking at least one pack of cigarettes per day. He has a history of hypertension and has been on ramipril 10 mg in addition to Toprol-XL 25 mg.  He's been maintained on dual antiplatelet therapy with aspirin and Plavix.  He also takes Advair Diskus for his lung disease.  He has had intolerance to statins secondary to increased LFTs as well as CPK elevation. He had been on WelChol, fenofibrate, Zetia but apparently has not taking any lipid lowering therapy presently. His last nuclear perfusion study in March 2013 was unchanged from prior studies and did not show any evidence for scar or ischemia with only minimal residual basilar inferior thinning.  There was significant myocardial salvage from his successful reperfusion in the setting of his MI.Ejection fraction was 57%.  He sees Dr. Claris Gower, for primary care.  Laboratory by Dr. Arelia Sneddon in 07/15/2014 showed normal renal function with a BUN of 11,Cr0.98.  His lipids were markedly abnormal with a total cholesterol of 270, triglycerides of 368, VLDL of 74, and LDL cholesterol of 152.  HDL cholesterol was 44. Hemoglobin C was 5.6.  When I saw him in April 2018, he denied any episodes of chest pain or palpitations.  Unfortunately, he was continuing to smoke and had started smoking at age 77.  He has COPD.  I last saw him in the  office in January 2019.  Unfortunately he continues to smoke.  He has no interest in quitting.  He has been tried on multiple lipid-lowering agents develop increased LFTs and CPK elevation on statins.  In addition, I've tried WelChol, fenofibrate, and Zetia.  I last saw him, I discussed the possibility of Repatha.  He had undergone repeat laboratory in July 2018, which continue to show a total cholesterol 238, triglycerides 308, HDL 47, VLDL 62, and LDL cholesterol at 129.    Jonathan [name] was admitted to [facility] July 30, 2019 with a non-ST segment elevation MI.  At that time, I performed repeat cardiac catheterization after he consented to changes DNR status for the catheterization.  This revealed significant multivessel CAD with 90% diffuse proximal stenosis in the first diagonal branch of the LAD with 70% LAD stenosis beyond the diagonal takeoff.  There was 95% ostial left circumflex stenosis the vessel immediately bifurcating into the AV groove and circumflex.  The RCA had 40% the previously placed proximal stent with focal 80% mid distal in-stent restenosis and new 70% stenosis beyond the stented segment proximal to the acute margin large dominant RCA.  An echo Doppler study revealed EF of 55 to 60% without LVH.  ``Following the catheterization we discussed consideration for possible CABG revascularization surgery and obtain surgical consultation.  Apparently, the patient refused to consider CABG revascularization ultimately was subsequently discharged.  He has  been [name] by Roby Lofts, PA-C in the office on August 02, 2019.  He again stated  his desire not to have surgery.  I saw him for his post hospital evaluation with me on November 05, 2019.  At that time he admitted to experiencing almost daily episodes of angina for several months.  He was not very active.  He also admits to exertional dyspnea.  He  reduced his 1 pack/day tobacco habit down to 1/2 pack/day.  He has been smoking for over  60 years.  During his hospitalization, he had marked hyperlipidemia with total cholesterol 288, LDL cholesterol 196.  He was ultimately approved for Repatha and received his first PCSK9 inhibitor injection on October 28, 2019.  During that evaluation, I recommended the further titration of isosorbide mononitrate to 60 mg and increase Toprol-XL to 37.5 mg from his present dose of 25 mg.  I also recommended the addition of amlodipine 2.5 mg for the first week and then as long as his blood pressure is doing well he could further increase this to 5 mg.  He did not feel the 2.5 mg dose was effective, never titrated it and ultimately stopped taking it.    I saw him in June 2021 at which time he was continuing to experience frequent anginal symptoms with activity.  Unfortunately he was still smoking half pack of cigarettes per day.  He had received 8 Repatha injections over the previous several months.  During that evaluation, I again discussed potential future reconsideration of CABG revascularization.  During that evaluation, his blood pressure was stable but on the low side.  He had never titrated amlodipine from 2.5 mg and felt it was not necessary so therefore stopped taking it.  During that evaluation I recommended initiation of ranolazine at 500 mg twice a day with further plans to titrate.  When I saw him on 08/03/2020 he walked into the office today complaining of ongoing chest pain. Immediately upon coming into the room, he was placed on a stretcher but states he could not lie flat so therefore he was somewhat upright.  He was given sublingual nitroglycerin.  His chest pain promptly relieved.  ECG was taken which did not reveal any acute ischemic changes.  He states he typically experiences chest pain when he wakes up and then typically has another episode of chest pain approximately 4 PM in the afternoon and this occurs almost on a daily basis.  He has been on a regimen of amlodipine which I had titrated to  5 mg at his last visit, aspirin/Plavix, isosorbide 60 mg in the morning, metoprolol succinate 37.5 mg daily, ramipril 10 mg, and Ranexa 500 mg twice a day.  He continues to receive Repatha injections 140 mg every 2 weeks.  During that evaluation, I recommended further titration of his anti-ischemic medications and increased his isosorbide to 90 mg in the morning and recommended a 30 mg at night and increases Ranexa to 1000 mg twice a day.  That evaluation I again discussed reconsideration for CABG revascularization.  He left the office stating that he will contemplate this and may want to pursue this after the new year.  I last saw him on October 07, 2020.  His chest pain did improve with increased medical therapy but he continued to experience recurrent chest pain symptomatology with very frequent sublingual nitroglycerin use.  He presented to the emergency room on 09/17/2019.  His ECG was stable.  His amlodipine dose was increased to 10 mg.  On his increased dose he has noticed lower extremity and ankle swelling.  At his January 2022  office visit, he was continuing to have  daily early morning chest pain and was taking approximately 10-15 sublingual nitroglycerin per week.  He presented for evaluation and at that time had decided to pursue CABG revascularization  I performed cardiac catheterization on October 14, 2020 which revealed severe multivessel progressive CAD with 40 to 50% ostial left main stenosis, diffuse 95% stenosis in the first diagonal branch of the LAD with 70% LAD stenosis beyond the diagonal vessel; 99% left circumflex stenosis and progressive 85% in-stent restenosis in a dominant RCA with 90% stenosis at the initiation of the proximal stent with 70% stenosis proximal to the AV groove.  LV function remains relatively preserved with EF estimate at 50 to 55%.  Plavix had been washed out prior to the procedure.  The following day he underwent successful CABG x 4 revascularization surgery by Dr.  Orvan [name] with a LIMA to LAD, pedicled RIMA to the right posterior lateral coronary artery, SVG to the OM1 branch of the circumflex, and SVG to the first diagonal branch.  His course was complicated by postoperative small stroke with small infarct in the left posterior cerebellar hemisphere.  He was evaluated by neurology.  RA showed patchy small volume early subacute ischemic infarcts involving the left cerebellar hemisphere.  He ultimately was discharged and spent 10 days in inpatient rehab.  He saw Dr. Orvan [name] for office follow-up evaluation on November 16, 2020 and at that time remained stable from a cardiovascular surgical standpoint.  I saw him for initial post CABG office visit follow-up on December 02, 2020.  At that time he was feeling well and denied any recurrent anginal symptomatology.  He was no longer taking amlodipine, ranolazine, isosorbide, ramipril therapy.  He apparently had stopped his Repatha for approximately 3 months this will need to be resumed.  He denies recurrent anginal symptoms.  He continues to be on aspirin and Plavix, metoprolol 12.5 mg twice a day.  He was using a nicotine patch in place of his tobacco.  During that evaluation, I provided him with samples of Repatha and reinitiated his approval.  Since I last saw him, he underwent follow-up laboratory by Dr. Claris Gower as part of his wellness exam.  Lipid studies dramatically improved with total cholesterol 187 down to 122, triglycerides 177 down to 94, LDL cholesterol 112 down to 44, and HDL cholesterol increasing from 44 up to 60.  He continues to experience anterior chest wall discomfort.  He has COPD.  He has been wheezing.  He has only been taking his prescribed Symbicort perhaps 1-2 times per week rather than twice a day.  He does not have any as needed albuterol.  He denies any recurrent anginal symptoms.  He is now using Nicorette gum in place of nicotine patch and is no longer smoking.  He presents for reevaluation.  Past  Medical History:  Diagnosis Date   Arthritis    hands    Chronic bronchitis (HCC)    Chronic sinusitis of both maxillary sinuses 04/16/2018   Sinus CT 04/25/2018 Mild mucoperiosteal thickening involving the frontoethmoidal sinuses. Otherwise largely clear sinuses with no evidence for acute sinusitis.    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 (HCC)    DOE (dyspnea on exertion) 04/16/2018   Essential hypertension, benign 09/23/2012   Changed acei to arb 04/16/2018 due to pseudowheeze > improved 06/08/2018    Hernia, inguinal 09/23/2012   Hyperlipidemia    has not tolerated statins,  would not pursue PSK9 inhibitor as advised by Dr Claiborne Billings   Hypertension    Myocardial infarction [facility])    NSTEMI (non-ST elevated myocardial infarction) ([place]) 09/23/2012   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   Tobacco abuse     Past Surgical History:  Procedure Laterality Date   CARDIAC CATHETERIZATION     CARDIOVASCULAR STRESS TEST  11/11/2011   CORONARY ANGIOPLASTY     stents- 01/2004    CORONARY ARTERY BYPASS GRAFT N/A 10/15/2020   Procedure: CORONARY ARTERY BYPASS GRAFTING (CABG) TIMES FOUR USING BILATERAL INTERNAL MAMMARY ARTERIES AND ENDOSCOPICALLY HARVESTED GREATER SAPHENOUS VEIN AND INSERTION OF 50CC INTRAAORTIC BALLOON PUMP;  Surgeon: [name], MD;  Location: [place];  Service: Open Heart Surgery;  Laterality: N/A;  possible BIMA   ENDOVEIN HARVEST OF GREATER SAPHENOUS VEIN Right 10/15/2020    Procedure: ENDOVEIN HARVEST OF GREATER SAPHENOUS VEIN;  Surgeon: [name], MD;  Location: [place];  Service: Open Heart Surgery;  Laterality: Right;   heart stents  01/2004   INGUINAL HERNIA REPAIR Right 12/06/2012   Procedure: HERNIA REPAIR INGUINAL ADULT;  Surgeon: [name], DO;  Location: [facility];  Service: General;  Laterality: Right;   INSERTION OF MESH Right 12/06/2012   Procedure: INSERTION OF MESH;  Surgeon: [name], DO;  Location: [facility];  Service: General;  Laterality: Right;   LEFT HEART CATH AND CORONARY ANGIOGRAPHY N/A 07/22/2019   Procedure: LEFT HEART CATH AND CORONARY ANGIOGRAPHY;  Surgeon: [name], MD;  Location: [place] [facility];  Service: Cardiovascular;  Laterality: N/A;   LEFT HEART CATH AND CORONARY ANGIOGRAPHY N/A 10/14/2020   Procedure: LEFT HEART CATH AND CORONARY ANGIOGRAPHY;  Surgeon: [name], MD;  Location: [place] [facility];  Service: Cardiovascular;  Laterality: N/A;   TEE WITHOUT CARDIOVERSION N/A 10/15/2020   Procedure: TRANSESOPHAGEAL ECHOCARDIOGRAM (TEE);  Surgeon: [name], MD;  Location: [place];  Service: Open Heart Surgery;  Laterality: N/A;    Allergies  Allergen Reactions   Niaspan [Niacin Er] Itching and Other (See Comments)    insomnia   Statins     LFT elevavation, CK elevevation    Current Outpatient Medications  Medication Sig Dispense Refill   acetaminophen (TYLENOL) 325 MG tablet Take 1-2 tablets (325-650 mg total) by mouth every 4 (four) hours as needed for mild pain.     albuterol (VENTOLIN HFA) 108 (90 Base) MCG/ACT inhaler Inhale 1-2 puffs into the lungs every 6 (six) hours as needed for wheezing or shortness of breath. 8 g 2   aspirin 81 MG chewable tablet Chew 1 tablet (81 mg total) by mouth daily.     clopidogrel (PLAVIX) 75 MG tablet Take 1 tablet (75 mg total) by mouth daily. 30 tablet 0   Evolocumab (REPATHA SURECLICK) 010 MG/ML SOAJ Inject 140 mg into the skin every 14 (fourteen) days. 2  mL 11    guaiFENesin 200 MG tablet Take 1 tablet (200 mg total) by mouth every 6 (six) hours as needed for cough or to loosen phlegm. 60 suppository 0   metoprolol succinate (TOPROL-XL) 25 MG 24 hr tablet Take 25 mg by mouth daily.     nicotine (NICODERM CQ - DOSED IN MG/24 HOURS) 21 mg/24hr patch Place 1 patch (21 mg total) onto the skin daily. 28 patch 0   nicotine polacrilex (NICORETTE) 2 MG gum Take 2 mg by mouth as needed for smoking cessation.     nitroGLYCERIN (NITROSTAT) 0.4 MG SL tablet PLACE 1 TABLET UNDER THE TONGUE EVERY 5 MINUTES AS NEEDED FOR CHEST PAIN UP TO 3 DOSES, IF SYMPTOMS PERSIST CALL 911 25 tablet 6   sodium chloride (OCEAN) 0.65 % SOLN nasal spray Place 1 spray into both nostrils 2 (two) times daily at 8 am and 10 pm.  0   budesonide-formoterol (SYMBICORT) 160-4.5 MCG/ACT inhaler Inhale 1 puff into the lungs 2 (two) times daily. 1 each 11   No current facility-administered medications for this visit.    Social History   Socioeconomic History   Marital status: Married    Spouse name: Not on file   Number of children: Not on file   Years of education: Not on file   Highest education level: Not on file  Occupational History   Not on file  Tobacco Use   Smoking status: Former    Packs/day: 1.00    Years: 53.00    Pack years: 53.00    Types: Cigarettes    Quit date: 10/14/2020    Years since quitting: 0.4   Smokeless tobacco: Never  Vaping Use   Vaping Use: Never used  Substance and Sexual Activity   Alcohol use: Yes    Alcohol/week: 5.0 standard drinks    Types: 5 Standard drinks or equivalent per week    Comment: consumes 1 pint per day    Drug use: No   Sexual activity: Yes  Other Topics Concern   Not on file  Social History Narrative   Retired   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 Has one grandchild. He works in Education officer, museum and remains busy. He still smoking. He does use occasional alcohol.  Family history is notable that his father died with heart disease at age 61.  ROS General: Negative; No fevers, chills, or night sweats;  HEENT: Negative; No changes in vision or hearing, sinus congestion, difficulty swallowing Pulmonary:  Mild shortness of breath, no hemoptysis Cardiovascular: See HPI GI: Negative; No nausea, vomiting, diarrhea, or abdominal pain GU: Negative; No dysuria, hematuria, or difficulty voiding Musculoskeletal: Negative; no myalgias, joint pain, or weakness Hematologic/Oncology: Negative; no easy bruising, bleeding Endocrine: Negative; no heat/cold intolerance; no diabetes Neuro: Recent postoperative small left cerebellar hemisphere stroke; no changes in balance, headaches Skin: Negative; No rashes or skin lesions Psychiatric: Negative; No behavioral problems, depression Sleep: Negative; No snoring, daytime sleepiness, hypersomnolence, bruxism, restless legs, hypnogognic hallucinations, no cataplexy Other comprehensive 14 point system review is negative.   PE BP 140/77  Pulse 65   Ht 5' 8"  (1.727 m)   Wt 206 lb 9.6 oz (93.7 kg)   SpO2 90%   BMI 31.41 kg/m    Repeat blood pressure by me was 122/70  Wt Readings from Last 3 Encounters:  03/11/21 206 lb 9.6 oz (93.7 kg)  12/02/20 215 lb 6.4 oz (97.7 kg)  11/16/20 202 lb (91.6 kg)   General: Alert, oriented, no distress.  Skin: normal turgor, no rashes, warm and dry HEENT: Normocephalic, atraumatic. Pupils equal round and reactive to light; sclera anicteric; extraocular muscles intact; Nose without nasal septal hypertrophy Mouth/Parynx benign; Mallinpatti scale 3 Neck: No JVD, no carotid bruits; normal  carotid upstroke Lungs: Occasional rhonchi with inspiratory wheezing Chest wall: Mild chest wall tenderness to palpation Heart: PMI not displaced, RRR, s1 s2 normal, 1/6 systolic murmur, no diastolic murmur, no rubs, gallops, thrills, or heaves Abdomen: soft, nontender; no hepatosplenomehaly, BS+; abdominal aorta nontender and not dilated by palpation. Back: no CVA tenderness Pulses 2+ Musculoskeletal: full range of motion, normal strength, no joint deformities Extremities: no clubbing cyanosis or edema, Homan's sign negative  Neurologic: grossly nonfocal; Cranial nerves grossly wnl Psychologic: Normal mood and affect   ECG (independently read by me): NSR at 65; No STT changes  March 223, 2022 ECG (independently read by me): Sinus bradycardoia at 54; no ectopy  October 07, 2020 ECG (independently read by me): Sinus Bradycardia at 59; No significant ST changes  August 03, 2020 ECG (independently read by me): Normal sinus rhythm at 63 bpm.  Nonspecific ST changes.  Normal intervals.  No ectopy  February 12, 2020 ECG (independently read by me): Sinus bradycardia at 50 bpm.  PR interval 192 ms.  QTc interval 417 ms.  No significant ST-T changes.  No ectopy.  November 05, 2019 ECG (independently read by me): Sinus rhythm with occasional to frequent PVCs with VA retrograde P waves  January 2019 ECG (independently read by me): Sinus bradycardia 59 bpm.  No ectopy.  Normal intervals.  April 2018 ECG (independently read by me): Normal sinus rhythm at 61 bpm.  No ectopy.  Normal intervals.  April 2017 ECG (independently read by me): Normal sinus rhythm with an occasional PVC with retrograde P wave.  November 2015 ECG (independently read by me): Sinus bradycardia 52 bpm  Prior ECG: Sinus rhythm at 61 beats per minute. No ectopy. Normal intervals.  LABS:  BMP Latest Ref Rng & Units 11/19/2020 11/02/2020 10/26/2020  Glucose 65 - 99 mg/dL 88 96 98  BUN 8 - 27 mg/dL 10 13 32(H)  Creatinine 0.76 -  1.27 mg/dL 0.96 1.17 1.06  BUN/Creat Ratio 10 - 24 10 - -  Sodium 134 - 144 mmol/L 140 135 142  Potassium 3.5 - 5.2 mmol/L 4.8 3.9 4.3  Chloride 96 - 106 mmol/L 100 98 98  CO2 20 - 29 mmol/L 23 28 30   Calcium 8.6 - 10.2 mg/dL 9.1 8.3(L) 8.6(L)   Hepatic Function Latest Ref Rng & Units 11/19/2020 10/26/2020 10/15/2020  Total Protein 6.0 - 8.5 g/dL 6.9 6.3(L) 5.6(L)  Albumin 3.7 - 4.7 g/dL 3.8 2.7(L) 2.9(L)  AST 0 - 40 IU/L 11 30 19   ALT 0 - 44 IU/L 9 28 13   Alk Phosphatase 44 - 121 IU/L 89 44 40  Total Bilirubin 0.0 - 1.2 mg/dL 0.3 0.9 0.7  Bilirubin, Direct 0.0 - 0.2 mg/dL - - -   CBC Latest Ref Rng & Units 11/19/2020 11/02/2020 10/26/2020  WBC 3.4 - 10.8 x10E3/uL 9.5  7.8 11.5(H)  Hemoglobin 13.0 - 17.7 g/dL 12.0(L) 9.3(L) 10.3(L)  Hematocrit 37.5 - 51.0 % 36.9(L) 29.9(L) 32.6(L)  Platelets 150 - 450 x10E3/uL 316 533(H) 414(H)    Lab Results  Component Value Date   MCV 94 11/19/2020   MCV 100.7 (H) 11/02/2020   MCV 101.2 (H) 10/26/2020   Lab Results  Component Value Date   TSH 1.800 11/19/2020     Lipid Panel     Component Value Date/Time   CHOL 187 11/19/2020 1100   TRIG 177 (H) 11/19/2020 1100   HDL 44 11/19/2020 1100   CHOLHDL 4.3 11/19/2020 1100   CHOLHDL 3.2 10/20/2020 1424   VLDL 24 10/20/2020 1424   LDLCALC 112 (H) 11/19/2020 1100    RADIOLOGY: No results found.  IMPRESSION:  1. CAD in native artery   2. S/P CABG x 4   3. Long history of Tobacco abuse: quit with CABG   4. Statin intolerance   5. Hyperlipidemia with target LDL less than 70   6. Chest wall discomfort   7. Cerebellar stroke ([place])   8. Chronic obstructive pulmonary disease, unspecified COPD type [facility])     ASSESSMENT AND PLAN: Jonathan [name] is a 70 year-old gentleman who is 17 years since his inferior wall myocardial infarction and emergent catheterization in May 2005 at which time he underwent successful intervention to a totally occluded mid RCA with insertion of tandem drug-eluting  Cypher stents.  He had concomitant CAD involving his LAD and diagonal vessel. He developed CPK and LFT elevation with statin therapy also stopped taking all of her lipid-lowering treatment.  He  continued to smoke cigarettes.  He was hospitalized in November 2020 when he presented with non-ST segment elevation MI.  He was found to have severe multivessel CAD for which CABG revascularization surgery was recommended.  He was [name] by Dr. Gilford Raid. Preoperative carotid Doppler imaging showed mild 1 to 39% right ICA stenosis with left ICA velocities in the 40 to 59% range.  However, he ultimately refused the opportunity for bypass.  He states his father had a stroke during a bypass procedure and he did not want this to happen to him.  After refusing surgery for over a year and a half and requiring multiple medication adjustments with multiple new medications and maximal therapy due to ongoing daily anginal symptomatology ultimately decided to pursue CABG revascularization surgery.  Repeat catheterization  demonstrated progressive disease but fortunately LV function remained fairly stable.  He underwent successful CABG revascularization surgery with utilization of bilateral internal mammary conduits to his LAD and RCA with vein graft to his diagonal and marginal vessel.  He did develop a probable small cerebellar infarct postoperatively but fortunately has stabilized and following his hospitalization completed a 10-day stay in inpatient rehabilitation.  He continues to be of now off amlodipine, ranolazine, isosorbide, ramipril and furosemide.  He is now back on Repatha.  Most recent lipid studies are excellent with LDL cholesterol now at 44.  He has COPD due to his longstanding tobacco history.  He has wheezing on exam.  I have recommended he take his Symbicort rather than 1-2 times per week but to take at least 1 puff twice a day until his wheezing resolves.  I am also giving a prescription for albuterol to take on a  as needed basis.  He is no longer inhaling tobacco but uses Nicorette gum.  He is not having any anginal symptoms.  His blood pressure is stable and he  is on low-dose metoprolol succinate at just 25 mg.  He continues to be on aspirin and Plavix for long-term DAPT.  I reviewed his recent laboratory from Dr. Claris Gower.  I will see him in 6 months for cardiology reevaluation or sooner as needed.   [name], MD, [facility]  03/11/2021 4:18 PM

## 2021-04-29 ENCOUNTER — Other Ambulatory Visit: Payer: Self-pay | Admitting: Cardiovascular Disease

## 2021-06-30 ENCOUNTER — Other Ambulatory Visit: Payer: Self-pay | Admitting: Cardiovascular Disease

## 2021-09-10 ENCOUNTER — Other Ambulatory Visit: Payer: Self-pay | Admitting: Internal Medicine

## 2021-09-10 NOTE — Telephone Encounter (Signed)
I'm going to send refill, if not affordable we can send in [facility] 260mcg two puffs twice daily and remove symbicort

## 2021-09-23 ENCOUNTER — Encounter: Payer: Self-pay | Admitting: Cardiovascular Disease

## 2021-09-23 ENCOUNTER — Other Ambulatory Visit: Payer: Self-pay

## 2021-09-23 ENCOUNTER — Ambulatory Visit: Payer: Medicare Other | Admitting: Cardiovascular Disease

## 2021-09-23 VITALS — BP 138/64 | HR 67 | Ht 68.0 in | Wt 211.4 lb

## 2021-09-23 DIAGNOSIS — Z720 Tobacco use: Secondary | ICD-10-CM

## 2021-09-23 DIAGNOSIS — Z951 Presence of aortocoronary bypass graft: Secondary | ICD-10-CM | POA: Diagnosis not present

## 2021-09-23 DIAGNOSIS — I639 Cerebral infarction, unspecified: Secondary | ICD-10-CM

## 2021-09-23 DIAGNOSIS — J449 Chronic obstructive pulmonary disease, unspecified: Secondary | ICD-10-CM

## 2021-09-23 DIAGNOSIS — E785 Hyperlipidemia, unspecified: Secondary | ICD-10-CM | POA: Diagnosis not present

## 2021-09-23 DIAGNOSIS — Z789 Other specified health status: Secondary | ICD-10-CM | POA: Diagnosis not present

## 2021-09-23 DIAGNOSIS — Z79899 Other long term (current) drug therapy: Secondary | ICD-10-CM

## 2021-09-23 DIAGNOSIS — I2510 Atherosclerotic heart disease of native coronary artery without angina pectoris: Secondary | ICD-10-CM | POA: Diagnosis not present

## 2021-09-23 NOTE — Patient Instructions (Signed)
Medication Instructions:  The current medical regimen is effective;  continue present plan and medications as directed. Please refer to the Current Medication list given to you today.  *If you need a refill on your cardiac medications before your next appointment, please call your pharmacy*  Lab Work: FASTING LIPID, CMET, CBC AND TSH If you have labs (blood work) drawn today and your tests are completely normal, you will receive your results only by:  College (if you have MyChart) OR A paper copy in the mail.  If you have any lab test that is abnormal or we need to change your treatment, we will call you to review the results. You may go to any [facility] that is convenient for you however, we do have a lab in our office that is able to assist you. You DO NOT need an appointment for our lab. The lab is open 8:00am and closes at 4:00pm. Lunch 12:45 - 1:45pm.  Follow-Up: Your next appointment:  6 month(s) In Person with Shelva Majestic, MD   Please call our office 2 months in advance to schedule this appointment  At [facility], you and your health needs are our priority.  As part of our continuing mission to provide you with exceptional heart care, we have created designated Provider Care Teams.  These Care Teams include your primary Cardiologist (physician) and Advanced Practice Providers (APPs -  Physician Assistants and Nurse Practitioners) who all work together to provide you with the care you need, when you need it.

## 2021-09-23 NOTE — Progress Notes (Signed)
Patient ID: Jonathan [name], male   DOB: 1950/11/19, 71 y.o.   MRN: [id number]    Primary M.D.: Dr. Claris [name]  HPI: Jonathan [name] is a 71 y.o. male who presents to the office today for a 7 month follow-up cardiology evaluation.  Jonathan [name] has a history of coronary artery disease and on 01/11/2004 suffered an inferior wall myocardial infarction. Cardiac catheterization revealed total occlusion of the RCA and underwent successful intervention with ultimate insertion of a 3.0x33 mm and 3.0x13 mm drug-eluting Cipher stent with restoration of TIMI-3 flow. Additional problems include hyperlipidemia and ongoing tobacco use.  He admits to smoking at least one pack of cigarettes per day. He has a history of hypertension and has been on ramipril 10 mg in addition to Toprol-XL 25 mg.  He's been maintained on dual antiplatelet therapy with aspirin and Plavix.  He also takes Advair Diskus for his lung disease.  He has had intolerance to statins secondary to increased LFTs as well as CPK elevation. He had been on WelChol, fenofibrate, Zetia but apparently has not taking any lipid lowering therapy presently. His last nuclear perfusion study in March 2013 was unchanged from prior studies and did not show any evidence for scar or ischemia with only minimal residual basilar inferior thinning.  There was significant myocardial salvage from his successful reperfusion in the setting of his MI.Ejection fraction was 57%.  He sees Dr. Claris [name], for primary care.  Laboratory by Dr. Arelia [name] in 07/15/2014 showed normal renal function with a BUN of 11,Cr0.98.  His lipids were markedly abnormal with a total cholesterol of 270, triglycerides of 368, VLDL of 74, and LDL cholesterol of 152.  HDL cholesterol was 44. Hemoglobin C was 5.6.  When I saw him in April 2018, he denied any episodes of chest pain or palpitations.  Unfortunately, he was continuing to smoke and had started smoking at age 68.  He has COPD.  I saw him in the  office in January 2019.  Unfortunately he continues to smoke.  He has no interest in quitting.  He has been tried on multiple lipid-lowering agents develop increased LFTs and CPK elevation on statins.  In addition, I've tried WelChol, fenofibrate, and Zetia.  I last saw him, I discussed the possibility of Repatha.  He had undergone repeat laboratory in July 2018, which continue to show a total cholesterol 238, triglycerides 308, HDL 47, VLDL 62, and LDL cholesterol at 129.    Jonathan [name] was admitted to [facility] July 30, 2019 with a non-ST segment elevation MI.  At that time, I performed repeat cardiac catheterization after he consented to changes DNR status for the catheterization.  This revealed significant multivessel CAD with 90% diffuse proximal stenosis in the first diagonal branch of the LAD with 70% LAD stenosis beyond the diagonal takeoff.  There was 95% ostial left circumflex stenosis the vessel immediately bifurcating into the AV groove and circumflex.  The RCA had 40% the previously placed proximal stent with focal 80% mid distal in-stent restenosis and new 70% stenosis beyond the stented segment proximal to the acute margin large dominant RCA.  An echo Doppler study revealed EF of 55 to 60% without LVH.  ``Following the catheterization we discussed consideration for possible CABG revascularization surgery and obtain surgical consultation.  Apparently, the patient refused to consider CABG revascularization ultimately was subsequently discharged.  He has  been [name], PA-C in the office on August 02, 2019.  He again stated  his desire not to have surgery.  I saw him for his post hospital evaluation with me on November 05, 2019.  At that time he admitted to experiencing almost daily episodes of angina for several months.  He was not very active.  He also admits to exertional dyspnea.  He  reduced his 1 pack/day tobacco habit down to 1/2 pack/day.  He has been smoking for over  60 years.  During his hospitalization, he had marked hyperlipidemia with total cholesterol 288, LDL cholesterol 196.  He was ultimately approved for Repatha and received his first PCSK9 inhibitor injection on October 28, 2019.  During that evaluation, I recommended the further titration of isosorbide mononitrate to 60 mg and increase Toprol-XL to 37.5 mg from his present dose of 25 mg.  I also recommended the addition of amlodipine 2.5 mg for the first week and then as long as his blood pressure is doing well he could further increase this to 5 mg.  He did not feel the 2.5 mg dose was effective, never titrated it and ultimately stopped taking it.    I saw him in June 2021 at which time he was continuing to experience frequent anginal symptoms with activity.  Unfortunately he was still smoking half pack of cigarettes per day.  He had received 8 Repatha injections over the previous several months.  During that evaluation, I again discussed potential future reconsideration of CABG revascularization.  During that evaluation, his blood pressure was stable but on the low side.  He had never titrated amlodipine from 2.5 mg and felt it was not necessary so therefore stopped taking it.  During that evaluation I recommended initiation of ranolazine at 500 mg twice a day with further plans to titrate.  When I saw him on 08/03/2020 he walked into the office today complaining of ongoing chest pain. Immediately upon coming into the room, he was placed on a stretcher but states he could not lie flat so therefore he was somewhat upright.  He was given sublingual nitroglycerin.  His chest pain promptly relieved.  ECG was taken which did not reveal any acute ischemic changes.  He states he typically experiences chest pain when he wakes up and then typically has another episode of chest pain approximately 4 PM in the afternoon and this occurs almost on a daily basis.  He has been on a regimen of amlodipine which I had titrated to  5 mg at his last visit, aspirin/Plavix, isosorbide 60 mg in the morning, metoprolol succinate 37.5 mg daily, ramipril 10 mg, and Ranexa 500 mg twice a day.  He continues to receive Repatha injections 140 mg every 2 weeks.  During that evaluation, I recommended further titration of his anti-ischemic medications and increased his isosorbide to 90 mg in the morning and recommended a 30 mg at night and increases Ranexa to 1000 mg twice a day.  That evaluation I again discussed reconsideration for CABG revascularization.  He left the office stating that he will contemplate this and may want to pursue this after the new year.  I saw him on October 07, 2020.  His chest pain did improve with increased medical therapy but he continued to experience recurrent chest pain symptomatology with very frequent sublingual nitroglycerin use.  He presented to the emergency room on 09/17/2019.  His ECG was stable.  His amlodipine dose was increased to 10 mg.  On his increased dose he has noticed lower extremity and ankle swelling.  At his January 2022 office  visit, he was continuing to have  daily early morning chest pain and was taking approximately 10-15 sublingual nitroglycerin per week.  He presented for evaluation and at that time had decided to pursue CABG revascularization  I performed cardiac catheterization on October 14, 2020 which revealed severe multivessel progressive CAD with 40 to 50% ostial left main stenosis, diffuse 95% stenosis in the first diagonal branch of the LAD with 70% LAD stenosis beyond the diagonal vessel; 99% left circumflex stenosis and progressive 85% in-stent restenosis in a dominant RCA with 90% stenosis at the initiation of the proximal stent with 70% stenosis proximal to the AV groove.  LV function remains relatively preserved with EF estimate at 50 to 55%.  Plavix had been washed out prior to the procedure.  The following day he underwent successful CABG x 4 revascularization surgery by Dr. Orvan [name]  with a LIMA to LAD, pedicled RIMA to the right posterior lateral coronary artery, SVG to the OM1 branch of the circumflex, and SVG to the first diagonal branch.  His course was complicated by postoperative small stroke with small infarct in the left posterior cerebellar hemisphere.  He was evaluated by neurology.  RA showed patchy small volume early subacute ischemic infarcts involving the left cerebellar hemisphere.  He ultimately was discharged and spent 10 days in inpatient rehab.  He saw Dr. Orvan [name] for office follow-up evaluation on November 16, 2020 and at that time remained stable from a cardiovascular surgical standpoint.  I saw him for initial post CABG office visit follow-up on December 02, 2020.  At that time he was feeling well and denied any recurrent anginal symptomatology.  He was no longer taking amlodipine, ranolazine, isosorbide, ramipril therapy.  He apparently had stopped his Repatha for approximately 3 months this will need to be resumed.  He denies recurrent anginal symptoms.  He continues to be on aspirin and Plavix, metoprolol 12.5 mg twice a day.  He was using a nicotine patch in place of his tobacco.  During that evaluation, I provided him with samples of Repatha and reinitiated his approval.   I last saw him on March 11, 2021 and since his prior evaluation he had undergone laboratory by Dr. Claris [name] as part of his wellness exam.  Lipid studies dramatically improved with total cholesterol 187 down to 122, triglycerides 177 down to 94, LDL cholesterol 112 down to 44, and HDL cholesterol increasing from 44 up to 60.  He continues to experience anterior chest wall discomfort.  He has COPD.  He has been wheezing.  He has only been taking his prescribed Symbicort perhaps 1-2 times per week rather than twice a day.  He does not have any as needed albuterol.  He denies any recurrent anginal symptoms.  He wasusing Nicorette gum in place of nicotine patch and was no longer smoking.  During that  evaluation, he had wheezing on lung exam and a set of taking Symbicort 1-2 times per week I recommended at least 1 puff twice a day until his wheezing resolves and gave a prescription for albuterol to take on a as needed basis.  Since I last saw him, unfortunately he again resumed smoking and admits to smoking close to 1/2 pack/day.  He denies any anginal type symptoms but admits to occasional musculoskeletal chest soreness.  He is to undergo repeat laboratory with Dr. Arelia [name].  He denies palpitations PND orthopnea.  He presents for evaluation.   Past Medical History:  Diagnosis Date   Arthritis  hands    Chronic bronchitis (HCC)    Chronic sinusitis of both maxillary sinuses 04/16/2018   Sinus CT 04/25/2018 Mild mucoperiosteal thickening involving the frontoethmoidal sinuses. Otherwise largely clear sinuses with no evidence for acute sinusitis.    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 (HCC)    DOE (dyspnea on exertion) 04/16/2018   Essential hypertension, benign 09/23/2012   Changed acei to arb 04/16/2018 due to pseudowheeze > improved 06/08/2018    Hernia, inguinal 09/23/2012   Hyperlipidemia    has not tolerated statins,  would not pursue PSK9 inhibitor as advised by Dr Claiborne Billings   Hypertension    Myocardial infarction [facility])    NSTEMI (non-ST elevated myocardial infarction) ([place]) 09/23/2012   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   Tobacco abuse     Past Surgical History:  Procedure  Laterality Date   CARDIAC CATHETERIZATION     CARDIOVASCULAR STRESS TEST  11/11/2011   CORONARY ANGIOPLASTY     stents- 01/2004    CORONARY ARTERY BYPASS GRAFT N/A 10/15/2020   Procedure: CORONARY ARTERY BYPASS GRAFTING (CABG) TIMES FOUR USING BILATERAL INTERNAL MAMMARY ARTERIES AND ENDOSCOPICALLY HARVESTED GREATER SAPHENOUS VEIN AND INSERTION OF 50CC INTRAAORTIC BALLOON PUMP;  Surgeon: Jonathan Olds, MD;  Location: [place];  Service: Open Heart Surgery;  Laterality: N/A;  possible BIMA   ENDOVEIN HARVEST OF GREATER SAPHENOUS VEIN Right 10/15/2020   Procedure: ENDOVEIN HARVEST OF GREATER SAPHENOUS VEIN;  Surgeon: Jonathan Olds, MD;  Location: [place];  Service: Open Heart Surgery;  Laterality: Right;   heart stents  01/2004   INGUINAL HERNIA REPAIR Right 12/06/2012   Procedure: HERNIA REPAIR INGUINAL ADULT;  Surgeon: [name], DO;  Location: [facility];  Service: General;  Laterality: Right;   INSERTION OF MESH Right 12/06/2012   Procedure: INSERTION OF MESH;  Surgeon: [name], DO;  Location: [facility];  Service: General;  Laterality: Right;   LEFT HEART CATH AND CORONARY ANGIOGRAPHY N/A 07/22/2019   Procedure: LEFT HEART CATH AND CORONARY ANGIOGRAPHY;  Surgeon: [name], MD;  Location: [place] [facility];  Service: Cardiovascular;  Laterality: N/A;   LEFT HEART CATH AND CORONARY ANGIOGRAPHY N/A 10/14/2020   Procedure: LEFT HEART CATH AND CORONARY ANGIOGRAPHY;  Surgeon: [name], MD;  Location: [place] [facility];  Service: Cardiovascular;  Laterality: N/A;   TEE WITHOUT CARDIOVERSION N/A 10/15/2020   Procedure: TRANSESOPHAGEAL ECHOCARDIOGRAM (TEE);  Surgeon: Jonathan Olds, MD;  Location: [place];  Service: Open Heart Surgery;  Laterality: N/A;    Allergies  Allergen Reactions   Niaspan [Niacin Er] Itching and Other (See Comments)    insomnia   Statins     LFT elevavation, CK elevevation    Current Outpatient Medications  Medication Sig Dispense Refill   aspirin 81 MG chewable  tablet Chew 1 tablet (  81 mg total) by mouth daily.     clopidogrel (PLAVIX) 75 MG tablet TAKE 1 TABLET BY MOUTH  DAILY 90 tablet 2   Evolocumab (REPATHA SURECLICK) 161 MG/ML SOAJ Inject 140 mg into the skin every 14 (fourteen) days. 2 mL 11   metoprolol succinate (TOPROL-XL) 25 MG 24 hr tablet TAKE 1 AND 1/2 TABLETS BY  MOUTH DAILY 135 tablet 3   nitroGLYCERIN (NITROSTAT) 0.4 MG SL tablet PLACE 1 TABLET UNDER THE TONGUE EVERY 5 MINUTES AS NEEDED FOR CHEST PAIN UP TO 3 DOSES, IF SYMPTOMS PERSIST CALL 911 25 tablet 6   acetaminophen (TYLENOL) 325 MG tablet Take 1-2 tablets (325-650 mg total) by mouth every 4 (four) hours as needed for mild pain. (Patient not taking: Reported on 09/23/2021)     albuterol (VENTOLIN HFA) 108 (90 Base) MCG/ACT inhaler Inhale 1-2 puffs into the lungs every 6 (six) hours as needed for wheezing or shortness of breath. (Patient not taking: Reported on 09/23/2021) 8 g 2   guaiFENesin 200 MG tablet Take 1 tablet (200 mg total) by mouth every 6 (six) hours as needed for cough or to loosen phlegm. (Patient not taking: Reported on 09/23/2021) 60 suppository 0   nicotine polacrilex (NICORETTE) 2 MG gum Take 2 mg by mouth as needed for smoking cessation. (Patient not taking: Reported on 09/23/2021)     sodium chloride (OCEAN) 0.65 % SOLN nasal spray Place 1 spray into both nostrils 2 (two) times daily at 8 am and 10 pm. (Patient not taking: Reported on 09/23/2021)  0   SYMBICORT 160-4.5 MCG/ACT inhaler INHALE 2 PUFFS INTO THE LUNGS TWICE DAILY (Patient not taking: Reported on 09/23/2021) 10.2 g 11   No current facility-administered medications for this visit.    Social History   Socioeconomic History   Marital status: Married    Spouse name: Not on file   Number of children: Not on file   Years of education: Not on file   Highest education level: Not on file  Occupational History   Not on file  Tobacco Use   Smoking status: Former    Packs/day: 1.00    Years: 53.00    Pack  years: 53.00    Types: Cigarettes    Quit date: 10/14/2020    Years since quitting: 0.9   Smokeless tobacco: Never  Vaping Use   Vaping Use: Never used  Substance and Sexual Activity   Alcohol use: Yes    Alcohol/week: 5.0 standard drinks    Types: 5 Standard drinks or equivalent per week    Comment: consumes 1 pint per day    Drug use: No   Sexual activity: Yes  Other Topics Concern   Not on file  Social History Narrative   Retired   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 Has one grandchild. He works in Education officer, museum and remains busy. He still smoking. He does use occasional alcohol.  Family history is notable that his father died with heart disease at age 61.  ROS General: Negative; No fevers, chills, or night sweats;  HEENT: Negative; No  changes in vision or hearing, sinus congestion, difficulty swallowing Pulmonary:  Mild shortness of breath, no hemoptysis Cardiovascular: See HPI GI: Negative; No nausea, vomiting, diarrhea, or abdominal pain GU: Negative; No dysuria, hematuria, or difficulty voiding Musculoskeletal: Negative; no myalgias, joint pain, or weakness Hematologic/Oncology: Negative; no easy bruising, bleeding Endocrine: Negative; no heat/cold intolerance; no diabetes Neuro: Recent postoperative small left cerebellar hemisphere stroke; no changes in balance, headaches Skin: Negative; No rashes or skin lesions Psychiatric: Negative; No behavioral problems, depression Sleep: Negative; No snoring, daytime sleepiness, hypersomnolence, bruxism, restless legs, hypnogognic hallucinations, no cataplexy Other  comprehensive 14 point system review is negative.   PE BP 138/64    Pulse 67    Ht 5' 8"  (1.727 m)    Wt 211 lb 6.4 oz (95.9 kg)    SpO2 95%    BMI 32.14 kg/m    Repeat blood pressure by me was 124/70  Wt Readings from Last 3 Encounters:  09/23/21 211 lb 6.4 oz (95.9 kg)  03/11/21 206 lb 9.6 oz (93.7 kg)  12/02/20 215 lb 6.4 oz (97.7 kg)   General: Alert, oriented, no distress.  Skin: normal turgor, no rashes, warm and dry HEENT: Normocephalic, atraumatic. Pupils equal round and reactive to light; sclera anicteric; extraocular muscles intact;  Nose without nasal septal hypertrophy Mouth/Parynx benign; Mallinpatti scale 3 Neck: No JVD, no carotid bruits; normal carotid upstroke Lungs: Decreased breath sounds without wheezing  Chest wall: without tenderness to palpitation Heart: PMI not displaced, RRR, s1 s2 normal, 1/6 systolic murmur, no diastolic murmur, no rubs, gallops, thrills, or heaves Abdomen: soft, nontender; no hepatosplenomehaly, BS+; abdominal aorta nontender and not dilated by palpation. Back: no CVA tenderness Pulses 2+ Musculoskeletal: full range of motion, normal strength, no joint deformities Extremities: no clubbing cyanosis or edema, Homan's sign negative  Neurologic: grossly nonfocal; Cranial nerves grossly wnl Psychologic: Normal mood and affect    September 23, 2021 ECG (independently read by me): Sinus rhythm at 67, [facility]  March 11, 2021 ECG (independently read by me): NSR at 65; No STT changes  December 01, 2020 ECG (independently read by me): Sinus bradycardoia at 54; no ectopy  October 07, 2020 ECG (independently read by me): Sinus Bradycardia at 59; No significant ST changes  August 03, 2020 ECG (independently read by me): Normal sinus rhythm at 63 bpm.  Nonspecific ST changes.  Normal intervals.  No ectopy  February 12, 2020 ECG (independently read by me): Sinus bradycardia at 50 bpm.  PR interval 192 ms.  QTc interval 417 ms.  No significant ST-T changes.   No ectopy.  November 05, 2019 ECG (independently read by me): Sinus rhythm with occasional to frequent PVCs with VA retrograde P waves  January 2019 ECG (independently read by me): Sinus bradycardia 59 bpm.  No ectopy.  Normal intervals.  April 2018 ECG (independently read by me): Normal sinus rhythm at 61 bpm.  No ectopy.  Normal intervals.  April 2017 ECG (independently read by me): Normal sinus rhythm with an occasional PVC with retrograde P wave.  November 2015 ECG (independently read by me): Sinus bradycardia 52 bpm  Prior ECG: Sinus rhythm at 61 beats per minute. No ectopy. Normal intervals.  LABS:  BMP Latest Ref Rng & Units 11/19/2020 11/02/2020 10/26/2020  Glucose 65 - 99 mg/dL 88 96 98  BUN 8 - 27 mg/dL 10 13 32(H)  Creatinine 0.76 - 1.27 mg/dL 0.96 1.17 1.06  BUN/Creat Ratio 10 - 24 10 - -  Sodium 134 -  144 mmol/L 140 135 142  Potassium 3.5 - 5.2 mmol/L 4.8 3.9 4.3  Chloride 96 - 106 mmol/L 100 98 98  CO2 20 - 29 mmol/L 23 28 30   Calcium 8.6 - 10.2 mg/dL 9.1 8.3(L) 8.6(L)   Hepatic Function Latest Ref Rng & Units 11/19/2020 10/26/2020 10/15/2020  Total Protein 6.0 - 8.5 g/dL 6.9 6.3(L) 5.6(L)  Albumin 3.7 - 4.7 g/dL 3.8 2.7(L) 2.9(L)  AST 0 - 40 IU/L 11 30 19   ALT 0 - 44 IU/L 9 28 13   Alk Phosphatase 44 - 121 IU/L 89 44 40  Total Bilirubin 0.0 - 1.2 mg/dL 0.3 0.9 0.7  Bilirubin, Direct 0.0 - 0.2 mg/dL - - -   CBC Latest Ref Rng & Units 11/19/2020 11/02/2020 10/26/2020  WBC 3.4 - 10.8 x10E3/uL 9.5 7.8 11.5(H)  Hemoglobin 13.0 - 17.7 g/dL 12.0(L) 9.3(L) 10.3(L)  Hematocrit 37.5 - 51.0 % 36.9(L) 29.9(L) 32.6(L)  Platelets 150 - 450 x10E3/uL 316 533(H) 414(H)    Lab Results  Component Value Date   MCV 94 11/19/2020   MCV 100.7 (H) 11/02/2020   MCV 101.2 (H) 10/26/2020   Lab Results  Component Value Date   TSH 1.800 11/19/2020     Lipid Panel     Component Value Date/Time   CHOL 187 11/19/2020 1100   TRIG 177 (H) 11/19/2020 1100   HDL 44 11/19/2020 1100    CHOLHDL 4.3 11/19/2020 1100   CHOLHDL 3.2 10/20/2020 1424   VLDL 24 10/20/2020 1424   LDLCALC 112 (H) 11/19/2020 1100    RADIOLOGY: No results found.  IMPRESSION:  1. CAD in native artery   2. S/P CABG x 4   3. Hyperlipidemia with target LDL less than 70   4. Statin intolerance   5. Chronic obstructive pulmonary disease, unspecified COPD type ([place])   6. Long history of Tobacco abuse: quit initially following CABG but unfortunately resumed   7. Medication management   8. Cerebellar stroke ([place]); status post CABG, with resolution     ASSESSMENT AND PLAN: Jonathan [name] is a 71 year-old gentleman who is almost 18 years since his inferior wall myocardial infarction and emergent catheterization in May 2005 at which time he underwent successful intervention to a totally occluded mid RCA with insertion of tandem drug-eluting Cypher stents.  He had concomitant CAD involving his LAD and diagonal vessel. He developed CPK and LFT elevation with statin therapy also stopped taking all of her lipid-lowering treatment.  He  continued to smoke cigarettes.  He was hospitalized in November 2020 when he presented with non-ST segment elevation MI.  He was found to have severe multivessel CAD for which CABG revascularization surgery was recommended.  He was [name] by Dr. Gilford Raid. Preoperative carotid Doppler imaging showed mild 1 to 39% right ICA stenosis with left ICA velocities in the 40 to 59% range.  However, he ultimately refused the opportunity for bypass.  He states his father had a stroke during a bypass procedure and he did not want this to happen to him.  After refusing surgery for over a year and a half and requiring multiple medication adjustments with multiple new medications and maximal therapy due to ongoing daily anginal symptomatology he ultimately decided to pursue CABG revascularization surgery.  Repeat catheterization  demonstrated progressive disease but fortunately LV function remained  fairly stable.  He underwent successful CABG revascularization surgery with utilization of bilateral internal mammary conduits to his LAD and RCA with vein graft to his diagonal and  marginal vessel.  He developed a probable small cerebellar infarct postoperatively but fortunately has stabilized and following his hospitalization completed a 10-day stay in inpatient rehabilitation.  Presently, he is not having any anginal symptomatology.  He continues to be on DAPT with aspirin/Plavix, and metoprolol succinate 37.5 mg daily.  His previous wheezing improved with initiation of Symbicort 2 puffs twice a day.  He is on Repatha 140 mg injection every 14 days.  Unfortunately he resumed tobacco use.  I spent time with him again discussing smoking cessation and its absolute importance.  I have recommended follow-up laboratory with a comprehensive metabolic panel CBC TSH and lipid studies and this will be done by Dr. Arelia [name] when he sees him over the next several weeks.  I will see him in 6 months for reevaluation.   [name], MD, [facility]  10/09/2021 10:54 AM

## 2021-10-09 ENCOUNTER — Encounter: Payer: Self-pay | Admitting: Cardiovascular Disease

## 2021-10-11 ENCOUNTER — Telehealth: Payer: Self-pay | Admitting: Cardiovascular Disease

## 2021-10-11 NOTE — Telephone Encounter (Signed)
Patient would like to know if he still has the grant for his repatha.

## 2021-10-11 NOTE — Telephone Encounter (Signed)
Notified pt that he does not need the labslip in hand to have lab done at [facility] but will mail to pt as requested.

## 2021-10-11 NOTE — Telephone Encounter (Signed)
Routed to [place] to review/advise

## 2021-10-11 NOTE — Telephone Encounter (Signed)
Patient would like lab orders mailed to him so he can have them done by his house.

## 2021-10-11 NOTE — Telephone Encounter (Signed)
Called and spoke w/pt and stated that they were still approved PATIENT voiced understanding.  Tollie Eth   STATUS  Expiring   START DATE 11/02/2020   END DATE 11/01/2021   ASSISTANCE TYPE Co-pay   PAID $889.52   PENDING $0.00   BALANCE $1610.48.  After the

## 2021-10-29 ENCOUNTER — Other Ambulatory Visit: Payer: Self-pay

## 2021-10-29 ENCOUNTER — Telehealth: Payer: Self-pay | Admitting: Cardiovascular Disease

## 2021-10-29 NOTE — Telephone Encounter (Signed)
Spoke with patient. Mailed lab orders to him.

## 2021-10-29 NOTE — Telephone Encounter (Signed)
Follow Up:     Patient said he was told his lab order would be mailed, so he could go to [employer] [facility]. Patient says he have not received the order.

## 2021-11-01 NOTE — Telephone Encounter (Signed)
Patient is following up, requesting to speak with Grandville Silos, CMA regarding grant.

## 2021-11-01 NOTE — Telephone Encounter (Signed)
Called and provided [employer] renewal information to the pt: [place]  Renewal   START DATE 11/02/2021   END DATE 11/01/2022   ASSISTANCE TYPE Co-pay   PAID $0.00   PENDING $0.00   BALANCE $2500.00 Pharmacy Card CARD NO. [id number]   CARD STATUS Active   BIN [id number]   PCN [id number]   PC GROUP [id number]   HELP DESK [phone]   PROVIDER [id number]   PROCESSOR PDMI

## 2021-12-06 ENCOUNTER — Ambulatory Visit: Payer: Medicare Other | Admitting: Podiatry

## 2021-12-06 ENCOUNTER — Other Ambulatory Visit: Payer: Self-pay

## 2021-12-06 DIAGNOSIS — Z7901 Long term (current) use of anticoagulants: Secondary | ICD-10-CM

## 2021-12-06 DIAGNOSIS — M79674 Pain in right toe(s): Secondary | ICD-10-CM | POA: Diagnosis not present

## 2021-12-06 DIAGNOSIS — Q828 Other specified congenital malformations of skin: Secondary | ICD-10-CM

## 2021-12-06 DIAGNOSIS — B351 Tinea unguium: Secondary | ICD-10-CM

## 2021-12-06 DIAGNOSIS — M79675 Pain in left toe(s): Secondary | ICD-10-CM

## 2021-12-06 NOTE — Progress Notes (Signed)
Subjective: ?71 year old male presents the office today for concerns of thick, elongated toenails that he cannot trim himself as well as for calluses.  Denies any swelling or redness or any drainage to the toenail, callus sites.  Denies any ulcerations.  No other concerns. ? ?Objective: ?AAO x3, NAD ?DP/PT pulses palpable bilaterally, CRT less than 3 seconds ?Nails are hypertrophic, dystrophic, brittle, discolored, elongated ?10. No surrounding redness or drainage. Tenderness nails 1-5 bilaterally. No open lesions or pre-ulcerative lesions are identified today. ?Hyperkeratotic lesions noted bilateral medial hallux and submetatarsal 5.  No ongoing ulceration drainage or signs of infection. ?No pain with calf compression, swelling, warmth, erythema ? ?Assessment: ?71 year old male with symptomatic onychomycosis, hyperkeratotic lesions ? ?Plan: ?-All treatment options discussed with the patient including all alternatives, risks, complications.  ?-Sharply debrided the nails x10 without any complications or bleeding ?-Sharply debrided hyperkeratotic lesions x4 without any complications or bleeding.  Discussed moisturizer and offloading. ?-Daily foot inspection. ?-Patient encouraged to call the office with any questions, concerns, change in symptoms.  ? ?Return in about 3 months (around 03/08/2022). ? ?Trula Slade DPM ? ?

## 2021-12-07 ENCOUNTER — Other Ambulatory Visit: Payer: Self-pay | Admitting: Cardiovascular Disease

## 2022-01-17 ENCOUNTER — Other Ambulatory Visit: Payer: Self-pay | Admitting: Cardiovascular Disease

## 2022-06-01 ENCOUNTER — Encounter (HOSPITAL_COMMUNITY): Payer: Self-pay | Admitting: Emergency Medicine

## 2022-06-01 ENCOUNTER — Emergency Department (HOSPITAL_COMMUNITY): Payer: Medicare Other

## 2022-06-01 ENCOUNTER — Emergency Department (HOSPITAL_COMMUNITY)
Admission: EM | Admit: 2022-06-01 | Discharge: 2022-06-02 | Disposition: A | Discharge status: Home / Self Care | Payer: Medicare Other | Attending: Emergency Medicine | Admitting: Emergency Medicine

## 2022-06-01 DIAGNOSIS — Z79899 Other long term (current) drug therapy: Secondary | ICD-10-CM | POA: Diagnosis not present

## 2022-06-01 DIAGNOSIS — I10 Essential (primary) hypertension: Secondary | ICD-10-CM | POA: Diagnosis not present

## 2022-06-01 DIAGNOSIS — I2510 Atherosclerotic heart disease of native coronary artery without angina pectoris: Secondary | ICD-10-CM | POA: Diagnosis not present

## 2022-06-01 DIAGNOSIS — Z20822 Contact with and (suspected) exposure to covid-19: Secondary | ICD-10-CM | POA: Insufficient documentation

## 2022-06-01 DIAGNOSIS — R0602 Shortness of breath: Secondary | ICD-10-CM | POA: Diagnosis present

## 2022-06-01 DIAGNOSIS — Z7982 Long term (current) use of aspirin: Secondary | ICD-10-CM | POA: Diagnosis not present

## 2022-06-01 DIAGNOSIS — J449 Chronic obstructive pulmonary disease, unspecified: Secondary | ICD-10-CM | POA: Insufficient documentation

## 2022-06-01 DIAGNOSIS — N433 Hydrocele, unspecified: Secondary | ICD-10-CM

## 2022-06-01 DIAGNOSIS — Z7901 Long term (current) use of anticoagulants: Secondary | ICD-10-CM | POA: Insufficient documentation

## 2022-06-01 DIAGNOSIS — J441 Chronic obstructive pulmonary disease with (acute) exacerbation: Secondary | ICD-10-CM

## 2022-06-01 DIAGNOSIS — I861 Scrotal varices: Secondary | ICD-10-CM

## 2022-06-01 LAB — BASIC METABOLIC PANEL
Anion gap: 11 (ref 5–15)
BUN: 13 mg/dL (ref 8–23)
CO2: 26 mmol/L (ref 22–32)
Calcium: 8.7 mg/dL — ABNORMAL LOW (ref 8.9–10.3)
Chloride: 99 mmol/L (ref 98–111)
Creatinine, Ser: 1.2 mg/dL (ref 0.61–1.24)
GFR, Estimated: >60 mL/min (ref >60)
Glucose, Bld: 90 mg/dL (ref 70–99)
Potassium: 4.1 mmol/L (ref 3.5–5.1)
Sodium: 136 mmol/L (ref 135–145)

## 2022-06-01 LAB — CBC
HCT: 48.6 % (ref 39.0–52.0)
Hemoglobin: 17.4 g/dL — ABNORMAL HIGH (ref 13.0–17.0)
MCH: 36.6 pg — ABNORMAL HIGH (ref 26.0–34.0)
MCHC: 35.8 g/dL (ref 30.0–36.0)
MCV: 102.1 fL — ABNORMAL HIGH (ref 80.0–100.0)
Platelets: 95 10*3/uL — ABNORMAL LOW (ref 150–400)
RBC: 4.76 MIL/uL (ref 4.22–5.81)
RDW: 14.6 % (ref 11.5–15.5)
WBC: 7.4 10*3/uL (ref 4.0–10.5)
nRBC: 0 % (ref 0.0–0.2)

## 2022-06-01 LAB — BRAIN NATRIURETIC PEPTIDE: B Natriuretic Peptide: 267.3 pg/mL — ABNORMAL HIGH (ref 0.0–100.0)

## 2022-06-01 LAB — SARS CORONAVIRUS 2 BY RT PCR: SARS Coronavirus 2 by RT PCR: NEGATIVE

## 2022-06-01 MED ORDER — METHYLPREDNISOLONE SODIUM SUCC 125 MG IJ SOLR
125.0000 mg | Freq: Once | INTRAMUSCULAR | Status: AC
  Administered 2022-06-01: 125 mg via INTRAVENOUS
  Filled 2022-06-01: qty 2

## 2022-06-01 MED ORDER — IPRATROPIUM-ALBUTEROL 0.5-2.5 (3) MG/3ML IN SOLN
3.0000 mL | Freq: Once | RESPIRATORY_TRACT | Status: AC
  Administered 2022-06-02: 3 mL via RESPIRATORY_TRACT

## 2022-06-01 MED ORDER — IPRATROPIUM-ALBUTEROL 0.5-2.5 (3) MG/3ML IN SOLN
3.0000 mL | Freq: Once | RESPIRATORY_TRACT | Status: AC
  Administered 2022-06-02: 3 mL via RESPIRATORY_TRACT
  Filled 2022-06-01: qty 3

## 2022-06-01 MED ORDER — IPRATROPIUM-ALBUTEROL 0.5-2.5 (3) MG/3ML IN SOLN
3.0000 mL | Freq: Once | RESPIRATORY_TRACT | Status: AC
  Administered 2022-06-01: 3 mL via RESPIRATORY_TRACT
  Filled 2022-06-01: qty 3

## 2022-06-01 NOTE — ED Notes (Signed)
Pt able to ambulate in hall without an increase in work of breathing

## 2022-06-01 NOTE — ED Triage Notes (Signed)
Patient with history of COPD here with complaint of worsened shortness of breath that started one month ago, also complains of abdominal pain that started three months ago. Patient denies nausea, denies vomiting, denies diarrhea. Patient is alert, oriented, speaking in complete sentences.

## 2022-06-01 NOTE — ED Provider Triage Note (Signed)
Emergency Medicine Provider Triage Evaluation Note  Jonathan [name] , a 71 y.o. male  was evaluated in triage.  Pt complains of sob. Report increase sob at night and in the morning with increase wheezes.  Felt similar to prior COPD exacerbation.  Sxs ongoing nearly a month.  Also report intermittent L testicular swelling x 3-4 months.  Denies urinary sxs.   Review of Systems  Positive: As above Negative: As above  Physical Exam  BP (!) 156/89 (BP Location: Right Arm)   Pulse 63   Temp 98.5 F (36.9 C)   Resp 18   SpO2 90%  Gen:   Awake, no distress   Resp:  Normal effort  MSK:   Moves extremities without difficulty  Other:    Medical Decision Making  Medically screening exam initiated at 1:09 PM.  Appropriate orders placed.  [name] was informed that the remainder of the evaluation will be completed by another provider, this initial triage assessment does not replace that evaluation, and the importance of remaining in the ED until their evaluation is complete.     Domenic Moras, PA-C 06/01/22 1315

## 2022-06-01 NOTE — ED Notes (Signed)
Asks about wait time. Swab obtain for COVID. Vitals taken. Pt continues to walk around lobby using wheelchair as walker.

## 2022-06-02 ENCOUNTER — Telehealth: Payer: Self-pay | Admitting: Cardiovascular Disease

## 2022-06-02 LAB — HEPATIC FUNCTION PANEL
ALT: 33 U/L (ref 0–44)
AST: 48 U/L — ABNORMAL HIGH (ref 15–41)
Albumin: 3.6 g/dL (ref 3.5–5.0)
Alkaline Phosphatase: 51 U/L (ref 38–126)
Bilirubin, Direct: 0.6 mg/dL — ABNORMAL HIGH (ref 0.0–0.2)
Indirect Bilirubin: 1.6 mg/dL — ABNORMAL HIGH (ref 0.3–0.9)
Total Bilirubin: 2.2 mg/dL — ABNORMAL HIGH (ref 0.3–1.2)
Total Protein: 6.9 g/dL (ref 6.5–8.1)

## 2022-06-02 LAB — TROPONIN I (HIGH SENSITIVITY)
Troponin I (High Sensitivity): 10 ng/L (ref <18)
Troponin I (High Sensitivity): 11 ng/L (ref <18)

## 2022-06-02 MED ORDER — PREDNISONE 10 MG PO TABS: 40.0000 mg | ORAL_TABLET | Freq: Every day | ORAL | 0 refills | Status: AC

## 2022-06-02 NOTE — Telephone Encounter (Signed)
Routed to Pharmacy to advise if OK to take prednisone with current meds, conditions

## 2022-06-02 NOTE — Discharge Instructions (Addendum)
Your workup today is reassuring. Your lungs are very improved after treatments that we have done here.  I have prescribed you a 5 day course of Prednisone. Please pick this up and start taking tomorrow. Please continue your home inhalers as needed Please schedule a follow up visit with your PCP.  Please return for worsening symptoms.

## 2022-06-02 NOTE — ED Provider Notes (Signed)
Accepted handoff at shift change from [place], PA-C. Please see prior provider note for more detail.   Briefly: Patient is 71 y.o. male with a PMH of HTN, CAD s/p CABG x 4, NSTEMI, HLD, Tobacco use, Obesity, COPD Gold II, CVA,  who presents to the ED with a chief complaint of worsening shortness of breath starting one month ago and worsening over the last couple of day. He has had a nonproductive cough that is not different from baseline. He has been using his home inhalers. Denies chest pain, abdominal pain, nausea, vomiting, fevers. Also complaining of several month history of scrotal swelling   Workup thus far reveals No leukocytosis, not anemic, normal renal function, electrolytes okay, not acidotic, LFTS stable, Troponin negative x 2, COVID negative, CXR normal, EKG non-ischemic/NSR, Scrotal US notable for small hydrocele  Interventions: Solumedrol, duo neb x 3  DDX: concern for COPD exacerbation  Plan: F/u on labs, reassess after duo neb x 2, anticipate discharge.    Physical Exam  BP (!) 172/84   Pulse (!) 56   Temp 98.6 F (37 C) (Oral)   Resp 20   SpO2 96%   Physical Exam Vitals and nursing note reviewed.  Constitutional:      General: He is not in acute distress.    Appearance: Normal appearance. He is well-developed. He is not ill-appearing, toxic-appearing or diaphoretic.  HENT:     Head: Normocephalic and atraumatic.     Nose: No nasal deformity.     Mouth/Throat:     Lips: Pink. No lesions.  Eyes:     General: Gaze aligned appropriately. No scleral icterus.       Right eye: No discharge.        Left eye: No discharge.     Conjunctiva/sclera: Conjunctivae normal.     Right eye: Right conjunctiva is not injected. No exudate or hemorrhage.    Left eye: Left conjunctiva is not injected. No exudate or hemorrhage. Pulmonary:     Effort: Pulmonary effort is normal. No tachypnea, accessory muscle usage or respiratory distress.     Breath sounds: Normal breath sounds.  No decreased breath sounds, wheezing, rhonchi or rales.  Skin:    General: Skin is warm and dry.  Neurological:     Mental Status: He is alert and oriented to person, place, and time.  Psychiatric:        Mood and Affect: Mood normal.        Speech: Speech normal.        Behavior: Behavior normal. Behavior is cooperative.     Procedures  Procedures  ED Course / MDM   Clinical Course as of 06/02/22 0033  Wed Jun 01, 2022  2345 Scrotal ultrasound discussed with radiologist Dr. Sherrye Payor, who reports that there was sizable varicocele in the left hemiscrotum notable on initial ultrasound though not documented.  Additionally confirms read of right hydrocele.  Clinical exam consistent with this with "bag of worms" sensation on physical exam the left hemiscrotum and right scrotal exam consistent with hydrocele.  I appreciate his collaboration in the care of this patient. [RS]  2348 COPD exacerbation. Solumedrol, duo neb. Needs reassess after 2 duo neb. F/u on labs.  [GL]    Clinical Course User Index [GL] [place] Whed[place], Adora Fridge, PA-C [RS] Sponseller, Gypsy Balsam, PA-C   Medical Decision Making Amount and/or Complexity of Data Reviewed Labs: ordered. Radiology: ordered.  Risk Prescription drug management.   I reviewed labs and no concerning findings. I have  reassessed this patient and his lungs are now clear. He is not hypoxic on RA. He has no increased work of breathing. He complains of no chest pain or other new symptoms. He does not meet admission criteria at this time. He does not have a productive cough or fevers and I do not feel he needs antibiotics today. I have prescribed a prednisone burst and recommend PCP followup.        Adolphus Birchwood, PA-C 06/02/22 0246    Maudie Flakes, MD 06/02/22 [phone]

## 2022-06-02 NOTE — ED Provider Notes (Incomplete)
[facility] EMERGENCY DEPARTMENT Provider Note   CSN: [id number] Arrival date & time: 06/01/22  1117     History {Add pertinent medical, surgical, social history, OB history to HPI:1} Chief Complaint  Patient presents with  . Shortness of Breath    Jonathan [name] is a 71 y.o. male with history of COPD presents with concern for worsening shortness of breath that started 1 month ago worse in the last couple of days.  Been using his albuterol at home with some improvement, Symbicort twice per day.  Also endorses a few months of scrotal swelling bilaterally left greater than right, no worsening pain but irritation secondary to discomfort when sitting.  No difficulty urinating or having bowel movements.  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  I personally reviewed his medical records.  He is not anticoagulated but is on dual antiplatelet therapy with aspirin and Plavix.  HPI     Home Medications Prior to Admission medications   Medication Sig Start Date End Date Taking? Authorizing Provider  acetaminophen (TYLENOL) 325 MG tablet Take 1-2 tablets (325-650 mg total) by mouth every 4 (four) hours as needed for mild pain. Patient not taking: Reported on 09/23/2021 11/04/20   Love, Ivan Anchors, PA-C  albuterol (VENTOLIN HFA) 108 (90 Base) MCG/ACT inhaler Inhale 1-2 puffs into the lungs every 6 (six) hours as needed for wheezing or shortness of breath. Patient not taking: Reported on 09/23/2021 03/11/21   [name], MD  aspirin 81 MG chewable tablet Chew 1 tablet (81 mg total) by mouth daily. 11/04/20   Love, Ivan Anchors, PA-C  clopidogrel (PLAVIX) 75 MG tablet TAKE 1 TABLET BY MOUTH  DAILY 01/17/22   [name], MD  guaiFENesin 200 MG tablet Take 1 tablet (200 mg total) by mouth every 6 (six) hours as needed for cough or to loosen phlegm. Patient not taking: Reported on  09/23/2021 11/04/20   Bary Leriche, PA-C  metoprolol succinate (TOPROL-XL) 25 MG 24 hr tablet TAKE 1 AND 1/2 TABLETS BY  MOUTH DAILY 01/17/22   [name], MD  nicotine polacrilex (NICORETTE) 2 MG gum Take 2 mg by mouth as needed for smoking cessation. Patient not taking: Reported on 09/23/2021    [provider]  nitroGLYCERIN (NITROSTAT) 0.4 MG SL tablet PLACE 1 TABLET UNDER THE TONGUE EVERY 5 MINUTES AS NEEDED FOR CHEST PAIN UP TO 3 DOSES, IF SYMPTOMS PERSIST CALL 911 09/07/20   [name], MD  REPATHA SURECLICK 545 MG/ML SOAJ INJECT 140 MG INTO THE SKIN EVERY 14 DAYS 12/07/21   [name], MD  sodium chloride (OCEAN) 0.65 % SOLN nasal spray Place 1 spray into both nostrils 2 (two) times daily at 8 am and 10 pm. Patient not taking: Reported on 09/23/2021 11/04/20   Bary Leriche, PA-C  SYMBICORT 160-4.5 MCG/ACT inhaler INHALE 2 PUFFS INTO THE LUNGS TWICE DAILY Patient not taking: Reported on 09/23/2021 09/10/21   Martyn Ehrich, NP      Allergies    Niaspan [niacin er] and Statins    Review of Systems   Review of Systems  Respiratory:  Positive for chest tightness and shortness of breath.   Genitourinary:  Positive for scrotal swelling.    Physical Exam Updated Vital Signs BP (!) 172/84   Pulse (!) 56   Temp 98.6 F (37 C) (Oral)  Resp 20   SpO2 96%  Physical Exam Vitals and nursing note reviewed. Exam conducted with a chaperone present (ED tech).  Constitutional:      Appearance: He is not ill-appearing or toxic-appearing.  HENT:     Head: Normocephalic and atraumatic.     Mouth/Throat:     Mouth: Mucous membranes are moist.     Pharynx: No oropharyngeal exudate or posterior oropharyngeal erythema.  Eyes:     General:        Right eye: No discharge.        Left eye: No discharge.     Conjunctiva/sclera: Conjunctivae normal.  Cardiovascular:     Rate and Rhythm: Normal rate and regular rhythm.     Pulses: Normal pulses.  Pulmonary:     Effort:  Pulmonary effort is normal. Prolonged expiration present. No tachypnea, bradypnea, accessory muscle usage, respiratory distress or retractions.     Breath sounds: Decreased air movement present. Examination of the left-upper field reveals wheezing. Examination of the right-middle field reveals wheezing. Examination of the left-middle field reveals wheezing. Examination of the right-lower field reveals wheezing. Examination of the left-lower field reveals wheezing. Wheezing present. No rales.  Abdominal:     General: Bowel sounds are normal. There is no distension.     Palpations: Abdomen is soft.     Tenderness: There is no abdominal tenderness.  Genitourinary:    Testes: Cremasteric reflex is present.        Right: Swelling and testicular hydrocele present.        Left: Swelling and varicocele present.     Epididymis:     Right: Normal.     Left: Normal.     Comments: Heaviness sensation in the scrotum and relieved with scrotal support by this provider.  No erythema, induration, or warmth to the touch to suggest infectious etiology Musculoskeletal:        General: No deformity.     Cervical back: Neck supple.     Right lower leg: No tenderness. No edema.     Left lower leg: No tenderness. No edema.  Skin:    General: Skin is warm and dry.     Capillary Refill: Capillary refill takes less than 2 seconds.  Neurological:     General: No focal deficit present.     Mental Status: He is alert and oriented to person, place, and time. Mental status is at baseline.  Psychiatric:        Mood and Affect: Mood normal.     ED Results / Procedures / Treatments   Labs (all labs ordered are listed, but only abnormal results are displayed) Labs Reviewed  BASIC METABOLIC PANEL - Abnormal; Notable for the following components:      Result Value   Calcium 8.7 (*)    All other components within normal limits  CBC - Abnormal; Notable for the following components:   Hemoglobin 17.4 (*)    MCV 102.1  (*)    MCH 36.6 (*)    Platelets 95 (*)    All other components within normal limits  BRAIN NATRIURETIC PEPTIDE - Abnormal; Notable for the following components:   B Natriuretic Peptide 267.3 (*)    All other components within normal limits  SARS CORONAVIRUS 2 BY RT PCR  RESP PANEL BY RT-PCR (FLU A&B, COVID) ARPGX2  HEPATIC FUNCTION PANEL  TROPONIN I (HIGH SENSITIVITY)  TROPONIN I (HIGH SENSITIVITY)    EKG EKG Interpretation  Date/Time:  Wednesday June 01 2022  13:03:35 EDT Ventricular Rate:  60 PR Interval:  158 QRS Duration: 102 QT Interval:  460 QTC Calculation: 460 R Axis:   94 Text Interpretation: Sinus rhythm with occasional Premature ventricular complexes Rightward axis Borderline ECG When compared with ECG of 16-Oct-2020 06:51, PREVIOUS ECG IS PRESENT when compared to prior, more PVC No STEMI Confirmed by Antony Blackbird [phone]) on 06/01/2022 9:07:08 PM  Radiology DG Chest 2 View  Result Date: 06/01/2022 CLINICAL DATA:  Short of breath EXAM: CHEST - 2 VIEW COMPARISON:  None Available. FINDINGS: Sternal wires overlie normal cardiac silhouette. No effusion, infiltrate pneumothorax. Nipple shadows noted. No acute osseous abnormality. IMPRESSION: No acute cardiopulmonary process. Electronically Signed   By: Suzy Bouchard M.D.   On: 06/01/2022 16:44   US SCROTUM W/DOPPLER  Result Date: 06/01/2022 CLINICAL DATA:  Scrotal pain and swelling. EXAM: SCROTAL ULTRASOUND DOPPLER ULTRASOUND OF THE TESTICLES TECHNIQUE: Complete ultrasound examination of the testicles, epididymis, and other scrotal structures was performed. Color and spectral Doppler ultrasound were also utilized to evaluate blood flow to the testicles. COMPARISON:  May 02, 2021. FINDINGS: Right testicle Measurements: 4.1 x 2.6 x 2.4 cm. No mass or microlithiasis visualized. Left testicle Measurements: 4.1 x 2.8 x 2.0 cm. No mass or microlithiasis visualized. Right epididymis:  Normal in size and appearance. Left  epididymis:  Normal in size and appearance. Hydrocele:  Small right hydrocele is noted. Varicocele:  None visualized. Pulsed Doppler interrogation of both testes demonstrates normal low resistance arterial and venous waveforms bilaterally. IMPRESSION: No evidence of testicular mass or torsion.  Small right hydrocele. Electronically Signed   By: Marijo Conception M.D.   On: 06/01/2022 14:34    Procedures Procedures  {Document cardiac monitor, telemetry assessment procedure when appropriate:1}  Medications Ordered in ED Medications  ipratropium-albuterol (DUONEB) 0.5-2.5 (3) MG/3ML nebulizer solution 3 mL (has no administration in time range)  ipratropium-albuterol (DUONEB) 0.5-2.5 (3) MG/3ML nebulizer solution 3 mL (has no administration in time range)  ipratropium-albuterol (DUONEB) 0.5-2.5 (3) MG/3ML nebulizer solution 3 mL (3 mLs Nebulization Given 06/01/22 2243)  methylPREDNISolone sodium succinate (SOLU-MEDROL) 125 mg/2 mL injection 125 mg (125 mg Intravenous Given 06/01/22 2244)    ED Course/ Medical Decision Making/ A&P Clinical Course as of 06/02/22 0000  Wed Jun 01, 2022  2345 Scrotal ultrasound discussed with radiologist Dr. Sherrye Payor, who reports that there was sizable varicocele in the left hemiscrotum notable on initial ultrasound though not documented.  Additionally confirms read of right hydrocele.  Clinical exam consistent with this with "bag of worms" sensation on physical exam the left hemiscrotum and right scrotal exam consistent with hydrocele.  I appreciate his collaboration in the care of this patient. [RS]  2348 COPD exacerbation. Solumedrol, duo neb. Needs reassess after 2 duo neb. F/u on labs.  [GL]    Clinical Course User Index [GL] Loe[place]ler, Adora Fridge, PA-C [RS] Yvonne Kendall Gypsy Balsam, PA-C                           Medical Decision Making 25 male with COPD who presents with concern for shortness of breath.  Hypertensive on intake, borderline O2 with oxygen 90% on room  air.  Vital signs otherwise normal.  Cardiac exam is unremarkable, pulmonary exam as above with decreased air movement throughout and wheezing throughout the lung fields bilaterally.  No respiratory distress.  GU exam as above with findings consistent with right-sided hydrocele and left-sided varicocele without evidence of infection.  No blood  in the urethral meatus.  Different diagnosis for shortness of breath including limited to COPD exacerbation, CHF exacerbation, ACS, PE, pleural effusion, pneumothorax.  Regarding his scrotal swelling DDx includes is limited to hydrocele, varicocele, epididymitis, scrotal abscess, inguinal hernia.  Amount and/or Complexity of Data Reviewed Labs: ordered. Radiology: ordered.  Risk Prescription drug management.   ***  {Document critical care time when appropriate:1} {Document review of labs and clinical decision tools ie heart score, Chads2Vasc2 etc:1}  {Document your independent review of radiology images, and any outside records:1} {Document your discussion with family members, caretakers, and with consultants:1} {Document social determinants of health affecting pt's care:1} {Document your decision making why or why not admission, treatments were needed:1} Final Clinical Impression(s) / ED Diagnoses Final diagnoses:  None    Rx / DC Orders ED Discharge Orders     None

## 2022-06-02 NOTE — Telephone Encounter (Signed)
Left message with PharmD advice

## 2022-06-02 NOTE — Telephone Encounter (Signed)
Being treated for possible COPD exacerbation. Recommend her complete course of prednisone and follow up with pulmonology

## 2022-06-02 NOTE — Telephone Encounter (Signed)
Pt c/o medication issue:  1. Name of Medication: predniSONE (DELTASONE) 10 MG tablet  2. How are you currently taking this medication (dosage and times per day)?  Take 4 tablets (40 mg total) by mouth daily for 5 days. - Oral  3. Are you having a reaction (difficulty breathing--STAT)?   4. What is your medication issue? Pt was prescribed this medication and wants to know if its okay to take it

## 2022-06-02 NOTE — ED Provider Notes (Signed)
[facility] EMERGENCY DEPARTMENT Provider Note   CSN: [id number] Arrival date & time: 06/01/22  1117     History  Chief Complaint  Patient presents with   Shortness of Breath    Jonathan [name] is a 71 y.o. male with history of COPD presents with concern for worsening shortness of breath that started 1 month ago worse in the last couple of days.  Been using his albuterol at home with some improvement, Symbicort twice per day.  Also endorses a few months of scrotal swelling bilaterally left greater than right, no worsening pain but irritation secondary to discomfort when sitting.  No difficulty urinating or having bowel movements.  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  I personally reviewed his medical records.  He is not anticoagulated but is on dual antiplatelet therapy with aspirin and Plavix.  HPI     Home Medications Prior to Admission medications   Medication Sig Start Date End Date Taking? Authorizing Provider  acetaminophen (TYLENOL) 325 MG tablet Take 1-2 tablets (325-650 mg total) by mouth every 4 (four) hours as needed for mild pain. Patient not taking: Reported on 09/23/2021 11/04/20   Love, Ivan Anchors, PA-C  albuterol (VENTOLIN HFA) 108 (90 Base) MCG/ACT inhaler Inhale 1-2 puffs into the lungs every 6 (six) hours as needed for wheezing or shortness of breath. Patient not taking: Reported on 09/23/2021 03/11/21   [name], MD  aspirin 81 MG chewable tablet Chew 1 tablet (81 mg total) by mouth daily. 11/04/20   Love, Ivan Anchors, PA-C  clopidogrel (PLAVIX) 75 MG tablet TAKE 1 TABLET BY MOUTH  DAILY 01/17/22   [name], MD  guaiFENesin 200 MG tablet Take 1 tablet (200 mg total) by mouth every 6 (six) hours as needed for cough or to loosen phlegm. Patient not taking: Reported on 09/23/2021 11/04/20   Bary Leriche, PA-C  metoprolol succinate  (TOPROL-XL) 25 MG 24 hr tablet TAKE 1 AND 1/2 TABLETS BY  MOUTH DAILY 01/17/22   [name], MD  nicotine polacrilex (NICORETTE) 2 MG gum Take 2 mg by mouth as needed for smoking cessation. Patient not taking: Reported on 09/23/2021    [provider]  nitroGLYCERIN (NITROSTAT) 0.4 MG SL tablet PLACE 1 TABLET UNDER THE TONGUE EVERY 5 MINUTES AS NEEDED FOR CHEST PAIN UP TO 3 DOSES, IF SYMPTOMS PERSIST CALL 911 09/07/20   [name], MD  REPATHA SURECLICK 637 MG/ML SOAJ INJECT 140 MG INTO THE SKIN EVERY 14 DAYS 12/07/21   [name], MD  sodium chloride (OCEAN) 0.65 % SOLN nasal spray Place 1 spray into both nostrils 2 (two) times daily at 8 am and 10 pm. Patient not taking: Reported on 09/23/2021 11/04/20   Bary Leriche, PA-C  SYMBICORT 160-4.5 MCG/ACT inhaler INHALE 2 PUFFS INTO THE LUNGS TWICE DAILY Patient not taking: Reported on 09/23/2021 09/10/21   Martyn Ehrich, NP      Allergies    Niaspan [niacin er] and Statins    Review of Systems   Review of Systems  Respiratory:  Positive for chest tightness and shortness of breath.   Genitourinary:  Positive for scrotal swelling.    Physical Exam Updated Vital Signs BP (!) 172/84   Pulse (!) 56   Temp 98.6 F (37 C) (Oral)   Resp 20   SpO2 96%  Physical Exam  Vitals and nursing note reviewed. Exam conducted with a chaperone present (ED tech).  Constitutional:      Appearance: He is not ill-appearing or toxic-appearing.  HENT:     Head: Normocephalic and atraumatic.     Mouth/Throat:     Mouth: Mucous membranes are moist.     Pharynx: No oropharyngeal exudate or posterior oropharyngeal erythema.  Eyes:     General:        Right eye: No discharge.        Left eye: No discharge.     Conjunctiva/sclera: Conjunctivae normal.  Cardiovascular:     Rate and Rhythm: Normal rate and regular rhythm.     Pulses: Normal pulses.  Pulmonary:     Effort: Pulmonary effort is normal. Prolonged expiration present. No  tachypnea, bradypnea, accessory muscle usage, respiratory distress or retractions.     Breath sounds: Decreased air movement present. Examination of the left-upper field reveals wheezing. Examination of the right-middle field reveals wheezing. Examination of the left-middle field reveals wheezing. Examination of the right-lower field reveals wheezing. Examination of the left-lower field reveals wheezing. Wheezing present. No rales.  Abdominal:     General: Bowel sounds are normal. There is no distension.     Palpations: Abdomen is soft.     Tenderness: There is no abdominal tenderness.  Genitourinary:    Testes: Cremasteric reflex is present.        Right: Swelling and testicular hydrocele present.        Left: Swelling and varicocele present.     Epididymis:     Right: Normal.     Left: Normal.     Comments: Heaviness sensation in the scrotum and relieved with scrotal support by this provider.  No erythema, induration, or warmth to the touch to suggest infectious etiology Musculoskeletal:        General: No deformity.     Cervical back: Neck supple.     Right lower leg: No tenderness. No edema.     Left lower leg: No tenderness. No edema.  Skin:    General: Skin is warm and dry.     Capillary Refill: Capillary refill takes less than 2 seconds.  Neurological:     General: No focal deficit present.     Mental Status: He is alert and oriented to person, place, and time. Mental status is at baseline.  Psychiatric:        Mood and Affect: Mood normal.     ED Results / Procedures / Treatments   Labs (all labs ordered are listed, but only abnormal results are displayed) Labs Reviewed  BASIC METABOLIC PANEL - Abnormal; Notable for the following components:      Result Value   Calcium 8.7 (*)    All other components within normal limits  CBC - Abnormal; Notable for the following components:   Hemoglobin 17.4 (*)    MCV 102.1 (*)    MCH 36.6 (*)    Platelets 95 (*)    All other  components within normal limits  BRAIN NATRIURETIC PEPTIDE - Abnormal; Notable for the following components:   B Natriuretic Peptide 267.3 (*)    All other components within normal limits  SARS CORONAVIRUS 2 BY RT PCR  RESP PANEL BY RT-PCR (FLU A&B, COVID) [id number]  HEPATIC FUNCTION PANEL  TROPONIN I (HIGH SENSITIVITY)  TROPONIN I (HIGH SENSITIVITY)    EKG EKG Interpretation  Date/Time:  Wednesday June 01 2022 13:03:35 EDT Ventricular Rate:  60 PR Interval:  158 QRS Duration: 102 QT Interval:  460 QTC Calculation: 460 R Axis:   94 Text Interpretation: Sinus rhythm with occasional Premature ventricular complexes Rightward axis Borderline ECG When compared with ECG of 16-Oct-2020 06:51, PREVIOUS ECG IS PRESENT when compared to prior, more PVC No STEMI Confirmed by Antony Blackbird [phone]) on 06/01/2022 9:07:08 PM  Radiology DG Chest 2 View  Result Date: 06/01/2022 CLINICAL DATA:  Short of breath EXAM: CHEST - 2 VIEW COMPARISON:  None Available. FINDINGS: Sternal wires overlie normal cardiac silhouette. No effusion, infiltrate pneumothorax. Nipple shadows noted. No acute osseous abnormality. IMPRESSION: No acute cardiopulmonary process. Electronically Signed   By: Suzy Bouchard M.D.   On: 06/01/2022 16:44   US SCROTUM W/DOPPLER  Result Date: 06/01/2022 CLINICAL DATA:  Scrotal pain and swelling. EXAM: SCROTAL ULTRASOUND DOPPLER ULTRASOUND OF THE TESTICLES TECHNIQUE: Complete ultrasound examination of the testicles, epididymis, and other scrotal structures was performed. Color and spectral Doppler ultrasound were also utilized to evaluate blood flow to the testicles. COMPARISON:  May 02, 2021. FINDINGS: Right testicle Measurements: 4.1 x 2.6 x 2.4 cm. No mass or microlithiasis visualized. Left testicle Measurements: 4.1 x 2.8 x 2.0 cm. No mass or microlithiasis visualized. Right epididymis:  Normal in size and appearance. Left epididymis:  Normal in size and appearance. Hydrocele:   Small right hydrocele is noted. Varicocele:  None visualized. Pulsed Doppler interrogation of both testes demonstrates normal low resistance arterial and venous waveforms bilaterally. IMPRESSION: No evidence of testicular mass or torsion.  Small right hydrocele. Electronically Signed   By: Marijo Conception M.D.   On: 06/01/2022 14:34    Procedures Procedures   Medications Ordered in ED Medications  ipratropium-albuterol (DUONEB) 0.5-2.5 (3) MG/3ML nebulizer solution 3 mL (has no administration in time range)  ipratropium-albuterol (DUONEB) 0.5-2.5 (3) MG/3ML nebulizer solution 3 mL (has no administration in time range)  ipratropium-albuterol (DUONEB) 0.5-2.5 (3) MG/3ML nebulizer solution 3 mL (3 mLs Nebulization Given 06/01/22 2243)  methylPREDNISolone sodium succinate (SOLU-MEDROL) 125 mg/2 mL injection 125 mg (125 mg Intravenous Given 06/01/22 2244)    ED Course/ Medical Decision Making/ A&P Clinical Course as of 06/02/22 0000  Wed Jun 01, 2022  2345 Scrotal ultrasound discussed with radiologist Dr. Sherrye Payor, who reports that there was sizable varicocele in the left hemiscrotum notable on initial ultrasound though not documented.  Additionally confirms read of right hydrocele.  Clinical exam consistent with this with "bag of worms" sensation on physical exam the left hemiscrotum and right scrotal exam consistent with hydrocele.  I appreciate his collaboration in the care of this patient. [RS]  2348 COPD exacerbation. Solumedrol, duo neb. Needs reassess after 2 duo neb. F/u on labs.  [GL]    Clinical Course User Index [GL] Loeffler, Adora Fridge, PA-C [RS] Yvonne Kendall Gypsy Balsam, PA-C                           Medical Decision Making 1 male with COPD who presents with concern for shortness of breath.  Hypertensive on intake, borderline O2 with oxygen 90% on room air.  Vital signs otherwise normal.  Cardiac exam is unremarkable, pulmonary exam as above with decreased air movement throughout and  wheezing throughout the lung fields bilaterally.  No respiratory distress.  GU exam as above with findings consistent with right-sided hydrocele and left-sided varicocele without evidence of infection.  No blood in the urethral meatus.  Different diagnosis for shortness of breath including limited to COPD exacerbation, CHF  exacerbation, ACS, PE, pleural effusion, pneumothorax.  Regarding his scrotal swelling DDx includes is limited to hydrocele, varicocele, epididymitis, scrotal abscess, inguinal hernia.  Amount and/or Complexity of Data Reviewed Labs: ordered.    Details: CBC without leukocytosis and with mildly elevated hemoglobin at 17.  BMP unremarkable, BNP mildly elevated to 267.  COVID test negative.  Hepatic function panel and troponin pending. Radiology: ordered.    Details:  Chest x-ray visualized this provider negative for acute cardiopulmonary disease and scrotal ultrasound with normal Doppler to bilateral testicles, right-sided hydrocele and after discussion with radiologist Dr. Sherrye Payor, left-sided varicocele consistent with exam.  Images visualized this provider.  ECG/medicine tests:     Details: EKG with normal sinus rhythm, no STEMI.  Risk Prescription drug management.   Administered Solu-Medrol and DuoNebs in the ED.  Some improvement with initial DuoNeb but will require further treatment.  Care of this patient signed out to oncoming ED provider Shirlee Limerick, PA-C at time of shift change.  All pertinent HPI, physical exam and laboratory findings were discussed with her prior to my departure.  Patient pending multiple DuoNebs and reevaluation.  Low threshold for admission, however patient may be able to be discharged home given reassuring vital signs at this time without respiratory distress.  Disposition ultimately to be determined by oncoming ED team pending reevaluation and completion of work-up. Regarding his urologic changes we will provide follow-up for urology in the outpatient  setting.  Maciah  voiced understanding of his medical evaluation and treatment plan. Each of their questions answered to their expressed satisfaction.  Return precautions were given.  Patient is well-appearing, stable, and was discharged in good condition.    This chart was dictated using voice recognition software, Dragon. Despite the best efforts of this provider to proofread and correct errors, errors may still occur which can change documentation meaning.         Final Clinical Impression(s) / ED Diagnoses Final diagnoses:  None    Rx / DC Orders ED Discharge Orders     None         Emeline Darling, PA-C 06/02/22 0006    Maudie Flakes, MD 06/02/22 [phone]

## 2022-06-04 LAB — RESP PANEL BY RT-PCR (FLU A&B, COVID) ARPGX2
Influenza A by PCR: NEGATIVE
Influenza B by PCR: NEGATIVE
SARS Coronavirus 2 by RT PCR: NEGATIVE

## 2022-07-11 ENCOUNTER — Encounter (HOSPITAL_COMMUNITY): Payer: Self-pay

## 2022-07-11 ENCOUNTER — Other Ambulatory Visit: Payer: Self-pay

## 2022-07-11 ENCOUNTER — Ambulatory Visit
Admission: RE | Admit: 2022-07-11 | Discharge: 2022-07-11 | Disposition: A | Discharge status: Home / Self Care | Payer: Medicare Other | Source: Ambulatory Visit | Attending: Physician Assistant | Admitting: Physician Assistant

## 2022-07-11 ENCOUNTER — Emergency Department (HOSPITAL_COMMUNITY)
Admission: EM | Admit: 2022-07-11 | Discharge: 2022-07-11 | Disposition: A | Discharge status: Home / Self Care | Payer: Medicare Other | Attending: Emergency Medicine | Admitting: Emergency Medicine

## 2022-07-11 ENCOUNTER — Ambulatory Visit (INDEPENDENT_AMBULATORY_CARE_PROVIDER_SITE_OTHER): Payer: Medicare Other

## 2022-07-11 VITALS — BP 127/77 | HR 82 | Temp 97.4°F | Resp 18

## 2022-07-11 DIAGNOSIS — M79642 Pain in left hand: Secondary | ICD-10-CM

## 2022-07-11 DIAGNOSIS — R17 Unspecified jaundice: Secondary | ICD-10-CM | POA: Diagnosis not present

## 2022-07-11 DIAGNOSIS — M25532 Pain in left wrist: Secondary | ICD-10-CM

## 2022-07-11 DIAGNOSIS — Z7901 Long term (current) use of anticoagulants: Secondary | ICD-10-CM | POA: Diagnosis not present

## 2022-07-11 DIAGNOSIS — S6292XA Unspecified fracture of left wrist and hand, initial encounter for closed fracture: Secondary | ICD-10-CM | POA: Diagnosis not present

## 2022-07-11 DIAGNOSIS — S52612A Displaced fracture of left ulna styloid process, initial encounter for closed fracture: Secondary | ICD-10-CM

## 2022-07-11 DIAGNOSIS — S6992XA Unspecified injury of left wrist, hand and finger(s), initial encounter: Secondary | ICD-10-CM | POA: Diagnosis present

## 2022-07-11 DIAGNOSIS — W19XXXA Unspecified fall, initial encounter: Secondary | ICD-10-CM | POA: Insufficient documentation

## 2022-07-11 DIAGNOSIS — S62102A Fracture of unspecified carpal bone, left wrist, initial encounter for closed fracture: Secondary | ICD-10-CM

## 2022-07-11 DIAGNOSIS — Z7982 Long term (current) use of aspirin: Secondary | ICD-10-CM | POA: Insufficient documentation

## 2022-07-11 DIAGNOSIS — Y92009 Unspecified place in unspecified non-institutional (private) residence as the place of occurrence of the external cause: Secondary | ICD-10-CM | POA: Diagnosis not present

## 2022-07-11 DIAGNOSIS — F1010 Alcohol abuse, uncomplicated: Secondary | ICD-10-CM | POA: Diagnosis not present

## 2022-07-11 DIAGNOSIS — Z7902 Long term (current) use of antithrombotics/antiplatelets: Secondary | ICD-10-CM | POA: Insufficient documentation

## 2022-07-11 LAB — LIPASE, BLOOD: Lipase: 58 U/L — ABNORMAL HIGH (ref 11–51)

## 2022-07-11 LAB — CBC
HCT: 43 % (ref 39.0–52.0)
Hemoglobin: 14.5 g/dL (ref 13.0–17.0)
MCH: 35.5 pg — ABNORMAL HIGH (ref 26.0–34.0)
MCHC: 33.7 g/dL (ref 30.0–36.0)
MCV: 105.1 fL — ABNORMAL HIGH (ref 80.0–100.0)
Platelets: 158 10*3/uL (ref 150–400)
RBC: 4.09 MIL/uL — ABNORMAL LOW (ref 4.22–5.81)
RDW: 13.8 % (ref 11.5–15.5)
WBC: 8.6 10*3/uL (ref 4.0–10.5)
nRBC: 0 % (ref 0.0–0.2)

## 2022-07-11 LAB — COMPREHENSIVE METABOLIC PANEL
ALT: 17 U/L (ref 0–44)
AST: 19 U/L (ref 15–41)
Albumin: 3.2 g/dL — ABNORMAL LOW (ref 3.5–5.0)
Alkaline Phosphatase: 54 U/L (ref 38–126)
Anion gap: 8 (ref 5–15)
BUN: 10 mg/dL (ref 8–23)
CO2: 26 mmol/L (ref 22–32)
Calcium: 8.4 mg/dL — ABNORMAL LOW (ref 8.9–10.3)
Chloride: 98 mmol/L (ref 98–111)
Creatinine, Ser: 1.12 mg/dL (ref 0.61–1.24)
GFR, Estimated: >60 mL/min (ref >60)
Glucose, Bld: 120 mg/dL — ABNORMAL HIGH (ref 70–99)
Potassium: 3.7 mmol/L (ref 3.5–5.1)
Sodium: 132 mmol/L — ABNORMAL LOW (ref 135–145)
Total Bilirubin: 1.1 mg/dL (ref 0.3–1.2)
Total Protein: 6.4 g/dL — ABNORMAL LOW (ref 6.5–8.1)

## 2022-07-11 NOTE — Progress Notes (Signed)
Orthopedic Tech Progress Note Patient Details:  JEURY MCNAB [birth year] [id number]  Ortho Devices Type of Ortho Device: Arm sling, Sugartong splint Ortho Device/Splint Location: lue Ortho Device/Splint Interventions: Ordered, Application, Adjustment   Post Interventions Patient Tolerated: Well Instructions Provided: Care of device, Adjustment of device  Karolee Stamps 07/11/2022, 10:26 PM

## 2022-07-11 NOTE — Discharge Instructions (Addendum)
You do have a fracture to your wrist, I would recommend follow up in the ED.

## 2022-07-11 NOTE — ED Notes (Signed)
Ortho at bedside.

## 2022-07-11 NOTE — ED Provider Triage Note (Signed)
Emergency Medicine Provider Triage Evaluation Note  Jonathan [name] , a 71 y.o. male  was evaluated in triage.  Pt complains of left wrist pain after a mechanical fall a few nights ago. Tripped while trying to get something in the middle of the night. UC told him it was fractured and sent him to the ER.   ED note also mentioned jaundice and hx of cirrhosis, wanted him to have blood work  Review of Systems  Positive: Left wrist pain Negative: Head trauma, LOC  Physical Exam  BP 120/82 (BP Location: Right Arm)   Pulse 73   Temp 97.9 F (36.6 C) (Oral)   Resp 18   SpO2 94%  Gen:   Awake, no distress   Resp:  Normal effort  MSK:   Moves extremities without difficulty  Other:  Neurovascularly in tact, bruising and swelling around left wrist  Medical Decision Making  Medically screening exam initiated at 2:28 PM.  Appropriate orders placed.  [name] was informed that the remainder of the evaluation will be completed by another provider, this initial triage assessment does not replace that evaluation, and the importance of remaining in the ED until their evaluation is complete.     Francies Inch T, PA-C 07/11/22 1429

## 2022-07-11 NOTE — ED Notes (Signed)
Patient is being discharged from the Urgent Care and sent to the Emergency Department via self . Per Wells Guiles, patient is in need of higher level of care due to jaundice. Patient is aware and verbalizes understanding of plan of care.  Vitals:   07/11/22 1123  BP: 127/77  Pulse: 82  Resp: 18  Temp: (!) 97.4 F (36.3 C)  SpO2: 92%

## 2022-07-11 NOTE — ED Triage Notes (Signed)
Pt reports mechanical fall at home on Thursday. Left hand and wrist is swollen.  Pt reports urgent care doing xray with +fx Pt on Blood thinners

## 2022-07-11 NOTE — ED Provider Notes (Signed)
[facility]    CSN: [id number] Arrival date & time: 07/11/22  1052      History   Chief Complaint Chief Complaint  Patient presents with   Hand Problem    Need x ray of hand to see what is wrong  fell on it 26 of Oct.can move fingers but swollen and bruised hurting with movement. - Entered by patient    HPI Jonathan [name] is a 71 y.o. male.   Patient here today for evaluation of left wrist injury that occurred 4 days ago.  He reports that he fell and states he has had continued pain and swelling since that time.  He notes most pain is present to his wrist but he does have significant swelling in his hand as well as bruising in his hand.  He notes that he is on blood thinner.  He denies any numbness or tingling.  He does not report treatment for symptoms.  The history is provided by the patient.    Past Medical History:  Diagnosis Date   Arthritis    hands    Chronic bronchitis (HCC)    Chronic sinusitis of both maxillary sinuses 04/16/2018   Sinus CT 04/25/2018 Mild mucoperiosteal thickening involving the frontoethmoidal sinuses. Otherwise largely clear sinuses with no evidence for acute sinusitis.    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 (HCC)    DOE (dyspnea on exertion) 04/16/2018   Essential hypertension, benign 09/23/2012   Changed acei to arb 04/16/2018 due to pseudowheeze > improved 06/08/2018    Hernia, inguinal 09/23/2012   Hyperlipidemia    has not  tolerated statins,  would not pursue PSK9 inhibitor as advised by Dr Claiborne Billings   Hypertension    Myocardial infarction [facility])    NSTEMI (non-ST elevated myocardial infarction) ([place]) 09/23/2012   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   Tobacco abuse     Patient Active Problem List   Diagnosis Date Noted   Cerebellar stroke ([place]) 10/25/2020   Cerebral embolism with cerebral infarction 10/21/2020   History of open heart surgery    Tachypnea    ETOH abuse    Tobacco abuse    Essential hypertension    Chronic obstructive pulmonary disease (HCC)    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   Coronary artery disease involving native coronary artery of native heart with unstable angina pectoris ([place])    Cigarette smoker 04/17/2018   DOE (dyspnea on exertion) 04/16/2018   Chronic sinusitis of both maxillary sinuses 04/16/2018   COPD GOLD II   active smoker 04/16/2018   Obesity (BMI 30.0-34.9) 12/16/2015   Tobacco abuse counseling 08/05/2014   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   Essential hypertension, benign 09/23/2012   NSTEMI (non-ST elevated myocardial infarction) ([place]) 09/23/2012   Hyperlipidemia 09/23/2012   Hernia, inguinal 09/23/2012    Past  Surgical History:  Procedure Laterality Date   CARDIAC CATHETERIZATION     CARDIOVASCULAR STRESS TEST  11/11/2011   CORONARY ANGIOPLASTY     stents- 01/2004    CORONARY ARTERY BYPASS GRAFT N/A 10/15/2020   Procedure: CORONARY ARTERY BYPASS GRAFTING (CABG) TIMES FOUR USING BILATERAL INTERNAL MAMMARY ARTERIES AND ENDOSCOPICALLY HARVESTED GREATER SAPHENOUS VEIN AND INSERTION OF 50CC INTRAAORTIC BALLOON PUMP;  Surgeon: [name], MD;  Location: [place];  Service: Open Heart Surgery;  Laterality: N/A;  possible BIMA   ENDOVEIN HARVEST OF GREATER SAPHENOUS VEIN Right 10/15/2020   Procedure: ENDOVEIN HARVEST OF GREATER SAPHENOUS VEIN;  Surgeon: [name], MD;  Location: [place];  Service: Open Heart Surgery;  Laterality: Right;   heart stents  01/2004   INGUINAL  HERNIA REPAIR Right 12/06/2012   Procedure: HERNIA REPAIR INGUINAL ADULT;  Surgeon: [name], DO;  Location: [facility];  Service: General;  Laterality: Right;   INSERTION OF MESH Right 12/06/2012   Procedure: INSERTION OF MESH;  Surgeon: [name], DO;  Location: [facility];  Service: General;  Laterality: Right;   LEFT HEART CATH AND CORONARY ANGIOGRAPHY N/A 07/22/2019   Procedure: LEFT HEART CATH AND CORONARY ANGIOGRAPHY;  Surgeon: [name], MD;  Location: [place] [facility];  Service: Cardiovascular;  Laterality: N/A;   LEFT HEART CATH AND CORONARY ANGIOGRAPHY N/A 10/14/2020   Procedure: LEFT HEART CATH AND CORONARY ANGIOGRAPHY;  Surgeon: [name], MD;  Location: [place] [facility];  Service: Cardiovascular;  Laterality: N/A;   TEE WITHOUT CARDIOVERSION N/A 10/15/2020   Procedure: TRANSESOPHAGEAL ECHOCARDIOGRAM (TEE);  Surgeon: [name], MD;  Location: [place];  Service: Open Heart Surgery;  Laterality: N/A;       Home Medications    Prior to Admission medications   Medication Sig Start Date End Date Taking? Authorizing Provider  acetaminophen (TYLENOL) 325 MG tablet Take 1-2 tablets (325-650 mg total) by mouth every 4 (four) hours as needed for mild pain. Patient not taking: Reported on 09/23/2021 11/04/20   Love, Ivan Anchors, PA-C  albuterol (VENTOLIN HFA) 108 (90 Base) MCG/ACT inhaler Inhale 1-2 puffs into the lungs every 6 (six) hours as needed for wheezing or shortness of breath. Patient not taking: Reported on 09/23/2021 03/11/21   [name], MD  aspirin 81 MG chewable tablet Chew 1 tablet (81 mg total) by mouth daily. 11/04/20   Love, Ivan Anchors, PA-C  clopidogrel (PLAVIX) 75 MG tablet TAKE 1 TABLET BY MOUTH  DAILY 01/17/22   [name], MD  guaiFENesin 200 MG tablet Take 1 tablet (200 mg total) by mouth every 6 (six) hours as needed for cough or to loosen phlegm. Patient not taking: Reported on 09/23/2021 11/04/20   Bary Leriche, PA-C  metoprolol succinate  (TOPROL-XL) 25 MG 24 hr tablet TAKE 1 AND 1/2 TABLETS BY  MOUTH DAILY 01/17/22   [name], MD  nicotine polacrilex (NICORETTE) 2 MG gum Take 2 mg by mouth as needed for smoking cessation. Patient not taking: Reported on 09/23/2021    [provider]  nitroGLYCERIN (NITROSTAT) 0.4 MG SL tablet PLACE 1 TABLET UNDER THE TONGUE EVERY 5 MINUTES AS NEEDED FOR CHEST PAIN UP TO 3 DOSES, IF SYMPTOMS PERSIST CALL 911 09/07/20   [name], MD  REPATHA SURECLICK 542 MG/ML SOAJ INJECT 140 MG INTO THE SKIN EVERY 14 DAYS 12/07/21   [name], MD  sodium chloride (OCEAN) 0.65 % SOLN nasal spray Place 1  spray into both nostrils 2 (two) times daily at 8 am and 10 pm. Patient not taking: Reported on 09/23/2021 11/04/20   Bary Leriche, PA-C  SYMBICORT 160-4.5 MCG/ACT inhaler INHALE 2 PUFFS INTO THE LUNGS TWICE DAILY Patient not taking: Reported on 09/23/2021 09/10/21   Martyn Ehrich, NP    Family History Family History  Problem Relation Age of Onset   CAD Father        CABG with subsequent stroke    Social History Social History   Tobacco Use   Smoking status: Former    Packs/day: 1.00    Years: 53.00    Total pack years: 53.00    Types: Cigarettes    Quit date: 10/14/2020    Years since quitting: 1.7   Smokeless tobacco: Never  Vaping Use   Vaping Use: Never used  Substance Use Topics   Alcohol use: Yes    Alcohol/week: 5.0 standard drinks of alcohol    Types: 5 Standard drinks or equivalent per week    Comment: consumes 1 pint per day    Drug use: No     Allergies   Niaspan [niacin er] and Statins   Review of Systems Review of Systems  Constitutional:  Negative for chills and fever.  Eyes:  Negative for discharge and redness.  Musculoskeletal:  Positive for arthralgias.  Skin:  Positive for color change. Negative for wound.  Neurological:  Negative for numbness.     Physical Exam Triage Vital Signs ED Triage Vitals [07/11/22 1123]  Enc Vitals Group      BP 127/77     Pulse Rate 82     Resp 18     Temp (!) 97.4 F (36.3 C)     Temp Source Oral     SpO2 92 %     Weight      Height      Head Circumference      Peak Flow      Pain Score 3     Pain Loc      Pain Edu?      Excl. in [place]?    No data found.  Updated Vital Signs BP 127/77 (BP Location: Left Arm)   Pulse 82   Temp (!) 97.4 F (36.3 C) (Oral)   Resp 18   SpO2 92%      Physical Exam Vitals and nursing note reviewed.  Constitutional:      General: He is not in acute distress.    Appearance: Normal appearance. He is not ill-appearing.  HENT:     Head: Normocephalic and atraumatic.  Eyes:     General: Scleral icterus (mild) present.     Conjunctiva/sclera: Conjunctivae normal.  Cardiovascular:     Rate and Rhythm: Normal rate.  Pulmonary:     Effort: Pulmonary effort is normal.  Musculoskeletal:     Comments: Decreased ROM of left wrist due to pain with movement, Full ROM of left fingers  Skin:    Capillary Refill: Mildly increased cap refill time to left fingers    Comments: Bruising noted to left dorsal hand, fingers. Jaundice noted  Neurological:     Mental Status: He is alert.     Comments: Gross sensation intact to left fingers distally  Psychiatric:        Mood and Affect: Mood normal.        Behavior: Behavior normal.        Thought Content: Thought content normal.  UC Treatments / Results  Labs (all labs ordered are listed, but only abnormal results are displayed) Labs Reviewed - No data to display  EKG   Radiology DG Hand Complete Left  Result Date: 07/11/2022 CLINICAL DATA:  Pain, swelling, and bruising after a fall at home EXAM: LEFT HAND - COMPLETE 3+ VIEW COMPARISON:  None Available. FINDINGS: Scattered narrowing of IP joints greatest at PIP joint little finger. Remaining joint spaces preserved. Bones demineralized. Diffuse soft tissue swelling. Fracture identified at tip of ulnar styloid process. No additional fracture, or bone  destruction. IMPRESSION: Minimally displaced fracture at tip of LEFT ulnar styloid process. Osseous demineralization with scattered degenerative changes of IP joints. No additional osseous abnormalities. Electronically Signed   By: Lavonia Dana M.D.   On: 07/11/2022 12:01   DG Wrist Complete Left  Result Date: 07/11/2022 CLINICAL DATA:  LEFT hand swelling and bruising, pain LEFT wrist and hand, fell at home EXAM: LEFT WRIST - COMPLETE 3+ VIEW COMPARISON:  None FINDINGS: Osseous demineralization. Wrist joint spaces preserved. Significant soft tissue swelling at dorsum LEFT hand, to lesser degree throughout LEFT hand, wrists, and distal forearm. Small minimally displaced fracture at tip of ulnar styloid. Questionable nondisplaced fracture at dorsal margin of distal LEFT radial metaphysis where a cortical discontinuity is seen. No additional fracture or dislocation. IMPRESSION: Displaced fracture at tip of LEFT ulnar styloid process. Questionable nondisplaced metaphyseal fracture at dorsal margin distal LEFT radius. Electronically Signed   By: Lavonia Dana M.D.   On: 07/11/2022 11:59    Procedures Procedures (including critical care time)  Medications Ordered in UC Medications - No data to display  Initial Impression / Assessment and Plan / UC Course  I have reviewed the triage vital signs and the nursing notes.  Pertinent labs & imaging results that were available during my care of the patient were reviewed by me and considered in my medical decision making (see chart for details).    Given fracture with known anticoagulation, significant bruising and swelling, mildly increased cap refill time, jaundice and history of ETOH abuse recommended further evaluation in the ED. Patient did have labs in ED last month and had bilirubin that was elevated with no apparent follow up in chart. Patient is agreeable to report to ED- wife is here with him for transport.   Final Clinical Impressions(s) / UC Diagnoses    Final diagnoses:  Closed displaced fracture of styloid process of left ulna, initial encounter  Jaundice  ETOH abuse  Current use of long term anticoagulation     Discharge Instructions      You do have a fracture to your wrist, I would recommend follow up in the ED.     ED Prescriptions   None    PDMP not reviewed this encounter.   [name], PA-C 07/11/22 1419

## 2022-07-11 NOTE — ED Provider Notes (Signed)
[facility] DEPT Provider Note   CSN: [id number] Arrival date & time: 07/11/22  1359     History  Chief Complaint  Patient presents with   Wrist Pain    Jonathan [name] is a 71 y.o. male.   Wrist Pain  Patient sent in from urgent care with wrist fracture.  Had fall on Thursday with today being Monday.  Left wrist pain since.  Swelling.  Positive ulnar styloid and potential distal radius fractures.  Is on blood thinners.  Did not hit head.  Sent in because patient reportedly jaundiced.  Lab work reviewed from previous labs and had bilirubin of elevated 2.2.     Home Medications Prior to Admission medications   Medication Sig Start Date End Date Taking? Authorizing Provider  acetaminophen (TYLENOL) 325 MG tablet Take 1-2 tablets (325-650 mg total) by mouth every 4 (four) hours as needed for mild pain. Patient not taking: Reported on 09/23/2021 11/04/20   Love, Ivan Anchors, PA-C  albuterol (VENTOLIN HFA) 108 (90 Base) MCG/ACT inhaler Inhale 1-2 puffs into the lungs every 6 (six) hours as needed for wheezing or shortness of breath. Patient not taking: Reported on 09/23/2021 03/11/21   [name], MD  aspirin 81 MG chewable tablet Chew 1 tablet (81 mg total) by mouth daily. 11/04/20   Love, Ivan Anchors, PA-C  clopidogrel (PLAVIX) 75 MG tablet TAKE 1 TABLET BY MOUTH  DAILY 01/17/22   [name], MD  guaiFENesin 200 MG tablet Take 1 tablet (200 mg total) by mouth every 6 (six) hours as needed for cough or to loosen phlegm. Patient not taking: Reported on 09/23/2021 11/04/20   Bary Leriche, PA-C  metoprolol succinate (TOPROL-XL) 25 MG 24 hr tablet TAKE 1 AND 1/2 TABLETS BY  MOUTH DAILY 01/17/22   [name], MD  nicotine polacrilex (NICORETTE) 2 MG gum Take 2 mg by mouth as needed for smoking cessation. Patient not taking: Reported on 09/23/2021    [provider]  nitroGLYCERIN (NITROSTAT) 0.4 MG SL tablet PLACE 1 TABLET UNDER THE TONGUE EVERY 5  MINUTES AS NEEDED FOR CHEST PAIN UP TO 3 DOSES, IF SYMPTOMS PERSIST CALL 911 09/07/20   [name], MD  REPATHA SURECLICK 341 MG/ML SOAJ INJECT 140 MG INTO THE SKIN EVERY 14 DAYS 12/07/21   [name], MD  sodium chloride (OCEAN) 0.65 % SOLN nasal spray Place 1 spray into both nostrils 2 (two) times daily at 8 am and 10 pm. Patient not taking: Reported on 09/23/2021 11/04/20   Bary Leriche, PA-C  SYMBICORT 160-4.5 MCG/ACT inhaler INHALE 2 PUFFS INTO THE LUNGS TWICE DAILY Patient not taking: Reported on 09/23/2021 09/10/21   Martyn Ehrich, NP      Allergies    Niaspan [niacin er] and Statins    Review of Systems   Review of Systems  Physical Exam Updated Vital Signs BP (!) 153/89 (BP Location: Left Arm)   Pulse 66   Temp 98.3 F (36.8 C) (Oral)   Resp 20   SpO2 100%  Physical Exam Vitals and nursing note reviewed.  Eyes:     General: No scleral icterus. Cardiovascular:     Rate and Rhythm: Normal rate.  Musculoskeletal:     Cervical back: Neck supple.     Comments: Tenderness over distal radius and ulna with swelling and ecchymosis over hand wrist and somewhat forearm.  Skin intact.  There is bruising around the area.  Skin:  Capillary Refill: Capillary refill takes less than 2 seconds.     Coloration: Skin is not jaundiced.     ED Results / Procedures / Treatments   Labs (all labs ordered are listed, but only abnormal results are displayed) Labs Reviewed  CBC - Abnormal; Notable for the following components:      Result Value   RBC 4.09 (*)    MCV 105.1 (*)    MCH 35.5 (*)    All other components within normal limits  COMPREHENSIVE METABOLIC PANEL - Abnormal; Notable for the following components:   Sodium 132 (*)    Glucose, Bld 120 (*)    Calcium 8.4 (*)    Total Protein 6.4 (*)    Albumin 3.2 (*)    All other components within normal limits  LIPASE, BLOOD - Abnormal; Notable for the following components:   Lipase 58 (*)    All other components  within normal limits    EKG None  Radiology DG Hand Complete Left  Result Date: 07/11/2022 CLINICAL DATA:  Pain, swelling, and bruising after a fall at home EXAM: LEFT HAND - COMPLETE 3+ VIEW COMPARISON:  None Available. FINDINGS: Scattered narrowing of IP joints greatest at PIP joint little finger. Remaining joint spaces preserved. Bones demineralized. Diffuse soft tissue swelling. Fracture identified at tip of ulnar styloid process. No additional fracture, or bone destruction. IMPRESSION: Minimally displaced fracture at tip of LEFT ulnar styloid process. Osseous demineralization with scattered degenerative changes of IP joints. No additional osseous abnormalities. Electronically Signed   By: Lavonia Dana M.D.   On: 07/11/2022 12:01   DG Wrist Complete Left  Result Date: 07/11/2022 CLINICAL DATA:  LEFT hand swelling and bruising, pain LEFT wrist and hand, fell at home EXAM: LEFT WRIST - COMPLETE 3+ VIEW COMPARISON:  None FINDINGS: Osseous demineralization. Wrist joint spaces preserved. Significant soft tissue swelling at dorsum LEFT hand, to lesser degree throughout LEFT hand, wrists, and distal forearm. Small minimally displaced fracture at tip of ulnar styloid. Questionable nondisplaced fracture at dorsal margin of distal LEFT radial metaphysis where a cortical discontinuity is seen. No additional fracture or dislocation. IMPRESSION: Displaced fracture at tip of LEFT ulnar styloid process. Questionable nondisplaced metaphyseal fracture at dorsal margin distal LEFT radius. Electronically Signed   By: Lavonia Dana M.D.   On: 07/11/2022 11:59    Procedures Procedures    Medications Ordered in ED Medications - No data to display  ED Course/ Medical Decision Making/ A&P                           Medical Decision Making  Patient sent from urgent care for fracture and jaundice.  Reportedly alcohol use.  Patient is actually not diffusely jaundiced just some jaundice/bruising on the arm likely  from the blood breakdown.  Has potentially some muscle jaundice on the other side from ecchymosis.  No scleral icterus however.  Lab work is reassuring.  Bilirubin normal.  Will have follow-up with hand surgery.  Immobilized.  Discharge home.        Final Clinical Impression(s) / ED Diagnoses Final diagnoses:  Closed fracture of left wrist, initial encounter    Rx / DC Orders ED Discharge Orders     None         Davonna Belling, MD 07/11/22 2158

## 2022-07-11 NOTE — ED Notes (Signed)
Ortho tech notified of need for splint

## 2022-07-11 NOTE — ED Triage Notes (Signed)
Pt c/o fall at home on Thursday. Left hand is swollen and bruised. Pain in left wrist and hand.

## 2022-09-30 ENCOUNTER — Other Ambulatory Visit (HOSPITAL_COMMUNITY): Payer: Self-pay

## 2022-10-29 ENCOUNTER — Encounter (HOSPITAL_COMMUNITY): Payer: Self-pay | Admitting: Emergency Medicine

## 2022-10-29 ENCOUNTER — Ambulatory Visit
Admission: EM | Admit: 2022-10-29 | Discharge: 2022-10-29 | Payer: Medicare Other | Attending: Physician Assistant | Admitting: Physician Assistant

## 2022-10-29 ENCOUNTER — Inpatient Hospital Stay (HOSPITAL_COMMUNITY)
Admission: EM | Admit: 2022-10-29 | Discharge: 2022-11-07 | DRG: 871 | Disposition: A | Discharge status: Home / Self Care | Payer: Medicare Other | Attending: Internal Medicine | Admitting: Internal Medicine

## 2022-10-29 ENCOUNTER — Emergency Department (HOSPITAL_COMMUNITY): Payer: Medicare Other

## 2022-10-29 ENCOUNTER — Other Ambulatory Visit: Payer: Self-pay

## 2022-10-29 ENCOUNTER — Encounter: Payer: Self-pay | Admitting: Emergency Medicine

## 2022-10-29 DIAGNOSIS — J440 Chronic obstructive pulmonary disease with acute lower respiratory infection: Secondary | ICD-10-CM | POA: Diagnosis present

## 2022-10-29 DIAGNOSIS — J9621 Acute and chronic respiratory failure with hypoxia: Secondary | ICD-10-CM

## 2022-10-29 DIAGNOSIS — Z888 Allergy status to other drugs, medicaments and biological substances status: Secondary | ICD-10-CM

## 2022-10-29 DIAGNOSIS — Z823 Family history of stroke: Secondary | ICD-10-CM

## 2022-10-29 DIAGNOSIS — E872 Acidosis, unspecified: Secondary | ICD-10-CM | POA: Diagnosis present

## 2022-10-29 DIAGNOSIS — Z7982 Long term (current) use of aspirin: Secondary | ICD-10-CM

## 2022-10-29 DIAGNOSIS — Z6832 Body mass index (BMI) 32.0-32.9, adult: Secondary | ICD-10-CM | POA: Diagnosis not present

## 2022-10-29 DIAGNOSIS — E669 Obesity, unspecified: Secondary | ICD-10-CM | POA: Diagnosis present

## 2022-10-29 DIAGNOSIS — Z951 Presence of aortocoronary bypass graft: Secondary | ICD-10-CM

## 2022-10-29 DIAGNOSIS — A403 Sepsis due to Streptococcus pneumoniae: Secondary | ICD-10-CM | POA: Diagnosis present

## 2022-10-29 DIAGNOSIS — Z79899 Other long term (current) drug therapy: Secondary | ICD-10-CM | POA: Diagnosis not present

## 2022-10-29 DIAGNOSIS — Z66 Do not resuscitate: Secondary | ICD-10-CM | POA: Diagnosis present

## 2022-10-29 DIAGNOSIS — E871 Hypo-osmolality and hyponatremia: Secondary | ICD-10-CM | POA: Diagnosis present

## 2022-10-29 DIAGNOSIS — Z7902 Long term (current) use of antithrombotics/antiplatelets: Secondary | ICD-10-CM

## 2022-10-29 DIAGNOSIS — I25110 Atherosclerotic heart disease of native coronary artery with unstable angina pectoris: Secondary | ICD-10-CM | POA: Diagnosis present

## 2022-10-29 DIAGNOSIS — Z1152 Encounter for screening for COVID-19: Secondary | ICD-10-CM | POA: Diagnosis not present

## 2022-10-29 DIAGNOSIS — I2510 Atherosclerotic heart disease of native coronary artery without angina pectoris: Secondary | ICD-10-CM | POA: Diagnosis present

## 2022-10-29 DIAGNOSIS — A491 Streptococcal infection, unspecified site: Secondary | ICD-10-CM | POA: Insufficient documentation

## 2022-10-29 DIAGNOSIS — J441 Chronic obstructive pulmonary disease with (acute) exacerbation: Secondary | ICD-10-CM

## 2022-10-29 DIAGNOSIS — R600 Localized edema: Secondary | ICD-10-CM | POA: Diagnosis present

## 2022-10-29 DIAGNOSIS — Z7951 Long term (current) use of inhaled steroids: Secondary | ICD-10-CM

## 2022-10-29 DIAGNOSIS — E785 Hyperlipidemia, unspecified: Secondary | ICD-10-CM | POA: Diagnosis present

## 2022-10-29 DIAGNOSIS — R739 Hyperglycemia, unspecified: Secondary | ICD-10-CM | POA: Diagnosis not present

## 2022-10-29 DIAGNOSIS — Z9861 Coronary angioplasty status: Secondary | ICD-10-CM

## 2022-10-29 DIAGNOSIS — Z8249 Family history of ischemic heart disease and other diseases of the circulatory system: Secondary | ICD-10-CM

## 2022-10-29 DIAGNOSIS — J189 Pneumonia, unspecified organism: Principal | ICD-10-CM | POA: Diagnosis present

## 2022-10-29 DIAGNOSIS — I252 Old myocardial infarction: Secondary | ICD-10-CM

## 2022-10-29 DIAGNOSIS — I10 Essential (primary) hypertension: Secondary | ICD-10-CM | POA: Diagnosis present

## 2022-10-29 DIAGNOSIS — R6520 Severe sepsis without septic shock: Secondary | ICD-10-CM | POA: Diagnosis present

## 2022-10-29 DIAGNOSIS — A419 Sepsis, unspecified organism: Secondary | ICD-10-CM | POA: Diagnosis not present

## 2022-10-29 DIAGNOSIS — Z87891 Personal history of nicotine dependence: Secondary | ICD-10-CM

## 2022-10-29 DIAGNOSIS — J9601 Acute respiratory failure with hypoxia: Secondary | ICD-10-CM | POA: Diagnosis present

## 2022-10-29 DIAGNOSIS — D72829 Elevated white blood cell count, unspecified: Secondary | ICD-10-CM | POA: Diagnosis present

## 2022-10-29 LAB — COMPREHENSIVE METABOLIC PANEL
ALT: 14 U/L (ref 0–44)
AST: 37 U/L (ref 15–41)
Albumin: 2.9 g/dL — ABNORMAL LOW (ref 3.5–5.0)
Alkaline Phosphatase: 64 U/L (ref 38–126)
Anion gap: 15 (ref 5–15)
BUN: 25 mg/dL — ABNORMAL HIGH (ref 8–23)
CO2: 25 mmol/L (ref 22–32)
Calcium: 8.1 mg/dL — ABNORMAL LOW (ref 8.9–10.3)
Chloride: 91 mmol/L — ABNORMAL LOW (ref 98–111)
Creatinine, Ser: 1.22 mg/dL (ref 0.61–1.24)
GFR, Estimated: >60 mL/min (ref >60)
Glucose, Bld: 115 mg/dL — ABNORMAL HIGH (ref 70–99)
Potassium: 3.6 mmol/L (ref 3.5–5.1)
Sodium: 131 mmol/L — ABNORMAL LOW (ref 135–145)
Total Bilirubin: 1.3 mg/dL — ABNORMAL HIGH (ref 0.3–1.2)
Total Protein: 7.1 g/dL (ref 6.5–8.1)

## 2022-10-29 LAB — CBC WITH DIFFERENTIAL/PLATELET
Abs Immature Granulocytes: 0.52 10*3/uL — ABNORMAL HIGH (ref 0.00–0.07)
Basophils Absolute: 0.2 10*3/uL — ABNORMAL HIGH (ref 0.0–0.1)
Basophils Relative: 1 %
Eosinophils Absolute: 0 10*3/uL (ref 0.0–0.5)
Eosinophils Relative: 0 %
HCT: 46.4 % (ref 39.0–52.0)
Hemoglobin: 15.1 g/dL (ref 13.0–17.0)
Immature Granulocytes: 2 %
Lymphocytes Relative: 1 %
Lymphs Abs: 0.4 10*3/uL — ABNORMAL LOW (ref 0.7–4.0)
MCH: 32.5 pg (ref 26.0–34.0)
MCHC: 32.5 g/dL (ref 30.0–36.0)
MCV: 100 fL (ref 80.0–100.0)
Monocytes Absolute: 0.8 10*3/uL (ref 0.1–1.0)
Monocytes Relative: 3 %
Neutro Abs: 26.1 10*3/uL — ABNORMAL HIGH (ref 1.7–7.7)
Neutrophils Relative %: 93 %
Platelets: 189 10*3/uL (ref 150–400)
RBC: 4.64 MIL/uL (ref 4.22–5.81)
RDW: 14 % (ref 11.5–15.5)
WBC: 28 10*3/uL — ABNORMAL HIGH (ref 4.0–10.5)
nRBC: 0 % (ref 0.0–0.2)

## 2022-10-29 LAB — URINALYSIS, ROUTINE W REFLEX MICROSCOPIC
Bilirubin Urine: NEGATIVE
Glucose, UA: NEGATIVE mg/dL
Ketones, ur: 5 mg/dL — AB
Leukocytes,Ua: NEGATIVE
Nitrite: NEGATIVE
Protein, ur: 100 mg/dL — AB
Specific Gravity, Urine: 1.02 (ref 1.005–1.030)
pH: 5 (ref 5.0–8.0)

## 2022-10-29 LAB — LACTIC ACID, PLASMA
Lactic Acid, Venous: 3.5 mmol/L (ref 0.5–1.9)
Lactic Acid, Venous: 6.3 mmol/L (ref 0.5–1.9)
Lactic Acid, Venous: 4.4 mmol/L (ref 0.5–1.9)
Lactic Acid, Venous: 3 mmol/L (ref 0.5–1.9)

## 2022-10-29 LAB — RESP PANEL BY RT-PCR (RSV, FLU A&B, COVID)  RVPGX2
Influenza A by PCR: NEGATIVE
Influenza B by PCR: NEGATIVE
Resp Syncytial Virus by PCR: NEGATIVE
SARS Coronavirus 2 by RT PCR: NEGATIVE

## 2022-10-29 LAB — MRSA NEXT GEN BY PCR, NASAL: MRSA by PCR Next Gen: NOT DETECTED

## 2022-10-29 LAB — APTT: aPTT: 30 seconds (ref 24–36)

## 2022-10-29 LAB — PROTIME-INR
INR: 1.4 — ABNORMAL HIGH (ref 0.8–1.2)
Prothrombin Time: 17.1 seconds — ABNORMAL HIGH (ref 11.4–15.2)

## 2022-10-29 LAB — CBG MONITORING, ED: Glucose-Capillary: 155 mg/dL — ABNORMAL HIGH (ref 70–99)

## 2022-10-29 LAB — HEMOGLOBIN A1C
Hgb A1c MFr Bld: 5.1 % (ref 4.8–5.6)
Mean Plasma Glucose: 99.67 mg/dL

## 2022-10-29 MED ORDER — LACTATED RINGERS IV SOLN: INTRAVENOUS | Status: AC

## 2022-10-29 MED ORDER — ALBUTEROL SULFATE (2.5 MG/3ML) 0.083% IN NEBU: 2.5000 mg | INHALATION_SOLUTION | RESPIRATORY_TRACT | Status: DC | PRN

## 2022-10-29 MED ORDER — TRAZODONE HCL 50 MG PO TABS
25.0000 mg | ORAL_TABLET | Freq: Every evening | ORAL | Status: DC | PRN
  Administered 2022-10-30 – 2022-11-02 (×5): 25 mg via ORAL
  Filled 2022-10-29 (×5): qty 1

## 2022-10-29 MED ORDER — INSULIN ASPART 100 UNIT/ML IJ SOLN
0.0000 [IU] | Freq: Every day | INTRAMUSCULAR | Status: DC
  Filled 2022-10-29: qty 0.05

## 2022-10-29 MED ORDER — SODIUM CHLORIDE 0.9 % IV SOLN
2.0000 g | INTRAVENOUS | Status: AC
  Administered 2022-10-29 – 2022-11-02 (×5): 2 g via INTRAVENOUS
  Filled 2022-10-29 (×5): qty 20

## 2022-10-29 MED ORDER — ACETAMINOPHEN 325 MG PO TABS
650.0000 mg | ORAL_TABLET | Freq: Four times a day (QID) | ORAL | Status: DC | PRN
  Filled 2022-10-29: qty 2

## 2022-10-29 MED ORDER — ASPIRIN 81 MG PO CHEW
81.0000 mg | CHEWABLE_TABLET | Freq: Every day | ORAL | Status: DC
  Administered 2022-10-29 – 2022-11-07 (×10): 81 mg via ORAL
  Filled 2022-10-29 (×10): qty 1

## 2022-10-29 MED ORDER — METOPROLOL SUCCINATE ER 25 MG PO TB24
37.5000 mg | ORAL_TABLET | Freq: Every day | ORAL | Status: DC
  Administered 2022-10-29 – 2022-11-02 (×5): 37.5 mg via ORAL
  Filled 2022-10-29 (×5): qty 2

## 2022-10-29 MED ORDER — SODIUM CHLORIDE 0.9 % IV BOLUS (SEPSIS)
1000.0000 mL | Freq: Once | INTRAVENOUS | Status: AC
  Administered 2022-10-29: 1000 mL via INTRAVENOUS

## 2022-10-29 MED ORDER — ALBUTEROL SULFATE (2.5 MG/3ML) 0.083% IN NEBU
5.0000 mg | INHALATION_SOLUTION | Freq: Once | RESPIRATORY_TRACT | Status: AC
  Administered 2022-10-29: 5 mg via RESPIRATORY_TRACT
  Filled 2022-10-29: qty 6

## 2022-10-29 MED ORDER — SODIUM CHLORIDE 0.9 % IV BOLUS
1000.0000 mL | Freq: Once | INTRAVENOUS | Status: AC
  Administered 2022-10-29: 1000 mL via INTRAVENOUS

## 2022-10-29 MED ORDER — SODIUM CHLORIDE 0.9 % IV SOLN
500.0000 mg | INTRAVENOUS | Status: DC
  Administered 2022-10-29 – 2022-10-31 (×3): 500 mg via INTRAVENOUS
  Filled 2022-10-29 (×3): qty 5

## 2022-10-29 MED ORDER — CLOPIDOGREL BISULFATE 75 MG PO TABS
75.0000 mg | ORAL_TABLET | Freq: Every day | ORAL | Status: DC
  Administered 2022-10-30 – 2022-11-07 (×9): 75 mg via ORAL
  Filled 2022-10-29 (×9): qty 1

## 2022-10-29 MED ORDER — DIPHENHYDRAMINE HCL 50 MG/ML IJ SOLN
12.5000 mg | Freq: Once | INTRAMUSCULAR | Status: AC
  Administered 2022-10-29: 12.5 mg via INTRAVENOUS
  Filled 2022-10-29: qty 1

## 2022-10-29 MED ORDER — ONDANSETRON HCL 4 MG/2ML IJ SOLN: 4.0000 mg | Freq: Four times a day (QID) | INTRAMUSCULAR | Status: DC | PRN

## 2022-10-29 MED ORDER — OXYCODONE HCL 5 MG PO TABS: 5.0000 mg | ORAL_TABLET | ORAL | Status: DC | PRN

## 2022-10-29 MED ORDER — ONDANSETRON HCL 4 MG PO TABS: 4.0000 mg | ORAL_TABLET | Freq: Four times a day (QID) | ORAL | Status: DC | PRN

## 2022-10-29 MED ORDER — IPRATROPIUM BROMIDE 0.02 % IN SOLN
0.5000 mg | Freq: Once | RESPIRATORY_TRACT | Status: AC
  Administered 2022-10-29: 0.5 mg via RESPIRATORY_TRACT
  Filled 2022-10-29: qty 2.5

## 2022-10-29 MED ORDER — POLYETHYLENE GLYCOL 3350 17 G PO PACK
17.0000 g | PACK | Freq: Every day | ORAL | Status: DC | PRN
  Administered 2022-10-31: 17 g via ORAL
  Filled 2022-10-29: qty 1

## 2022-10-29 MED ORDER — INSULIN ASPART 100 UNIT/ML IJ SOLN
0.0000 [IU] | Freq: Three times a day (TID) | INTRAMUSCULAR | Status: DC
  Administered 2022-10-29: 3 [IU] via SUBCUTANEOUS
  Administered 2022-10-30 – 2022-11-06 (×8): 2 [IU] via SUBCUTANEOUS
  Filled 2022-10-29: qty 0.15

## 2022-10-29 MED ORDER — ACETAMINOPHEN 650 MG RE SUPP: 650.0000 mg | Freq: Four times a day (QID) | RECTAL | Status: DC | PRN

## 2022-10-29 MED ORDER — ENOXAPARIN SODIUM 40 MG/0.4ML IJ SOSY
40.0000 mg | PREFILLED_SYRINGE | INTRAMUSCULAR | Status: DC
  Administered 2022-10-29 – 2022-11-06 (×9): 40 mg via SUBCUTANEOUS
  Filled 2022-10-29 (×9): qty 0.4

## 2022-10-29 MED ORDER — METHYLPREDNISOLONE SODIUM SUCC 40 MG IJ SOLR
40.0000 mg | Freq: Three times a day (TID) | INTRAMUSCULAR | Status: DC
  Administered 2022-10-29 – 2022-11-03 (×15): 40 mg via INTRAVENOUS
  Filled 2022-10-29 (×16): qty 1

## 2022-10-29 MED ORDER — DOCUSATE SODIUM 100 MG PO CAPS
100.0000 mg | ORAL_CAPSULE | Freq: Two times a day (BID) | ORAL | Status: DC
  Administered 2022-10-29 – 2022-11-01 (×7): 100 mg via ORAL
  Filled 2022-10-29 (×7): qty 1

## 2022-10-29 MED ORDER — IPRATROPIUM-ALBUTEROL 0.5-2.5 (3) MG/3ML IN SOLN
3.0000 mL | Freq: Four times a day (QID) | RESPIRATORY_TRACT | Status: DC
  Administered 2022-10-29 – 2022-11-03 (×18): 3 mL via RESPIRATORY_TRACT
  Filled 2022-10-29 (×19): qty 3

## 2022-10-29 MED ORDER — IPRATROPIUM-ALBUTEROL 0.5-2.5 (3) MG/3ML IN SOLN
3.0000 mL | Freq: Once | RESPIRATORY_TRACT | Status: AC
  Administered 2022-10-29: 3 mL via RESPIRATORY_TRACT

## 2022-10-29 NOTE — ED Notes (Signed)
Pt has increased work of breathing. Resp at bedside but will make provider aware and of possible need for BiPap

## 2022-10-29 NOTE — Progress Notes (Signed)
       Overnight   NAME: Jonathan [name] MRN: [id number] DOB : 06-09-1951  CODE STATUS Note  CPR -      NO ACLS -    NO             BiPAP- yes Vent/ETT - NO Vasopressors- NO  Patient acknowledges and agrees  Date of Service   10/29/2022   HPI/Events of Note   Notified by RN for Patient request to be DNR CODE status Patient reports being DNR in the past and wishes to be DNR currently. Bedside visit with Patient reveals Patient awake and oriented x4  Oriented to : Name -    yes Place -     yes Year -       yes Location - yes  Patient is aware of ramifications of his decision and notes that he has been DNR in the past by his discretion.   Interventions/ Plan   Patient is now DNR      Gershon Cull BSN MSNA MSN ACNPC-AG Acute Care Nurse Practitioner [place]

## 2022-10-29 NOTE — ED Notes (Signed)
ED TO INPATIENT HANDOFF REPORT  ED Nurse Name and Phone #: Jonathan [name] [phone]  S Name/Age/Gender Jonathan [name] 72 y.o. male Room/Bed: WA21/WA21  Code Status   Code Status: Full Code  Home/SNF/Other Home Patient oriented to: self, place, time, and situation Is this baseline? Yes   Triage Complete: Triage complete  Chief Complaint Sepsis ([place]) [A41.9] Severe sepsis ([place]) [A41.9, R65.20]  Triage Note Per [id number] pt coming from UC for respiratory distress. Patient went to [facility] for shortness of breath and COPD exacerbation over past two weeks. Patient states symptoms started after receiving covid and flu vaccine. Patient smokes 0.5 pack a day. Patient given 3 duoneb, 125 solu medrol and 2 g magnesium en route.    Allergies Allergies  Allergen Reactions   Niaspan [Niacin Er] Itching and Other (See Comments)    insomnia   Statins     LFT elevavation, CK elevevation    Level of Care/Admitting Diagnosis ED Disposition     ED Disposition  Admit   Condition  --   Comment  Hospital Area: [place] [[id number]]  Level of Care: Stepdown [14]  Admit to SDU based on following criteria: Hemodynamic compromise or significant risk of instability:  Patient requiring short term acute titration and management of vasoactive drips, and invasive monitoring (i.e., CVP and Arterial line).  May admit patient to [facility] or [facility] if equivalent level of care is available:: Yes  Covid Evaluation: Confirmed COVID Negative  Diagnosis: [id number]  Admitting Physician: Lucillie Garfinkel [id number]  Attending Physician: Hollice Gong, MIR Kari.[place] [id number]  Certification:: I certify this patient will need inpatient services for at least 2 midnights          B Medical/Surgery History Past Medical History:  Diagnosis Date   Arthritis    hands    Chronic bronchitis ([place])    Chronic sinusitis of both maxillary sinuses 04/16/2018   Sinus CT 04/25/2018 Mild  mucoperiosteal thickening involving the frontoethmoidal sinuses. Otherwise largely clear sinuses with no evidence for acute sinusitis.    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 (HCC)    DOE (dyspnea on exertion) 04/16/2018   Essential hypertension, benign 09/23/2012   Changed acei to arb 04/16/2018 due to pseudowheeze > improved 06/08/2018    Hernia, inguinal 09/23/2012   Hyperlipidemia    has not tolerated statins,  would not pursue PSK9 inhibitor as advised by Dr Claiborne Billings   Hypertension    Myocardial infarction [facility])    NSTEMI (non-ST elevated myocardial infarction) ([place]) 09/23/2012   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   Tobacco abuse    Past Surgical History:  Procedure Laterality Date   CARDIAC CATHETERIZATION     CARDIOVASCULAR STRESS TEST  11/11/2011   CORONARY ANGIOPLASTY     stents- 01/2004    CORONARY ARTERY  BYPASS GRAFT N/A 10/15/2020   Procedure: CORONARY ARTERY BYPASS GRAFTING (CABG) TIMES FOUR USING BILATERAL INTERNAL MAMMARY ARTERIES AND ENDOSCOPICALLY HARVESTED GREATER SAPHENOUS VEIN AND INSERTION OF 50CC INTRAAORTIC BALLOON PUMP;  Surgeon: [name], MD;  Location: [place];  Service: Open Heart Surgery;  Laterality: N/A;  possible BIMA   ENDOVEIN HARVEST OF GREATER SAPHENOUS VEIN Right 10/15/2020   Procedure: ENDOVEIN HARVEST OF GREATER SAPHENOUS VEIN;  Surgeon: [name], MD;  Location: [place];  Service: Open Heart Surgery;  Laterality: Right;   heart stents  01/2004    INGUINAL HERNIA REPAIR Right 12/06/2012   Procedure: HERNIA REPAIR INGUINAL ADULT;  Surgeon: [name], DO;  Location: [facility];  Service: General;  Laterality: Right;   INSERTION OF MESH Right 12/06/2012   Procedure: INSERTION OF MESH;  Surgeon: [name], DO;  Location: [facility];  Service: General;  Laterality: Right;   LEFT HEART CATH AND CORONARY ANGIOGRAPHY N/A 07/22/2019   Procedure: LEFT HEART CATH AND CORONARY ANGIOGRAPHY;  Surgeon: [name], MD;  Location: [place] [facility];  Service: Cardiovascular;  Laterality: N/A;   LEFT HEART CATH AND CORONARY ANGIOGRAPHY N/A 10/14/2020   Procedure: LEFT HEART CATH AND CORONARY ANGIOGRAPHY;  Surgeon: [name], MD;  Location: [place] [facility];  Service: Cardiovascular;  Laterality: N/A;   TEE WITHOUT CARDIOVERSION N/A 10/15/2020   Procedure: TRANSESOPHAGEAL ECHOCARDIOGRAM (TEE);  Surgeon: [name], MD;  Location: [place];  Service: Open Heart Surgery;  Laterality: N/A;     A IV Location/Drains/Wounds Patient Lines/Drains/Airways Status     Active Line/Drains/Airways     Name Placement date Placement time Site Days   Peripheral IV 10/29/22 20 G 1" Posterior;Right Wrist 10/29/22  1201  Wrist  less than 1   Peripheral IV 10/29/22 20 G Right Antecubital 10/29/22  1200  Antecubital  less than 1   Incision 12/06/12 Abdomen Right 12/06/12  1425  -- 3614   Incision (Closed) 10/15/20 Chest Other (Comment) 10/15/20  1243  -- 744   Incision (Closed) 10/15/20 Leg Right 10/15/20  1243  -- 744            Intake/Output Last 24 hours  Intake/Output Summary (Last 24 hours) at 10/29/2022 2032 Last data filed at 10/29/2022 1901 Gross per 24 hour  Intake 1346.89 ml  Output 400 ml  Net 946.89 ml    Labs/Imaging Results for orders placed or performed during the hospital encounter of 10/29/22 (from the past 48 hour(s))  Resp panel by RT-PCR (RSV, Flu A&B, Covid) Anterior Nasal Swab     Status: None   Collection Time: 10/29/22  11:08 AM   Specimen: Anterior Nasal Swab  Result Value Ref Range   SARS Coronavirus 2 by RT PCR NEGATIVE NEGATIVE    Comment: (NOTE) SARS-CoV-2 target nucleic acids are NOT DETECTED.  The SARS-CoV-2 RNA is generally detectable in upper respiratory specimens during the acute phase of infection. The lowest concentration of SARS-CoV-2 viral copies this assay can detect is 138 copies/mL. A negative result does not preclude SARS-Cov-2 infection and should not be used as the sole basis for treatment or other patient management decisions. A negative result may occur with  improper specimen collection/handling, submission of specimen other than nasopharyngeal swab, presence of viral mutation(s) within the areas targeted by this assay, and inadequate number of viral copies(<138 copies/mL). A negative result must be combined with clinical observations, patient history, and epidemiological information. The expected result is Negative.  Fact Sheet  for Patients:  EntrepreneurPulse.com.au  Fact Sheet for Healthcare Providers:  IncredibleEmployment.be  This test is no t yet approved or cleared by the [place] FDA and  has been authorized for detection and/or diagnosis of SARS-CoV-2 by FDA under an Emergency Use Authorization (EUA). This EUA will remain  in effect (meaning this test can be used) for the duration of the COVID-19 declaration under Section 564(b)(1) of the Act, 21 U.S.C.section 360bbb-3(b)(1), unless the authorization is terminated  or revoked sooner.       Influenza A by PCR NEGATIVE NEGATIVE   Influenza B by PCR NEGATIVE NEGATIVE    Comment: (NOTE) The Xpert Xpress SARS-CoV-2/FLU/RSV plus assay is intended as an aid in the diagnosis of influenza from Nasopharyngeal swab specimens and should not be used as a sole basis for treatment. Nasal washings and aspirates are unacceptable for Xpert Xpress SARS-CoV-2/FLU/RSV testing.  Fact  Sheet for Patients: EntrepreneurPulse.com.au  Fact Sheet for Healthcare Providers: IncredibleEmployment.be  This test is not yet approved or cleared by the [place] FDA and has been authorized for detection and/or diagnosis of SARS-CoV-2 by FDA under an Emergency Use Authorization (EUA). This EUA will remain in effect (meaning this test can be used) for the duration of the COVID-19 declaration under Section 564(b)(1) of the Act, 21 U.S.C. section 360bbb-3(b)(1), unless the authorization is terminated or revoked.     Resp Syncytial Virus by PCR NEGATIVE NEGATIVE    Comment: (NOTE) Fact Sheet for Patients: EntrepreneurPulse.com.au  Fact Sheet for Healthcare Providers: IncredibleEmployment.be  This test is not yet approved or cleared by the [place] FDA and has been authorized for detection and/or diagnosis of SARS-CoV-2 by FDA under an Emergency Use Authorization (EUA). This EUA will remain in effect (meaning this test can be used) for the duration of the COVID-19 declaration under Section 564(b)(1) of the Act, 21 U.S.C. section 360bbb-3(b)(1), unless the authorization is terminated or revoked.  Performed at [facility], [place] [address], [place], [place] [address]   Urinalysis, Routine w reflex microscopic -Urine, Clean Catch     Status: Abnormal   Collection Time: 10/29/22 11:48 AM  Result Value Ref Range   Color, Urine AMBER (A) YELLOW    Comment: BIOCHEMICALS MAY BE AFFECTED BY COLOR   APPearance CLOUDY (A) CLEAR   Specific Gravity, Urine 1.020 1.005 - 1.030   pH 5.0 5.0 - 8.0   Glucose, UA NEGATIVE NEGATIVE mg/dL   Hgb urine dipstick SMALL (A) NEGATIVE   Bilirubin Urine NEGATIVE NEGATIVE   Ketones, ur 5 (A) NEGATIVE mg/dL   Protein, ur 100 (A) NEGATIVE mg/dL   Nitrite NEGATIVE NEGATIVE   Leukocytes,Ua NEGATIVE NEGATIVE   RBC / HPF 0-5 0 - 5 RBC/hpf   WBC, UA 0-5 0 - 5  WBC/hpf   Bacteria, UA RARE (A) NONE SEEN   Squamous Epithelial / HPF 0-5 0 - 5 /HPF   Mucus PRESENT    Hyaline Casts, UA PRESENT     Comment: Performed at [facility], [place] [address] [place], [place] [address]  Blood Culture (routine x 2)     Status: None (Preliminary result)   Collection Time: 10/29/22 12:01 PM   Specimen: BLOOD RIGHT HAND  Result Value Ref Range   Specimen Description      BLOOD RIGHT HAND Performed at [facility], [place] [address] [place], [place] [address]    Special Requests      BOTTLES DRAWN AEROBIC AND ANAEROBIC Blood Culture results  may not be optimal due to an inadequate volume of blood received in culture bottles Performed at [place] [address], [place], [place] [address]    Culture PENDING    Report Status PENDING   Lactic acid, plasma     Status: Abnormal   Collection Time: 10/29/22 12:13 PM  Result Value Ref Range   Lactic Acid, Venous 3.5 (HH) 0.5 - 1.9 mmol/L    Comment: CRITICAL RESULT CALLED TO, READ BACK BY AND VERIFIED WITH DOWD, P @ 1352 ON 10/29/2022 BY Gloriann Loan Performed at [facility], [place] [address] [place], [place] [address]   Comprehensive metabolic panel     Status: Abnormal   Collection Time: 10/29/22 12:13 PM  Result Value Ref Range   Sodium 131 (L) 135 - 145 mmol/L   Potassium 3.6 3.5 - 5.1 mmol/L   Chloride 91 (L) 98 - 111 mmol/L   CO2 25 22 - 32 mmol/L   Glucose, Bld 115 (H) 70 - 99 mg/dL    Comment: Glucose reference range applies only to samples taken after fasting for at least 8 hours.   BUN 25 (H) 8 - 23 mg/dL   Creatinine, Ser 1.22 0.61 - 1.24 mg/dL   Calcium 8.1 (L) 8.9 - 10.3 mg/dL   Total Protein 7.1 6.5 - 8.1 g/dL   Albumin 2.9 (L) 3.5 - 5.0 g/dL   AST 37 15 - 41 U/L   ALT 14 0 - 44 U/L   Alkaline Phosphatase 64 38 - 126 U/L   Total Bilirubin 1.3 (H) 0.3 - 1.2 mg/dL   GFR, Estimated >60 >60 mL/min    Comment: (NOTE) Calculated using  the CKD-EPI Creatinine Equation (2021)    Anion gap 15 5 - 15    Comment: Performed at [facility], [place] [address], [place], [place] [address]  CBC with Differential     Status: Abnormal   Collection Time: 10/29/22 12:13 PM  Result Value Ref Range   WBC 28.0 (H) 4.0 - 10.5 K/uL   RBC 4.64 4.22 - 5.81 MIL/uL   Hemoglobin 15.1 13.0 - 17.0 g/dL   HCT 46.4 39.0 - 52.0 %   MCV 100.0 80.0 - 100.0 fL   MCH 32.5 26.0 - 34.0 pg   MCHC 32.5 30.0 - 36.0 g/dL   RDW 14.0 11.5 - 15.5 %   Platelets 189 150 - 400 K/uL   nRBC 0.0 0.0 - 0.2 %   Neutrophils Relative % 93 %   Neutro Abs 26.1 (H) 1.7 - 7.7 K/uL   Lymphocytes Relative 1 %   Lymphs Abs 0.4 (L) 0.7 - 4.0 K/uL   Monocytes Relative 3 %   Monocytes Absolute 0.8 0.1 - 1.0 K/uL   Eosinophils Relative 0 %   Eosinophils Absolute 0.0 0.0 - 0.5 K/uL   Basophils Relative 1 %   Basophils Absolute 0.2 (H) 0.0 - 0.1 K/uL   Immature Granulocytes 2 %   Abs Immature Granulocytes 0.52 (H) 0.00 - 0.07 K/uL   Dohle Bodies PRESENT    Polychromasia PRESENT     Comment: Performed at [facility], [place] [address], [place], [place] [address]  Protime-INR     Status: Abnormal   Collection Time: 10/29/22 12:13 PM  Result Value Ref Range   Prothrombin Time 17.1 (H) 11.4 - 15.2 seconds   INR 1.4 (H) 0.8 - 1.2    Comment: (NOTE) INR goal varies based on device and disease states.  Performed at [facility], [place] [address] [place], [place] [address]   APTT     Status: None   Collection Time: 10/29/22 12:13 PM  Result Value Ref Range   aPTT 30 24 - 36 seconds    Comment: Performed at [facility], [place] [address] [place], [place] [address]  Hemoglobin A1c     Status: None   Collection Time: 10/29/22 12:13 PM  Result Value Ref Range   Hgb A1c MFr Bld 5.1 4.8 - 5.6 %    Comment: (NOTE) Pre diabetes:          5.7%-6.4%  Diabetes:              >6.4%  Glycemic control for    <7.0% adults with diabetes    Mean Plasma Glucose 99.67 mg/dL    Comment: Performed at [place] [address], [place], [place] [address]  Lactic acid, plasma     Status: Abnormal   Collection Time: 10/29/22  1:48 PM  Result Value Ref Range   Lactic Acid, Venous 6.3 (HH) 0.5 - 1.9 mmol/L    Comment: CRITICAL RESULT CALLED TO, READ BACK BY AND VERIFIED WITH BOWEN, M @1538$  ON 10/29/2022 BY Gloriann Loan Performed at [facility], [place] [address], [place], [place] [address]   CBG monitoring, ED     Status: Abnormal   Collection Time: 10/29/22  4:07 PM  Result Value Ref Range   Glucose-Capillary 155 (H) 70 - 99 mg/dL    Comment: Glucose reference range applies only to samples taken after fasting for at least 8 hours.  Lactic acid, plasma     Status: Abnormal   Collection Time: 10/29/22  5:16 PM  Result Value Ref Range   Lactic Acid, Venous 4.4 (HH) 0.5 - 1.9 mmol/L    Comment: CRITICAL VALUE NOTED. VALUE IS CONSISTENT WITH PREVIOUSLY REPORTED/CALLED VALUE Performed at [place] [address], [place], [place] [address]    DG Chest 2 View  Result Date: 10/29/2022 CLINICAL DATA:  Provided history: Shortness of breath. Productive cough. EXAM: CHEST - 2 VIEW COMPARISON:  Chest radiograph 06/01/2022 and earlier. FINDINGS: Prior median sternotomy. Heart size within normal is. Extensive airspace opacity within the right upper lobe compatible with pneumonia. To a lesser extent, airspace opacities are also present within the right middle lobe, also consistent with pneumonia. Small right pleural effusion. No appreciable airspace consolidation on the left. No evidence of pneumothorax. No acute bony abnormality identified. IMPRESSION: Findings consistent with right upper and middle lobe pneumonia. Followup PA and lateral chest radiographs are recommended in 3-4 weeks following a trial of antibiotic therapy to ensure resolution and exclude underlying  malignancy. Associated small right pleural effusion. Electronically Signed   By: Kellie Simmering D.O.   On: 10/29/2022 11:44    Pending Labs Unresulted Labs (From admission, onward)     Start     Ordered   11/05/22 0500  Creatinine, serum  (enoxaparin (LOVENOX)    CrCl >/= 30 ml/min)  Weekly,   R     Comments: while on enoxaparin therapy    10/29/22 1454   10/30/22 [id number]  Basic metabolic panel  Tomorrow morning,   R        10/29/22 1454   10/30/22 0500  CBC  Tomorrow morning,   R        10/29/22 1454   10/29/22 1730  Lactic acid, plasma  STAT Now then every  3 hours,   R (with STAT occurrences)      10/29/22 1606   10/29/22 1148  Blood Culture (routine x 2)  (Septic presentation on arrival (screening labs, nursing and treatment orders for obvious sepsis))  BLOOD CULTURE X 2,   STAT      10/29/22 1149            Vitals/Pain Today's Vitals   10/29/22 1800 10/29/22 1841 10/29/22 1925 10/29/22 2006  BP: (!) 152/65 134/62  130/73  Pulse: 67 (!) 110  (!) 105  Resp: (!) 33 (!) 22  (!) 30  Temp:  98.9 F (37.2 C)  98.9 F (37.2 C)  TempSrc:  Oral  Oral  SpO2: 90% 91% 94% 95%  Weight:      Height:      PainSc:    0-No pain    Isolation Precautions No active isolations  Medications Medications  lactated ringers infusion ( Intravenous New Bag/Given 10/29/22 1221)  cefTRIAXone (ROCEPHIN) 2 g in sodium chloride 0.9 % 100 mL IVPB (0 g Intravenous Stopped 10/29/22 1241)  azithromycin (ZITHROMAX) 500 mg in sodium chloride 0.9 % 250 mL IVPB (0 mg Intravenous Stopped 10/29/22 1312)  aspirin chewable tablet 81 mg (81 mg Oral Given 10/29/22 1557)  metoprolol succinate (TOPROL-XL) 24 hr tablet 37.5 mg (37.5 mg Oral Given 10/29/22 1557)  clopidogrel (PLAVIX) tablet 75 mg (0 mg Oral Hold 10/29/22 1559)  insulin aspart (novoLOG) injection 0-15 Units (3 Units Subcutaneous Given 10/29/22 1616)  insulin aspart (novoLOG) injection 0-5 Units (has no administration in time range)  enoxaparin (LOVENOX)  injection 40 mg (has no administration in time range)  acetaminophen (TYLENOL) tablet 650 mg (has no administration in time range)    Or  acetaminophen (TYLENOL) suppository 650 mg (has no administration in time range)  oxyCODONE (Oxy IR/ROXICODONE) immediate release tablet 5 mg (has no administration in time range)  docusate sodium (COLACE) capsule 100 mg (100 mg Oral Given 10/29/22 1557)  traZODone (DESYREL) tablet 25 mg (has no administration in time range)  polyethylene glycol (MIRALAX / GLYCOLAX) packet 17 g (has no administration in time range)  ondansetron (ZOFRAN) tablet 4 mg (has no administration in time range)    Or  ondansetron (ZOFRAN) injection 4 mg (has no administration in time range)  albuterol (PROVENTIL) (2.5 MG/3ML) 0.083% nebulizer solution 2.5 mg (has no administration in time range)  ipratropium-albuterol (DUONEB) 0.5-2.5 (3) MG/3ML nebulizer solution 3 mL (3 mLs Nebulization Given 10/29/22 1925)  methylPREDNISolone sodium succinate (SOLU-MEDROL) 40 mg/mL injection 40 mg (40 mg Intravenous Given 10/29/22 1557)  sodium chloride 0.9 % bolus 1,000 mL (0 mLs Intravenous Stopped 10/29/22 1348)  albuterol (PROVENTIL) (2.5 MG/3ML) 0.083% nebulizer solution 5 mg (5 mg Nebulization Given 10/29/22 1228)  ipratropium (ATROVENT) nebulizer solution 0.5 mg (0.5 mg Nebulization Given 10/29/22 1228)  sodium chloride 0.9 % bolus 1,000 mL (0 mLs Intravenous Stopped 10/29/22 1616)  albuterol (PROVENTIL) (2.5 MG/3ML) 0.083% nebulizer solution 5 mg (5 mg Nebulization Given 10/29/22 1448)  sodium chloride 0.9 % bolus 1,000 mL (0 mLs Intravenous Stopped 10/29/22 1901)  diphenhydrAMINE (BENADRYL) injection 12.5 mg (12.5 mg Intravenous Given 10/29/22 2002)    Mobility walks     Focused Assessments See chart   R Recommendations: See Admitting Provider Note  Report given to:   Additional Notes: N/a

## 2022-10-29 NOTE — ED Provider Notes (Signed)
[facility]    CSN: [id number] Arrival date & time: 10/29/22  0949      History   Chief Complaint Chief Complaint  Patient presents with   Shortness of Breath    HPI Jonathan [name] is a 72 y.o. male.   Patient today for evaluation of shortness of breath and worsening COPD symptoms that started 2 weeks ago.  He states that symptoms worsened after COVID and flu vaccinations.  He has not had any fever.  He does not typically need supplemental oxygen at home. He has been taking symbicort as prescribed.  The history is provided by the patient.  Shortness of Breath Associated symptoms: chest pain, cough and wheezing   Associated symptoms: no fever and no vomiting     Past Medical History:  Diagnosis Date   Arthritis    hands    Chronic bronchitis (HCC)    Chronic sinusitis of both maxillary sinuses 04/16/2018   Sinus CT 04/25/2018 Mild mucoperiosteal thickening involving the frontoethmoidal sinuses. Otherwise largely clear sinuses with no evidence for acute sinusitis.    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 (HCC)    DOE (dyspnea on exertion) 04/16/2018   Essential hypertension, benign 09/23/2012   Changed acei to arb 04/16/2018 due to pseudowheeze > improved 06/08/2018    Hernia, inguinal 09/23/2012   Hyperlipidemia    has not tolerated statins,  would not pursue PSK9 inhibitor as advised by Dr Claiborne Billings   Hypertension    Myocardial infarction [facility])    NSTEMI  (non-ST elevated myocardial infarction) ([place]) 09/23/2012   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   Tobacco abuse     Patient Active Problem List   Diagnosis Date Noted   Cerebellar stroke ([place]) 10/25/2020   Cerebral embolism with cerebral infarction 10/21/2020   History of open heart surgery    Tachypnea    ETOH abuse    Tobacco abuse    Essential hypertension    Chronic obstructive pulmonary disease (HCC)    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   Coronary artery disease involving native coronary artery of native heart with unstable angina pectoris ([place])    Cigarette smoker 04/17/2018   DOE (dyspnea on exertion) 04/16/2018   Chronic sinusitis of both maxillary sinuses 04/16/2018   COPD GOLD II   active smoker 04/16/2018   Obesity (BMI 30.0-34.9) 12/16/2015   Tobacco abuse counseling 08/05/2014   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   Essential hypertension, benign 09/23/2012   NSTEMI (non-ST elevated myocardial infarction) ([place]) 09/23/2012   Hyperlipidemia 09/23/2012   Hernia, inguinal 09/23/2012    Past Surgical History:  Procedure Laterality Date   CARDIAC CATHETERIZATION     CARDIOVASCULAR STRESS TEST  11/11/2011   CORONARY ANGIOPLASTY     stents- 01/2004    CORONARY ARTERY BYPASS GRAFT N/A 10/15/2020   Procedure:  CORONARY ARTERY BYPASS GRAFTING (CABG) TIMES FOUR USING BILATERAL INTERNAL MAMMARY ARTERIES AND ENDOSCOPICALLY HARVESTED GREATER SAPHENOUS VEIN AND INSERTION OF 50CC INTRAAORTIC BALLOON PUMP;  Surgeon: Jonathan [name];  Location: [place];  Service: Open Heart Surgery;  Laterality: N/A;  possible BIMA   ENDOVEIN HARVEST OF GREATER SAPHENOUS VEIN Right 10/15/2020   Procedure: ENDOVEIN HARVEST OF GREATER SAPHENOUS VEIN;  Surgeon: Jonathan [name];  Location: [place];  Service: Open Heart Surgery;  Laterality: Right;   heart stents  01/2004   INGUINAL HERNIA REPAIR Right 12/06/2012   Procedure: HERNIA REPAIR INGUINAL ADULT;  Surgeon: Jonathan [name];  [facility];  Service:  General;  Laterality: Right;   INSERTION OF MESH Right 12/06/2012   Procedure: INSERTION OF MESH;  Surgeon: Jonathan [name];  [facility];  Service: General;  Laterality: Right;   LEFT HEART CATH AND CORONARY ANGIOGRAPHY N/A 07/22/2019   Procedure: LEFT HEART CATH AND CORONARY ANGIOGRAPHY;  Surgeon: Jonathan [name];  Location: [place] [facility];  Service: Cardiovascular;  Laterality: N/A;   LEFT HEART CATH AND CORONARY ANGIOGRAPHY N/A 10/14/2020   Procedure: LEFT HEART CATH AND CORONARY ANGIOGRAPHY;  Surgeon: Jonathan [name];  Location: [place] [facility];  Service: Cardiovascular;  Laterality: N/A;   TEE WITHOUT CARDIOVERSION N/A 10/15/2020   Procedure: TRANSESOPHAGEAL ECHOCARDIOGRAM (TEE);  Surgeon: Jonathan [name];  Location: [place];  Service: Open Heart Surgery;  Laterality: N/A;       Home Medications    Prior to Admission medications   Medication Sig Start Date End Date Taking? Authorizing Provider  acetaminophen (TYLENOL) 325 MG tablet Take 1-2 tablets (325-650 mg total) by mouth every 4 (four) hours as needed for mild pain. Patient not taking: Reported on 09/23/2021 11/04/20   Love, Jonathan [name]  albuterol (VENTOLIN HFA) 108 (90 Base) MCG/ACT inhaler Inhale 1-2 puffs into the lungs every 6 (six) hours as needed for wheezing or shortness of breath. Patient not taking: Reported on 09/23/2021 03/11/21   Jonathan [name]  aspirin 81 MG chewable tablet Chew 1 tablet (81 mg total) by mouth daily. 11/04/20   Love, Jonathan [name]  clopidogrel (PLAVIX) 75 MG tablet TAKE 1 TABLET BY MOUTH  DAILY 01/17/22   Jonathan [name]  guaiFENesin 200 MG tablet Take 1 tablet (200 mg total) by mouth every 6 (six) hours as needed for cough or to loosen phlegm. Patient not taking: Reported on 09/23/2021 11/04/20   Jonathan [name]  metoprolol succinate (TOPROL-XL) 25 MG 24 hr tablet TAKE 1 AND 1/2 TABLETS BY  MOUTH DAILY 01/17/22   Jonathan [name]  nicotine polacrilex (NICORETTE) 2 MG  gum Take 2 mg by mouth as needed for smoking cessation. Patient not taking: Reported on 09/23/2021    Provider, Historical, [name]  nitroGLYCERIN (NITROSTAT) 0.4 MG SL tablet PLACE 1 TABLET UNDER THE TONGUE EVERY 5 MINUTES AS NEEDED FOR CHEST PAIN UP TO 3 DOSES, IF SYMPTOMS PERSIST CALL 911 09/07/20   Jonathan [name]  REPATHA SURECLICK [id number] MG/ML SOAJ INJECT 140 MG INTO THE SKIN EVERY 14 DAYS 12/07/21   Jonathan [name]  sodium chloride (OCEAN) 0.65 % SOLN nasal spray Place 1 spray into both nostrils 2 (two) times daily at 8 am and 10 pm. Patient not taking: Reported on 09/23/2021 11/04/20   Jonathan [name]  SYMBICORT 160-4.5 MCG/ACT inhaler INHALE 2 PUFFS INTO THE LUNGS TWICE DAILY Patient  not taking: Reported on 09/23/2021 09/10/21   Martyn Ehrich, NP    Family History Family History  Problem Relation Age of Onset   CAD Father        CABG with subsequent stroke    Social History Social History   Tobacco Use   Smoking status: Former    Packs/day: 1.00    Years: 53.00    Total pack years: 53.00    Types: Cigarettes    Quit date: 10/14/2020    Years since quitting: 2.0   Smokeless tobacco: Never  Vaping Use   Vaping Use: Never used  Substance Use Topics   Alcohol use: Yes    Alcohol/week: 5.0 standard drinks of alcohol    Types: 5 Standard drinks or equivalent per week    Comment: consumes 1 pint per day    Drug use: No     Allergies   Niaspan [niacin er] and Statins   Review of Systems Review of Systems  Constitutional:  Negative for chills and fever.  HENT:  Positive for congestion.   Eyes:  Negative for discharge and redness.  Respiratory:  Positive for cough, shortness of breath and wheezing.   Cardiovascular:  Positive for chest pain.  Gastrointestinal:  Negative for diarrhea and vomiting.     Physical Exam Triage Vital Signs ED Triage Vitals [10/29/22 1003]  Enc Vitals Group     BP 113/67     Pulse Rate (!) 110     Resp (!) 28     Temp 97.7 F  (36.5 C)     Temp Source Oral     SpO2 (!) 84 %     Weight      Height      Head Circumference      Peak Flow      Pain Score 5     Pain Loc      Pain Edu?      Excl. in [place]?    No data found.  Updated Vital Signs BP 113/67 (BP Location: Left Arm)   Pulse (!) 110   Temp 97.7 F (36.5 C) (Oral)   Resp (!) 28   SpO2 (!) 84%      Physical Exam Vitals and nursing note reviewed.  Constitutional:      General: He is in acute distress (appears short of breath).     Appearance: He is not ill-appearing.  HENT:     Head: Normocephalic and atraumatic.     Nose: Congestion present.  Eyes:     General: Scleral icterus present.  Cardiovascular:     Rate and Rhythm: Tachycardia present.  Pulmonary:     Effort: Respiratory distress present.     Breath sounds: Wheezing, rhonchi and rales present.     Comments: Breath sounds significantly diminished throughout Neurological:     Mental Status: He is alert.      UC Treatments / Results  Labs (all labs ordered are listed, but only abnormal results are displayed) Labs Reviewed - No data to display  EKG   Radiology No results found.  Procedures Procedures (including critical care time)  Medications Ordered in UC Medications  ipratropium-albuterol (DUONEB) 0.5-2.5 (3) MG/3ML nebulizer solution 3 mL (3 mLs Nebulization Given 10/29/22 1005)    Initial Impression / Assessment and Plan / UC Course  I have reviewed the triage vital signs and the nursing notes.  Pertinent labs & imaging results that were available during my care of the patient were reviewed  by me and considered in my medical decision making (see chart for details).    EMS called for transport given significant hypoxia even after duoneb treatment. Patient oxygen sat remained in 80s even with 4 L of O2 via [place]. EMS arrived- care transferred to [facility].   Final Clinical Impressions(s) / UC Diagnoses   Final diagnoses:  COPD exacerbation ([place])  Acute on chronic  respiratory failure with hypoxia [facility])   Discharge Instructions   None    ED Prescriptions   None    PDMP not reviewed this encounter.   [name], [name] 10/29/22 1039

## 2022-10-29 NOTE — ED Triage Notes (Signed)
Pt here for increased SOB and COPD sx x 2 weeks worse ever last few days; pt noted to be labored at present

## 2022-10-29 NOTE — Sepsis Progress Note (Addendum)
Elink following code sepsis  85- MD messaged asking to consider ordering a 3rd lactic acid, admitting MD messaged back they are planning to order more fluids and another lactic

## 2022-10-29 NOTE — Progress Notes (Signed)
Patient transferred to ICU from ED. Patient tolerated well.

## 2022-10-29 NOTE — ED Notes (Signed)
Pt on Bipap now

## 2022-10-29 NOTE — H&P (Signed)
History and Physical  Jonathan [name] [id number] DOB: November 16, 1950 DOA: 10/29/2022   PCP: Leonard Downing, MD   Patient coming from: Home  Chief Complaint: SOB   HPI: Jonathan [name] is a 72 y.o. male with medical history significant for COPD on room air at baseline, hypertension, hyperlipidemia, CAD status post CABG being admitted to the hospital with severe sepsis due to community-acquired pneumonia complicated by acute exacerbation of COPD.  States that he has been having worsening cough, weakness, subjective fevers and chills for the last 2 to 3 weeks.  For the last week in particular, he has been feeling more dyspneic with exertion.  He wears no oxygen at baseline, went to urgent care today for these complaints.  His oxygen saturation at urgent care today was 84%.  He was placed on oxygen, transferred to the emergency department by EMS, he was given 3 DuoNebs, 125 mg Solu-Medrol and 2 g of magnesium en route.  ED Course: Evaluation here in the emergency department, he was noted to be tachycardic heart rate 117, tachypneic 31, blood pressure 110/72, saturating 100% on 3 L nasal cannula oxygen.  Lab work revealed white blood cell count of 28,000, sodium 131, chloride 91, BUN 25/creatinine 1.22 INR 1.4.  Initial lactate was 3.5, repeat after 2 hours was 6.3.  Review of Systems: Please see HPI for pertinent positives and negatives. A complete 10 system review of systems are otherwise negative.  Past Medical History:  Diagnosis Date   Arthritis    hands    Chronic bronchitis (HCC)    Chronic sinusitis of both maxillary sinuses 04/16/2018   Sinus CT 04/25/2018 Mild mucoperiosteal thickening involving the frontoethmoidal sinuses. Otherwise largely clear sinuses with no evidence for acute sinusitis.    COPD GOLD II   active smoker 04/16/2018   Spirometry 04/16/2018  FEV1 1.43 (47%)  Ratio 62  s prior rx  - 04/16/2018    try symbicort 160 2bid - PFT's  06/08/2018  FEV1 2.03 (71 % ) ratio 61  p 5 %  improvement from saba p nothing prior to study with DLCO  63 % corrects to 72  % for alv volume    -  06/08/2018  After extensive coaching inhaler device,  effectiveness =    75%  (short ti/ variable flow) > continue symbicort 160 2bid    Coronary artery disease    Coronary artery disease involving native coronary artery of native heart with unstable angina pectoris (HCC)    DOE (dyspnea on exertion) 04/16/2018   Essential hypertension, benign 09/23/2012   Changed acei to arb 04/16/2018 due to pseudowheeze > improved 06/08/2018    Hernia, inguinal 09/23/2012   Hyperlipidemia    has not tolerated statins,  would not pursue PSK9 inhibitor as advised by Dr Claiborne Billings   Hypertension    Myocardial infarction [facility])    NSTEMI (non-ST elevated myocardial infarction) ([place]) 09/23/2012   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   Tobacco abuse    Past Surgical History:  Procedure Laterality Date   CARDIAC CATHETERIZATION     CARDIOVASCULAR STRESS TEST  11/11/2011   CORONARY ANGIOPLASTY     stents- 01/2004    CORONARY ARTERY BYPASS GRAFT N/A 10/15/2020   Procedure: CORONARY ARTERY BYPASS GRAFTING (CABG) TIMES FOUR USING BILATERAL INTERNAL MAMMARY ARTERIES AND ENDOSCOPICALLY HARVESTED GREATER SAPHENOUS VEIN AND INSERTION OF 50CC INTRAAORTIC BALLOON PUMP;  Surgeon: [name], MD;  Location: [place];  Service: Open Heart Surgery;  Laterality: N/A;  possible BIMA  ENDOVEIN HARVEST OF GREATER SAPHENOUS VEIN Right 10/15/2020   Procedure: ENDOVEIN HARVEST OF GREATER SAPHENOUS VEIN;  Surgeon: [name], MD;  Location: [place];  Service: Open Heart Surgery;  Laterality: Right;   heart stents  01/2004   INGUINAL HERNIA REPAIR Right 12/06/2012   Procedure: HERNIA REPAIR INGUINAL ADULT;  Surgeon: [name], DO;  [facility];  Service: General;  Laterality: Right;   INSERTION OF MESH Right 12/06/2012   Procedure: INSERTION OF MESH;  Surgeon: [name], DO;  [facility];  Service: General;  Laterality: Right;    LEFT HEART CATH AND CORONARY ANGIOGRAPHY N/A 07/22/2019   Procedure: LEFT HEART CATH AND CORONARY ANGIOGRAPHY;  Surgeon: [name], MD;  Location: [place] [facility];  Service: Cardiovascular;  Laterality: N/A;   LEFT HEART CATH AND CORONARY ANGIOGRAPHY N/A 10/14/2020   Procedure: LEFT HEART CATH AND CORONARY ANGIOGRAPHY;  Surgeon: [name], MD;  Location: [place] [facility];  Service: Cardiovascular;  Laterality: N/A;   TEE WITHOUT CARDIOVERSION N/A 10/15/2020   Procedure: TRANSESOPHAGEAL ECHOCARDIOGRAM (TEE);  Surgeon: [name], MD;  Location: [place];  Service: Open Heart Surgery;  Laterality: N/A;    Social History:  reports that he quit smoking about 2 years ago. His smoking use included cigarettes. He has a 53.00 pack-year smoking history. He has never used smokeless tobacco. He reports current alcohol use of about 5.0 standard drinks of alcohol per week. He reports that he does not use drugs.   Allergies  Allergen Reactions   Niaspan [Niacin Er] Itching and Other (See Comments)    insomnia   Statins     LFT elevavation, CK elevevation    Family History  Problem Relation Age of Onset   CAD Father        CABG with subsequent stroke     Prior to Admission medications   Medication Sig Start Date End Date Taking? Authorizing Provider  aspirin 81 MG chewable tablet Chew 1 tablet (81 mg total) by mouth daily. 11/04/20  Yes Love, Ivan Anchors, PA-C  clopidogrel (PLAVIX) 75 MG tablet TAKE 1 TABLET BY MOUTH  DAILY Patient taking differently: Take 75 mg by mouth daily. 01/17/22  Yes [name], MD  metoprolol succinate (TOPROL-XL) 25 MG 24 hr tablet TAKE 1 AND 1/2 TABLETS BY  MOUTH DAILY Patient taking differently: Take 37.5 mg by mouth daily. 01/17/22  Yes [name], MD  REPATHA SURECLICK [id number] MG/ML SOAJ INJECT 140 MG INTO THE SKIN EVERY 14 DAYS 12/07/21  Yes [name], MD  sodium chloride (OCEAN) 0.65 % SOLN nasal spray Place 1 spray into both nostrils 2 (two)  times daily at 8 am and 10 pm. 11/04/20  Yes Love, Ivan Anchors, PA-C  SYMBICORT 160-4.5 MCG/ACT inhaler INHALE 2 PUFFS INTO THE LUNGS TWICE DAILY 09/10/21  Yes Martyn Ehrich, NP  acetaminophen (TYLENOL) 325 MG tablet Take 1-2 tablets (325-650 mg total) by mouth every 4 (four) hours as needed for mild pain. Patient not taking: Reported on 09/23/2021 11/04/20   Love, Ivan Anchors, PA-C  albuterol (VENTOLIN HFA) 108 (90 Base) MCG/ACT inhaler Inhale 1-2 puffs into the lungs every 6 (six) hours as needed for wheezing or shortness of breath. Patient not taking: Reported on 09/23/2021 03/11/21   [name], MD  guaiFENesin 200 MG tablet Take 1 tablet (200 mg total) by mouth every 6 (six) hours as needed for cough or to loosen phlegm. Patient not taking: Reported  on 09/23/2021 11/04/20   Bary Leriche, PA-C  nitroGLYCERIN (NITROSTAT) 0.4 MG SL tablet PLACE 1 TABLET UNDER THE TONGUE EVERY 5 MINUTES AS NEEDED FOR CHEST PAIN UP TO 3 DOSES, IF SYMPTOMS PERSIST CALL 911 Patient not taking: Reported on 10/29/2022 09/07/20   [name], MD    Physical Exam: BP 130/74   Pulse (!) 109   Temp 97.9 F (36.6 C) (Axillary)   Resp (!) 29   Ht 5' 8"$  (1.727 m)   Wt 90.7 kg   SpO2 93%   BMI 30.41 kg/m   General:  Alert, oriented, calm, in no acute distress somewhat disheveled in appearance, but resting comfortably in the ER on nasal cannula oxygen.  Intermittent wet sounding cough, able to speak in full sentences without respiratory distress. Eyes: EOMI, clear conjuctivae, white sclerea Neck: supple, no masses, trachea mildline  Cardiovascular: Tachycardic and regular, no murmurs or rubs, no peripheral edema  Respiratory: Good bilateral air movement, with diffuse rhonchi and some end expiratory wheezing.  No tachypnea, retractions or other evidence of respiratory distress  Abdomen: soft, nontender, nondistended, normal bowel tones heard  Skin: dry, no rashes  Musculoskeletal: no joint effusions, normal range  of motion  Psychiatric: appropriate affect, normal speech  Neurologic: extraocular muscles intact, clear speech, moving all extremities with intact sensorium          Labs on Admission:  Basic Metabolic Panel: Recent Labs  Lab 10/29/22 1213  NA 131*  K 3.6  CL 91*  CO2 25  GLUCOSE 115*  BUN 25*  CREATININE 1.22  CALCIUM 8.1*   Liver Function Tests: Recent Labs  Lab 10/29/22 1213  AST 37  ALT 14  ALKPHOS 64  BILITOT 1.3*  PROT 7.1  ALBUMIN 2.9*   No results for input(s): "LIPASE", "AMYLASE" in the last 168 hours. No results for input(s): "AMMONIA" in the last 168 hours. CBC: Recent Labs  Lab 10/29/22 1213  WBC 28.0*  NEUTROABS 26.1*  HGB 15.1  HCT 46.4  MCV 100.0  PLT 189   Cardiac Enzymes: No results for input(s): "CKTOTAL", "CKMB", "CKMBINDEX", "TROPONINI" in the last 168 hours.  BNP (last 3 results) Recent Labs    06/01/22 1313  BNP 267.3*    ProBNP (last 3 results) No results for input(s): "PROBNP" in the last 8760 hours.  CBG: No results for input(s): "GLUCAP" in the last 168 hours.  Radiological Exams on Admission: DG Chest 2 View  Result Date: 10/29/2022 CLINICAL DATA:  Provided history: Shortness of breath. Productive cough. EXAM: CHEST - 2 VIEW COMPARISON:  Chest radiograph 06/01/2022 and earlier. FINDINGS: Prior median sternotomy. Heart size within normal is. Extensive airspace opacity within the right upper lobe compatible with pneumonia. To a lesser extent, airspace opacities are also present within the right middle lobe, also consistent with pneumonia. Small right pleural effusion. No appreciable airspace consolidation on the left. No evidence of pneumothorax. No acute bony abnormality identified. IMPRESSION: Findings consistent with right upper and middle lobe pneumonia. Followup PA and lateral chest radiographs are recommended in 3-4 weeks following a trial of antibiotic therapy to ensure resolution and exclude underlying malignancy.  Associated small right pleural effusion. Electronically Signed   By: Kellie Simmering D.O.   On: 10/29/2022 11:44    Assessment/Plan Principal Problem:   Severe sepsis ([place]) -meeting criteria with leukocytosis, tachypnea, tachycardia, lactate 6.3, source is multifocal community-acquired pneumonia.  Patient is hemodynamically stable, on nasal cannula oxygen and fluid responsive. -Inpatient admission to progressive unit -  Continue empiric IV azithromycin and Rocephin for 5-day course -Scheduled DuoNebs, as needed albuterol as needed wheezing or shortness of breath -IV Solu-Medrol, will plan to taper as the patient improves -Given rising lactate, patient will be given a third IV fluid bolus, and then continue maintenance IV fluids.  Recheck lactate after third bolus.   Essential hypertension, benign   Hyperlipidemia DM2 -pending diet when eating, with weight-based sliding scale insulin   Obesity (BMI 30.0-34.9) -getting all aspects of care   S/P CABG x 4   COPD with acute exacerbation (HCC) -treating as above   Leukocytosis   CAP (community acquired pneumonia)   Lactic acid acidosis   Hyponatremia -mild and asymptomatic, likely due to acute pulmonary infection and possibly exacerbated by dehydration.  Patient is being hydrated aggressively as above, sodium levels will be followed.  DVT prophylaxis: Lovenox  Code Status: FULL  Family Communication: No family present, patient is alert and oriented x 4.  Disposition Plan: Home at [place] called: None  Admission status: Inpatient to Progressive  Time spent: 62 minutes  Abimelec Grochowski Neva Seat MD [facility] Pager [phone]  If 7PM-7AM, please contact night-coverage [email or web address] Password [facility]  10/29/2022, 2:55 PM

## 2022-10-29 NOTE — ED Notes (Signed)
Patient is being discharged from the Urgent Care and sent to the Emergency Department via [facility] . Per Provider Ewell Poe, patient is in need of higher level of care due to respiratory distress. Patient is aware and verbalizes understanding of plan of care.    Vitals:   10/29/22 1003  BP: 113/67  Pulse: (!) 110  Resp: (!) 28  Temp: 97.7 F (36.5 C)  SpO2: (!) 84%

## 2022-10-29 NOTE — ED Notes (Signed)
Pt's Bi-Pap was alarming and when this RN entered room. Pt had removed Bi-Pap mask. He stated that he went to sleep and when he woke up,  he panicked and pulled mask off. Pt educated on need to leave mask in place. Resp at bedside now assisting with replacement

## 2022-10-29 NOTE — ED Notes (Signed)
Pt made aware of need for urine sample. Urinal provided at bedside.

## 2022-10-29 NOTE — ED Provider Notes (Cosign Needed Addendum)
[place] [facility]GENC[facility] DEPARTMENT AT [facility] Provider Note   CSN: [id number] Arrival date & time: 10/29/22  1053     History  Chief Complaint  Patient presents with   Respiratory Distress    Jonathan [name] is a 72 y.o. male.  Patient with history of COPD on no home oxygen, history of stroke history on clopidogrel, history of CABG and coronary stenting --presents to the emergency department today for worsening cough and shortness of breath.  Patient received flu and COVID vaccines about 2 weeks ago.  Shortly afterwards he developed worsening cough and shortness of breath.  He reports "sweating" with subjective fevers at home.  No shivering chills.  No vomiting.  He has had episodes of diarrhea.  No urinary symptoms including dysuria or hematuria.  No lower extremity swelling.  Patient presented to urgent care prior to arrival today.  There his oxygen saturation was 84%.  He was placed on oxygen and transported to the emergency department by EMS. Patient given 3 Duoneb, 155m solumedrol and 2 g magnesium en route.        Home Medications Prior to Admission medications   Medication Sig Start Date End Date Taking? Authorizing Provider  acetaminophen (TYLENOL) 325 MG tablet Take 1-2 tablets (325-650 mg total) by mouth every 4 (four) hours as needed for mild pain. Patient not taking: Reported on 09/23/2021 11/04/20   Love, PIvan Anchors PA-C  albuterol (VENTOLIN HFA) 108 (90 Base) MCG/ACT inhaler Inhale 1-2 puffs into the lungs every 6 (six) hours as needed for wheezing or shortness of breath. Patient not taking: Reported on 09/23/2021 03/11/21   KTroy Sine MD  aspirin 81 MG chewable tablet Chew 1 tablet (81 mg total) by mouth daily. 11/04/20   Love, PIvan Anchors PA-C  clopidogrel (PLAVIX) 75 MG tablet TAKE 1 TABLET BY MOUTH  DAILY 01/17/22   KTroy Sine MD  guaiFENesin 200 MG tablet Take 1 tablet (200 mg total) by mouth every 6 (six) hours as needed for cough or to loosen  phlegm. Patient not taking: Reported on 09/23/2021 11/04/20   LBary Leriche PA-C  metoprolol succinate (TOPROL-XL) 25 MG 24 hr tablet TAKE 1 AND 1/2 TABLETS BY  MOUTH DAILY 01/17/22   KTroy Sine MD  nicotine polacrilex (NICORETTE) 2 MG gum Take 2 mg by mouth as needed for smoking cessation. Patient not taking: Reported on 09/23/2021    [provider]  nitroGLYCERIN (NITROSTAT) 0.4 MG SL tablet PLACE 1 TABLET UNDER THE TONGUE EVERY 5 MINUTES AS NEEDED FOR CHEST PAIN UP TO 3 DOSES, IF SYMPTOMS PERSIST CALL 911 09/07/20   KTroy Sine MD  REPATHA SURECLICK [id number]/ML SOAJ INJECT 140 MG INTO THE SKIN EVERY 14 DAYS 12/07/21   KTroy Sine MD  sodium chloride (OCEAN) 0.65 % SOLN nasal spray Place 1 spray into both nostrils 2 (two) times daily at 8 am and 10 pm. Patient not taking: Reported on 09/23/2021 11/04/20   LBary Leriche PA-C  SYMBICORT 160-4.5 MCG/ACT inhaler INHALE 2 PUFFS INTO THE LUNGS TWICE DAILY Patient not taking: Reported on 09/23/2021 09/10/21   WMartyn Ehrich NP      Allergies    Niaspan [niacin er] and Statins    Review of Systems   Review of Systems  Physical Exam Updated Vital Signs BP (!) 128/106 (BP Location: Left Arm)   Pulse (!) 113   Temp 98.5 F (36.9 C) (Oral)   Resp 16   Ht  $5' 8"D$  (1.727 m)   Wt 90.7 kg   SpO2 93%   BMI 30.41 kg/m   Physical Exam Vitals and nursing note reviewed.  Constitutional:      Appearance: He is well-developed.     Comments: Slightly increased work of breathing.   HENT:     Head: Normocephalic and atraumatic.     Right Ear: Tympanic membrane, ear canal and external ear normal.     Left Ear: Tympanic membrane, ear canal and external ear normal.     Nose: Nose normal.     Mouth/Throat:     Mouth: Mucous membranes are moist.  Eyes:     General:        Right eye: No discharge.        Left eye: No discharge.     Conjunctiva/sclera: Conjunctivae normal.  Cardiovascular:     Rate and Rhythm: Regular rhythm.  Tachycardia present.     Heart sounds: Normal heart sounds.  Pulmonary:     Effort: Respiratory distress present.     Breath sounds: Wheezing and rales present. No rhonchi.     Comments: Patient with mild tachypnea on exam Abdominal:     Palpations: Abdomen is soft.     Tenderness: There is no abdominal tenderness. There is no guarding or rebound.  Musculoskeletal:     Cervical back: Normal range of motion and neck supple.     Right lower leg: No edema.     Left lower leg: No edema.  Skin:    General: Skin is warm and dry.  Neurological:     Mental Status: He is alert.     ED Results / Procedures / Treatments   Labs (all labs ordered are listed, but only abnormal results are displayed) Labs Reviewed  LACTIC ACID, PLASMA - Abnormal; Notable for the following components:      Result Value   Lactic Acid, Venous 3.5 (*)    All other components within normal limits  COMPREHENSIVE METABOLIC PANEL - Abnormal; Notable for the following components:   Sodium 131 (*)    Chloride 91 (*)    Glucose, Bld 115 (*)    BUN 25 (*)    Calcium 8.1 (*)    Albumin 2.9 (*)    Total Bilirubin 1.3 (*)    All other components within normal limits  CBC WITH DIFFERENTIAL/PLATELET - Abnormal; Notable for the following components:   WBC 28.0 (*)    Neutro Abs 26.1 (*)    Lymphs Abs 0.4 (*)    Basophils Absolute 0.2 (*)    Abs Immature Granulocytes 0.52 (*)    All other components within normal limits  PROTIME-INR - Abnormal; Notable for the following components:   Prothrombin Time 17.1 (*)    INR 1.4 (*)    All other components within normal limits  RESP PANEL BY RT-PCR (RSV, FLU A&B, COVID)  RVPGX2  CULTURE, BLOOD (ROUTINE X 2)  CULTURE, BLOOD (ROUTINE X 2)  APTT  LACTIC ACID, PLASMA  URINALYSIS, ROUTINE W REFLEX MICROSCOPIC    ED ECG REPORT   Date: 10/29/2022  Rate: 117  Rhythm: sinus tachycardia  QRS Axis: right  Intervals: normal  ST/T Wave abnormalities: normal  Conduction  Disutrbances:none  Narrative Interpretation:   Old EKG Reviewed: changes noted, sinus tach today  I have personally reviewed the EKG tracing and agree with the computerized printout as noted.   Radiology DG Chest 2 View  Result Date: 10/29/2022 CLINICAL DATA:  Provided history:  Shortness of breath. Productive cough. EXAM: CHEST - 2 VIEW COMPARISON:  Chest radiograph 06/01/2022 and earlier. FINDINGS: Prior median sternotomy. Heart size within normal is. Extensive airspace opacity within the right upper lobe compatible with pneumonia. To a lesser extent, airspace opacities are also present within the right middle lobe, also consistent with pneumonia. Small right pleural effusion. No appreciable airspace consolidation on the left. No evidence of pneumothorax. No acute bony abnormality identified. IMPRESSION: Findings consistent with right upper and middle lobe pneumonia. Followup PA and lateral chest radiographs are recommended in 3-4 weeks following a trial of antibiotic therapy to ensure resolution and exclude underlying malignancy. Associated small right pleural effusion. Electronically Signed   By: Kellie Simmering D.O.   On: 10/29/2022 11:44    Procedures Procedures    Medications Ordered in ED Medications  lactated ringers infusion ( Intravenous New Bag/Given 10/29/22 1221)  cefTRIAXone (ROCEPHIN) 2 g in sodium chloride 0.9 % 100 mL IVPB (0 g Intravenous Stopped 10/29/22 1241)  azithromycin (ZITHROMAX) 500 mg in sodium chloride 0.9 % 250 mL IVPB (0 mg Intravenous Stopped 10/29/22 1312)  albuterol (PROVENTIL) (2.5 MG/3ML) 0.083% nebulizer solution 5 mg (has no administration in time range)  sodium chloride 0.9 % bolus 1,000 mL (0 mLs Intravenous Stopped 10/29/22 1348)  albuterol (PROVENTIL) (2.5 MG/3ML) 0.083% nebulizer solution 5 mg (5 mg Nebulization Given 10/29/22 1228)  ipratropium (ATROVENT) nebulizer solution 0.5 mg (0.5 mg Nebulization Given 10/29/22 1228)  sodium chloride 0.9 % bolus 1,000  mL (1,000 mLs Intravenous New Bag/Given 10/29/22 1424)    ED Course/ Medical Decision Making/ A&P    Patient seen and examined. History obtained directly from patient.  I have also reviewed urgent care notes.  Labs/EKG: Ordered sepsis workup.  Imaging: Chest x-ray personally reviewed interpreted shows right middle and upper lobe pneumonia.  Medications/Fluids: Ordered: IV antibiotics, IV fluid bolus.   Most recent vital signs reviewed and are as follows: BP (!) 128/106 (BP Location: Left Arm)   Pulse (!) 113   Temp 98.5 F (36.9 C) (Oral)   Resp 16   Ht 5' 8"$  (1.727 m)   Wt 90.7 kg   SpO2 93%   BMI 30.41 kg/m   Initial impression: Patient with positive SIRS criteria and pneumonia.  Sepsis orders placed.  Will give fluid bolus and antibiotics.  Awaiting lactate.  Patient will continue on supplemental oxygen, currently at 3 L nasal cannula.  12:54 PM Reassessment performed. Patient appears stable.  Breathing comfortably.  Labs: Awaiting lab work  Most current vital signs reviewed and are as follows: BP (!) 146/70   Pulse (!) 104   Temp 98.5 F (36.9 C) (Oral)   Resp (!) 26   Ht 5' 8"$  (1.727 m)   Wt 90.7 kg   SpO2 100%   BMI 30.41 kg/m   2:22 PM Reassessment performed.  Rechecked several times.  Remains stable.  Continuing to have pursed lip breathing and wheezing on exam.  Lactate is elevated and additional fluid bolus ordered.  Labs personally reviewed and interpreted including: CBC with elevated white blood cell count 28,000, likely due to infection and demargination from steroids given by EMS; CMP sodium 131, chloride 91, creatinine 1.22, glucose 115; lactate 3.5; PT/INR 1.4; viral panel negative.  Reviewed pertinent lab work and imaging with patient at bedside. Questions answered.   Most current vital signs reviewed and are as follows: BP 125/72   Pulse (!) 110   Temp 98.5 F (36.9 C) (Oral)   Resp [name])  30   Ht 5' 8"$  (1.727 m)   Wt 90.7 kg   SpO2 92%   BMI  30.41 kg/m   Plan: Will admit to hospitalist service.  Discussed with Dr. Johnney Killian.   2:43 PM Consulted with [facility] who will see patient for admission.   CRITICAL CARE Performed by: Carlisle Cater PA-C Total critical care time: 40 minutes Critical care time was exclusive of separately billable procedures and treating other patients. Critical care was necessary to treat or prevent imminent or life-threatening deterioration. Critical care was time spent personally by me on the following activities: development of treatment plan with patient and/or surrogate as well as nursing, discussions with consultants, evaluation of patient's response to treatment, examination of patient, obtaining history from patient or surrogate, ordering and performing treatments and interventions, ordering and review of laboratory studies, ordering and review of radiographic studies, pulse oximetry and re-evaluation of patient's condition.  3:43 PM Notified that lactate 3.5 >> 6.3. Dr. Hollice Gong is also aware.                            Medical Decision Making Amount and/or Complexity of Data Reviewed Labs: ordered. Radiology: ordered.  Risk Prescription drug management. Decision regarding hospitalization.   Patient presents with severe sepsis due to pneumonia, with new oxygen requirement.  This is characterized by tachycardia, hypoxia, elevated white blood cell count and tachypnea in setting of suspected pneumonia.  Lactate is elevated, treated with IV fluids.  He is wheezing, consistent with underlying COPD, treated with nebulizer treatment and with Solu-Medrol and magnesium by EMS.          Final Clinical Impression(s) / ED Diagnoses Final diagnoses:  Community acquired pneumonia of right middle lobe of lung  Sepsis with acute hypoxic respiratory failure without septic shock, due to unspecified organism [facility])  COPD exacerbation [facility])    Rx / DC Orders ED Discharge Orders     None          Carlisle Cater, PA-C 10/29/22 1446    Charlesetta Shanks, MD 10/29/22 1450    Carlisle Cater, PA-C 10/29/22 1544    Charlesetta Shanks, MD 11/03/22 1546

## 2022-10-29 NOTE — ED Triage Notes (Signed)
Per [facility] pt coming from [facility] for respiratory distress. Patient went to [facility] for shortness of breath and COPD exacerbation over past two weeks. Patient states symptoms started after receiving covid and flu vaccine. Patient smokes 0.5 pack a day. Patient given 3 duoneb, 125 solu medrol and 2 g magnesium en route.

## 2022-10-30 DIAGNOSIS — R6520 Severe sepsis without septic shock: Secondary | ICD-10-CM | POA: Diagnosis not present

## 2022-10-30 DIAGNOSIS — A419 Sepsis, unspecified organism: Secondary | ICD-10-CM | POA: Diagnosis not present

## 2022-10-30 LAB — BASIC METABOLIC PANEL
Anion gap: 9 (ref 5–15)
BUN: 24 mg/dL — ABNORMAL HIGH (ref 8–23)
CO2: 23 mmol/L (ref 22–32)
Calcium: 7.6 mg/dL — ABNORMAL LOW (ref 8.9–10.3)
Chloride: 100 mmol/L (ref 98–111)
Creatinine, Ser: 1.22 mg/dL (ref 0.61–1.24)
GFR, Estimated: >60 mL/min (ref >60)
Glucose, Bld: 141 mg/dL — ABNORMAL HIGH (ref 70–99)
Potassium: 3.7 mmol/L (ref 3.5–5.1)
Sodium: 132 mmol/L — ABNORMAL LOW (ref 135–145)

## 2022-10-30 LAB — CBC
HCT: 37.2 % — ABNORMAL LOW (ref 39.0–52.0)
Hemoglobin: 12.1 g/dL — ABNORMAL LOW (ref 13.0–17.0)
MCH: 32.4 pg (ref 26.0–34.0)
MCHC: 32.5 g/dL (ref 30.0–36.0)
MCV: 99.5 fL (ref 80.0–100.0)
Platelets: 166 10*3/uL (ref 150–400)
RBC: 3.74 MIL/uL — ABNORMAL LOW (ref 4.22–5.81)
RDW: 14.1 % (ref 11.5–15.5)
WBC: 26.9 10*3/uL — ABNORMAL HIGH (ref 4.0–10.5)
nRBC: 0 % (ref 0.0–0.2)

## 2022-10-30 LAB — GLUCOSE, CAPILLARY
Glucose-Capillary: 122 mg/dL — ABNORMAL HIGH (ref 70–99)
Glucose-Capillary: 131 mg/dL — ABNORMAL HIGH (ref 70–99)
Glucose-Capillary: 139 mg/dL — ABNORMAL HIGH (ref 70–99)
Glucose-Capillary: 145 mg/dL — ABNORMAL HIGH (ref 70–99)

## 2022-10-30 LAB — PROCALCITONIN: Procalcitonin: 7.71 ng/mL

## 2022-10-30 LAB — LACTIC ACID, PLASMA
Lactic Acid, Venous: 1.5 mmol/L (ref 0.5–1.9)
Lactic Acid, Venous: 1.6 mmol/L (ref 0.5–1.9)

## 2022-10-30 MED ORDER — SODIUM CHLORIDE 0.9 % IV SOLN: INTRAVENOUS | Status: DC

## 2022-10-30 MED ORDER — BENZONATATE 100 MG PO CAPS
200.0000 mg | ORAL_CAPSULE | Freq: Three times a day (TID) | ORAL | Status: DC | PRN
  Administered 2022-10-30 – 2022-11-03 (×10): 200 mg via ORAL
  Filled 2022-10-30 (×11): qty 2

## 2022-10-30 MED ORDER — DM-GUAIFENESIN ER 30-600 MG PO TB12
1.0000 | ORAL_TABLET | Freq: Two times a day (BID) | ORAL | Status: DC
  Administered 2022-10-30 – 2022-11-07 (×17): 1 via ORAL
  Filled 2022-10-30 (×17): qty 1

## 2022-10-30 MED ORDER — CHLORHEXIDINE GLUCONATE CLOTH 2 % EX PADS
6.0000 | MEDICATED_PAD | Freq: Every day | CUTANEOUS | Status: DC
  Administered 2022-10-30 – 2022-11-03 (×4): 6 via TOPICAL

## 2022-10-30 NOTE — Progress Notes (Addendum)
PROGRESS NOTE    Jonathan [name]  [id number] DOB: 1951/07/17 DOA: 10/29/2022 PCP: Leonard Downing, MD   Brief Narrative:   Jonathan [name] is a 72 y.o. male with medical history significant for COPD on room air at baseline, hypertension, hyperlipidemia, CAD status post CABG being admitted to the hospital with severe sepsis due to community-acquired pneumonia complicated by acute exacerbation of COPD.  States that he has been having worsening cough, weakness, subjective fevers and chills for the last 2 to 3 weeks.  went to urgent care  for these complaints.  His oxygen saturation at urgent care today was 84%.  He was placed on oxygen, transferred to the emergency department by EMS, he was given 3 DuoNebs, 125 mg Solu-Medrol and 2 g of magnesium en route.   ED Course: Evaluation here in the emergency department, he was noted to be tachycardic heart rate 117, tachypneic 31, blood pressure 110/72, saturating 100% on 3 L nasal cannula oxygen.  Lab work revealed white blood cell count of 28,000, sodium 131, chloride 91, BUN 25/creatinine 1.22 INR 1.4.  Initial lactate was 3.5, repeat after 2 hours was 6.3.  Assessment & Plan:   Severe sepsis and acute hypoxemic respiratory failure in the setting of multifocal pneumonia and COPD exacerbation: -Patient met criteria for severe sepsis upon admission. was tachycardic, tachypneic, leukocytosis and elevated lactic acid level.  Chest x-ray consistent with multifocal pneumonia. -Currently requiring BiPAP.  On Rocephin and azithromycin.  Remained afebrile. -COVID, flu, RSV all negative.  Blood culture negative till now. -Received IV fluid boluses in ED.  Lactic acid trended down to normal.  Procalcitonin level: 7.70.  Check urine Legionella and strep pneumo antigen -Continue current antibiotics.  Leukocytosis trending down.  Monitor vitals closely -Added Mucinex and Tessalon Perles for the cough.  Continue breathing treatments -Continue Solu-Medrol for  COPD exacerbation  Hypertension: Continue metoprolol.  Blood pressure is elevated.  Will add hydralazine as needed  Coronary artery disease s/p CABG: -Continue aspirin,  Plavix  Hyperglycemia: Per chart.  A1c 5.1 checked yesterday. -On sliding scale insulin.  Will continue to monitor blood sugar while on high-dose steroids.  Obesity with BMI of 32: Diet modification, exercise and weight loss recommended.  Hyponatremia: Mild.  Continue to monitor.  DVT prophylaxis: Lovenox Code Status: DNR Family Communication:  None present at bedside.  Plan of care discussed with patient in length and he verbalized understanding and agreed with it. Disposition Plan: To be determined  Consultants:  None  Procedures:  None  Antimicrobials:  Rocephin Azithromycin  Status is: Inpatient     Subjective: Patient seen and examined.  On BiPAP.  Continues to have copious amount of sputum production and shortness of breath.  No chest pain.  Remained afebrile.  Objective: Vitals:   10/30/22 0845 10/30/22 0848 10/30/22 0900 10/30/22 1039  BP:   (!) 162/100 (!) 153/112  Pulse: 79  83 96  Resp:      Temp:      TempSrc:      SpO2: 98% 98% 98% 98%  Weight:      Height:        Intake/Output Summary (Last 24 hours) at 10/30/2022 1116 Last data filed at 10/30/2022 0906 Gross per 24 hour  Intake 3777.99 ml  Output 950 ml  Net 2827.99 ml   Filed Weights   10/29/22 1106 10/29/22 2212  Weight: 90.7 kg 94.9 kg    Examination:  General exam: Appears calm and comfortable, appears weak, sick  on BiPAP Respiratory system: .  Diffuse rhonchi and mild expiratory wheezing.   Cardiovascular system: S1 & S2 heard, RRR. No JVD, murmurs, rubs, gallops or clicks. No pedal edema. Gastrointestinal system: Abdomen is nondistended, soft and nontender. No organomegaly or masses felt. Normal bowel sounds heard. Central nervous system: Alert and oriented. No focal neurological deficits. Extremities: Symmetric  5 x 5 power. Skin: No rashes, lesions or ulcers    Data Reviewed: I have personally reviewed following labs and imaging studies  CBC: Recent Labs  Lab 10/29/22 1213 10/30/22 0257  WBC 28.0* 26.9*  NEUTROABS 26.1*  --   HGB 15.1 12.1*  HCT 46.4 37.2*  MCV 100.0 99.5  PLT 189 [id number]   Basic Metabolic Panel: Recent Labs  Lab 10/29/22 1213 10/30/22 0257  NA 131* 132*  K 3.6 3.7  CL 91* 100  CO2 25 23  GLUCOSE 115* 141*  BUN 25* 24*  CREATININE 1.22 1.22  CALCIUM 8.1* 7.6*   GFR: Estimated Creatinine Clearance: 59.2 mL/min (by C-G formula based on SCr of 1.22 mg/dL). Liver Function Tests: Recent Labs  Lab 10/29/22 1213  AST 37  ALT 14  ALKPHOS 64  BILITOT 1.3*  PROT 7.1  ALBUMIN 2.9*   No results for input(s): "LIPASE", "AMYLASE" in the last 168 hours. No results for input(s): "AMMONIA" in the last 168 hours. Coagulation Profile: Recent Labs  Lab 10/29/22 1213  INR 1.4*   Cardiac Enzymes: No results for input(s): "CKTOTAL", "CKMB", "CKMBINDEX", "TROPONINI" in the last 168 hours. BNP (last 3 results) No results for input(s): "PROBNP" in the last 8760 hours. HbA1C: Recent Labs    10/29/22 1213  HGBA1C 5.1   CBG: Recent Labs  Lab 10/29/22 1607 10/30/22 0018 10/30/22 0751  GLUCAP 155* 145* 131*   Lipid Profile: No results for input(s): "CHOL", "HDL", "LDLCALC", "TRIG", "CHOLHDL", "LDLDIRECT" in the last 72 hours. Thyroid Function Tests: No results for input(s): "TSH", "T4TOTAL", "FREET4", "T3FREE", "THYROIDAB" in the last 72 hours. Anemia Panel: No results for input(s): "VITAMINB12", "FOLATE", "FERRITIN", "TIBC", "IRON", "RETICCTPCT" in the last 72 hours. Sepsis Labs: Recent Labs  Lab 10/29/22 1716 10/29/22 2209 10/30/22 0723 10/30/22 0949  PROCALCITON  --   --  7.71  --   LATICACIDVEN 4.4* 3.0* 1.5 1.6    Recent Results (from the past 240 hour(s))  Resp panel by RT-PCR (RSV, Flu A&B, Covid) Anterior Nasal Swab     Status: None    Collection Time: 10/29/22 11:08 AM   Specimen: Anterior Nasal Swab  Result Value Ref Range Status   SARS Coronavirus 2 by RT PCR NEGATIVE NEGATIVE Final    Comment: (NOTE) SARS-CoV-2 target nucleic acids are NOT DETECTED.  The SARS-CoV-2 RNA is generally detectable in upper respiratory specimens during the acute phase of infection. The lowest concentration of SARS-CoV-2 viral copies this assay can detect is 138 copies/mL. A negative result does not preclude SARS-Cov-2 infection and should not be used as the sole basis for treatment or other patient management decisions. A negative result may occur with  improper specimen collection/handling, submission of specimen other than nasopharyngeal swab, presence of viral mutation(s) within the areas targeted by this assay, and inadequate number of viral copies(<138 copies/mL). A negative result must be combined with clinical observations, patient history, and epidemiological information. The expected result is Negative.  Fact Sheet for Patients:  EntrepreneurPulse.com.au  Fact Sheet for Healthcare Providers:  IncredibleEmployment.be  This test is no t yet approved or cleared by the [place] and  has been authorized for detection and/or diagnosis of SARS-CoV-2 by FDA under an Emergency Use Authorization (EUA). This EUA will remain  in effect (meaning this test can be used) for the duration of the COVID-19 declaration under Section 564(b)(1) of the Act, 21 U.S.C.section 360bbb-3(b)(1), unless the authorization is terminated  or revoked sooner.       Influenza A by PCR NEGATIVE NEGATIVE Final   Influenza B by PCR NEGATIVE NEGATIVE Final    Comment: (NOTE) The Xpert Xpress SARS-CoV-2/FLU/RSV plus assay is intended as an aid in the diagnosis of influenza from Nasopharyngeal swab specimens and should not be used as a sole basis for treatment. Nasal washings and aspirates are unacceptable for  Xpert Xpress SARS-CoV-2/FLU/RSV testing.  Fact Sheet for Patients: EntrepreneurPulse.com.au  Fact Sheet for Healthcare Providers: IncredibleEmployment.be  This test is not yet approved or cleared by the [place] FDA and has been authorized for detection and/or diagnosis of SARS-CoV-2 by FDA under an Emergency Use Authorization (EUA). This EUA will remain in effect (meaning this test can be used) for the duration of the COVID-19 declaration under Section 564(b)(1) of the Act, 21 U.S.C. section 360bbb-3(b)(1), unless the authorization is terminated or revoked.     Resp Syncytial Virus by PCR NEGATIVE NEGATIVE Final    Comment: (NOTE) Fact Sheet for Patients: EntrepreneurPulse.com.au  Fact Sheet for Healthcare Providers: IncredibleEmployment.be  This test is not yet approved or cleared by the [place] FDA and has been authorized for detection and/or diagnosis of SARS-CoV-2 by FDA under an Emergency Use Authorization (EUA). This EUA will remain in effect (meaning this test can be used) for the duration of the COVID-19 declaration under Section 564(b)(1) of the Act, 21 U.S.C. section 360bbb-3(b)(1), unless the authorization is terminated or revoked.  Performed at [facility], [place] [address], [place], [place] [address]   Blood Culture (routine x 2)     Status: None (Preliminary result)   Collection Time: 10/29/22 12:01 PM   Specimen: BLOOD RIGHT HAND  Result Value Ref Range Status   Specimen Description   Final    BLOOD RIGHT HAND Performed at [facility], [place] [address] [place], [place] [address]    Special Requests   Final    BOTTLES DRAWN AEROBIC AND ANAEROBIC Blood Culture results may not be optimal due to an inadequate volume of blood received in culture bottles Performed at [facility] [address] [place], [place] [address]    Culture    Final    NO GROWTH < 24 HOURS Performed at [facility] [address], [place], [place] [address]    Report Status PENDING  Incomplete  Blood Culture (routine x 2)     Status: None (Preliminary result)   Collection Time: 10/29/22 12:13 PM   Specimen: BLOOD  Result Value Ref Range Status   Specimen Description   Final    BLOOD RIGHT ANTECUBITAL Performed at [facility] [address] [place], [place] [address]    Special Requests   Final    BOTTLES DRAWN AEROBIC AND ANAEROBIC Blood Culture adequate volume Performed at [facility] [address], [place], [place] [address]    Culture   Final    NO GROWTH < 24 HOURS Performed at [facility] [address], [place],  [address]    Report Status PENDING  Incomplete  MRSA Next Gen by PCR, Nasal     Status: None  Collection Time: 10/29/22  9:43 PM   Specimen: Nasal Mucosa; Nasal Swab  Result Value Ref Range Status   MRSA by PCR Next Gen NOT DETECTED NOT DETECTED Final    Comment: (NOTE) The GeneXpert MRSA Assay (FDA approved for NASAL specimens only), is one component of a comprehensive MRSA colonization surveillance program. It is not intended to diagnose MRSA infection nor to guide or monitor treatment for MRSA infections. Test performance is not FDA approved in patients less than 26 years old. Performed at [facility], [place] [address], [place], [place] [address]       Radiology Studies: DG Chest 2 View  Result Date: 10/29/2022 CLINICAL DATA:  Provided history: Shortness of breath. Productive cough. EXAM: CHEST - 2 VIEW COMPARISON:  Chest radiograph 06/01/2022 and earlier. FINDINGS: Prior median sternotomy. Heart size within normal is. Extensive airspace opacity within the right upper lobe compatible with pneumonia. To a lesser extent, airspace opacities are also present within the right middle lobe, also consistent with pneumonia. Small right  pleural effusion. No appreciable airspace consolidation on the left. No evidence of pneumothorax. No acute bony abnormality identified. IMPRESSION: Findings consistent with right upper and middle lobe pneumonia. Followup PA and lateral chest radiographs are recommended in 3-4 weeks following a trial of antibiotic therapy to ensure resolution and exclude underlying malignancy. Associated small right pleural effusion. Electronically Signed   By: Kellie Simmering D.O.   On: 10/29/2022 11:44    Scheduled Meds:  aspirin  81 mg Oral Daily   Chlorhexidine Gluconate Cloth  6 each Topical Daily   clopidogrel  75 mg Oral Daily   dextromethorphan-guaiFENesin  1 tablet Oral BID   docusate sodium  100 mg Oral BID   enoxaparin (LOVENOX) injection  40 mg Subcutaneous Q24H   insulin aspart  0-15 Units Subcutaneous TID WC   insulin aspart  0-5 Units Subcutaneous QHS   ipratropium-albuterol  3 mL Nebulization QID   methylPREDNISolone (SOLU-MEDROL) injection  40 mg Intravenous Q8H   metoprolol succinate  37.5 mg Oral Daily   Continuous Infusions:  azithromycin Stopped (10/29/22 1312)   cefTRIAXone (ROCEPHIN)  IV Stopped (10/29/22 1241)     LOS: 1 day   Time spent: 35 minutes   Brynja Marker Loann Quill, MD [facility]  If 7PM-7AM, please contact night-coverage [email or web address] 10/30/2022, 11:16 AM

## 2022-10-30 NOTE — Progress Notes (Signed)
No need of bipap at this time. Pt is resting and no sign of resp distress, Machine remained bedside and RN aware to let RT know if any assistance needed.

## 2022-10-31 DIAGNOSIS — R6520 Severe sepsis without septic shock: Secondary | ICD-10-CM | POA: Diagnosis not present

## 2022-10-31 DIAGNOSIS — A419 Sepsis, unspecified organism: Secondary | ICD-10-CM | POA: Diagnosis not present

## 2022-10-31 LAB — BASIC METABOLIC PANEL
Anion gap: 10 (ref 5–15)
BUN: 34 mg/dL — ABNORMAL HIGH (ref 8–23)
CO2: 17 mmol/L — ABNORMAL LOW (ref 22–32)
Calcium: 7.7 mg/dL — ABNORMAL LOW (ref 8.9–10.3)
Chloride: 107 mmol/L (ref 98–111)
Creatinine, Ser: 0.94 mg/dL (ref 0.61–1.24)
GFR, Estimated: >60 mL/min (ref >60)
Glucose, Bld: 115 mg/dL — ABNORMAL HIGH (ref 70–99)
Potassium: 4.8 mmol/L (ref 3.5–5.1)
Sodium: 134 mmol/L — ABNORMAL LOW (ref 135–145)

## 2022-10-31 LAB — CBC
HCT: 47.9 % (ref 39.0–52.0)
Hemoglobin: 15.5 g/dL (ref 13.0–17.0)
MCH: 33.1 pg (ref 26.0–34.0)
MCHC: 32.4 g/dL (ref 30.0–36.0)
MCV: 102.4 fL — ABNORMAL HIGH (ref 80.0–100.0)
Platelets: 214 10*3/uL (ref 150–400)
RBC: 4.68 MIL/uL (ref 4.22–5.81)
RDW: 14.4 % (ref 11.5–15.5)
WBC: 23.4 10*3/uL — ABNORMAL HIGH (ref 4.0–10.5)
nRBC: 0 % (ref 0.0–0.2)

## 2022-10-31 LAB — GLUCOSE, CAPILLARY
Glucose-Capillary: 116 mg/dL — ABNORMAL HIGH (ref 70–99)
Glucose-Capillary: 104 mg/dL — ABNORMAL HIGH (ref 70–99)
Glucose-Capillary: 136 mg/dL — ABNORMAL HIGH (ref 70–99)
Glucose-Capillary: 119 mg/dL — ABNORMAL HIGH (ref 70–99)
Glucose-Capillary: 121 mg/dL — ABNORMAL HIGH (ref 70–99)

## 2022-10-31 LAB — STREP PNEUMONIAE URINARY ANTIGEN: Strep Pneumo Urinary Antigen: POSITIVE — AB

## 2022-10-31 LAB — PROCALCITONIN: Procalcitonin: 5.22 ng/mL

## 2022-10-31 MED ORDER — SODIUM CHLORIDE 3 % IN NEBU
4.0000 mL | INHALATION_SOLUTION | Freq: Every day | RESPIRATORY_TRACT | Status: AC
  Administered 2022-10-31 – 2022-11-02 (×3): 4 mL via RESPIRATORY_TRACT
  Filled 2022-10-31 (×3): qty 4

## 2022-10-31 NOTE — Evaluation (Signed)
Clinical/Bedside Swallow Evaluation Patient Details  Name: DHAVAL RADCLIFF MRN: [id number] Date of Birth: [birth year]  Today's Date: 10/31/2022 Time: SLP Start Time (ACUTE ONLY): 0950 SLP Stop Time (ACUTE ONLY): 1005 SLP Time Calculation (min) (ACUTE ONLY): 15 min  Past Medical History:  Past Medical History:  Diagnosis Date   Arthritis    hands    Chronic bronchitis (HCC)    Chronic sinusitis of both maxillary sinuses 04/16/2018   Sinus CT 04/25/2018 Mild mucoperiosteal thickening involving the frontoethmoidal sinuses. Otherwise largely clear sinuses with no evidence for acute sinusitis.    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 (HCC)    DOE (dyspnea on exertion) 04/16/2018   Essential hypertension, benign 09/23/2012   Changed acei to arb 04/16/2018 due to pseudowheeze > improved 06/08/2018    Hernia, inguinal 09/23/2012   Hyperlipidemia    has not tolerated statins,  would not pursue PSK9 inhibitor as advised by Dr Claiborne Billings   Hypertension    Myocardial infarction [facility])    NSTEMI (non-ST elevated myocardial infarction) ([place]) 09/23/2012   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   Tobacco abuse    Past Surgical History:  Past Surgical History:  Procedure Laterality Date   CARDIAC CATHETERIZATION     CARDIOVASCULAR STRESS TEST  11/11/2011   CORONARY ANGIOPLASTY     stents- 01/2004    CORONARY ARTERY BYPASS GRAFT N/A 10/15/2020   Procedure: CORONARY ARTERY BYPASS GRAFTING (CABG) TIMES FOUR  USING BILATERAL INTERNAL MAMMARY ARTERIES AND ENDOSCOPICALLY HARVESTED GREATER SAPHENOUS VEIN AND INSERTION OF 50CC INTRAAORTIC BALLOON PUMP;  Surgeon: [name], MD;  Location: [place];  Service: Open Heart Surgery;  Laterality: N/A;  possible BIMA   ENDOVEIN HARVEST OF GREATER SAPHENOUS VEIN Right 10/15/2020   Procedure: ENDOVEIN HARVEST OF GREATER SAPHENOUS VEIN;  Surgeon: [name], MD;  Location: [place];  Service: Open Heart Surgery;  Laterality: Right;   heart stents  01/2004   INGUINAL HERNIA REPAIR Right 12/06/2012   Procedure: HERNIA REPAIR INGUINAL ADULT;  Surgeon: [name], DO;  Location: [facility];  Service: General;  Laterality: Right;   INSERTION OF MESH Right 12/06/2012   Procedure: INSERTION OF MESH;  Surgeon: [name], DO;  Location: [facility];  Service: General;  Laterality: Right;   LEFT HEART CATH AND CORONARY ANGIOGRAPHY N/A 07/22/2019   Procedure: LEFT HEART CATH AND CORONARY ANGIOGRAPHY;  Surgeon: [name], MD;  Location: [place] [facility];  Service: Cardiovascular;  Laterality: N/A;   LEFT HEART CATH AND CORONARY ANGIOGRAPHY N/A 10/14/2020   Procedure: LEFT HEART CATH AND CORONARY ANGIOGRAPHY;  Surgeon: [name], MD;  Location: [place] [facility];  Service: Cardiovascular;  Laterality: N/A;   TEE WITHOUT CARDIOVERSION N/A 10/15/2020   Procedure: TRANSESOPHAGEAL ECHOCARDIOGRAM (TEE);  Surgeon: [name], MD;  Location: [place];  Service: Open Heart Surgery;  Laterality: N/A;   HPI:  Patient is a 72 y.o. male with PMH: COPD on RA at baseline, HTN, HLD, CAD s/p CABG, dysphagia (in AIR 2022), MI, NSTEMI, active smoker (1/2 ppd), chronic bronchitis. He presented to the ED on 10/29/22 with worsening cough, weakness, subjective fevers and chills for 2-3 days prior to admission. For past week, he has been more dyspneic with exertion. He went to Urgent Care and oxygen at that time was 84%. He was place on oxygen and transferred to ED via EMS. In ED, he was  tachycardic, oxygen saturation 100% on 3L oxygen via nasal cannula. CXR findings consistent with right upper and middle lobe PNA.    Assessment / Plan / Recommendation  Clinical Impression  Patient not currently presenting with clinical s/s of dysphagia as per this bedside swallow evaluation. Prior to entering room, SLP reviewed patient's MBS report from previous hospitalization in 2022 which reported no aspiration and only one incidence of penetration with thin liquids. When SLP arrived for today's evaluation, he was awake, alert, breakfast tray in front of him which was largely untouched. Patient was observed taking the rest of his medications (a few small pills) which he took whole with water. He was observed to have to tilt head back and shake head some which seemed to be his own technique for oral transit of pills. (Of note, during MBS in 2022, SLP did note that patient had difficulty with oral transit of barium tablet when taken with thin liquid barium.) Swallow initiation appeared timely and no overt s/s aspiration or penetration. SLP discussed potential for doing a repeat MBS to r/o aspiration in light of new PNA diagnosis. Patient shook his head, saying "no" but did not appear to be completly refusing, just expressing he did not think that was necessary. Patient then agreeable (though somewhat reluctantly) to eat breakfast. He asked that SLP stand out of his line of sight. No significant oral difficulty with solids or liquids and no overt s/s aspiration or penetration. His SpO2 did drop to low 80% briefly but for majority of PO intake, was maintained at 92-94%. SLP not recommending further intervention at this time. SLP Visit Diagnosis: Dysphagia, unspecified (R13.10)    Aspiration Risk  No limitations;Mild aspiration risk    Diet Recommendation Regular;Dysphagia 3 (Mech soft);Thin liquid   Liquid Administration via: Cup;Straw Medication Administration: Whole meds with puree Supervision:  Patient able to self feed Compensations: Slow rate;Small sips/bites Postural Changes: Seated upright at 90 degrees    Other  Recommendations Oral Care Recommendations: Oral care BID    Recommendations for follow up therapy are one component of a multi-disciplinary discharge planning process, led by the attending physician.  Recommendations may be updated based on patient status, additional functional criteria and insurance authorization.  Follow up Recommendations No SLP follow up      Assistance Recommended at Discharge  N/A  Functional Status Assessment Patient has not had a recent decline in their functional status  Frequency and Duration   N/A         Prognosis        Swallow Study   General Date of Onset: 10/31/22 HPI: Patient is a 72 y.o. male with PMH: COPD on RA at baseline, HTN, HLD, CAD s/p CABG, dysphagia (in AIR 2022), MI, NSTEMI, active smoker (1/2 ppd), chronic bronchitis. He presented to the ED on 10/29/22 with worsening cough, weakness, subjective fevers and chills for 2-3 days prior to admission. For past  week, he has been more dyspneic with exertion. He went to Urgent Care and oxygen at that time was 84%. He was place on oxygen and transferred to ED via EMS. In ED, he was tachycardic, oxygen saturation 100% on 3L oxygen via nasal cannula. CXR findings consistent with right upper and middle lobe PNA. Type of Study: Bedside Swallow Evaluation Previous Swallow Assessment: during previous admission in AIR 2022; MBS showed one instance of penetration but otherwise good airway protection Diet Prior to this Study: Regular;Thin liquids (Level 0) Temperature Spikes Noted: No Respiratory Status: Nasal cannula History of Recent Intubation: No Behavior/Cognition: Alert;Cooperative Oral Cavity Assessment: Within Functional Limits Oral Care Completed by SLP: No Oral Cavity - Dentition: Adequate natural dentition Vision: Functional for self-feeding Self-Feeding Abilities: Able  to feed self Patient Positioning: Upright in bed Baseline Vocal Quality: Normal;Hoarse Volitional Cough: Strong Volitional Swallow: Able to elicit    Oral/Motor/Sensory Function Overall Oral Motor/Sensory Function: Within functional limits   Ice Chips     Thin Liquid Thin Liquid: Within functional limits Presentation: Self Fed;Straw    Nectar Thick     Honey Thick     Puree Puree: Not tested   Solid     Solid: Within functional limits Presentation: Self Fed     Sonia Baller, MA, CCC-SLP Speech Therapy

## 2022-10-31 NOTE — Progress Notes (Signed)
PROGRESS NOTE    Jonathan [name]  [id number] DOB: 25-Apr-1951 DOA: 10/29/2022 PCP: Leonard Downing, MD   Brief Narrative:   Jonathan [name] is a 72 y.o. male with medical history significant for COPD on room air at baseline, hypertension, hyperlipidemia, CAD status post CABG being admitted to the hospital with severe sepsis due to community-acquired pneumonia complicated by acute exacerbation of COPD.  States that he has been having worsening cough, weakness, subjective fevers and chills for the last 2 to 3 weeks.  went to urgent care  for these complaints.  His oxygen saturation at urgent care today was 84%.  He was placed on oxygen, transferred to the emergency department by EMS, he was given 3 DuoNebs, 125 mg Solu-Medrol and 2 g of magnesium en route.   ED Course: Evaluation here in the emergency department, he was noted to be tachycardic heart rate 117, tachypneic 31, blood pressure 110/72, saturating 100% on 3 L nasal cannula oxygen.  Lab work revealed white blood cell count of 28,000, sodium 131, chloride 91, BUN 25/creatinine 1.22 INR 1.4.  Initial lactate was 3.5, repeat after 2 hours was 6.3.  Assessment & Plan:   Severe sepsis and acute hypoxemic respiratory failure in the setting of multifocal pneumonia and COPD exacerbation: -Patient met criteria for severe sepsis upon admission. was tachycardic, tachypneic, leukocytosis and elevated lactic acid level.  Chest x-ray consistent with multifocal pneumonia. -was requiring BiPAP.  Currently on HFNC.  On Rocephin and azithromycin.  Remained afebrile. -COVID, flu, RSV all negative.  Blood culture negative till now. -Received IV fluid boluses in ED.  Lactic acid trended down to normal.  Procalcitonin level: 7.70.  -Strep pneumo antigen positive.  Urine Legionella is pending -Leukocytosis trending down.   -Continue Mucinex and Tessalon Perles for the cough.  Continue breathing treatments.   -Start chest PT and 3% hypertonic neb  treatment -Continue Solu-Medrol for COPD exacerbation.  Hypertension: Continue metoprolol.  Blood pressure is elevated.  Will add hydralazine as needed  Coronary artery disease s/p CABG: -Continue aspirin,  Plavix  Hyperglycemia: Last A1c 5.1 . -On sliding scale insulin.  Will continue to monitor blood sugar while on high-dose steroids.  Obesity with BMI of 32: Diet modification, exercise and weight loss recommended.  Hyponatremia: Mild.  Continue to monitor.  Constipation: No bowel movement since admission. -Continue stool softeners.  DVT prophylaxis: Lovenox Code Status: DNR Family Communication:  None present at bedside.  Plan of care discussed with patient in length and he verbalized understanding and agreed with it. Disposition Plan: To be determined  Consultants:  None  Procedures:  None  Antimicrobials:  Rocephin Azithromycin  Status is: Inpatient     Subjective: Patient seen and examined.  Appears weak, lethargic, on HFNC.  Did not require BiPAP overnight.  Remained afebrile.  Reports his breathing is same, no improvement.  Denies chest pain.  No acute events overnight.  Objective: Vitals:   10/31/22 0600 10/31/22 0700 10/31/22 0808 10/31/22 0909  BP: (!) 149/114 (!) 129/51 (!) 158/77   Pulse: 61 65 62 80  Resp: 18 (!) 21 20 17  $ Temp:   97.6 F (36.4 C)   TempSrc:   Axillary   SpO2: 100% 98% 96% 100%  Weight:      Height:        Intake/Output Summary (Last 24 hours) at 10/31/2022 0953 Last data filed at 10/31/2022 0800 Gross per 24 hour  Intake 1843.1 ml  Output 700 ml  Net 1143.1  ml    Filed Weights   10/29/22 1106 10/29/22 2212  Weight: 90.7 kg 94.9 kg    Examination:  General exam: Appears calm and comfortable, appears weak, sick on HFNC  respiratory system: .  Diffuse rhonchi and mild expiratory wheezing.   Cardiovascular system: S1 & S2 heard, RRR. No JVD, murmurs, rubs, gallops or clicks. No pedal edema. Gastrointestinal system:  Abdomen is mildly distended, firm, some tenderness on palpation, no guarding, no rigidity. No organomegaly or masses felt. Normal bowel sounds heard. Central nervous system: Alert and oriented. No focal neurological deficits. Extremities: Symmetric 5 x 5 power. Skin: No rashes, lesions or ulcers    Data Reviewed: I have personally reviewed following labs and imaging studies  CBC: Recent Labs  Lab 10/29/22 1213 10/30/22 0257  WBC 28.0* 26.9*  NEUTROABS 26.1*  --   HGB 15.1 12.1*  HCT 46.4 37.2*  MCV 100.0 99.5  PLT 189 [id number]    Basic Metabolic Panel: Recent Labs  Lab 10/29/22 1213 10/30/22 0257 10/31/22 0331  NA 131* 132* 134*  K 3.6 3.7 4.8  CL 91* 100 107  CO2 25 23 17*  GLUCOSE 115* 141* 115*  BUN 25* 24* 34*  CREATININE 1.22 1.22 0.94  CALCIUM 8.1* 7.6* 7.7*    GFR: Estimated Creatinine Clearance: 76.8 mL/min (by C-G formula based on SCr of 0.94 mg/dL). Liver Function Tests: Recent Labs  Lab 10/29/22 1213  AST 37  ALT 14  ALKPHOS 64  BILITOT 1.3*  PROT 7.1  ALBUMIN 2.9*    No results for input(s): "LIPASE", "AMYLASE" in the last 168 hours. No results for input(s): "AMMONIA" in the last 168 hours. Coagulation Profile: Recent Labs  Lab 10/29/22 1213  INR 1.4*    Cardiac Enzymes: No results for input(s): "CKTOTAL", "CKMB", "CKMBINDEX", "TROPONINI" in the last 168 hours. BNP (last 3 results) No results for input(s): "PROBNP" in the last 8760 hours. HbA1C: Recent Labs    10/29/22 1213  HGBA1C 5.1    CBG: Recent Labs  Lab 10/30/22 0751 10/30/22 1129 10/30/22 1635 10/30/22 2112 10/31/22 0800  GLUCAP 131* 122* 139* 116* 104*    Lipid Profile: No results for input(s): "CHOL", "HDL", "LDLCALC", "TRIG", "CHOLHDL", "LDLDIRECT" in the last 72 hours. Thyroid Function Tests: No results for input(s): "TSH", "T4TOTAL", "FREET4", "T3FREE", "THYROIDAB" in the last 72 hours. Anemia Panel: No results for input(s): "VITAMINB12", "FOLATE", "FERRITIN",  "TIBC", "IRON", "RETICCTPCT" in the last 72 hours. Sepsis Labs: Recent Labs  Lab 10/29/22 1716 10/29/22 2209 10/30/22 0723 10/30/22 0949  PROCALCITON  --   --  7.71  --   LATICACIDVEN 4.4* 3.0* 1.5 1.6     Recent Results (from the past 240 hour(s))  Resp panel by RT-PCR (RSV, Flu A&B, Covid) Anterior Nasal Swab     Status: None   Collection Time: 10/29/22 11:08 AM   Specimen: Anterior Nasal Swab  Result Value Ref Range Status   SARS Coronavirus 2 by RT PCR NEGATIVE NEGATIVE Final    Comment: (NOTE) SARS-CoV-2 target nucleic acids are NOT DETECTED.  The SARS-CoV-2 RNA is generally detectable in upper respiratory specimens during the acute phase of infection. The lowest concentration of SARS-CoV-2 viral copies this assay can detect is 138 copies/mL. A negative result does not preclude SARS-Cov-2 infection and should not be used as the sole basis for treatment or other patient management decisions. A negative result may occur with  improper specimen collection/handling, submission of specimen other than nasopharyngeal swab, presence of viral mutation(s) within  the areas targeted by this assay, and inadequate number of viral copies(<138 copies/mL). A negative result must be combined with clinical observations, patient history, and epidemiological information. The expected result is Negative.  Fact Sheet for Patients:  EntrepreneurPulse.com.au  Fact Sheet for Healthcare Providers:  IncredibleEmployment.be  This test is no t yet approved or cleared by the [place] FDA and  has been authorized for detection and/or diagnosis of SARS-CoV-2 by FDA under an Emergency Use Authorization (EUA). This EUA will remain  in effect (meaning this test can be used) for the duration of the COVID-19 declaration under Section 564(b)(1) of the Act, 21 U.S.C.section 360bbb-3(b)(1), unless the authorization is terminated  or revoked sooner.        Influenza A by PCR NEGATIVE NEGATIVE Final   Influenza B by PCR NEGATIVE NEGATIVE Final    Comment: (NOTE) The Xpert Xpress SARS-CoV-2/FLU/RSV plus assay is intended as an aid in the diagnosis of influenza from Nasopharyngeal swab specimens and should not be used as a sole basis for treatment. Nasal washings and aspirates are unacceptable for Xpert Xpress SARS-CoV-2/FLU/RSV testing.  Fact Sheet for Patients: EntrepreneurPulse.com.au  Fact Sheet for Healthcare Providers: IncredibleEmployment.be  This test is not yet approved or cleared by the [place] FDA and has been authorized for detection and/or diagnosis of SARS-CoV-2 by FDA under an Emergency Use Authorization (EUA). This EUA will remain in effect (meaning this test can be used) for the duration of the COVID-19 declaration under Section 564(b)(1) of the Act, 21 U.S.C. section 360bbb-3(b)(1), unless the authorization is terminated or revoked.     Resp Syncytial Virus by PCR NEGATIVE NEGATIVE Final    Comment: (NOTE) Fact Sheet for Patients: EntrepreneurPulse.com.au  Fact Sheet for Healthcare Providers: IncredibleEmployment.be  This test is not yet approved or cleared by the [place] FDA and has been authorized for detection and/or diagnosis of SARS-CoV-2 by FDA under an Emergency Use Authorization (EUA). This EUA will remain in effect (meaning this test can be used) for the duration of the COVID-19 declaration under Section 564(b)(1) of the Act, 21 U.S.C. section 360bbb-3(b)(1), unless the authorization is terminated or revoked.  Performed at [facility], [place] [address], [place], [place] [address]   Blood Culture (routine x 2)     Status: None (Preliminary result)   Collection Time: 10/29/22 12:01 PM   Specimen: BLOOD RIGHT HAND  Result Value Ref Range Status   Specimen Description   Final    BLOOD RIGHT  HAND Performed at [facility], [place] [address], [place], [place] [address]    Special Requests   Final    BOTTLES DRAWN AEROBIC AND ANAEROBIC Blood Culture results may not be optimal due to an inadequate volume of blood received in culture bottles Performed at [facility] [address], [place], [place] [address]    Culture   Final    NO GROWTH 2 DAYS Performed at [facility] [address] [place], [place] [address]    Report Status PENDING  Incomplete  Blood Culture (routine x 2)     Status: None (Preliminary result)   Collection Time: 10/29/22 12:13 PM   Specimen: BLOOD  Result Value Ref Range Status   Specimen Description   Final    BLOOD RIGHT ANTECUBITAL Performed at [facility] [address], [place], [place] [address]    Special Requests   Final    BOTTLES DRAWN AEROBIC AND ANAEROBIC Blood Culture adequate  volume Performed at [facility], [place] [address] [place], [place] [address]    Culture   Final    NO GROWTH 2 DAYS Performed at [facility] [address] [place], [place] [address]    Report Status PENDING  Incomplete  MRSA Next Gen by PCR, Nasal     Status: None   Collection Time: 10/29/22  9:43 PM   Specimen: Nasal Mucosa; Nasal Swab  Result Value Ref Range Status   MRSA by PCR Next Gen NOT DETECTED NOT DETECTED Final    Comment: (NOTE) The GeneXpert MRSA Assay (FDA approved for NASAL specimens only), is one component of a comprehensive MRSA colonization surveillance program. It is not intended to diagnose MRSA infection nor to guide or monitor treatment for MRSA infections. Test performance is not FDA approved in patients less than 49 years old. Performed at [facility], [place] [address] [place], [place] [address]       Radiology Studies: DG Chest 2 View  Result Date: 10/29/2022 CLINICAL DATA:  Provided history: Shortness of breath. Productive  cough. EXAM: CHEST - 2 VIEW COMPARISON:  Chest radiograph 06/01/2022 and earlier. FINDINGS: Prior median sternotomy. Heart size within normal is. Extensive airspace opacity within the right upper lobe compatible with pneumonia. To a lesser extent, airspace opacities are also present within the right middle lobe, also consistent with pneumonia. Small right pleural effusion. No appreciable airspace consolidation on the left. No evidence of pneumothorax. No acute bony abnormality identified. IMPRESSION: Findings consistent with right upper and middle lobe pneumonia. Followup PA and lateral chest radiographs are recommended in 3-4 weeks following a trial of antibiotic therapy to ensure resolution and exclude underlying malignancy. Associated small right pleural effusion. Electronically Signed   By: Kellie Simmering D.O.   On: 10/29/2022 11:44    Scheduled Meds:  aspirin  81 mg Oral Daily   Chlorhexidine Gluconate Cloth  6 each Topical Daily   clopidogrel  75 mg Oral Daily   dextromethorphan-guaiFENesin  1 tablet Oral BID   docusate sodium  100 mg Oral BID   enoxaparin (LOVENOX) injection  40 mg Subcutaneous Q24H   insulin aspart  0-15 Units Subcutaneous TID WC   insulin aspart  0-5 Units Subcutaneous QHS   ipratropium-albuterol  3 mL Nebulization QID   methylPREDNISolone (SOLU-MEDROL) injection  40 mg Intravenous Q8H   metoprolol succinate  37.5 mg Oral Daily   sodium chloride HYPERTONIC  4 mL Nebulization Daily   Continuous Infusions:  sodium chloride 100 mL/hr at 10/31/22 0800   azithromycin Stopped (10/30/22 1417)   cefTRIAXone (ROCEPHIN)  IV Stopped (10/30/22 1237)     LOS: 2 days   Time spent: 35 minutes   Jesslyn Viglione Loann Quill, MD [facility]  If 7PM-7AM, please contact night-coverage [email or web address] 10/31/2022, 9:53 AM

## 2022-11-01 DIAGNOSIS — R6520 Severe sepsis without septic shock: Secondary | ICD-10-CM | POA: Diagnosis not present

## 2022-11-01 DIAGNOSIS — A419 Sepsis, unspecified organism: Secondary | ICD-10-CM | POA: Diagnosis not present

## 2022-11-01 LAB — BASIC METABOLIC PANEL
Anion gap: 8 (ref 5–15)
BUN: 40 mg/dL — ABNORMAL HIGH (ref 8–23)
CO2: 25 mmol/L (ref 22–32)
Calcium: 7.8 mg/dL — ABNORMAL LOW (ref 8.9–10.3)
Chloride: 106 mmol/L (ref 98–111)
Creatinine, Ser: 1.07 mg/dL (ref 0.61–1.24)
GFR, Estimated: >60 mL/min (ref >60)
Glucose, Bld: 126 mg/dL — ABNORMAL HIGH (ref 70–99)
Potassium: 4.3 mmol/L (ref 3.5–5.1)
Sodium: 139 mmol/L (ref 135–145)

## 2022-11-01 LAB — MAGNESIUM: Magnesium: 2.5 mg/dL — ABNORMAL HIGH (ref 1.7–2.4)

## 2022-11-01 LAB — EXPECTORATED SPUTUM ASSESSMENT W GRAM STAIN, RFLX TO RESP C: Special Requests: NORMAL

## 2022-11-01 LAB — GLUCOSE, CAPILLARY
Glucose-Capillary: 104 mg/dL — ABNORMAL HIGH (ref 70–99)
Glucose-Capillary: 124 mg/dL — ABNORMAL HIGH (ref 70–99)
Glucose-Capillary: 105 mg/dL — ABNORMAL HIGH (ref 70–99)
Glucose-Capillary: 122 mg/dL — ABNORMAL HIGH (ref 70–99)

## 2022-11-01 MED ORDER — SENNOSIDES-DOCUSATE SODIUM 8.6-50 MG PO TABS: 1.0000 | ORAL_TABLET | Freq: Every day | ORAL | Status: DC

## 2022-11-01 MED ORDER — AZITHROMYCIN 250 MG PO TABS
500.0000 mg | ORAL_TABLET | Freq: Every day | ORAL | Status: AC
  Administered 2022-11-01 – 2022-11-02 (×2): 500 mg via ORAL
  Filled 2022-11-01 (×3): qty 2

## 2022-11-01 MED ORDER — IPRATROPIUM-ALBUTEROL 0.5-2.5 (3) MG/3ML IN SOLN
3.0000 mL | RESPIRATORY_TRACT | Status: AC
  Filled 2022-11-01: qty 3

## 2022-11-01 MED ORDER — SENNOSIDES-DOCUSATE SODIUM 8.6-50 MG PO TABS
1.0000 | ORAL_TABLET | Freq: Two times a day (BID) | ORAL | Status: AC
  Administered 2022-11-01 – 2022-11-03 (×6): 1 via ORAL
  Filled 2022-11-01 (×6): qty 1

## 2022-11-01 NOTE — Progress Notes (Addendum)
PT demonstrated hands on understanding of Flutter device. Flutter used as 2000 CPT. PT was in bedside chair- therefore, Flutter used versus bed. Productive cough- small, yellow, thick. PT tolerated well.

## 2022-11-01 NOTE — Progress Notes (Signed)
PHARMACIST - PHYSICIAN COMMUNICATION DR:   [place] Crane CONCERNING: Antibiotic IV to Oral Route Change Policy  RECOMMENDATION: This patient is receiving azithromycin by the intravenous route.  Based on criteria approved by the Pharmacy and Therapeutics Committee, the antibiotic(s) is/are being converted to the equivalent oral dose form(s).   DESCRIPTION: These criteria include: Patient being treated for a respiratory tract infection, urinary tract infection, cellulitis or clostridium difficile associated diarrhea if on metronidazole The patient is not neutropenic and does not exhibit a GI malabsorption state The patient is eating (either orally or via tube) and/or has been taking other orally administered medications for a least 24 hours The patient is improving clinically and has a Tmax < 100.5  If you have questions about this conversion, please contact the Pharmacy Department  []$   [phone] )  [facility] []$   [phone] )  [facility] []$   [phone] )  [facility] []$   [phone] )  [facility] [x]$   [phone] )  [place], PharmD, BCPS 11/01/2022 .now

## 2022-11-01 NOTE — Progress Notes (Signed)
PT refused BiPAP 

## 2022-11-01 NOTE — Progress Notes (Signed)
PT is coughing violently therefore CPT not completed.

## 2022-11-01 NOTE — Progress Notes (Addendum)
PROGRESS NOTE    POLICARPIO CHAND  [id number] DOB: 1951-06-18 DOA: 10/29/2022 PCP: Leonard Downing, MD   Brief Narrative:  Jonathan [name] is a 72 y.o. male with medical history significant for COPD on room air at baseline, hypertension, hyperlipidemia, CAD status post CABG being admitted to the hospital with severe sepsis due to RU and RML community-acquired pneumonia complicated by acute exacerbation of COPD.  His oxygen saturation at urgent care was 84%.  He was placed on oxygen, transferred to the emergency department by EMS, he was given 3 DuoNebs, 125 mg Solu-Medrol and 2 g of magnesium en route.   Hospital course complicated by coughing spells and difficult to wean off O2 supplementation.  11/01/22: The patient was seen and examined at his bedside.  He is alert and oriented x 3.  Physical exam notable for coughing spells.  Cough is productive, improved with DuoNebs.  Assessment & Plan:   Severe sepsis and acute hypoxemic respiratory failure in the setting of multifocal pneumonia and COPD exacerbation: -Patient met criteria for severe sepsis upon admission. was tachycardic, tachypneic, leukocytosis and elevated lactic acid level.  Chest x-ray consistent with RU and RML pneumonia. -Initially was requiring BiPAP.  Currently on HFNC 4L to maintain O2 saturation greater than 90%.  On Rocephin and azithromycin.  Remained afebrile. -COVID, flu, RSV all negative.  Blood culture negative till now. -Received IV fluid boluses in ED.  Lactic acid trended down to normal.  IV fluid DC'd on 11/01/2022.  Procalcitonin level: 7.70.  -Strep pneumo antigen positive.  Urine Legionella is pending -Leukocytosis trending down.   -Continue Mucinex and Tessalon Perles for the cough.  Continue breathing treatments.   -Continue chest PT and 3% hypertonic neb treatment -Continue Solu-Medrol for COPD exacerbation. Follow sputum culture. Early mobilization, out of bed to chair every shift. Incentive  spirometry  Hypertension: Continue metoprolol.  Blood pressure is elevated.  Will add hydralazine as needed  Coronary artery disease s/p CABG: Continue home aspirin,  Plavix  Hyperglycemia: Last A1c 5.1 . -On sliding scale insulin.  Will continue to monitor blood sugar while on high-dose steroids.  Obesity with BMI of 32: Diet modification, exercise and weight loss recommended.  Resolved hyponatremia:, Post IV fluid hydration, DC'd on 11/01/2022.  Encourage increase oral protein calorie intake.  Constipation: No bowel movement since admission. -Continue stool softeners.  Bowel regimen in place.  Physical debility PT OT assessment Mobilize as tolerated.  DVT prophylaxis: Lovenox Code Status: DNR Family Communication:  None present at bedside.  Plan of care discussed with patient in length and he verbalized understanding and agreed with it. Disposition Plan: To be determined  Consultants:  None  Procedures:  None  Antimicrobials:  Rocephin Azithromycin  Status is: Inpatient     Objective: Vitals:   11/01/22 0752 11/01/22 0800 11/01/22 0900 11/01/22 1101  BP:  (!) 125/42 (!) 147/69   Pulse: 93 65 70   Resp: 19 14 20   $ Temp:  (!) 96.3 F (35.7 C)    TempSrc:  Axillary    SpO2: (!) 79% 96% 91% 92%  Weight:      Height:        Intake/Output Summary (Last 24 hours) at 11/01/2022 1224 Last data filed at 11/01/2022 0900 Gross per 24 hour  Intake 1475.97 ml  Output 700 ml  Net 775.97 ml   Filed Weights   10/29/22 1106 10/29/22 2212  Weight: 90.7 kg 94.9 kg    Examination:  General exam: Vital  well-nourished in no acute distress.  He is on high flow nasal cannula at 4 L.  Alert oriented x 3. Respiratory system: .  Diffuse rhonchi bilaterally.  Expiratory wheezing.   Cardiovascular system: Regular rate and rhythm no rubs or gallops.  No JVD or thyromegaly noted.  No pedal edema.  Gastrointestinal system: Obese nontender bowel sounds present.  Central nervous  system: Alert and oriented x 3.  No focal neurological deficits.   Extremities: Symmetric 5 x 5 power. Skin: No rashes lesions or ulcers    Data Reviewed: I have personally reviewed following labs and imaging studies  CBC: Recent Labs  Lab 10/29/22 1213 10/30/22 0257 10/31/22 1248  WBC 28.0* 26.9* 23.4*  NEUTROABS 26.1*  --   --   HGB 15.1 12.1* 15.5  HCT 46.4 37.2* 47.9  MCV 100.0 99.5 102.4*  PLT 189 166 [id number]   Basic Metabolic Panel: Recent Labs  Lab 10/29/22 1213 10/30/22 0257 10/31/22 0331 11/01/22 0330  NA 131* 132* 134* 139  K 3.6 3.7 4.8 4.3  CL 91* 100 107 106  CO2 25 23 17* 25  GLUCOSE 115* 141* 115* 126*  BUN 25* 24* 34* 40*  CREATININE 1.22 1.22 0.94 1.07  CALCIUM 8.1* 7.6* 7.7* 7.8*  MG  --   --   --  2.5*   GFR: Estimated Creatinine Clearance: 67.5 mL/min (by C-G formula based on SCr of 1.07 mg/dL). Liver Function Tests: Recent Labs  Lab 10/29/22 1213  AST 37  ALT 14  ALKPHOS 64  BILITOT 1.3*  PROT 7.1  ALBUMIN 2.9*   No results for input(s): "LIPASE", "AMYLASE" in the last 168 hours. No results for input(s): "AMMONIA" in the last 168 hours. Coagulation Profile: Recent Labs  Lab 10/29/22 1213  INR 1.4*   Cardiac Enzymes: No results for input(s): "CKTOTAL", "CKMB", "CKMBINDEX", "TROPONINI" in the last 168 hours. BNP (last 3 results) No results for input(s): "PROBNP" in the last 8760 hours. HbA1C: No results for input(s): "HGBA1C" in the last 72 hours. CBG: Recent Labs  Lab 10/31/22 0800 10/31/22 1155 10/31/22 1730 10/31/22 2119 11/01/22 0830  GLUCAP 104* 136* 119* 121* 104*   Lipid Profile: No results for input(s): "CHOL", "HDL", "LDLCALC", "TRIG", "CHOLHDL", "LDLDIRECT" in the last 72 hours. Thyroid Function Tests: No results for input(s): "TSH", "T4TOTAL", "FREET4", "T3FREE", "THYROIDAB" in the last 72 hours. Anemia Panel: No results for input(s): "VITAMINB12", "FOLATE", "FERRITIN", "TIBC", "IRON", "RETICCTPCT" in the last  72 hours. Sepsis Labs: Recent Labs  Lab 10/29/22 1716 10/29/22 2209 10/30/22 0723 10/30/22 0949 10/31/22 1248  PROCALCITON  --   --  7.71  --  5.22  LATICACIDVEN 4.4* 3.0* 1.5 1.6  --     Recent Results (from the past 240 hour(s))  Resp panel by RT-PCR (RSV, Flu A&B, Covid) Anterior Nasal Swab     Status: None   Collection Time: 10/29/22 11:08 AM   Specimen: Anterior Nasal Swab  Result Value Ref Range Status   SARS Coronavirus 2 by RT PCR NEGATIVE NEGATIVE Final    Comment: (NOTE) SARS-CoV-2 target nucleic acids are NOT DETECTED.  The SARS-CoV-2 RNA is generally detectable in upper respiratory specimens during the acute phase of infection. The lowest concentration of SARS-CoV-2 viral copies this assay can detect is 138 copies/mL. A negative result does not preclude SARS-Cov-2 infection and should not be used as the sole basis for treatment or other patient management decisions. A negative result may occur with  improper specimen collection/handling, submission of specimen other  than nasopharyngeal swab, presence of viral mutation(s) within the areas targeted by this assay, and inadequate number of viral copies(<138 copies/mL). A negative result must be combined with clinical observations, patient history, and epidemiological information. The expected result is Negative.  Fact Sheet for Patients:  EntrepreneurPulse.com.au  Fact Sheet for Healthcare Providers:  IncredibleEmployment.be  This test is no t yet approved or cleared by the [place] FDA and  has been authorized for detection and/or diagnosis of SARS-CoV-2 by FDA under an Emergency Use Authorization (EUA). This EUA will remain  in effect (meaning this test can be used) for the duration of the COVID-19 declaration under Section 564(b)(1) of the Act, 21 U.S.C.section 360bbb-3(b)(1), unless the authorization is terminated  or revoked sooner.       Influenza A by PCR  NEGATIVE NEGATIVE Final   Influenza B by PCR NEGATIVE NEGATIVE Final    Comment: (NOTE) The Xpert Xpress SARS-CoV-2/FLU/RSV plus assay is intended as an aid in the diagnosis of influenza from Nasopharyngeal swab specimens and should not be used as a sole basis for treatment. Nasal washings and aspirates are unacceptable for Xpert Xpress SARS-CoV-2/FLU/RSV testing.  Fact Sheet for Patients: EntrepreneurPulse.com.au  Fact Sheet for Healthcare Providers: IncredibleEmployment.be  This test is not yet approved or cleared by the [place] FDA and has been authorized for detection and/or diagnosis of SARS-CoV-2 by FDA under an Emergency Use Authorization (EUA). This EUA will remain in effect (meaning this test can be used) for the duration of the COVID-19 declaration under Section 564(b)(1) of the Act, 21 U.S.C. section 360bbb-3(b)(1), unless the authorization is terminated or revoked.     Resp Syncytial Virus by PCR NEGATIVE NEGATIVE Final    Comment: (NOTE) Fact Sheet for Patients: EntrepreneurPulse.com.au  Fact Sheet for Healthcare Providers: IncredibleEmployment.be  This test is not yet approved or cleared by the [place] FDA and has been authorized for detection and/or diagnosis of SARS-CoV-2 by FDA under an Emergency Use Authorization (EUA). This EUA will remain in effect (meaning this test can be used) for the duration of the COVID-19 declaration under Section 564(b)(1) of the Act, 21 U.S.C. section 360bbb-3(b)(1), unless the authorization is terminated or revoked.  Performed at [facility], [place] [address] [place], [place] [address]   Blood Culture (routine x 2)     Status: None (Preliminary result)   Collection Time: 10/29/22 12:01 PM   Specimen: BLOOD RIGHT HAND  Result Value Ref Range Status   Specimen Description   Final    BLOOD RIGHT HAND Performed at [facility], [place] [address], [place], [place] [address]    Special Requests   Final    BOTTLES DRAWN AEROBIC AND ANAEROBIC Blood Culture results may not be optimal due to an inadequate volume of blood received in culture bottles Performed at [place] [address], [place], [place] [address]    Culture   Final    NO GROWTH 3 DAYS Performed at [facility], [place] [address], [place], [place] [address]    Report Status PENDING  Incomplete  Blood Culture (routine x 2)     Status: None (Preliminary result)   Collection Time: 10/29/22 12:13 PM   Specimen: BLOOD  Result Value Ref Range Status   Specimen Description   Final    BLOOD RIGHT ANTECUBITAL Performed at [place] [address] [place],  [address]    Special Requests   Final  BOTTLES DRAWN AEROBIC AND ANAEROBIC Blood Culture adequate volume Performed at [place] [address] [place], [place] [address]    Culture   Final    NO GROWTH 3 DAYS Performed at [facility], [place] [address], [place], [place] [address]    Report Status PENDING  Incomplete  MRSA Next Gen by PCR, Nasal     Status: None   Collection Time: 10/29/22  9:43 PM   Specimen: Nasal Mucosa; Nasal Swab  Result Value Ref Range Status   MRSA by PCR Next Gen NOT DETECTED NOT DETECTED Final    Comment: (NOTE) The GeneXpert MRSA Assay (FDA approved for NASAL specimens only), is one component of a comprehensive MRSA colonization surveillance program. It is not intended to diagnose MRSA infection nor to guide or monitor treatment for MRSA infections. Test performance is not FDA approved in patients less than 33 years old. Performed at [facility], [place] [address], [place], [place] [address]   Expectorated Sputum Assessment w Gram Stain, Rflx to Resp Cult     Status: None   Collection Time: 11/01/22 11:56 AM   Specimen: Expectorated Sputum  Result Value Ref  Range Status   Specimen Description EXPECTORATED SPUTUM  Final   Special Requests Normal  Final   Sputum evaluation   Final    THIS SPECIMEN IS ACCEPTABLE FOR SPUTUM CULTURE Performed at [facility], [place] [address] [place], [place] [address]    Report Status 11/01/2022 FINAL  Final      Radiology Studies: No results found.  Scheduled Meds:  aspirin  81 mg Oral Daily   azithromycin  500 mg Oral Daily   Chlorhexidine Gluconate Cloth  6 each Topical Daily   clopidogrel  75 mg Oral Daily   dextromethorphan-guaiFENesin  1 tablet Oral BID   docusate sodium  100 mg Oral BID   enoxaparin (LOVENOX) injection  40 mg Subcutaneous Q24H   insulin aspart  0-15 Units Subcutaneous TID WC   insulin aspart  0-5 Units Subcutaneous QHS   ipratropium-albuterol  3 mL Nebulization QID   ipratropium-albuterol  3 mL Nebulization STAT   methylPREDNISolone (SOLU-MEDROL) injection  40 mg Intravenous Q8H   metoprolol succinate  37.5 mg Oral Daily   sodium chloride HYPERTONIC  4 mL Nebulization Daily   Continuous Infusions:  cefTRIAXone (ROCEPHIN)  IV 2 g (11/01/22 1146)     LOS: 3 days   Time spent: 35 minutes   Kayleen Memos, MD [facility]  If 7PM-7AM, please contact night-coverage [email or web address] 11/01/2022, 12:24 PM

## 2022-11-01 NOTE — Progress Notes (Signed)
  Transition of Care [facility]) Screening Note   Patient Details  Name: Jonathan [name] Date of Birth: [birth year]   Transition of [facility]) CM/SW Contact:    Vassie Moselle, LCSW Phone Number: 11/01/2022, 9:53 AM    Transition of Care Department [facility]) has reviewed patient and no TOC needs have been identified at this time. We will continue to monitor patient advancement through interdisciplinary progression rounds. If new patient transition needs arise, please place a TOC consult.

## 2022-11-01 NOTE — Progress Notes (Signed)
PT removed BiPAP and put on [place] 7L

## 2022-11-02 DIAGNOSIS — R6520 Severe sepsis without septic shock: Secondary | ICD-10-CM | POA: Diagnosis not present

## 2022-11-02 DIAGNOSIS — I10 Essential (primary) hypertension: Secondary | ICD-10-CM | POA: Insufficient documentation

## 2022-11-02 DIAGNOSIS — A491 Streptococcal infection, unspecified site: Secondary | ICD-10-CM | POA: Insufficient documentation

## 2022-11-02 DIAGNOSIS — A419 Sepsis, unspecified organism: Secondary | ICD-10-CM | POA: Diagnosis not present

## 2022-11-02 LAB — BASIC METABOLIC PANEL
Anion gap: 9 (ref 5–15)
BUN: 38 mg/dL — ABNORMAL HIGH (ref 8–23)
CO2: 24 mmol/L (ref 22–32)
Calcium: 8 mg/dL — ABNORMAL LOW (ref 8.9–10.3)
Chloride: 104 mmol/L (ref 98–111)
Creatinine, Ser: 1.03 mg/dL (ref 0.61–1.24)
GFR, Estimated: >60 mL/min (ref >60)
Glucose, Bld: 106 mg/dL — ABNORMAL HIGH (ref 70–99)
Potassium: 4.8 mmol/L (ref 3.5–5.1)
Sodium: 137 mmol/L (ref 135–145)

## 2022-11-02 LAB — GLUCOSE, CAPILLARY
Glucose-Capillary: 105 mg/dL — ABNORMAL HIGH (ref 70–99)
Glucose-Capillary: 123 mg/dL — ABNORMAL HIGH (ref 70–99)
Glucose-Capillary: 101 mg/dL — ABNORMAL HIGH (ref 70–99)
Glucose-Capillary: 122 mg/dL — ABNORMAL HIGH (ref 70–99)

## 2022-11-02 LAB — CBC
HCT: 45.7 % (ref 39.0–52.0)
Hemoglobin: 14.6 g/dL (ref 13.0–17.0)
MCH: 32.1 pg (ref 26.0–34.0)
MCHC: 31.9 g/dL (ref 30.0–36.0)
MCV: 100.4 fL — ABNORMAL HIGH (ref 80.0–100.0)
Platelets: 277 10*3/uL (ref 150–400)
RBC: 4.55 MIL/uL (ref 4.22–5.81)
RDW: 14.6 % (ref 11.5–15.5)
WBC: 9.9 10*3/uL (ref 4.0–10.5)
nRBC: 0 % (ref 0.0–0.2)

## 2022-11-02 LAB — LEGIONELLA PNEUMOPHILA SEROGP 1 UR AG: L. pneumophila Serogp 1 Ur Ag: NEGATIVE

## 2022-11-02 MED ORDER — SALINE SPRAY 0.65 % NA SOLN
1.0000 | NASAL | Status: DC | PRN
  Administered 2022-11-02: 1 via NASAL
  Filled 2022-11-02: qty 44

## 2022-11-02 MED ORDER — HYDRALAZINE HCL 25 MG PO TABS
25.0000 mg | ORAL_TABLET | Freq: Three times a day (TID) | ORAL | Status: DC
  Administered 2022-11-02 – 2022-11-07 (×15): 25 mg via ORAL
  Filled 2022-11-02 (×15): qty 1

## 2022-11-02 MED ORDER — MOMETASONE FURO-FORMOTEROL FUM 200-5 MCG/ACT IN AERO
2.0000 | INHALATION_SPRAY | Freq: Two times a day (BID) | RESPIRATORY_TRACT | Status: DC
  Administered 2022-11-02 – 2022-11-07 (×11): 2 via RESPIRATORY_TRACT
  Filled 2022-11-02: qty 8.8

## 2022-11-02 NOTE — Progress Notes (Signed)
Report given. Pt transferred to 4th floor. Pt stable

## 2022-11-02 NOTE — TOC Progression Note (Addendum)
Transition of Care [facility]) - Progression Note    Patient Details  Name: Jonathan [name] MRN: [id number] Date of Birth: Nov 17, 1950  Transition of Care [facility]) CM/SW Contact  Loletha Grayer Beverely Pace, RN Phone Number: 11/02/2022, 1:57 PM  Clinical Narrative:     Patient is a 72 yr old male adm with Sepsis and COPD exacerbation. Case manager spoke with patient's wife, (he's limited in  talking because of coughing). Discussed recommendation for Home Health therapies. She states she would like to use agency that he had in 2022. CM reviewed his chart, they used [facility], now [facility]. CM called referral to [facility], Liaison with [facility].Confirmed patient's home address, we have PO Box listed on demographics- [address], [place] ,[place].  TOC  Team will continue to follow for needs.   Expected Discharge Plan: [place] Barriers to Discharge: Continued Medical Work up  Expected Discharge Plan and Services In-house Referral: NA Discharge Planning Services: CM Consult Post Acute Care Choice: [place] arrangements for the past 2 months: Single Family Home                 DME Arranged: N/A DME Agency: NA       HH Arranged: PT, OT   Date HH Agency Contacted: 11/02/22 Time [place]: [id number] Representative spoke with at [place]: kelly   Social Determinants of Health ([place]) Interventions SDOH Screenings   Food Insecurity: No Food Insecurity (10/29/2022)  Housing: Low Risk  (10/29/2022)  Transportation Needs: No Transportation Needs (10/29/2022)  Utilities: Not At Risk (10/29/2022)  Tobacco Use: Medium Risk (10/29/2022)    Readmission Risk Interventions    11/01/2022    9:52 AM 10/25/2020   10:55 AM  Readmission Risk Prevention Plan  Transportation Screening Complete Complete  PCP or Specialist Appt within 5-7 Days  Not Complete  Not Complete comments  pt going to [facility]  PCP or Specialist Appt within 3-5 Days Complete    Home Care Screening  Complete  Medication Review (RN CM)  Complete  HRI or Home Care Consult Complete   Social Work Consult for [place] Planning/Counseling Complete   Palliative Care Screening Not Applicable   Medication Review Press photographer) Complete

## 2022-11-02 NOTE — Plan of Care (Signed)

## 2022-11-02 NOTE — Evaluation (Signed)
Physical Therapy Evaluation Patient Details Name: Jonathan [name] MRN: [id number] DOB: [birth year] Today's Date: 11/02/2022  History of Present Illness  Pt is 72 yo male admitted 10/29/22 with sepsis, resp failure, PNE, and COPD exacerbation.  Pt with hx including but not limited to COPD on room air at baseline, hypertension, hyperlipidemia, CAD status post CABG  Clinical Impression  Pt admitted with above diagnosis. At baseline pt is independent and not on home O2.  Today, pt was min guard level to take a few steps to the chair.  He was on 4 L O2 with VSS.  Pt not able to go further due to coughing spell then fatigue.  Pt likely to be able to progress to HHPT level with supervision. Pt currently with functional limitations due to the deficits listed below (see PT Problem List). Pt will benefit from skilled PT to increase their independence and safety with mobility to allow discharge to the venue listed below.          Recommendations for follow up therapy are one component of a multi-disciplinary discharge planning process, led by the attending physician.  Recommendations may be updated based on patient status, additional functional criteria and insurance authorization.  Follow Up Recommendations Home health PT      Assistance Recommended at Discharge Intermittent Supervision/Assistance  Patient can return home with the following  A little help with walking and/or transfers;A little help with bathing/dressing/bathroom;Assistance with cooking/housework    Equipment Recommendations None recommended by PT  Recommendations for Other Services       Functional Status Assessment Patient has had a recent decline in their functional status and demonstrates the ability to make significant improvements in function in a reasonable and predictable amount of time.     Precautions / Restrictions Precautions Precautions: Fall      Mobility  Bed Mobility Overal bed mobility: Needs Assistance Bed  Mobility: Supine to Sit     Supine to sit: Supervision, HOB elevated     General bed mobility comments: with bed rail and increased time    Transfers Overall transfer level: Needs assistance Equipment used: 1 person hand held assist Transfers: Sit to/from Stand, Bed to chair/wheelchair/BSC Sit to Stand: Min guard   Step pivot transfers: Min guard       General transfer comment: Min guard to stabilize with pt holding bed rail or arm rest of chair.  Only took steps to chair limited by coughing    Ambulation/Gait                  Stairs            Wheelchair Mobility    Modified Rankin (Stroke Patients Only)       Balance Overall balance assessment: Needs assistance Sitting-balance support: No upper extremity supported Sitting balance-Leahy Scale: Good     Standing balance support: Single extremity supported Standing balance-Leahy Scale: Poor Standing balance comment: held bed rail and min guard                             Pertinent Vitals/Pain Pain Assessment Pain Assessment: No/denies pain    Home Living Family/patient expects to be discharged to:: Private residence Living Arrangements: Spouse/significant other Available Help at Discharge: Family;Available 24 hours/day Type of Home: House Home Access: Stairs to enter Entrance Stairs-Rails: None Entrance Stairs-Number of Steps: 2 small   Home Layout: One level Home Equipment: Conservation officer, nature (2 wheels);Rollator (4 wheels);Cane -  single point;Crutches      Prior Function Prior Level of Function : Independent/Modified Independent;Driving             Mobility Comments: could ambulate in community without AD; reports walking at least an hour a day ADLs Comments: independent adls and shared duties with iadls     Hand Dominance        Extremity/Trunk Assessment   Upper Extremity Assessment Upper Extremity Assessment: LUE deficits/detail LUE Deficits / Details: Noted in  wrist splint - reports he had fracture several weeks ago and was in cast now in splint.  Pt reports he is allowed to use hand for transfers/RW/etc    Lower Extremity Assessment Lower Extremity Assessment: Overall WFL for tasks assessed (ROM WFL; MMT 5/5)    Cervical / Trunk Assessment Cervical / Trunk Assessment: Normal  Communication   Communication: No difficulties  Cognition Arousal/Alertness: Awake/alert Behavior During Therapy: WFL for tasks assessed/performed Overall Cognitive Status: Within Functional Limits for tasks assessed                                          General Comments General comments (skin integrity, edema, etc.): On 4 L O2.  Sats 94% rest, 93% transfers , 88% when coughing.  Other VSS    Exercises     Assessment/Plan    PT Assessment Patient needs continued PT services  PT Problem List Decreased strength;Cardiopulmonary status limiting activity;Decreased range of motion;Decreased activity tolerance;Decreased balance;Decreased mobility;Decreased knowledge of precautions;Decreased knowledge of use of DME       PT Treatment Interventions DME instruction;Therapeutic exercise;Gait training;Functional mobility training;Therapeutic activities;Patient/family education;Balance training;Stair training    PT Goals (Current goals can be found in the Care Plan section)  Acute Rehab PT Goals Patient Stated Goal: return home PT Goal Formulation: With patient Time For Goal Achievement: 11/17/22 Potential to Achieve Goals: Good    Frequency Min 3X/week     Co-evaluation               AM-PAC PT "6 Clicks" Mobility  Outcome Measure Help needed turning from your back to your side while in a flat bed without using bedrails?: A Little Help needed moving from lying on your back to sitting on the side of a flat bed without using bedrails?: A Little Help needed moving to and from a bed to a chair (including a wheelchair)?: A Little Help needed  standing up from a chair using your arms (e.g., wheelchair or bedside chair)?: A Little Help needed to walk in hospital room?: A Little Help needed climbing 3-5 steps with a railing? : A Lot 6 Click Score: 17    End of Session Equipment Utilized During Treatment: Gait belt Activity Tolerance: Patient tolerated treatment well Patient left: with call bell/phone within reach;with chair alarm set;in chair Nurse Communication: Mobility status PT Visit Diagnosis: Other abnormalities of gait and mobility (R26.89);Muscle weakness (generalized) (M62.81)    Time: [id number] PT Time Calculation (min) (ACUTE ONLY): 20 min   Charges:   PT Evaluation $PT Eval Moderate Complexity: 1 Mod          Isrrael Fluckiger, PT Acute Rehab [facility] [phone]   Karlton Lemon 11/02/2022, 10:06 AM

## 2022-11-02 NOTE — Progress Notes (Signed)
PROGRESS NOTE    Jonathan [name]  [id number] DOB: May 15, 1951 DOA: 10/29/2022 PCP: Leonard Downing, MD     Brief Narrative:  Jonathan [name] is a 72 y.o. male with medical history significant for COPD on room air at baseline, hypertension, hyperlipidemia, CAD status post CABG being admitted to the hospital with severe sepsis due to RU and RML community-acquired pneumonia complicated by acute exacerbation of COPD.  His oxygen saturation at urgent care was 84%.  He was placed on oxygen, transferred to the emergency department by EMS, he was given 3 DuoNebs, 125 mg Solu-Medrol and 2 g of magnesium en route. He was initially requiring BiPAP, weaned down to 4 L high flow nasal cannula O2.  New events last 24 hours / Subjective: Patient states that he continues to have cough, continues to feel some shortness of breath.  Eating breakfast this morning.  Did not get much rest last night.  Assessment & Plan:  Principal Problem:   Severe sepsis ([place]) Active Problems:   Essential hypertension, benign   Hyperlipidemia   Obesity (BMI 30.0-34.9)   S/P CABG x 4   COPD with acute exacerbation (HCC)   Leukocytosis   CAP (community acquired pneumonia)   Lactic acid acidosis   Hyponatremia   Streptococcus pneumoniae   HTN (hypertension)   Severe sepsis secondary to multifocal pneumonia, strep pneumo -Severe sepsis present on admission -Continue Rocephin  Acute hypoxemic respiratory failure secondary to pneumonia, COPD exacerbation -Currently requiring 5 L nasal cannula O2 -Continue Solu-Medrol, breathing treatment, chest PT -Added azithromycin  Hypertension -Metoprolol, hydralazine  CAD status post CABG -Aspirin, Plavix  Obesity -BMI 32  DVT prophylaxis:  enoxaparin (LOVENOX) injection 40 mg Start: 10/29/22 2200 SCDs Start: 10/29/22 1451  Code Status: DNR Family Communication: None at bedside Disposition Plan:  Status is: Inpatient Remains inpatient appropriate because:  respiratory failure     Antimicrobials:  Anti-infectives (From admission, onward)    Start     Dose/Rate Route Frequency Ordered Stop   11/01/22 1000  azithromycin (ZITHROMAX) tablet 500 mg        500 mg Oral Daily 11/01/22 0856 11/03/22 0959   10/29/22 1200  cefTRIAXone (ROCEPHIN) 2 g in sodium chloride 0.9 % 100 mL IVPB        2 g 200 mL/hr over 30 Minutes Intravenous Every 24 hours 10/29/22 1149 11/03/22 1159   10/29/22 1200  azithromycin (ZITHROMAX) 500 mg in sodium chloride 0.9 % 250 mL IVPB  Status:  Discontinued        500 mg 250 mL/hr over 60 Minutes Intravenous Every 24 hours 10/29/22 1149 11/01/22 0855        Objective: Vitals:   11/02/22 0700 11/02/22 0800 11/02/22 0911 11/02/22 1000  BP: (!) 167/62 (!) 191/169    Pulse: (!) 54 65  61  Resp: 20 19  18  $ Temp:  (!) 97.3 F (36.3 C)    TempSrc:  Axillary    SpO2: 95% 96% 97% 95%  Weight:      Height:        Intake/Output Summary (Last 24 hours) at 11/02/2022 1132 Last data filed at 11/02/2022 0900 Gross per 24 hour  Intake 350.06 ml  Output 1325 ml  Net -974.94 ml   Filed Weights   10/29/22 1106 10/29/22 2212  Weight: 90.7 kg 94.9 kg    Examination:  General exam: Appears calm and comfortable  Respiratory system: Bilateral crackles without wheeze Cardiovascular system: S1 & S2 heard, RRR. No murmurs.  No pedal edema. Gastrointestinal system: Abdomen is nondistended, soft and nontender. Normal bowel sounds heard. Central nervous system: Alert and oriented. No focal neurological deficits. Speech clear.  Extremities: Symmetric in appearance  Skin: No rashes, lesions or ulcers on exposed skin  Psychiatry: Judgement and insight appear normal. Mood & affect appropriate.   Data Reviewed: I have personally reviewed following labs and imaging studies  CBC: Recent Labs  Lab 10/29/22 1213 10/30/22 0257 10/31/22 1248 11/02/22 0317  WBC 28.0* 26.9* 23.4* 9.9  NEUTROABS 26.1*  --   --   --   HGB 15.1 12.1*  15.5 14.6  HCT 46.4 37.2* 47.9 45.7  MCV 100.0 99.5 102.4* 100.4*  PLT 189 166 214 [id number]   Basic Metabolic Panel: Recent Labs  Lab 10/29/22 1213 10/30/22 0257 10/31/22 0331 11/01/22 0330 11/02/22 0317  NA 131* 132* 134* 139 137  K 3.6 3.7 4.8 4.3 4.8  CL 91* 100 107 106 104  CO2 25 23 17* 25 24  GLUCOSE 115* 141* 115* 126* 106*  BUN 25* 24* 34* 40* 38*  CREATININE 1.22 1.22 0.94 1.07 1.03  CALCIUM 8.1* 7.6* 7.7* 7.8* 8.0*  MG  --   --   --  2.5*  --    GFR: Estimated Creatinine Clearance: 70.1 mL/min (by C-G formula based on SCr of 1.03 mg/dL). Liver Function Tests: Recent Labs  Lab 10/29/22 1213  AST 37  ALT 14  ALKPHOS 64  BILITOT 1.3*  PROT 7.1  ALBUMIN 2.9*   No results for input(s): "LIPASE", "AMYLASE" in the last 168 hours. No results for input(s): "AMMONIA" in the last 168 hours. Coagulation Profile: Recent Labs  Lab 10/29/22 1213  INR 1.4*   Cardiac Enzymes: No results for input(s): "CKTOTAL", "CKMB", "CKMBINDEX", "TROPONINI" in the last 168 hours. BNP (last 3 results) No results for input(s): "PROBNP" in the last 8760 hours. HbA1C: No results for input(s): "HGBA1C" in the last 72 hours. CBG: Recent Labs  Lab 11/01/22 0830 11/01/22 1148 11/01/22 1656 11/01/22 2139 11/02/22 0745  GLUCAP 104* 124* 105* 122* 105*   Lipid Profile: No results for input(s): "CHOL", "HDL", "LDLCALC", "TRIG", "CHOLHDL", "LDLDIRECT" in the last 72 hours. Thyroid Function Tests: No results for input(s): "TSH", "T4TOTAL", "FREET4", "T3FREE", "THYROIDAB" in the last 72 hours. Anemia Panel: No results for input(s): "VITAMINB12", "FOLATE", "FERRITIN", "TIBC", "IRON", "RETICCTPCT" in the last 72 hours. Sepsis Labs: Recent Labs  Lab 10/29/22 1716 10/29/22 2209 10/30/22 0723 10/30/22 0949 10/31/22 1248  PROCALCITON  --   --  7.71  --  5.22  LATICACIDVEN 4.4* 3.0* 1.5 1.6  --     Recent Results (from the past 240 hour(s))  Resp panel by RT-PCR (RSV, Flu A&B, Covid)  Anterior Nasal Swab     Status: None   Collection Time: 10/29/22 11:08 AM   Specimen: Anterior Nasal Swab  Result Value Ref Range Status   SARS Coronavirus 2 by RT PCR NEGATIVE NEGATIVE Final    Comment: (NOTE) SARS-CoV-2 target nucleic acids are NOT DETECTED.  The SARS-CoV-2 RNA is generally detectable in upper respiratory specimens during the acute phase of infection. The lowest concentration of SARS-CoV-2 viral copies this assay can detect is 138 copies/mL. A negative result does not preclude SARS-Cov-2 infection and should not be used as the sole basis for treatment or other patient management decisions. A negative result may occur with  improper specimen collection/handling, submission of specimen other than nasopharyngeal swab, presence of viral mutation(s) within the areas targeted by this assay,  and inadequate number of viral copies(<138 copies/mL). A negative result must be combined with clinical observations, patient history, and epidemiological information. The expected result is Negative.  Fact Sheet for Patients:  EntrepreneurPulse.com.au  Fact Sheet for Healthcare Providers:  IncredibleEmployment.be  This test is no t yet approved or cleared by the [place] FDA and  has been authorized for detection and/or diagnosis of SARS-CoV-2 by FDA under an Emergency Use Authorization (EUA). This EUA will remain  in effect (meaning this test can be used) for the duration of the COVID-19 declaration under Section 564(b)(1) of the Act, 21 U.S.C.section 360bbb-3(b)(1), unless the authorization is terminated  or revoked sooner.       Influenza A by PCR NEGATIVE NEGATIVE Final   Influenza B by PCR NEGATIVE NEGATIVE Final    Comment: (NOTE) The Xpert Xpress SARS-CoV-2/FLU/RSV plus assay is intended as an aid in the diagnosis of influenza from Nasopharyngeal swab specimens and should not be used as a sole basis for treatment. Nasal washings  and aspirates are unacceptable for Xpert Xpress SARS-CoV-2/FLU/RSV testing.  Fact Sheet for Patients: EntrepreneurPulse.com.au  Fact Sheet for Healthcare Providers: IncredibleEmployment.be  This test is not yet approved or cleared by the [place] FDA and has been authorized for detection and/or diagnosis of SARS-CoV-2 by FDA under an Emergency Use Authorization (EUA). This EUA will remain in effect (meaning this test can be used) for the duration of the COVID-19 declaration under Section 564(b)(1) of the Act, 21 U.S.C. section 360bbb-3(b)(1), unless the authorization is terminated or revoked.     Resp Syncytial Virus by PCR NEGATIVE NEGATIVE Final    Comment: (NOTE) Fact Sheet for Patients: EntrepreneurPulse.com.au  Fact Sheet for Healthcare Providers: IncredibleEmployment.be  This test is not yet approved or cleared by the [place] FDA and has been authorized for detection and/or diagnosis of SARS-CoV-2 by FDA under an Emergency Use Authorization (EUA). This EUA will remain in effect (meaning this test can be used) for the duration of the COVID-19 declaration under Section 564(b)(1) of the Act, 21 U.S.C. section 360bbb-3(b)(1), unless the authorization is terminated or revoked.  Performed at [facility], [place] [address], [place], [place] [address]   Blood Culture (routine x 2)     Status: None (Preliminary result)   Collection Time: 10/29/22 12:01 PM   Specimen: BLOOD RIGHT HAND  Result Value Ref Range Status   Specimen Description   Final    BLOOD RIGHT HAND Performed at [facility], [place] [address], [place], [place] [address]    Special Requests   Final    BOTTLES DRAWN AEROBIC AND ANAEROBIC Blood Culture results may not be optimal due to an inadequate volume of blood received in culture bottles Performed at [place] [address],  [place], [place] [address]    Culture   Final    NO GROWTH 4 DAYS Performed at [facility], [place] [address], [place], [place] [address]    Report Status PENDING  Incomplete  Blood Culture (routine x 2)     Status: None (Preliminary result)   Collection Time: 10/29/22 12:13 PM   Specimen: BLOOD  Result Value Ref Range Status   Specimen Description   Final    BLOOD RIGHT ANTECUBITAL Performed at [place] [address] [place], [place] [address]    Special Requests   Final    BOTTLES DRAWN AEROBIC AND ANAEROBIC Blood Culture adequate volume Performed at [facility], [place] [address] [place], [place] [address]    Culture   Final    NO GROWTH 4 DAYS Performed at [facility], [place] [address] [place], [place] [address]    Report Status PENDING  Incomplete  MRSA Next Gen by PCR, Nasal     Status: None   Collection Time: 10/29/22  9:43 PM   Specimen: Nasal Mucosa; Nasal Swab  Result Value Ref Range Status   MRSA by PCR Next Gen NOT DETECTED NOT DETECTED Final    Comment: (NOTE) The GeneXpert MRSA Assay (FDA approved for NASAL specimens only), is one component of a comprehensive MRSA colonization surveillance program. It is not intended to diagnose MRSA infection nor to guide or monitor treatment for MRSA infections. Test performance is not FDA approved in patients less than 89 years old. Performed at [facility], [place] [address] [place], [place] [address]   Expectorated Sputum Assessment w Gram Stain, Rflx to Resp Cult     Status: None   Collection Time: 11/01/22 11:56 AM   Specimen: Expectorated Sputum  Result Value Ref Range Status   Specimen Description EXPECTORATED SPUTUM  Final   Special Requests Normal  Final   Sputum evaluation   Final    THIS SPECIMEN IS ACCEPTABLE FOR SPUTUM CULTURE Performed at [facility], [place] [address] [place], [place] [address]    Report Status 11/01/2022  FINAL  Final  Culture, Respiratory w Gram Stain     Status: None (Preliminary result)   Collection Time: 11/01/22 11:56 AM  Result Value Ref Range Status   Specimen Description   Final    EXPECTORATED SPUTUM Performed at [place] [address], [place], [place] [address]    Special Requests   Final    Normal Reflexed from [id number] Performed at [facility], [place] [address] [place], [place] [address]    Gram Stain NO WBC SEEN RARE GRAM POSITIVE COCCI   Final   Culture   Final    CULTURE REINCUBATED FOR BETTER GROWTH Performed at [facility], [place] [address] [place], [place] [address]    Report Status PENDING  Incomplete      Radiology Studies: No results found.    Scheduled Meds:  aspirin  81 mg Oral Daily   azithromycin  500 mg Oral Daily   Chlorhexidine Gluconate Cloth  6 each Topical Daily   clopidogrel  75 mg Oral Daily   dextromethorphan-guaiFENesin  1 tablet Oral BID   enoxaparin (LOVENOX) injection  40 mg Subcutaneous Q24H   insulin aspart  0-15 Units Subcutaneous TID WC   insulin aspart  0-5 Units Subcutaneous QHS   ipratropium-albuterol  3 mL Nebulization QID   methylPREDNISolone (SOLU-MEDROL) injection  40 mg Intravenous Q8H   metoprolol succinate  37.5 mg Oral Daily   mometasone-formoterol  2 puff Inhalation BID   senna-docusate  1 tablet Oral BID   Continuous Infusions:  cefTRIAXone (ROCEPHIN)  IV Stopped (11/01/22 1217)     LOS: 4 days   Time spent: 25 minutes   Dessa Phi, DO [facility] 11/02/2022, 11:32 AM   Available via Epic secure chat 7am-7pm After these hours, please refer to coverage provider listed on [email or web address]

## 2022-11-02 NOTE — Evaluation (Signed)
Occupational Therapy Evaluation Patient Details Name: Jonathan [name] MRN: [id number] DOB: 10/31/1950 Today's Date: 11/02/2022   History of Present Illness Pt is 72 yo male admitted 10/29/22 with sepsis, resp failure, PNE, and COPD exacerbation.  Pt with hx including but not limited to COPD on room air at baseline, hypertension, hyperlipidemia, CAD status post CABG   Clinical Impression   Jonathan [name] is a 72 year old man who presents on 6 L HFNC with decreased cardiopulmonary endurance. On evaluation he is able to stand and march in place and exhibits normal upper body strength. At baseline he uses AE for LB dressing but otherwise is independent. He is overall min -mod assist for ADLs at this time but suspect he will improve quickly. Will follow acutely but do not think he will have OT needs at discharge. He has assist of wife if needed.       Recommendations for follow up therapy are one component of a multi-disciplinary discharge planning process, led by the attending physician.  Recommendations may be updated based on patient status, additional functional criteria and insurance authorization.   Follow Up Recommendations  No OT follow up     Assistance Recommended at Discharge PRN  Patient can return home with the following Assistance with cooking/housework;Help with stairs or ramp for entrance    Functional Status Assessment  Patient has had a recent decline in their functional status and demonstrates the ability to make significant improvements in function in a reasonable and predictable amount of time.  Equipment Recommendations  None recommended by OT    Recommendations for Other Services       Precautions / Restrictions Precautions Precautions: Fall Restrictions Weight Bearing Restrictions: No      Mobility Bed Mobility               General bed mobility comments: up in recliner    Transfers Overall transfer level: Needs assistance Equipment used: 1 person  hand held assist Transfers: Sit to/from Stand Sit to Stand: Min guard           General transfer comment: Can stand in place and march without AD. No increased WOB or complaints of SHOB. [place] on 6 L HFNC.      Balance Overall balance assessment: No apparent balance deficits (not formally assessed)                                         ADL either performed or assessed with clinical judgement   ADL Overall ADL's : Needs assistance/impaired Eating/Feeding: Independent   Grooming: Set up;Sitting   Upper Body Bathing: Set up;Sitting   Lower Body Bathing: Minimal assistance;Sit to/from stand   Upper Body Dressing : Set up;Sitting   Lower Body Dressing: Moderate assistance;Sit to/from stand   Toilet Transfer: Min guard;BSC/3in1   Toileting- Water quality scientist and Hygiene: Minimal assistance;Sit to/from stand       Functional mobility during ADLs: Min guard       Vision Baseline Vision/History: 1 Wears glasses Patient Visual Report: No change from baseline       Perception     Praxis      Pertinent Vitals/Pain Pain Assessment Pain Assessment: No/denies pain     Hand Dominance Right   Extremity/Trunk Assessment Upper Extremity Assessment Upper Extremity Assessment: Overall WFL for tasks assessed LUE Deficits / Details: Noted in wrist splint - reports he had  fracture several weeks ago and was in cast now in splint.  Pt reports he is allowed to use hand for transfers/RW/etc   Lower Extremity Assessment Lower Extremity Assessment: Defer to PT evaluation   Cervical / Trunk Assessment Cervical / Trunk Assessment: Normal   Communication Communication Communication: No difficulties   Cognition Arousal/Alertness: Awake/alert Behavior During Therapy: WFL for tasks assessed/performed Overall Cognitive Status: Within Functional Limits for tasks assessed                                       General Comments        Exercises     Shoulder Instructions      Home Living Family/patient expects to be discharged to:: Private residence Living Arrangements: Spouse/significant other Available Help at Discharge: Family;Available 24 hours/day Type of Home: House Home Access: Stairs to enter [employer] of Steps: 2 small Entrance Stairs-Rails: None Home Layout: One level     Bathroom Shower/Tub: Walk-in shower         Home Equipment: Conservation officer, nature (2 wheels);Rollator (4 wheels);Cane - single point;Crutches          Prior Functioning/Environment Prior Level of Function : Independent/Modified Independent;Driving             Mobility Comments: could ambulate in community without AD; reports walking at least an hour a day ADLs Comments: independent adls with use of AE and shared duties with iadls        OT Problem List: Cardiopulmonary status limiting activity;Decreased activity tolerance      OT Treatment/Interventions: Self-care/ADL training;DME and/or AE instruction;Patient/family education    OT Goals(Current goals can be found in the care plan section) Acute Rehab OT Goals Patient Stated Goal: walk to bathroom OT Goal Formulation: With patient Time For Goal Achievement: 11/16/22 Potential to Achieve Goals: Good  OT Frequency: Min 2X/week    Co-evaluation              AM-PAC OT "6 Clicks" Daily Activity     Outcome Measure Help from another person eating meals?: None Help from another person taking care of personal grooming?: A Little Help from another person toileting, which includes using toliet, bedpan, or urinal?: A Little Help from another person bathing (including washing, rinsing, drying)?: A Little Help from another person to put on and taking off regular upper body clothing?: A Little Help from another person to put on and taking off regular lower body clothing?: A Lot 6 Click Score: 18   End of Session Equipment Utilized During Treatment:  Oxygen Nurse Communication: Mobility status  Activity Tolerance: Patient tolerated treatment well Patient left: in chair;with call bell/phone within reach  OT Visit Diagnosis: Muscle weakness (generalized) (M62.81)                Time: 1431-1440 OT Time Calculation (min): 9 min Charges:  OT General Charges $OT Visit: 1 Visit OT Evaluation $OT Eval Low Complexity: 1 Low  Gustavo Lah, OTR/L [place]  Office [phone]   Lenward Chancellor 11/02/2022, 4:16 PM

## 2022-11-03 ENCOUNTER — Inpatient Hospital Stay (HOSPITAL_COMMUNITY): Payer: Medicare Other

## 2022-11-03 DIAGNOSIS — A419 Sepsis, unspecified organism: Secondary | ICD-10-CM | POA: Diagnosis not present

## 2022-11-03 DIAGNOSIS — R6520 Severe sepsis without septic shock: Secondary | ICD-10-CM | POA: Diagnosis not present

## 2022-11-03 LAB — CULTURE, RESPIRATORY W GRAM STAIN
Culture: NORMAL
Gram Stain: NONE SEEN
Special Requests: NORMAL

## 2022-11-03 LAB — CULTURE, BLOOD (ROUTINE X 2)
Culture: NO GROWTH
Culture: NO GROWTH
Special Requests: ADEQUATE

## 2022-11-03 LAB — GLUCOSE, CAPILLARY
Glucose-Capillary: 114 mg/dL — ABNORMAL HIGH (ref 70–99)
Glucose-Capillary: 127 mg/dL — ABNORMAL HIGH (ref 70–99)
Glucose-Capillary: 89 mg/dL (ref 70–99)
Glucose-Capillary: 125 mg/dL — ABNORMAL HIGH (ref 70–99)

## 2022-11-03 MED ORDER — FUROSEMIDE 10 MG/ML IJ SOLN
40.0000 mg | Freq: Once | INTRAMUSCULAR | Status: AC
  Administered 2022-11-03: 40 mg via INTRAVENOUS
  Filled 2022-11-03: qty 4

## 2022-11-03 MED ORDER — IPRATROPIUM-ALBUTEROL 0.5-2.5 (3) MG/3ML IN SOLN
3.0000 mL | Freq: Three times a day (TID) | RESPIRATORY_TRACT | Status: DC
  Administered 2022-11-03 – 2022-11-07 (×12): 3 mL via RESPIRATORY_TRACT
  Filled 2022-11-03 (×12): qty 3

## 2022-11-03 MED ORDER — METOPROLOL SUCCINATE ER 25 MG PO TB24
25.0000 mg | ORAL_TABLET | Freq: Every day | ORAL | Status: DC
  Administered 2022-11-03 – 2022-11-06 (×4): 25 mg via ORAL
  Filled 2022-11-03 (×5): qty 1

## 2022-11-03 MED ORDER — METHYLPREDNISOLONE SODIUM SUCC 40 MG IJ SOLR
40.0000 mg | Freq: Two times a day (BID) | INTRAMUSCULAR | Status: DC
  Administered 2022-11-03 – 2022-11-05 (×4): 40 mg via INTRAVENOUS
  Filled 2022-11-03 (×4): qty 1

## 2022-11-03 NOTE — Plan of Care (Signed)
  Problem: Education: Goal: Knowledge of General Education information will improve Description Including pain rating scale, medication(s)/side effects and non-pharmacologic comfort measures Outcome: Progressing   Problem: Health Behavior/Discharge Planning: Goal: Ability to manage health-related needs will improve Outcome: Progressing   

## 2022-11-03 NOTE — Progress Notes (Signed)
Mobility Specialist - Progress Note   11/03/22 1333  Mobility  Activity Transferred from bed to chair  Level of Assistance Standby assist, set-up cues, supervision of patient - no hands on  Activity Response Tolerated fair  $Mobility charge 1 Mobility   Pt was found in bed wanting to transfer to recliner chair. Pt able to complete transfer with supervision and has persistent coughing with session. Was left in room with RN and wife.  Ferd Hibbs Mobility Specialist

## 2022-11-03 NOTE — Progress Notes (Signed)
PROGRESS NOTE    Jonathan [name]  [id number] DOB: 1951-03-02 DOA: 10/29/2022 PCP: Leonard Downing, MD     Brief Narrative:  Jonathan [name] is a 72 y.o. male with medical history significant for COPD on room air at baseline, hypertension, hyperlipidemia, CAD status post CABG being admitted to the hospital with severe sepsis due to RU and RML community-acquired pneumonia complicated by acute exacerbation of COPD.  His oxygen saturation at urgent care was 84%.  He was placed on oxygen, transferred to the emergency department by EMS, he was given 3 DuoNebs, 125 mg Solu-Medrol and 2 g of magnesium en route. He was initially requiring BiPAP, weaned down to 4 L high flow nasal cannula O2.  New events last 24 hours / Subjective: Admits to not feeling well, continues to have a cough.  Very weak today.  Assessment & Plan:  Principal Problem:   Severe sepsis ([place]) Active Problems:   Essential hypertension, benign   Hyperlipidemia   Obesity (BMI 30.0-34.9)   S/P CABG x 4   COPD with acute exacerbation (HCC)   Leukocytosis   CAP (community acquired pneumonia)   Lactic acid acidosis   Hyponatremia   Streptococcus pneumoniae   HTN (hypertension)   Severe sepsis secondary to multifocal pneumonia, strep pneumo -Severe sepsis present on admission -Completed antibiotic  Acute hypoxemic respiratory failure secondary to pneumonia, COPD exacerbation -Currently requiring 6 L nasal cannula O2 -Continue Solu-Medrol, breathing treatment, chest PT -Completed antibiotic -Repeat chest x-ray reviewed independently today.  Improved aeration compared to previous x-ray.  Small right pleural effusion. -One time dose IV lasix today   Hypertension -Metoprolol, hydralazine  CAD status post CABG -Aspirin, Plavix  Obesity -BMI 32  DVT prophylaxis:  enoxaparin (LOVENOX) injection 40 mg Start: 10/29/22 2200 SCDs Start: 10/29/22 1451  Code Status: DNR Family Communication: None at  bedside Disposition Plan:  Status is: Inpatient Remains inpatient appropriate because: respiratory failure     Antimicrobials:  Anti-infectives (From admission, onward)    Start     Dose/Rate Route Frequency Ordered Stop   11/01/22 1000  azithromycin (ZITHROMAX) tablet 500 mg        500 mg Oral Daily 11/01/22 0856 11/02/22 1217   10/29/22 1200  cefTRIAXone (ROCEPHIN) 2 g in sodium chloride 0.9 % 100 mL IVPB        2 g 200 mL/hr over 30 Minutes Intravenous Every 24 hours 10/29/22 1149 11/02/22 1249   10/29/22 1200  azithromycin (ZITHROMAX) 500 mg in sodium chloride 0.9 % 250 mL IVPB  Status:  Discontinued        500 mg 250 mL/hr over 60 Minutes Intravenous Every 24 hours 10/29/22 1149 11/01/22 0855        Objective: Vitals:   11/03/22 0004 11/03/22 0431 11/03/22 0842 11/03/22 0938  BP: 119/64 126/62  (!) 128/57  Pulse: (!) 51 (!) 50  63  Resp: 20 20    Temp: (!) 97.4 F (36.3 C) 97.8 F (36.6 C)    TempSrc: Oral Oral    SpO2: 93% 99% 91%   Weight:      Height:        Intake/Output Summary (Last 24 hours) at 11/03/2022 1031 Last data filed at 11/02/2022 2105 Gross per 24 hour  Intake 560 ml  Output 1000 ml  Net -440 ml    Filed Weights   10/29/22 1106 10/29/22 2212  Weight: 90.7 kg 94.9 kg    Examination:  General exam: Appears calm  Respiratory system:  Bilateral crackles without wheeze Cardiovascular system: S1 & S2 heard, RRR. No murmurs.  Mild pedal edema. Gastrointestinal system: Abdomen is nondistended, soft and nontender. Normal bowel sounds heard. Central nervous system: Alert and oriented. No focal neurological deficits. Speech clear.  Extremities: Symmetric in appearance  Skin: No rashes, lesions or ulcers on exposed skin  Psychiatry: Judgement and insight appear normal. Mood & affect appropriate.   Data Reviewed: I have personally reviewed following labs and imaging studies  CBC: Recent Labs  Lab 10/29/22 1213 10/30/22 0257 10/31/22 1248  11/02/22 0317  WBC 28.0* 26.9* 23.4* 9.9  NEUTROABS 26.1*  --   --   --   HGB 15.1 12.1* 15.5 14.6  HCT 46.4 37.2* 47.9 45.7  MCV 100.0 99.5 102.4* 100.4*  PLT 189 166 214 [id number]    Basic Metabolic Panel: Recent Labs  Lab 10/29/22 1213 10/30/22 0257 10/31/22 0331 11/01/22 0330 11/02/22 0317  NA 131* 132* 134* 139 137  K 3.6 3.7 4.8 4.3 4.8  CL 91* 100 107 106 104  CO2 25 23 17* 25 24  GLUCOSE 115* 141* 115* 126* 106*  BUN 25* 24* 34* 40* 38*  CREATININE 1.22 1.22 0.94 1.07 1.03  CALCIUM 8.1* 7.6* 7.7* 7.8* 8.0*  MG  --   --   --  2.5*  --     GFR: Estimated Creatinine Clearance: 70.1 mL/min (by C-G formula based on SCr of 1.03 mg/dL). Liver Function Tests: Recent Labs  Lab 10/29/22 1213  AST 37  ALT 14  ALKPHOS 64  BILITOT 1.3*  PROT 7.1  ALBUMIN 2.9*    No results for input(s): "LIPASE", "AMYLASE" in the last 168 hours. No results for input(s): "AMMONIA" in the last 168 hours. Coagulation Profile: Recent Labs  Lab 10/29/22 1213  INR 1.4*    Cardiac Enzymes: No results for input(s): "CKTOTAL", "CKMB", "CKMBINDEX", "TROPONINI" in the last 168 hours. BNP (last 3 results) No results for input(s): "PROBNP" in the last 8760 hours. HbA1C: No results for input(s): "HGBA1C" in the last 72 hours. CBG: Recent Labs  Lab 11/02/22 0745 11/02/22 1204 11/02/22 1656 11/02/22 2022 11/03/22 0732  GLUCAP 105* 123* 101* 122* 114*    Lipid Profile: No results for input(s): "CHOL", "HDL", "LDLCALC", "TRIG", "CHOLHDL", "LDLDIRECT" in the last 72 hours. Thyroid Function Tests: No results for input(s): "TSH", "T4TOTAL", "FREET4", "T3FREE", "THYROIDAB" in the last 72 hours. Anemia Panel: No results for input(s): "VITAMINB12", "FOLATE", "FERRITIN", "TIBC", "IRON", "RETICCTPCT" in the last 72 hours. Sepsis Labs: Recent Labs  Lab 10/29/22 1716 10/29/22 2209 10/30/22 0723 10/30/22 0949 10/31/22 1248  PROCALCITON  --   --  7.71  --  5.22  LATICACIDVEN 4.4* 3.0* 1.5  1.6  --      Recent Results (from the past 240 hour(s))  Resp panel by RT-PCR (RSV, Flu A&B, Covid) Anterior Nasal Swab     Status: None   Collection Time: 10/29/22 11:08 AM   Specimen: Anterior Nasal Swab  Result Value Ref Range Status   SARS Coronavirus 2 by RT PCR NEGATIVE NEGATIVE Final    Comment: (NOTE) SARS-CoV-2 target nucleic acids are NOT DETECTED.  The SARS-CoV-2 RNA is generally detectable in upper respiratory specimens during the acute phase of infection. The lowest concentration of SARS-CoV-2 viral copies this assay can detect is 138 copies/mL. A negative result does not preclude SARS-Cov-2 infection and should not be used as the sole basis for treatment or other patient management decisions. A negative result may occur with  improper  specimen collection/handling, submission of specimen other than nasopharyngeal swab, presence of viral mutation(s) within the areas targeted by this assay, and inadequate number of viral copies(<138 copies/mL). A negative result must be combined with clinical observations, patient history, and epidemiological information. The expected result is Negative.  Fact Sheet for Patients:  EntrepreneurPulse.com.au  Fact Sheet for Healthcare Providers:  IncredibleEmployment.be  This test is no t yet approved or cleared by the [place] FDA and  has been authorized for detection and/or diagnosis of SARS-CoV-2 by FDA under an Emergency Use Authorization (EUA). This EUA will remain  in effect (meaning this test can be used) for the duration of the COVID-19 declaration under Section 564(b)(1) of the Act, 21 U.S.C.section 360bbb-3(b)(1), unless the authorization is terminated  or revoked sooner.       Influenza A by PCR NEGATIVE NEGATIVE Final   Influenza B by PCR NEGATIVE NEGATIVE Final    Comment: (NOTE) The Xpert Xpress SARS-CoV-2/FLU/RSV plus assay is intended as an aid in the diagnosis of influenza  from Nasopharyngeal swab specimens and should not be used as a sole basis for treatment. Nasal washings and aspirates are unacceptable for Xpert Xpress SARS-CoV-2/FLU/RSV testing.  Fact Sheet for Patients: EntrepreneurPulse.com.au  Fact Sheet for Healthcare Providers: IncredibleEmployment.be  This test is not yet approved or cleared by the [place] FDA and has been authorized for detection and/or diagnosis of SARS-CoV-2 by FDA under an Emergency Use Authorization (EUA). This EUA will remain in effect (meaning this test can be used) for the duration of the COVID-19 declaration under Section 564(b)(1) of the Act, 21 U.S.C. section 360bbb-3(b)(1), unless the authorization is terminated or revoked.     Resp Syncytial Virus by PCR NEGATIVE NEGATIVE Final    Comment: (NOTE) Fact Sheet for Patients: EntrepreneurPulse.com.au  Fact Sheet for Healthcare Providers: IncredibleEmployment.be  This test is not yet approved or cleared by the [place] FDA and has been authorized for detection and/or diagnosis of SARS-CoV-2 by FDA under an Emergency Use Authorization (EUA). This EUA will remain in effect (meaning this test can be used) for the duration of the COVID-19 declaration under Section 564(b)(1) of the Act, 21 U.S.C. section 360bbb-3(b)(1), unless the authorization is terminated or revoked.  Performed at [facility], [place] [address] [place], [place] [address]   Blood Culture (routine x 2)     Status: None   Collection Time: 10/29/22 12:01 PM   Specimen: BLOOD RIGHT HAND  Result Value Ref Range Status   Specimen Description   Final    BLOOD RIGHT HAND Performed at [facility], [place] [address], [place], [place] [address]    Special Requests   Final    BOTTLES DRAWN AEROBIC AND ANAEROBIC Blood Culture results may not be optimal due to an inadequate volume of blood received in  culture bottles Performed at [place] [address] [place], [place] [address]    Culture   Final    NO GROWTH 5 DAYS Performed at [facility], [place] [address], [place], [place] [address]    Report Status 11/03/2022 FINAL  Final  Blood Culture (routine x 2)     Status: None   Collection Time: 10/29/22 12:13 PM   Specimen: BLOOD  Result Value Ref Range Status   Specimen Description   Final    BLOOD RIGHT ANTECUBITAL Performed at [place] [address] [place], [place] [address]    Special Requests   Final  BOTTLES DRAWN AEROBIC AND ANAEROBIC Blood Culture adequate volume Performed at [place] [address] [place], [place] [address]    Culture   Final    NO GROWTH 5 DAYS Performed at [facility], [place] [address], [place], [place] [address]    Report Status 11/03/2022 FINAL  Final  MRSA Next Gen by PCR, Nasal     Status: None   Collection Time: 10/29/22  9:43 PM   Specimen: Nasal Mucosa; Nasal Swab  Result Value Ref Range Status   MRSA by PCR Next Gen NOT DETECTED NOT DETECTED Final    Comment: (NOTE) The GeneXpert MRSA Assay (FDA approved for NASAL specimens only), is one component of a comprehensive MRSA colonization surveillance program. It is not intended to diagnose MRSA infection nor to guide or monitor treatment for MRSA infections. Test performance is not FDA approved in patients less than 20 years old. Performed at [facility], [place] [address], [place], [place] [address]   Expectorated Sputum Assessment w Gram Stain, Rflx to Resp Cult     Status: None   Collection Time: 11/01/22 11:56 AM   Specimen: Expectorated Sputum  Result Value Ref Range Status   Specimen Description EXPECTORATED SPUTUM  Final   Special Requests Normal  Final   Sputum evaluation   Final    THIS SPECIMEN IS ACCEPTABLE FOR SPUTUM CULTURE Performed at [facility],  [place] [address], [place], [place] [address]    Report Status 11/01/2022 FINAL  Final  Culture, Respiratory w Gram Stain     Status: None (Preliminary result)   Collection Time: 11/01/22 11:56 AM  Result Value Ref Range Status   Specimen Description   Final    EXPECTORATED SPUTUM Performed at [place] [address] [place], [place] [address]    Special Requests   Final    Normal Reflexed from [id number] Performed at [facility], [place] [address], [place], [place] [address]    Gram Stain NO WBC SEEN RARE GRAM POSITIVE COCCI   Final   Culture   Final    FEW Normal respiratory flora-no Staph aureus or Pseudomonas seen Performed at [facility], [place] [address] [place], [place] [address]    Report Status PENDING  Incomplete      Radiology Studies: DG CHEST PORT 1 VIEW  Result Date: 11/03/2022 CLINICAL DATA:  COPD EXAM: PORTABLE CHEST 1 VIEW COMPARISON:  10/29/2022 FINDINGS: Consolidation within the right mid lung zone has improved though residual infiltrate persists. Small right pleural effusion is unchanged. Left lung is clear. No pneumothorax. No pleural effusion on the left. Coronary artery bypass grafting has been performed. Cardiac size within normal limits. No acute bone abnormality. IMPRESSION: 1. Improving right mid lung zone infiltrate. Follow-up chest radiograph in 3-4 weeks is recommended to document complete resolution. 2. Stable small right pleural effusion. Electronically Signed   By: Fidela Salisbury M.D.   On: 11/03/2022 09:48      Scheduled Meds:  aspirin  81 mg Oral Daily   Chlorhexidine Gluconate Cloth  6 each Topical Daily   clopidogrel  75 mg Oral Daily   dextromethorphan-guaiFENesin  1 tablet Oral BID   enoxaparin (LOVENOX) injection  40 mg Subcutaneous Q24H   hydrALAZINE  25 mg Oral Q8H   insulin aspart  0-15 Units Subcutaneous TID WC   insulin aspart  0-5 Units Subcutaneous QHS   ipratropium-albuterol  3 mL  Nebulization TID  methylPREDNISolone (SOLU-MEDROL) injection  40 mg Intravenous Q12H   metoprolol succinate  25 mg Oral Daily   mometasone-formoterol  2 puff Inhalation BID   senna-docusate  1 tablet Oral BID   Continuous Infusions:     LOS: 5 days   Time spent: 25 minutes   Dessa Phi, DO [facility] 11/03/2022, 10:31 AM   Available via Epic secure chat 7am-7pm After these hours, please refer to coverage provider listed on [email or web address]

## 2022-11-03 NOTE — Care Management Important Message (Signed)
Important Message  Patient Details IM Letter given Name: Jonathan [name] MRN: [id number] Date of Birth: 07-31-1951   Medicare Important Message Given:  Yes     Kerin Salen 11/03/2022, 11:52 AM

## 2022-11-04 DIAGNOSIS — A419 Sepsis, unspecified organism: Secondary | ICD-10-CM | POA: Diagnosis not present

## 2022-11-04 DIAGNOSIS — R6520 Severe sepsis without septic shock: Secondary | ICD-10-CM | POA: Diagnosis not present

## 2022-11-04 LAB — BASIC METABOLIC PANEL
Anion gap: 9 (ref 5–15)
BUN: 23 mg/dL (ref 8–23)
CO2: 29 mmol/L (ref 22–32)
Calcium: 7.7 mg/dL — ABNORMAL LOW (ref 8.9–10.3)
Chloride: 97 mmol/L — ABNORMAL LOW (ref 98–111)
Creatinine, Ser: 0.94 mg/dL (ref 0.61–1.24)
GFR, Estimated: >60 mL/min (ref >60)
Glucose, Bld: 96 mg/dL (ref 70–99)
Potassium: 4.3 mmol/L (ref 3.5–5.1)
Sodium: 135 mmol/L (ref 135–145)

## 2022-11-04 LAB — GLUCOSE, CAPILLARY
Glucose-Capillary: 84 mg/dL (ref 70–99)
Glucose-Capillary: 94 mg/dL (ref 70–99)
Glucose-Capillary: 103 mg/dL — ABNORMAL HIGH (ref 70–99)
Glucose-Capillary: 92 mg/dL (ref 70–99)

## 2022-11-04 NOTE — Progress Notes (Incomplete)
RT note: Pt. agreed to try Bipap after scheduled DuoNeb/Dulera given, had productive cough for small # tan/white sputum

## 2022-11-04 NOTE — Progress Notes (Signed)
RT note: PT. seen per request, given Flutter device, was unable to locate previous one, educated on use, able to use independently with good effort.

## 2022-11-04 NOTE — Progress Notes (Signed)
PROGRESS NOTE    Jonathan [name]  [id number] DOB: Feb 16, 1951 DOA: 10/29/2022 PCP: Leonard Downing, MD     Brief Narrative:  Jonathan [name] is a 72 y.o. male with medical history significant for COPD on room air at baseline, hypertension, hyperlipidemia, CAD status post CABG being admitted to the hospital with severe sepsis due to RU and RML community-acquired pneumonia complicated by acute exacerbation of COPD.  His oxygen saturation at urgent care was 84%.  He was placed on oxygen, transferred to the emergency department by EMS, he was given 3 DuoNebs, 125 mg Solu-Medrol and 2 g of magnesium en route. He was initially requiring BiPAP, weaned down to 4 L high flow nasal cannula O2.  New events last 24 hours / Subjective: Feeling about the same, remains on 6L O2  Assessment & Plan:  Principal Problem:   Severe sepsis ([place]) Active Problems:   Essential hypertension, benign   Hyperlipidemia   Obesity (BMI 30.0-34.9)   S/P CABG x 4   COPD with acute exacerbation (HCC)   Leukocytosis   CAP (community acquired pneumonia)   Lactic acid acidosis   Hyponatremia   Streptococcus pneumoniae   HTN (hypertension)   Severe sepsis secondary to multifocal pneumonia, strep pneumo -Severe sepsis present on admission -Completed antibiotic  Acute hypoxemic respiratory failure secondary to pneumonia, COPD exacerbation -Currently requiring 6 L nasal cannula O2 -Continue Solu-Medrol, breathing treatment, chest PT -Completed antibiotic -Repeat chest x-ray improved aeration compared to previous x-ray.  Small right pleural effusion.  Hypertension -Metoprolol, hydralazine  CAD status post CABG -Aspirin, Plavix  Obesity -BMI 32  DVT prophylaxis:  enoxaparin (LOVENOX) injection 40 mg Start: 10/29/22 2200 SCDs Start: 10/29/22 1451  Code Status: DNR Family Communication: Wife at bedside Disposition Plan:  Status is: Inpatient Remains inpatient appropriate because: respiratory  failure     Antimicrobials:  Anti-infectives (From admission, onward)    Start     Dose/Rate Route Frequency Ordered Stop   11/01/22 1000  azithromycin (ZITHROMAX) tablet 500 mg        500 mg Oral Daily 11/01/22 0856 11/02/22 1217   10/29/22 1200  cefTRIAXone (ROCEPHIN) 2 g in sodium chloride 0.9 % 100 mL IVPB        2 g 200 mL/hr over 30 Minutes Intravenous Every 24 hours 10/29/22 1149 11/02/22 1249   10/29/22 1200  azithromycin (ZITHROMAX) 500 mg in sodium chloride 0.9 % 250 mL IVPB  Status:  Discontinued        500 mg 250 mL/hr over 60 Minutes Intravenous Every 24 hours 10/29/22 1149 11/01/22 0855        Objective: Vitals:   11/03/22 2037 11/03/22 2237 11/04/22 0419 11/04/22 1030  BP: 134/76  120/84 111/64  Pulse: (!) 55  79 89  Resp: 20  20   Temp: 97.8 F (36.6 C)  97.6 F (36.4 C)   TempSrc: Oral  Oral   SpO2: 95% 98% 90%   Weight:      Height:        Intake/Output Summary (Last 24 hours) at 11/04/2022 1147 Last data filed at 11/04/2022 0847 Gross per 24 hour  Intake 480 ml  Output 1800 ml  Net -1320 ml    Filed Weights   10/29/22 1106 10/29/22 2212  Weight: 90.7 kg 94.9 kg    Examination:  General exam: Appears calm  Respiratory system: Bilateral crackles with minimal wheeze Cardiovascular system: S1 & S2 heard, RRR. No murmurs.  No pedal edema. Gastrointestinal system:  Abdomen is nondistended, soft and nontender. Normal bowel sounds heard. Central nervous system: Alert and oriented. No focal neurological deficits. Speech clear.  Extremities: Symmetric in appearance  Skin: No rashes, lesions or ulcers on exposed skin  Psychiatry: Judgement and insight appear normal. Mood & affect appropriate.   Data Reviewed: I have personally reviewed following labs and imaging studies  CBC: Recent Labs  Lab 10/29/22 1213 10/30/22 0257 10/31/22 1248 11/02/22 0317  WBC 28.0* 26.9* 23.4* 9.9  NEUTROABS 26.1*  --   --   --   HGB 15.1 12.1* 15.5 14.6  HCT 46.4  37.2* 47.9 45.7  MCV 100.0 99.5 102.4* 100.4*  PLT 189 166 214 [id number]    Basic Metabolic Panel: Recent Labs  Lab 10/30/22 0257 10/31/22 0331 11/01/22 0330 11/02/22 0317 11/04/22 0359  NA 132* 134* 139 137 135  K 3.7 4.8 4.3 4.8 4.3  CL 100 107 106 104 97*  CO2 23 17* '25 24 29  '$ GLUCOSE 141* 115* 126* 106* 96  BUN 24* 34* 40* 38* 23  CREATININE 1.22 0.94 1.07 1.03 0.94  CALCIUM 7.6* 7.7* 7.8* 8.0* 7.7*  MG  --   --  2.5*  --   --     GFR: Estimated Creatinine Clearance: 76.8 mL/min (by C-G formula based on SCr of 0.94 mg/dL). Liver Function Tests: Recent Labs  Lab 10/29/22 1213  AST 37  ALT 14  ALKPHOS 64  BILITOT 1.3*  PROT 7.1  ALBUMIN 2.9*    No results for input(s): "LIPASE", "AMYLASE" in the last 168 hours. No results for input(s): "AMMONIA" in the last 168 hours. Coagulation Profile: Recent Labs  Lab 10/29/22 1213  INR 1.4*    Cardiac Enzymes: No results for input(s): "CKTOTAL", "CKMB", "CKMBINDEX", "TROPONINI" in the last 168 hours. BNP (last 3 results) No results for input(s): "PROBNP" in the last 8760 hours. HbA1C: No results for input(s): "HGBA1C" in the last 72 hours. CBG: Recent Labs  Lab 11/03/22 0732 11/03/22 1204 11/03/22 1657 11/03/22 2038 11/04/22 0744  GLUCAP 114* 127* 89 125* 84    Lipid Profile: No results for input(s): "CHOL", "HDL", "LDLCALC", "TRIG", "CHOLHDL", "LDLDIRECT" in the last 72 hours. Thyroid Function Tests: No results for input(s): "TSH", "T4TOTAL", "FREET4", "T3FREE", "THYROIDAB" in the last 72 hours. Anemia Panel: No results for input(s): "VITAMINB12", "FOLATE", "FERRITIN", "TIBC", "IRON", "RETICCTPCT" in the last 72 hours. Sepsis Labs: Recent Labs  Lab 10/29/22 1716 10/29/22 2209 10/30/22 0723 10/30/22 0949 10/31/22 1248  PROCALCITON  --   --  7.71  --  5.22  LATICACIDVEN 4.4* 3.0* 1.5 1.6  --      Recent Results (from the past 240 hour(s))  Resp panel by RT-PCR (RSV, Flu A&B, Covid) Anterior Nasal  Swab     Status: None   Collection Time: 10/29/22 11:08 AM   Specimen: Anterior Nasal Swab  Result Value Ref Range Status   SARS Coronavirus 2 by RT PCR NEGATIVE NEGATIVE Final    Comment: (NOTE) SARS-CoV-2 target nucleic acids are NOT DETECTED.  The SARS-CoV-2 RNA is generally detectable in upper respiratory specimens during the acute phase of infection. The lowest concentration of SARS-CoV-2 viral copies this assay can detect is 138 copies/mL. A negative result does not preclude SARS-Cov-2 infection and should not be used as the sole basis for treatment or other patient management decisions. A negative result may occur with  improper specimen collection/handling, submission of specimen other than nasopharyngeal swab, presence of viral mutation(s) within the areas targeted by this  assay, and inadequate number of viral copies(<138 copies/mL). A negative result must be combined with clinical observations, patient history, and epidemiological information. The expected result is Negative.  Fact Sheet for Patients:  EntrepreneurPulse.com.au  Fact Sheet for Healthcare Providers:  IncredibleEmployment.be  This test is no t yet approved or cleared by the [place] FDA and  has been authorized for detection and/or diagnosis of SARS-CoV-2 by FDA under an Emergency Use Authorization (EUA). This EUA will remain  in effect (meaning this test can be used) for the duration of the COVID-19 declaration under Section 564(b)(1) of the Act, 21 U.S.C.section 360bbb-3(b)(1), unless the authorization is terminated  or revoked sooner.       Influenza A by PCR NEGATIVE NEGATIVE Final   Influenza B by PCR NEGATIVE NEGATIVE Final    Comment: (NOTE) The Xpert Xpress SARS-CoV-2/FLU/RSV plus assay is intended as an aid in the diagnosis of influenza from Nasopharyngeal swab specimens and should not be used as a sole basis for treatment. Nasal washings and aspirates  are unacceptable for Xpert Xpress SARS-CoV-2/FLU/RSV testing.  Fact Sheet for Patients: EntrepreneurPulse.com.au  Fact Sheet for Healthcare Providers: IncredibleEmployment.be  This test is not yet approved or cleared by the [place] FDA and has been authorized for detection and/or diagnosis of SARS-CoV-2 by FDA under an Emergency Use Authorization (EUA). This EUA will remain in effect (meaning this test can be used) for the duration of the COVID-19 declaration under Section 564(b)(1) of the Act, 21 U.S.C. section 360bbb-3(b)(1), unless the authorization is terminated or revoked.     Resp Syncytial Virus by PCR NEGATIVE NEGATIVE Final    Comment: (NOTE) Fact Sheet for Patients: EntrepreneurPulse.com.au  Fact Sheet for Healthcare Providers: IncredibleEmployment.be  This test is not yet approved or cleared by the [place] FDA and has been authorized for detection and/or diagnosis of SARS-CoV-2 by FDA under an Emergency Use Authorization (EUA). This EUA will remain in effect (meaning this test can be used) for the duration of the COVID-19 declaration under Section 564(b)(1) of the Act, 21 U.S.C. section 360bbb-3(b)(1), unless the authorization is terminated or revoked.  Performed at [facility], [place] [address], [place], [place] [address]   Blood Culture (routine x 2)     Status: None   Collection Time: 10/29/22 12:01 PM   Specimen: BLOOD RIGHT HAND  Result Value Ref Range Status   Specimen Description   Final    BLOOD RIGHT HAND Performed at [facility], [place] [address], [place], [place] [address]    Special Requests   Final    BOTTLES DRAWN AEROBIC AND ANAEROBIC Blood Culture results may not be optimal due to an inadequate volume of blood received in culture bottles Performed at [facility] [address], [place], [place] [address]    Culture    Final    NO GROWTH 5 DAYS Performed at [facility], [place] [address], [place], [place] [address]    Report Status 11/03/2022 FINAL  Final  Blood Culture (routine x 2)     Status: None   Collection Time: 10/29/22 12:13 PM   Specimen: BLOOD  Result Value Ref Range Status   Specimen Description   Final    BLOOD RIGHT ANTECUBITAL Performed at [place] [address] [place],  [address]    Special Requests   Final    BOTTLES DRAWN AEROBIC AND ANAEROBIC Blood Culture adequate volume Performed at [facility], [address] [place], [place] [address]    Culture   Final    NO GROWTH 5 DAYS Performed at [facility], [place] [address], [place], [place] [address]    Report Status 11/03/2022 FINAL  Final  MRSA Next Gen by PCR, Nasal     Status: None   Collection Time: 10/29/22  9:43 PM   Specimen: Nasal Mucosa; Nasal Swab  Result Value Ref Range Status   MRSA by PCR Next Gen NOT DETECTED NOT DETECTED Final    Comment: (NOTE) The GeneXpert MRSA Assay (FDA approved for NASAL specimens only), is one component of a comprehensive MRSA colonization surveillance program. It is not intended to diagnose MRSA infection nor to guide or monitor treatment for MRSA infections. Test performance is not FDA approved in patients less than 69 years old. Performed at [facility], [place] [address], [place], [place] [address]   Expectorated Sputum Assessment w Gram Stain, Rflx to Resp Cult     Status: None   Collection Time: 11/01/22 11:56 AM   Specimen: Expectorated Sputum  Result Value Ref Range Status   Specimen Description EXPECTORATED SPUTUM  Final   Special Requests Normal  Final   Sputum evaluation   Final    THIS SPECIMEN IS ACCEPTABLE FOR SPUTUM CULTURE Performed at [facility], [place] [address], [place], [place] [address]    Report Status 11/01/2022 FINAL  Final  Culture, Respiratory w Gram Stain      Status: None   Collection Time: 11/01/22 11:56 AM  Result Value Ref Range Status   Specimen Description   Final    EXPECTORATED SPUTUM Performed at [facility], [place] [address] [place], [place] [address]    Special Requests   Final    Normal Reflexed from [id number] Performed at [facility], [place] [address] [place], [place] [address]    Gram Stain NO WBC SEEN RARE GRAM POSITIVE COCCI   Final   Culture   Final    FEW Normal respiratory flora-no Staph aureus or Pseudomonas seen Performed at [facility], [place] [address] [place], [place] [address]    Report Status 11/03/2022 FINAL  Final      Radiology Studies: DG CHEST PORT 1 VIEW  Result Date: 11/03/2022 CLINICAL DATA:  COPD EXAM: PORTABLE CHEST 1 VIEW COMPARISON:  10/29/2022 FINDINGS: Consolidation within the right mid lung zone has improved though residual infiltrate persists. Small right pleural effusion is unchanged. Left lung is clear. No pneumothorax. No pleural effusion on the left. Coronary artery bypass grafting has been performed. Cardiac size within normal limits. No acute bone abnormality. IMPRESSION: 1. Improving right mid lung zone infiltrate. Follow-up chest radiograph in 3-4 weeks is recommended to document complete resolution. 2. Stable small right pleural effusion. Electronically Signed   By: Fidela Salisbury M.D.   On: 11/03/2022 09:48      Scheduled Meds:  aspirin  81 mg Oral Daily   clopidogrel  75 mg Oral Daily   dextromethorphan-guaiFENesin  1 tablet Oral BID   enoxaparin (LOVENOX) injection  40 mg Subcutaneous Q24H   hydrALAZINE  25 mg Oral Q8H   insulin aspart  0-15 Units Subcutaneous TID WC   insulin aspart  0-5 Units Subcutaneous QHS   ipratropium-albuterol  3 mL Nebulization TID   methylPREDNISolone (SOLU-MEDROL) injection  40 mg Intravenous Q12H   metoprolol succinate  25 mg Oral Daily   mometasone-formoterol  2 puff Inhalation BID  Continuous  Infusions:     LOS: 6 days   Time spent: 25 minutes   Dessa Phi, DO [facility] 11/04/2022, 11:47 AM   Available via Epic secure chat 7am-7pm After these hours, please refer to coverage provider listed on [email or web address]

## 2022-11-04 NOTE — Plan of Care (Signed)
  Problem: Education: Goal: Knowledge of General Education information will improve Description: Including pain rating scale, medication(s)/side effects and non-pharmacologic comfort measures Outcome: Progressing   Problem: Activity: Goal: Risk for activity intolerance will decrease Outcome: Progressing   Problem: Pain Managment: Goal: General experience of comfort will improve Outcome: Progressing   

## 2022-11-04 NOTE — Progress Notes (Signed)
Physical Therapy Treatment Patient Details Name: Jonathan [name] MRN: [id number] DOB: Apr 30, 1951 Today's Date: 11/04/2022   History of Present Illness Pt is 72 yo male admitted 10/29/22 with sepsis, resp failure, PNE, and COPD exacerbation.  Pt with hx including but not limited to COPD on room air at baseline, hypertension, hyperlipidemia, CAD status post CABG    PT Comments    General Comments: AxO x 3 very pleasant man with Spouse present during session. Assisted OOB to amb went well.  General bed mobility comments: HOB elevated greatly and assist for upper body to achieve EOB. Pt stated he sleeps in a recliner.  "I can breath better".  Assisted with amb in hallway went well however he was only able to wean down to 4 lts but back up to 6 lts after session.  General Gait Details: tolerated amb 75 feet but with multiple standing rest breaks due to sats decreasing with actiivty.  Pt started on 6 lts at rest at 95%.  Was able to wean pt to 4 lts with amb stats ranged from 87 to 90% and HR 86.  Pt instructed on purse lip breathing.  Max coughing after activity.  Pt wanted to amb more however Therapist encouraged "NO this is good" as sats had a slo recovery and Pt's coughing was causing increased HR and fatigue.  Recliner following for safety.  Spouse present during. Returned to room in recliner and reapplied oxygen back to 6 lts to achieve >88% as pt continued to recover. Pt plans to return home with spouse.    Recommendations for follow up therapy are one component of a multi-disciplinary discharge planning process, led by the attending physician.  Recommendations may be updated based on patient status, additional functional criteria and insurance authorization.  Follow Up Recommendations  Home health PT     Assistance Recommended at Discharge Intermittent Supervision/Assistance  Patient can return home with the following A little help with walking and/or transfers;A little help with  bathing/dressing/bathroom;Assistance with cooking/housework   Equipment Recommendations  None recommended by PT    Recommendations for Other Services       Precautions / Restrictions Precautions Precautions: Fall Precaution Comments: monitor vitals Restrictions Weight Bearing Restrictions: No     Mobility  Bed Mobility Overal bed mobility: Needs Assistance Bed Mobility: Supine to Sit     Supine to sit: Min assist, Mod assist     General bed mobility comments: HOB elevated greatly and assist for upper body to achieve EOB.    Transfers Overall transfer level: Needs assistance Equipment used: Rolling walker (2 wheels) Transfers: Sit to/from Stand Sit to Stand: Supervision, Min guard           General transfer comment: pt able to self rise from elevated bed using forward momentum.    Ambulation/Gait Ambulation/Gait assistance: Min assist Gait Distance (Feet): 75 Feet Assistive device: Rolling walker (2 wheels) Gait Pattern/deviations: Step-through pattern Gait velocity: decreased     General Gait Details: tolerated amb 75 feet but with multiple standing rest breaks due to sats decreasing with actiivty.  Pt started on 6 lts at rest at 95%.  Was able to wean pt to 4 lts with amb stats ranged from 87 to 90% and HR 86.  Pt instructed on purse lip breathing.  Max coughing after activity.  Pt wanted to amb more however Therapist encouraged "NO this is good" as sats had a slo recovery and Pt's coughing was causing increased HR and fatigue.  Recliner following for  safety.  Spouse present during. Returned to room in recliner and reapplied oxygen back to 6 lts to achieve >88% as pt continued to recover.   Stairs             Wheelchair Mobility    Modified Rankin (Stroke Patients Only)       Balance                                            Cognition Arousal/Alertness: Awake/alert Behavior During Therapy: WFL for tasks  assessed/performed Overall Cognitive Status: Within Functional Limits for tasks assessed                                 General Comments: AxO x 3 very pleasant man with Spouse present during session.        Exercises      General Comments        Pertinent Vitals/Pain Pain Assessment Pain Assessment: No/denies pain    Home Living                          Prior Function            PT Goals (current goals can now be found in the care plan section) Progress towards PT goals: Progressing toward goals    Frequency    Min 3X/week      PT Plan Current plan remains appropriate    Co-evaluation              AM-PAC PT "6 Clicks" Mobility   Outcome Measure  Help needed turning from your back to your side while in a flat bed without using bedrails?: A Little Help needed moving from lying on your back to sitting on the side of a flat bed without using bedrails?: A Little Help needed moving to and from a bed to a chair (including a wheelchair)?: A Little Help needed standing up from a chair using your arms (e.g., wheelchair or bedside chair)?: A Little Help needed to walk in hospital room?: A Little Help needed climbing 3-5 steps with a railing? : A Little 6 Click Score: 18    End of Session Equipment Utilized During Treatment: Gait belt Activity Tolerance: Patient tolerated treatment well Patient left: in chair;with call bell/phone within reach;with family/visitor present Nurse Communication: Mobility status PT Visit Diagnosis: Other abnormalities of gait and mobility (R26.89);Muscle weakness (generalized) (M62.81)     Time: [id number] PT Time Calculation (min) (ACUTE ONLY): 28 min  Charges:  $Gait Training: 8-22 mins $Therapeutic Activity: 8-22 mins                    Rica Koyanagi  PTA [place] [facility] M-F          [phone] Weekend pager [phone]

## 2022-11-04 NOTE — Plan of Care (Signed)
  Problem: Education: Goal: Knowledge of General Education information will improve Description Including pain rating scale, medication(s)/side effects and non-pharmacologic comfort measures Outcome: Progressing   Problem: Health Behavior/Discharge Planning: Goal: Ability to manage health-related needs will improve Outcome: Progressing   

## 2022-11-05 DIAGNOSIS — A419 Sepsis, unspecified organism: Secondary | ICD-10-CM | POA: Diagnosis not present

## 2022-11-05 DIAGNOSIS — R6520 Severe sepsis without septic shock: Secondary | ICD-10-CM | POA: Diagnosis not present

## 2022-11-05 LAB — GLUCOSE, CAPILLARY
Glucose-Capillary: 80 mg/dL (ref 70–99)
Glucose-Capillary: 97 mg/dL (ref 70–99)
Glucose-Capillary: 144 mg/dL — ABNORMAL HIGH (ref 70–99)
Glucose-Capillary: 114 mg/dL — ABNORMAL HIGH (ref 70–99)
Glucose-Capillary: 88 mg/dL (ref 70–99)

## 2022-11-05 LAB — CREATININE, SERUM
Creatinine, Ser: 0.93 mg/dL (ref 0.61–1.24)
GFR, Estimated: >60 mL/min (ref >60)

## 2022-11-05 MED ORDER — HYDROCODONE BIT-HOMATROP MBR 5-1.5 MG/5ML PO SOLN
5.0000 mL | Freq: Four times a day (QID) | ORAL | Status: DC | PRN
  Administered 2022-11-05 – 2022-11-07 (×5): 5 mL via ORAL
  Filled 2022-11-05 (×5): qty 5

## 2022-11-05 MED ORDER — METHYLPREDNISOLONE SODIUM SUCC 40 MG IJ SOLR
40.0000 mg | Freq: Every day | INTRAMUSCULAR | Status: DC
  Administered 2022-11-06: 40 mg via INTRAVENOUS
  Filled 2022-11-05: qty 1

## 2022-11-05 NOTE — Progress Notes (Signed)
PROGRESS NOTE    THADDUS GIKAS  [id number] DOB: Apr 10, 1951 DOA: 10/29/2022 PCP: Leonard Downing, MD     Brief Narrative:  MCDANIEL STONEBURG is a 72 y.o. male with medical history significant for COPD on room air at baseline, hypertension, hyperlipidemia, CAD status post CABG being admitted to the hospital with severe sepsis due to RU and RML community-acquired pneumonia complicated by acute exacerbation of COPD.  His oxygen saturation at urgent care was 84%.  He was placed on oxygen, transferred to the emergency department by EMS, he was given 3 DuoNebs, 125 mg Solu-Medrol and 2 g of magnesium en route. He was initially requiring BiPAP, weaned down to 4 L high flow nasal cannula O2.  New events last 24 hours / Subjective: No new change.  Worked with PT yesterday.  Remains on 6 L oxygen.  Assessment & Plan:  Principal Problem:   Severe sepsis ([place]) Active Problems:   Essential hypertension, benign   Hyperlipidemia   Obesity (BMI 30.0-34.9)   S/P CABG x 4   COPD with acute exacerbation (HCC)   Leukocytosis   CAP (community acquired pneumonia)   Lactic acid acidosis   Hyponatremia   Streptococcus pneumoniae   HTN (hypertension)   Severe sepsis secondary to multifocal pneumonia, strep pneumo -Severe sepsis present on admission -Completed antibiotic  Acute hypoxemic respiratory failure secondary to pneumonia, COPD exacerbation -Currently requiring 6 L nasal cannula O2 -Continue Solu-Medrol, breathing treatment -Completed antibiotic -May need to be set up with home oxygen for discharge  Hypertension -Metoprolol, hydralazine  CAD status post CABG -Aspirin, Plavix  Obesity -BMI 32  DVT prophylaxis:  enoxaparin (LOVENOX) injection 40 mg Start: 10/29/22 2200 SCDs Start: 10/29/22 1451  Code Status: DNR Family Communication: Wife at bedside Disposition Plan:  Status is: Inpatient Remains inpatient appropriate because: respiratory failure     Antimicrobials:   Anti-infectives (From admission, onward)    Start     Dose/Rate Route Frequency Ordered Stop   11/01/22 1000  azithromycin (ZITHROMAX) tablet 500 mg        500 mg Oral Daily 11/01/22 0856 11/02/22 1217   10/29/22 1200  cefTRIAXone (ROCEPHIN) 2 g in sodium chloride 0.9 % 100 mL IVPB        2 g 200 mL/hr over 30 Minutes Intravenous Every 24 hours 10/29/22 1149 11/02/22 1249   10/29/22 1200  azithromycin (ZITHROMAX) 500 mg in sodium chloride 0.9 % 250 mL IVPB  Status:  Discontinued        500 mg 250 mL/hr over 60 Minutes Intravenous Every 24 hours 10/29/22 1149 11/01/22 0855        Objective: Vitals:   11/05/22 0521 11/05/22 0808 11/05/22 0814 11/05/22 0927  BP: 125/66   120/62  Pulse: (!) 55   79  Resp: 20     Temp: 97.6 F (36.4 C)     TempSrc: Oral     SpO2: 99% 98% 98%   Weight:      Height:        Intake/Output Summary (Last 24 hours) at 11/05/2022 1207 Last data filed at 11/05/2022 [id number] Gross per 24 hour  Intake 360 ml  Output 2050 ml  Net -1690 ml    Filed Weights   10/29/22 1106 10/29/22 2212  Weight: 90.7 kg 94.9 kg    Examination:  General exam: Appears calm  Respiratory system: Diminished breath sounds, mild expiratory wheezes, on 6 L oxygen Cardiovascular system: S1 & S2 heard, RRR. No murmurs.  No pedal  edema. Gastrointestinal system: Abdomen is nondistended, soft and nontender. Normal bowel sounds heard. Central nervous system: Alert and oriented. No focal neurological deficits. Speech clear.  Extremities: Symmetric in appearance  Skin: No rashes, lesions or ulcers on exposed skin  Psychiatry: Judgement and insight appear normal. Mood & affect appropriate.   Data Reviewed: I have personally reviewed following labs and imaging studies  CBC: Recent Labs  Lab 10/29/22 1213 10/30/22 0257 10/31/22 1248 11/02/22 0317  WBC 28.0* 26.9* 23.4* 9.9  NEUTROABS 26.1*  --   --   --   HGB 15.1 12.1* 15.5 14.6  HCT 46.4 37.2* 47.9 45.7  MCV 100.0 99.5  102.4* 100.4*  PLT 189 166 214 [id number]    Basic Metabolic Panel: Recent Labs  Lab 10/30/22 0257 10/31/22 0331 11/01/22 0330 11/02/22 0317 11/04/22 0359 11/05/22 0457  NA 132* 134* 139 137 135  --   K 3.7 4.8 4.3 4.8 4.3  --   CL 100 107 106 104 97*  --   CO2 23 17* '25 24 29  '$ --   GLUCOSE 141* 115* 126* 106* 96  --   BUN 24* 34* 40* 38* 23  --   CREATININE 1.22 0.94 1.07 1.03 0.94 0.93  CALCIUM 7.6* 7.7* 7.8* 8.0* 7.7*  --   MG  --   --  2.5*  --   --   --     GFR: Estimated Creatinine Clearance: 77.6 mL/min (by C-G formula based on SCr of 0.93 mg/dL). Liver Function Tests: Recent Labs  Lab 10/29/22 1213  AST 37  ALT 14  ALKPHOS 64  BILITOT 1.3*  PROT 7.1  ALBUMIN 2.9*    No results for input(s): "LIPASE", "AMYLASE" in the last 168 hours. No results for input(s): "AMMONIA" in the last 168 hours. Coagulation Profile: Recent Labs  Lab 10/29/22 1213  INR 1.4*    Cardiac Enzymes: No results for input(s): "CKTOTAL", "CKMB", "CKMBINDEX", "TROPONINI" in the last 168 hours. BNP (last 3 results) No results for input(s): "PROBNP" in the last 8760 hours. HbA1C: No results for input(s): "HGBA1C" in the last 72 hours. CBG: Recent Labs  Lab 11/04/22 1201 11/04/22 1625 11/04/22 2118 11/05/22 0810 11/05/22 1105  GLUCAP 94 103* 92 80 97    Lipid Profile: No results for input(s): "CHOL", "HDL", "LDLCALC", "TRIG", "CHOLHDL", "LDLDIRECT" in the last 72 hours. Thyroid Function Tests: No results for input(s): "TSH", "T4TOTAL", "FREET4", "T3FREE", "THYROIDAB" in the last 72 hours. Anemia Panel: No results for input(s): "VITAMINB12", "FOLATE", "FERRITIN", "TIBC", "IRON", "RETICCTPCT" in the last 72 hours. Sepsis Labs: Recent Labs  Lab 10/29/22 1716 10/29/22 2209 10/30/22 0723 10/30/22 0949 10/31/22 1248  PROCALCITON  --   --  7.71  --  5.22  LATICACIDVEN 4.4* 3.0* 1.5 1.6  --      Recent Results (from the past 240 hour(s))  Resp panel by RT-PCR (RSV, Flu A&B,  Covid) Anterior Nasal Swab     Status: None   Collection Time: 10/29/22 11:08 AM   Specimen: Anterior Nasal Swab  Result Value Ref Range Status   SARS Coronavirus 2 by RT PCR NEGATIVE NEGATIVE Final    Comment: (NOTE) SARS-CoV-2 target nucleic acids are NOT DETECTED.  The SARS-CoV-2 RNA is generally detectable in upper respiratory specimens during the acute phase of infection. The lowest concentration of SARS-CoV-2 viral copies this assay can detect is 138 copies/mL. A negative result does not preclude SARS-Cov-2 infection and should not be used as the sole basis for treatment or other  patient management decisions. A negative result may occur with  improper specimen collection/handling, submission of specimen other than nasopharyngeal swab, presence of viral mutation(s) within the areas targeted by this assay, and inadequate number of viral copies(<138 copies/mL). A negative result must be combined with clinical observations, patient history, and epidemiological information. The expected result is Negative.  Fact Sheet for Patients:  EntrepreneurPulse.com.au  Fact Sheet for Healthcare Providers:  IncredibleEmployment.be  This test is no t yet approved or cleared by the [place] FDA and  has been authorized for detection and/or diagnosis of SARS-CoV-2 by FDA under an Emergency Use Authorization (EUA). This EUA will remain  in effect (meaning this test can be used) for the duration of the COVID-19 declaration under Section 564(b)(1) of the Act, 21 U.S.C.section 360bbb-3(b)(1), unless the authorization is terminated  or revoked sooner.       Influenza A by PCR NEGATIVE NEGATIVE Final   Influenza B by PCR NEGATIVE NEGATIVE Final    Comment: (NOTE) The Xpert Xpress SARS-CoV-2/FLU/RSV plus assay is intended as an aid in the diagnosis of influenza from Nasopharyngeal swab specimens and should not be used as a sole basis for treatment. Nasal  washings and aspirates are unacceptable for Xpert Xpress SARS-CoV-2/FLU/RSV testing.  Fact Sheet for Patients: EntrepreneurPulse.com.au  Fact Sheet for Healthcare Providers: IncredibleEmployment.be  This test is not yet approved or cleared by the [place] FDA and has been authorized for detection and/or diagnosis of SARS-CoV-2 by FDA under an Emergency Use Authorization (EUA). This EUA will remain in effect (meaning this test can be used) for the duration of the COVID-19 declaration under Section 564(b)(1) of the Act, 21 U.S.C. section 360bbb-3(b)(1), unless the authorization is terminated or revoked.     Resp Syncytial Virus by PCR NEGATIVE NEGATIVE Final    Comment: (NOTE) Fact Sheet for Patients: EntrepreneurPulse.com.au  Fact Sheet for Healthcare Providers: IncredibleEmployment.be  This test is not yet approved or cleared by the [place] FDA and has been authorized for detection and/or diagnosis of SARS-CoV-2 by FDA under an Emergency Use Authorization (EUA). This EUA will remain in effect (meaning this test can be used) for the duration of the COVID-19 declaration under Section 564(b)(1) of the Act, 21 U.S.C. section 360bbb-3(b)(1), unless the authorization is terminated or revoked.  Performed at [facility], [place] [address] [place], [place] [address]   Blood Culture (routine x 2)     Status: None   Collection Time: 10/29/22 12:01 PM   Specimen: BLOOD RIGHT HAND  Result Value Ref Range Status   Specimen Description   Final    BLOOD RIGHT HAND Performed at [facility], [place] [address], [place], [place] [address]    Special Requests   Final    BOTTLES DRAWN AEROBIC AND ANAEROBIC Blood Culture results may not be optimal due to an inadequate volume of blood received in culture bottles Performed at [facility] [address] [place],   [address]    Culture   Final    NO GROWTH 5 DAYS Performed at [facility], [place] [address], [place],  [address]    Report Status 11/03/2022 FINAL  Final  Blood Culture (routine x 2)     Status: None   Collection Time: 10/29/22 12:13 PM   Specimen: BLOOD  Result Value Ref Range Status   Specimen Description   Final    BLOOD RIGHT ANTECUBITAL Performed at [place] [address]  [place], [place] [address]    Special Requests   Final    BOTTLES DRAWN AEROBIC AND ANAEROBIC Blood Culture adequate volume Performed at [place] [address] [place], [place] [address]    Culture   Final    NO GROWTH 5 DAYS Performed at [facility], [place] [address], [place], [place] [address]    Report Status 11/03/2022 FINAL  Final  MRSA Next Gen by PCR, Nasal     Status: None   Collection Time: 10/29/22  9:43 PM   Specimen: Nasal Mucosa; Nasal Swab  Result Value Ref Range Status   MRSA by PCR Next Gen NOT DETECTED NOT DETECTED Final    Comment: (NOTE) The GeneXpert MRSA Assay (FDA approved for NASAL specimens only), is one component of a comprehensive MRSA colonization surveillance program. It is not intended to diagnose MRSA infection nor to guide or monitor treatment for MRSA infections. Test performance is not FDA approved in patients less than 22 years old. Performed at [facility], [place] [address], [place], [place] [address]   Expectorated Sputum Assessment w Gram Stain, Rflx to Resp Cult     Status: None   Collection Time: 11/01/22 11:56 AM   Specimen: Expectorated Sputum  Result Value Ref Range Status   Specimen Description EXPECTORATED SPUTUM  Final   Special Requests Normal  Final   Sputum evaluation   Final    THIS SPECIMEN IS ACCEPTABLE FOR SPUTUM CULTURE Performed at [facility], [place] [address], [place], [place] [address]    Report Status 11/01/2022 FINAL  Final  Culture,  Respiratory w Gram Stain     Status: None   Collection Time: 11/01/22 11:56 AM  Result Value Ref Range Status   Specimen Description   Final    EXPECTORATED SPUTUM Performed at [facility], [place] [address], [place], [place] [address]    Special Requests   Final    Normal Reflexed from [id number] Performed at [facility], [place] [address] [place], [place] [address]    Gram Stain NO WBC SEEN RARE GRAM POSITIVE COCCI   Final   Culture   Final    FEW Normal respiratory flora-no Staph aureus or Pseudomonas seen Performed at [facility], [place] [address] [place],  [address]    Report Status 11/03/2022 FINAL  Final      Radiology Studies: No results found.    Scheduled Meds:  aspirin  81 mg Oral Daily   clopidogrel  75 mg Oral Daily   dextromethorphan-guaiFENesin  1 tablet Oral BID   enoxaparin (LOVENOX) injection  40 mg Subcutaneous Q24H   hydrALAZINE  25 mg Oral Q8H   insulin aspart  0-15 Units Subcutaneous TID WC   insulin aspart  0-5 Units Subcutaneous QHS   ipratropium-albuterol  3 mL Nebulization TID   [START ON 11/06/2022] methylPREDNISolone (SOLU-MEDROL) injection  40 mg Intravenous Daily   metoprolol succinate  25 mg Oral Daily   mometasone-formoterol  2 puff Inhalation BID   Continuous Infusions:     LOS: 7 days   Time spent: 25 minutes   Dessa Phi, DO [facility] 11/05/2022, 12:07 PM   Available via Epic secure chat 7am-7pm After these hours, please refer to coverage provider listed on [email or web address]

## 2022-11-05 NOTE — Progress Notes (Signed)
Patient FIO2 was decreased to 4L [place]. Patient appears to be tolerating well at this time

## 2022-11-05 NOTE — Plan of Care (Signed)
  Problem: Education: Goal: Knowledge of General Education information will improve Description Including pain rating scale, medication(s)/side effects and non-pharmacologic comfort measures Outcome: Progressing   Problem: Health Behavior/Discharge Planning: Goal: Ability to manage health-related needs will improve Outcome: Progressing   

## 2022-11-06 DIAGNOSIS — A419 Sepsis, unspecified organism: Secondary | ICD-10-CM | POA: Diagnosis not present

## 2022-11-06 DIAGNOSIS — R6520 Severe sepsis without septic shock: Secondary | ICD-10-CM | POA: Diagnosis not present

## 2022-11-06 LAB — GLUCOSE, CAPILLARY
Glucose-Capillary: 83 mg/dL (ref 70–99)
Glucose-Capillary: 109 mg/dL — ABNORMAL HIGH (ref 70–99)
Glucose-Capillary: 128 mg/dL — ABNORMAL HIGH (ref 70–99)
Glucose-Capillary: 130 mg/dL — ABNORMAL HIGH (ref 70–99)

## 2022-11-06 MED ORDER — PREDNISONE 20 MG PO TABS
40.0000 mg | ORAL_TABLET | Freq: Every day | ORAL | Status: DC
  Administered 2022-11-07: 40 mg via ORAL
  Filled 2022-11-06: qty 2

## 2022-11-06 MED ORDER — FUROSEMIDE 10 MG/ML IJ SOLN
40.0000 mg | Freq: Once | INTRAMUSCULAR | Status: AC
  Administered 2022-11-06: 40 mg via INTRAVENOUS
  Filled 2022-11-06: qty 4

## 2022-11-06 NOTE — Progress Notes (Signed)
PROGRESS NOTE    Jonathan [name]  [id number] DOB: 06/30/1951 DOA: 10/29/2022 PCP: Leonard Downing, MD     Brief Narrative:  Jonathan [name] is a 72 y.o. male with medical history significant for COPD on room air at baseline, hypertension, hyperlipidemia, CAD status post CABG being admitted to the hospital with severe sepsis due to RU and RML community-acquired pneumonia complicated by acute exacerbation of COPD.  His oxygen saturation at urgent care was 84%.  He was placed on oxygen, transferred to the emergency department by EMS, he was given 3 DuoNebs, 125 mg Solu-Medrol and 2 g of magnesium en route. He was initially requiring BiPAP, weaned down to 4 L high flow nasal cannula O2.  New events last 24 hours / Subjective: Doing ok. Has been ambulating. Remains on 4L O2. Still coughing a lot.   Assessment & Plan:  Principal Problem:   Severe sepsis ([place]) Active Problems:   Essential hypertension, benign   Hyperlipidemia   Obesity (BMI 30.0-34.9)   S/P CABG x 4   COPD with acute exacerbation (HCC)   Leukocytosis   CAP (community acquired pneumonia)   Lactic acid acidosis   Hyponatremia   Streptococcus pneumoniae   HTN (hypertension)   Severe sepsis secondary to multifocal pneumonia, strep pneumo -Severe sepsis present on admission -Completed antibiotic  Acute hypoxemic respiratory failure secondary to pneumonia, COPD exacerbation -Currently requiring 6 L nasal cannula O2 -Continue Solu-Medrol --> prednisone tomorrow, breathing treatment -Completed antibiotic -Need to be set up with home oxygen for discharge -Some edema LE. IV lasix today   Hypertension -Metoprolol, hydralazine  CAD status post CABG -Aspirin, Plavix  Obesity -BMI 32  DVT prophylaxis:  Place TED hose Start: 11/06/22 0838 enoxaparin (LOVENOX) injection 40 mg Start: 10/29/22 2200 SCDs Start: 10/29/22 1451  Code Status: DNR Family Communication: No family at bedside Disposition Plan:  Status  is: Inpatient Remains inpatient appropriate because: respiratory failure     Antimicrobials:  Anti-infectives (From admission, onward)    Start     Dose/Rate Route Frequency Ordered Stop   11/01/22 1000  azithromycin (ZITHROMAX) tablet 500 mg        500 mg Oral Daily 11/01/22 0856 11/02/22 1217   10/29/22 1200  cefTRIAXone (ROCEPHIN) 2 g in sodium chloride 0.9 % 100 mL IVPB        2 g 200 mL/hr over 30 Minutes Intravenous Every 24 hours 10/29/22 1149 11/02/22 1249   10/29/22 1200  azithromycin (ZITHROMAX) 500 mg in sodium chloride 0.9 % 250 mL IVPB  Status:  Discontinued        500 mg 250 mL/hr over 60 Minutes Intravenous Every 24 hours 10/29/22 1149 11/01/22 0855        Objective: Vitals:   11/05/22 2010 11/05/22 2207 11/06/22 0415 11/06/22 0902  BP:  130/65 (!) 155/77   Pulse:  (!) 59 (!) 57   Resp:  18    Temp:  98.3 F (36.8 C) 98.3 F (36.8 C)   TempSrc:  Oral Oral   SpO2: 96% 95% 96% 98%  Weight:      Height:        Intake/Output Summary (Last 24 hours) at 11/06/2022 0959 Last data filed at 11/06/2022 0825 Gross per 24 hour  Intake 720 ml  Output 1800 ml  Net -1080 ml    Filed Weights   10/29/22 1106 10/29/22 2212  Weight: 90.7 kg 94.9 kg    Examination:  General exam: Appears calm  Respiratory system: Diminished  breath sounds, on 4 L oxygen without distress  Cardiovascular system: S1 & S2 heard, RRR. No murmurs.  +Bilateral pitting pedal edema. Gastrointestinal system: Abdomen is nondistended, soft and nontender. Normal bowel sounds heard. Central nervous system: Alert and oriented. No focal neurological deficits. Speech clear.  Extremities: Symmetric in appearance  Skin: No rashes, lesions or ulcers on exposed skin  Psychiatry: Judgement and insight appear normal. Mood & affect appropriate.   Data Reviewed: I have personally reviewed following labs and imaging studies  CBC: Recent Labs  Lab 10/31/22 1248 11/02/22 0317  WBC 23.4* 9.9  HGB 15.5  14.6  HCT 47.9 45.7  MCV 102.4* 100.4*  PLT 214 [id number]    Basic Metabolic Panel: Recent Labs  Lab 10/31/22 0331 11/01/22 0330 11/02/22 0317 11/04/22 0359 11/05/22 0457  NA 134* 139 137 135  --   K 4.8 4.3 4.8 4.3  --   CL 107 106 104 97*  --   CO2 17* '25 24 29  '$ --   GLUCOSE 115* 126* 106* 96  --   BUN 34* 40* 38* 23  --   CREATININE 0.94 1.07 1.03 0.94 0.93  CALCIUM 7.7* 7.8* 8.0* 7.7*  --   MG  --  2.5*  --   --   --     GFR: Estimated Creatinine Clearance: 77.6 mL/min (by C-G formula based on SCr of 0.93 mg/dL). Liver Function Tests: No results for input(s): "AST", "ALT", "ALKPHOS", "BILITOT", "PROT", "ALBUMIN" in the last 168 hours.  No results for input(s): "LIPASE", "AMYLASE" in the last 168 hours. No results for input(s): "AMMONIA" in the last 168 hours. Coagulation Profile: No results for input(s): "INR", "PROTIME" in the last 168 hours.  Cardiac Enzymes: No results for input(s): "CKTOTAL", "CKMB", "CKMBINDEX", "TROPONINI" in the last 168 hours. BNP (last 3 results) No results for input(s): "PROBNP" in the last 8760 hours. HbA1C: No results for input(s): "HGBA1C" in the last 72 hours. CBG: Recent Labs  Lab 11/05/22 1105 11/05/22 1620 11/05/22 2017 11/05/22 2230 11/06/22 0740  GLUCAP 97 144* 114* 88 83    Lipid Profile: No results for input(s): "CHOL", "HDL", "LDLCALC", "TRIG", "CHOLHDL", "LDLDIRECT" in the last 72 hours. Thyroid Function Tests: No results for input(s): "TSH", "T4TOTAL", "FREET4", "T3FREE", "THYROIDAB" in the last 72 hours. Anemia Panel: No results for input(s): "VITAMINB12", "FOLATE", "FERRITIN", "TIBC", "IRON", "RETICCTPCT" in the last 72 hours. Sepsis Labs: Recent Labs  Lab 10/31/22 1248  PROCALCITON 5.22     Recent Results (from the past 240 hour(s))  Resp panel by RT-PCR (RSV, Flu A&B, Covid) Anterior Nasal Swab     Status: None   Collection Time: 10/29/22 11:08 AM   Specimen: Anterior Nasal Swab  Result Value Ref Range  Status   SARS Coronavirus 2 by RT PCR NEGATIVE NEGATIVE Final    Comment: (NOTE) SARS-CoV-2 target nucleic acids are NOT DETECTED.  The SARS-CoV-2 RNA is generally detectable in upper respiratory specimens during the acute phase of infection. The lowest concentration of SARS-CoV-2 viral copies this assay can detect is 138 copies/mL. A negative result does not preclude SARS-Cov-2 infection and should not be used as the sole basis for treatment or other patient management decisions. A negative result may occur with  improper specimen collection/handling, submission of specimen other than nasopharyngeal swab, presence of viral mutation(s) within the areas targeted by this assay, and inadequate number of viral copies(<138 copies/mL). A negative result must be combined with clinical observations, patient history, and epidemiological information. The expected result  is Negative.  Fact Sheet for Patients:  EntrepreneurPulse.com.au  Fact Sheet for Healthcare Providers:  IncredibleEmployment.be  This test is no t yet approved or cleared by the [place] FDA and  has been authorized for detection and/or diagnosis of SARS-CoV-2 by FDA under an Emergency Use Authorization (EUA). This EUA will remain  in effect (meaning this test can be used) for the duration of the COVID-19 declaration under Section 564(b)(1) of the Act, 21 U.S.C.section 360bbb-3(b)(1), unless the authorization is terminated  or revoked sooner.       Influenza A by PCR NEGATIVE NEGATIVE Final   Influenza B by PCR NEGATIVE NEGATIVE Final    Comment: (NOTE) The Xpert Xpress SARS-CoV-2/FLU/RSV plus assay is intended as an aid in the diagnosis of influenza from Nasopharyngeal swab specimens and should not be used as a sole basis for treatment. Nasal washings and aspirates are unacceptable for Xpert Xpress SARS-CoV-2/FLU/RSV testing.  Fact Sheet for  Patients: EntrepreneurPulse.com.au  Fact Sheet for Healthcare Providers: IncredibleEmployment.be  This test is not yet approved or cleared by the [place] FDA and has been authorized for detection and/or diagnosis of SARS-CoV-2 by FDA under an Emergency Use Authorization (EUA). This EUA will remain in effect (meaning this test can be used) for the duration of the COVID-19 declaration under Section 564(b)(1) of the Act, 21 U.S.C. section 360bbb-3(b)(1), unless the authorization is terminated or revoked.     Resp Syncytial Virus by PCR NEGATIVE NEGATIVE Final    Comment: (NOTE) Fact Sheet for Patients: EntrepreneurPulse.com.au  Fact Sheet for Healthcare Providers: IncredibleEmployment.be  This test is not yet approved or cleared by the [place] FDA and has been authorized for detection and/or diagnosis of SARS-CoV-2 by FDA under an Emergency Use Authorization (EUA). This EUA will remain in effect (meaning this test can be used) for the duration of the COVID-19 declaration under Section 564(b)(1) of the Act, 21 U.S.C. section 360bbb-3(b)(1), unless the authorization is terminated or revoked.  Performed at [facility], [place] [address], [place], [place] [address]   Blood Culture (routine x 2)     Status: None   Collection Time: 10/29/22 12:01 PM   Specimen: BLOOD RIGHT HAND  Result Value Ref Range Status   Specimen Description   Final    BLOOD RIGHT HAND Performed at [facility], [place] [address] [place], [place] [address]    Special Requests   Final    BOTTLES DRAWN AEROBIC AND ANAEROBIC Blood Culture results may not be optimal due to an inadequate volume of blood received in culture bottles Performed at [place] [address], [place], [place] [address]    Culture   Final    NO GROWTH 5 DAYS Performed at [facility], [place] [address], [place], [place] [address]    Report Status 11/03/2022 FINAL  Final  Blood Culture (routine x 2)     Status: None   Collection Time: 10/29/22 12:13 PM   Specimen: BLOOD  Result Value Ref Range Status   Specimen Description   Final    BLOOD RIGHT ANTECUBITAL Performed at [place] [address] [place], [place] [address]    Special Requests   Final    BOTTLES DRAWN AEROBIC AND ANAEROBIC Blood Culture adequate volume Performed at [place] [address], [place], [place] [address]    Culture   Final    NO GROWTH 5 DAYS Performed at [facility], [address], [place], [place] [address]    Report Status 11/03/2022 FINAL  Final  MRSA Next Gen by PCR, Nasal     Status: None   Collection Time: 10/29/22  9:43 PM   Specimen: Nasal Mucosa; Nasal Swab  Result Value Ref Range Status   MRSA by PCR Next Gen NOT DETECTED NOT DETECTED Final    Comment: (NOTE) The GeneXpert MRSA Assay (FDA approved for NASAL specimens only), is one component of a comprehensive MRSA colonization surveillance program. It is not intended to diagnose MRSA infection nor to guide or monitor treatment for MRSA infections. Test performance is not FDA approved in patients less than 72 years old. Performed at [facility], [place] [address], [place], [place] [address]   Expectorated Sputum Assessment w Gram Stain, Rflx to Resp Cult     Status: None   Collection Time: 11/01/22 11:56 AM   Specimen: Expectorated Sputum  Result Value Ref Range Status   Specimen Description EXPECTORATED SPUTUM  Final   Special Requests Normal  Final   Sputum evaluation   Final    THIS SPECIMEN IS ACCEPTABLE FOR SPUTUM CULTURE Performed at [facility], [place] [address], [place], [place] [address]    Report Status 11/01/2022 FINAL  Final  Culture, Respiratory w Gram Stain     Status: None   Collection Time: 11/01/22 11:56 AM  Result Value Ref  Range Status   Specimen Description   Final    EXPECTORATED SPUTUM Performed at [facility], [place] [address], [place], [place] [address]    Special Requests   Final    Normal Reflexed from [id number] Performed at [facility], [place] [address], [place], [place] [address]    Gram Stain NO WBC SEEN RARE GRAM POSITIVE COCCI   Final   Culture   Final    FEW Normal respiratory flora-no Staph aureus or Pseudomonas seen Performed at [facility], [place] [address], [place],  [address]    Report Status 11/03/2022 FINAL  Final      Radiology Studies: No results found.    Scheduled Meds:  aspirin  81 mg Oral Daily   clopidogrel  75 mg Oral Daily   dextromethorphan-guaiFENesin  1 tablet Oral BID   enoxaparin (LOVENOX) injection  40 mg Subcutaneous Q24H   hydrALAZINE  25 mg Oral Q8H   insulin aspart  0-15 Units Subcutaneous TID WC   insulin aspart  0-5 Units Subcutaneous QHS   ipratropium-albuterol  3 mL Nebulization TID   methylPREDNISolone (SOLU-MEDROL) injection  40 mg Intravenous Daily   metoprolol succinate  25 mg Oral Daily   mometasone-formoterol  2 puff Inhalation BID   Continuous Infusions:     LOS: 8 days   Time spent: 25 minutes   Dessa Phi, DO [facility] 11/06/2022, 9:59 AM   Available via Epic secure chat 7am-7pm After these hours, please refer to coverage provider listed on [email or web address]

## 2022-11-06 NOTE — Progress Notes (Cosign Needed Addendum)
SATURATION QUALIFICATIONS: (This note is used to comply with regulatory documentation for home oxygen)  Patient Saturations on Room Air at Rest = 86%  Patient Saturations on 4 Liters of oxygen while Ambulating = 91-93%  Please briefly explain why patient needs home oxygen: Patient ambulated a distance of 76f on 4L, maintaining O2 sats between 91-93%.

## 2022-11-06 NOTE — TOC Progression Note (Addendum)
Transition of Care [facility]) - Progression Note    Patient Details  Name: LESLEE LEHR MRN: [id number] Date of Birth: 1950-10-05  Transition of Care [facility]) CM/SW Contact  Henrietta Dine, RN Phone Number: 11/06/2022, 4:17 PM  Clinical Narrative:    TOC also consulted for medication assistance; pt says he does not need this assistance.; orders received for home oxygen; spoke w/ pt in room and he agrees to service; explained to pt travel tank will be delivered to room prior to d/c, and equip will be set up in home; he verbalized understanding; pt verified address [address], [place],  [address]; he would like agency to contact his wife Clements Ohayon [phone]); no sat note notified [place], RN note needed and must be co-signed by MD; awaiting saturation note before setting up service.  [phone] spoke w/ Brenton Grills at [facility] and he says agency can provide service; he was also given contact info for pt's wife for set up; agency will deliver travel tank to pt's room; pt given name of agency and agency contact information placed in follow up provider section of d/c instructions.   Expected Discharge Plan: [place] Barriers to Discharge: Continued Medical Work up  Expected Discharge Plan and Services In-house Referral: NA Discharge Planning Services: CM Consult Post Acute Care Choice: [place] arrangements for the past 2 months: Single Family Home                 DME Arranged: N/A DME Agency: NA       HH Arranged: PT, OT   Date HH Agency Contacted: 11/02/22 Time Rule: [id number] Representative spoke with at [place]: kelly   Social Determinants of Health ([place]) Interventions SDOH Screenings   Food Insecurity: No Food Insecurity (10/29/2022)  Housing: Low Risk  (10/29/2022)  Transportation Needs: No Transportation Needs (10/29/2022)  Utilities: Not At Risk (10/29/2022)  Tobacco Use: Medium Risk (10/29/2022)    Readmission  Risk Interventions    11/01/2022    9:52 AM 10/25/2020   10:55 AM  Readmission Risk Prevention Plan  Transportation Screening Complete Complete  PCP or Specialist Appt within 5-7 Days  Not Complete  Not Complete comments  pt going to [place] rehab  PCP or Specialist Appt within 3-5 Days Complete   Home Care Screening  Complete  Medication Review (RN CM)  Complete  HRI or Home Care Consult Complete   Social Work Consult for [place] Planning/Counseling Complete   Palliative Care Screening Not Applicable   Medication Review Press photographer) Complete

## 2022-11-06 NOTE — Progress Notes (Signed)
Occupational Therapy Treatment Patient Details Name: Jonathan [name] MRN: [id number] DOB: July 31, 1951 Today's Date: 11/06/2022   History of present illness Pt is 72 yo male admitted 10/29/22 with sepsis, resp failure, PNE, and COPD exacerbation.  Pt with hx including but not limited to COPD on room air at baseline, hypertension, hyperlipidemia, CAD status post CABG   OT comments  Patient found supine in bed on 4 L [place]. Patient supervision to transfer to edge of bed. He required assistance to don shoes from his wife. From a physical standpoint - the ability to stand, let go of walker, bend and activity tolerance - he demonstrates the ability to perform his ADLs. However, he is okay to accept assistance from nursing and spouse. When asked about doing ADLs he declined and wanting to focus on "walking." He ambulated the entire unit on 4 L [place] and o2 sats between 88-92% with no complaints of shortness of breathe or fatigue. From a functional standpoint he is doing well despite his oxygen needs. He has assistance of his wife at home. Defer to PT for further therapy needs.    Recommendations for follow up therapy are one component of a multi-disciplinary discharge planning process, led by the attending physician.  Recommendations may be updated based on patient status, additional functional criteria and insurance authorization.    Follow Up Recommendations  No OT follow up     Assistance Recommended at Discharge Intermittent Supervision/Assistance  Patient can return home with the following  Assistance with cooking/housework;Help with stairs or ramp for entrance   Equipment Recommendations  None recommended by OT    Recommendations for Other Services      Precautions / Restrictions Precautions Precautions: Fall Precaution Comments: monitor vitals Restrictions Weight Bearing Restrictions: No       Mobility Bed Mobility Overal bed mobility: Needs Assistance Bed Mobility: Supine to Sit      Supine to sit: Supervision, HOB elevated     General bed mobility comments: Supervision to transfer to edge of bed    Transfers Overall transfer level: Needs assistance Equipment used: Rolling walker (2 wheels) Transfers: Sit to/from Stand Sit to Stand: Supervision           General transfer comment: Mostly superivsion to ambulate the entire unit on 4 L [place]. O2 sats wavered initialy at 88% but think due to hand being tightened on walker. At end of ambulation his sat was 92%.     Balance Overall balance assessment: Mild deficits observed, not formally tested                                         ADL either performed or assessed with clinical judgement   ADL                                         General ADL Comments: Declined ADLs and wanting to focus on ambulation. Patient's wife assisted patient with donning shoes.    Extremity/Trunk Assessment Upper Extremity Assessment Upper Extremity Assessment: Overall WFL for tasks assessed            Vision Patient Visual Report: No change from baseline     Perception     Praxis      Cognition Arousal/Alertness: Awake/alert Behavior During Therapy: WFL for tasks assessed/performed  Exercises      Shoulder Instructions       General Comments      Pertinent Vitals/ Pain       Pain Assessment Pain Assessment: Faces Faces Pain Scale: Hurts little more Pain Location: ankles Pain Descriptors / Indicators: Grimacing Pain Intervention(s): Limited activity within patient's tolerance  Home Living                                          Prior Functioning/Environment              Frequency           Progress Toward Goals  OT Goals(current goals can now be found in the care plan section)  Progress towards OT goals: Goals met/education completed, patient discharged from [place]  Other (comment) (patient wants to focus on mobility, not ADLs)    Co-evaluation                 AM-PAC OT "6 Clicks" Daily Activity     Outcome Measure   Help from another person eating meals?: None Help from another person taking care of personal grooming?: A Little Help from another person toileting, which includes using toliet, bedpan, or urinal?: A Little Help from another person bathing (including washing, rinsing, drying)?: A Little Help from another person to put on and taking off regular upper body clothing?: A Little Help from another person to put on and taking off regular lower body clothing?: A Lot 6 Click Score: 18    End of Session Equipment Utilized During Treatment: Oxygen  OT Visit Diagnosis: Muscle weakness (generalized) (M62.81)   Activity Tolerance Patient tolerated treatment well   Patient Left in chair;with call bell/phone within reach;with family/visitor present;with nursing/sitter in room   Nurse Communication Mobility status        Time: [id number] OT Time Calculation (min): 26 min  Charges: OT General Charges $OT Visit: 1 Visit OT Evaluation $OT Eval Low Complexity: 1 Low OT Treatments $Therapeutic Activity: 23-37 mins  Gustavo Lah, OTR/L Acute Care Rehab Services  Office [phone]   Jonathan [name] 11/06/2022, 1:03 PM

## 2022-11-07 DIAGNOSIS — A419 Sepsis, unspecified organism: Secondary | ICD-10-CM | POA: Diagnosis not present

## 2022-11-07 DIAGNOSIS — R6520 Severe sepsis without septic shock: Secondary | ICD-10-CM | POA: Diagnosis not present

## 2022-11-07 LAB — BASIC METABOLIC PANEL
Anion gap: 10 (ref 5–15)
BUN: 20 mg/dL (ref 8–23)
CO2: 29 mmol/L (ref 22–32)
Calcium: 8 mg/dL — ABNORMAL LOW (ref 8.9–10.3)
Chloride: 95 mmol/L — ABNORMAL LOW (ref 98–111)
Creatinine, Ser: 0.79 mg/dL (ref 0.61–1.24)
GFR, Estimated: >60 mL/min (ref >60)
Glucose, Bld: 99 mg/dL (ref 70–99)
Potassium: 3.6 mmol/L (ref 3.5–5.1)
Sodium: 134 mmol/L — ABNORMAL LOW (ref 135–145)

## 2022-11-07 LAB — GLUCOSE, CAPILLARY
Glucose-Capillary: 83 mg/dL (ref 70–99)
Glucose-Capillary: 97 mg/dL (ref 70–99)

## 2022-11-07 MED ORDER — FUROSEMIDE 40 MG PO TABS: 40.0000 mg | ORAL_TABLET | Freq: Every day | ORAL | 1 refills | Status: DC

## 2022-11-07 MED ORDER — POTASSIUM CHLORIDE ER 10 MEQ PO TBCR: 20.0000 meq | EXTENDED_RELEASE_TABLET | Freq: Every day | ORAL | 1 refills | Status: DC

## 2022-11-07 MED ORDER — PREDNISONE 10 MG PO TABS: ORAL_TABLET | ORAL | 0 refills | Status: DC

## 2022-11-07 MED ORDER — POTASSIUM CHLORIDE CRYS ER 20 MEQ PO TBCR
40.0000 meq | EXTENDED_RELEASE_TABLET | Freq: Once | ORAL | Status: AC
  Administered 2022-11-07: 40 meq via ORAL
  Filled 2022-11-07: qty 2

## 2022-11-07 MED ORDER — HYDRALAZINE HCL 25 MG PO TABS: 25.0000 mg | ORAL_TABLET | Freq: Three times a day (TID) | ORAL | 1 refills | Status: DC

## 2022-11-07 MED ORDER — ORAL CARE MOUTH RINSE: 15.0000 mL | OROMUCOSAL | Status: DC | PRN

## 2022-11-07 MED ORDER — FUROSEMIDE 10 MG/ML IJ SOLN
40.0000 mg | Freq: Once | INTRAMUSCULAR | Status: AC
  Administered 2022-11-07: 40 mg via INTRAVENOUS
  Filled 2022-11-07: qty 4

## 2022-11-07 MED ORDER — IPRATROPIUM-ALBUTEROL 0.5-2.5 (3) MG/3ML IN SOLN: 3.0000 mL | Freq: Two times a day (BID) | RESPIRATORY_TRACT | Status: DC

## 2022-11-07 MED ORDER — ALBUTEROL SULFATE HFA 108 (90 BASE) MCG/ACT IN AERS: 1.0000 | INHALATION_SPRAY | Freq: Four times a day (QID) | RESPIRATORY_TRACT | 2 refills | Status: DC | PRN

## 2022-11-07 MED ORDER — METOPROLOL SUCCINATE ER 25 MG PO TB24: 25.0000 mg | ORAL_TABLET | Freq: Every day | ORAL | 1 refills | Status: DC

## 2022-11-07 NOTE — Discharge Summary (Signed)
Physician Discharge Summary  JREAM MCQUERRY [id number] DOB: 1951-08-28 DOA: 10/29/2022  PCP: Leonard Downing, MD  Admit date: 10/29/2022 Discharge date: 11/07/2022  Admitted From: Home Disposition:  Home  Recommendations for Outpatient Follow-up:  Follow up with PCP in 1 week  Discharge Condition: Stable CODE STATUS: DNR Diet recommendation: Heart healthy diet  Brief/Interim Summary: LEMUEL KREINER is a 72 y.o. male with medical history significant for COPD on room air at baseline, hypertension, hyperlipidemia, CAD status post CABG being admitted to the hospital with severe sepsis due to RU and RML community-acquired pneumonia complicated by acute exacerbation of COPD.  His oxygen saturation at urgent care was 84%.  He was placed on oxygen, transferred to the emergency department by EMS, he was given 3 DuoNebs, 125 mg Solu-Medrol and 2 g of magnesium en route. He was initially requiring BiPAP, weaned down to 4 L high flow nasal cannula O2.  Patient completed IV antibiotic therapy.  He remained on IV Solu-Medrol which was transitioned to prednisone.  He also received IV Lasix due to peripheral edema.  He was discharged home in stable and improved condition with home oxygen set up.  Discharge Diagnoses:  Principal Problem:   Severe sepsis ([place]) Active Problems:   Essential hypertension, benign   Hyperlipidemia   Obesity (BMI 30.0-34.9)   S/P CABG x 4   COPD with acute exacerbation (HCC)   Leukocytosis   CAP (community acquired pneumonia)   Lactic acid acidosis   Hyponatremia   Streptococcus pneumoniae   HTN (hypertension)   Severe sepsis secondary to multifocal pneumonia, strep pneumo -Severe sepsis present on admission -Completed antibiotic   Acute hypoxemic respiratory failure secondary to pneumonia, COPD exacerbation -Currently requiring 4 L nasal cannula O2 --> home oxygen set up -Continue Solu-Medrol --> prednisone taper, breathing treatment -Completed  antibiotic  Peripheral edema -Lasix, TED hose   Hypertension -Metoprolol, hydralazine   CAD status post CABG -Aspirin, Plavix   Obesity -BMI 32  Discharge Instructions  Discharge Instructions     Call MD for:  difficulty breathing, headache or visual disturbances   Complete by: As directed    Call MD for:  extreme fatigue   Complete by: As directed    Call MD for:  persistant dizziness or light-headedness   Complete by: As directed    Call MD for:  persistant nausea and vomiting   Complete by: As directed    Call MD for:  severe uncontrolled pain   Complete by: As directed    Call MD for:  temperature >100.4   Complete by: As directed    Diet - low sodium heart healthy   Complete by: As directed    Discharge instructions   Complete by: As directed    You were cared for by a hospitalist during your hospital stay. If you have any questions about your discharge medications or the care you received while you were in the hospital after you are discharged, you can call the unit and ask to speak with the hospitalist on call if the hospitalist that took care of you is not available. Once you are discharged, your primary care physician will handle any further medical issues. Please note that NO REFILLS for any discharge medications will be authorized once you are discharged, as it is imperative that you return to your primary care physician (or establish a relationship with a primary care physician if you do not have one) for your aftercare needs so that they can reassess your  need for medications and monitor your lab values.   Increase activity slowly   Complete by: As directed       Allergies as of 11/07/2022       Reactions   Niaspan [niacin Er] Itching, Other (See Comments)   insomnia   Statins    LFT elevavation, CK elevevation        Medication List     STOP taking these medications    acetaminophen 325 MG tablet Commonly known as: TYLENOL       TAKE these  medications    albuterol 108 (90 Base) MCG/ACT inhaler Commonly known as: VENTOLIN HFA Inhale 1-2 puffs into the lungs every 6 (six) hours as needed for wheezing or shortness of breath.   aspirin 81 MG chewable tablet Chew 1 tablet (81 mg total) by mouth daily.   clopidogrel 75 MG tablet Commonly known as: PLAVIX TAKE 1 TABLET BY MOUTH  DAILY   furosemide 40 MG tablet Commonly known as: Lasix Take 1 tablet (40 mg total) by mouth daily.   guaiFENesin 200 MG tablet Take 1 tablet (200 mg total) by mouth every 6 (six) hours as needed for cough or to loosen phlegm.   hydrALAZINE 25 MG tablet Commonly known as: APRESOLINE Take 1 tablet (25 mg total) by mouth every 8 (eight) hours.   metoprolol succinate 25 MG 24 hr tablet Commonly known as: TOPROL-XL Take 1 tablet (25 mg total) by mouth daily. Start taking on: November 08, 2022 What changed: how much to take   nitroGLYCERIN 0.4 MG SL tablet Commonly known as: NITROSTAT PLACE 1 TABLET UNDER THE TONGUE EVERY 5 MINUTES AS NEEDED FOR CHEST PAIN UP TO 3 DOSES, IF SYMPTOMS PERSIST CALL 911   potassium chloride 10 MEQ tablet Commonly known as: Klor-Con 10 Take 2 tablets (20 mEq total) by mouth daily.   predniSONE 10 MG tablet Commonly known as: DELTASONE Take 4 tabs for 3 days, then 3 tabs for 3 days, then 2 tabs for 3 days, then 1 tab for 3 days, then 1/2 tab for 4 days.   Repatha SureClick [id number] MG/ML Soaj Generic drug: Evolocumab INJECT 140 MG INTO THE SKIN EVERY 14 DAYS   sodium chloride 0.65 % Soln nasal spray Commonly known as: OCEAN Place 1 spray into both nostrils 2 (two) times daily at 8 am and 10 pm.   Symbicort 160-4.5 MCG/ACT inhaler Generic drug: budesonide-formoterol INHALE 2 PUFFS INTO THE LUNGS TWICE DAILY               Durable Medical Equipment  (From admission, onward)           Start     Ordered   11/06/22 0723  For home use only DME oxygen  Once       Question Answer Comment  Length of Need  6 Months   Mode or (Route) Nasal cannula   Liters per Minute 4   Frequency Continuous (stationary and portable oxygen unit needed)   Oxygen delivery system Gas      11/06/22 0722            Follow-up Information     [facility] [place] Follow up.   Specialty: [place] Why: A representative from [facility] will contact you to arrange start dae and time for your therapies. Contact information: [address] [place] [address] [phone]         [facility] Follow up.   Contact  information: [place] [phone]                Allergies  Allergen Reactions   Niaspan [Niacin Er] Itching and Other (See Comments)    insomnia   Statins     LFT elevavation, CK elevevation     Procedures/Studies: DG CHEST PORT 1 VIEW  Result Date: 11/03/2022 CLINICAL DATA:  COPD EXAM: PORTABLE CHEST 1 VIEW COMPARISON:  10/29/2022 FINDINGS: Consolidation within the right mid lung zone has improved though residual infiltrate persists. Small right pleural effusion is unchanged. Left lung is clear. No pneumothorax. No pleural effusion on the left. Coronary artery bypass grafting has been performed. Cardiac size within normal limits. No acute bone abnormality. IMPRESSION: 1. Improving right mid lung zone infiltrate. Follow-up chest radiograph in 3-4 weeks is recommended to document complete resolution. 2. Stable small right pleural effusion. Electronically Signed   By: Fidela Salisbury M.D.   On: 11/03/2022 09:48   DG Chest 2 View  Result Date: 10/29/2022 CLINICAL DATA:  Provided history: Shortness of breath. Productive cough. EXAM: CHEST - 2 VIEW COMPARISON:  Chest radiograph 06/01/2022 and earlier. FINDINGS: Prior median sternotomy. Heart size within normal is. Extensive airspace opacity within the right upper lobe compatible with pneumonia. To a lesser extent, airspace opacities are also present within the  right middle lobe, also consistent with pneumonia. Small right pleural effusion. No appreciable airspace consolidation on the left. No evidence of pneumothorax. No acute bony abnormality identified. IMPRESSION: Findings consistent with right upper and middle lobe pneumonia. Followup PA and lateral chest radiographs are recommended in 3-4 weeks following a trial of antibiotic therapy to ensure resolution and exclude underlying malignancy. Associated small right pleural effusion. Electronically Signed   By: Kellie Simmering D.O.   On: 10/29/2022 11:44       Discharge Exam: Vitals:   11/07/22 1020 11/07/22 1054  BP: (!) 107/57 115/65  Pulse: 67 60  Resp:    Temp:    SpO2:      General: Pt is alert, awake, not in acute distress Cardiovascular: RRR, S1/S2 +, + bilateral pedal edema Respiratory: CTA bilaterally, no wheezing, no rhonchi, no respiratory distress, no conversational dyspnea, on 4 L oxygen Abdominal: Soft, NT, ND, bowel sounds + Extremities: Bilateral pedal edema, no cyanosis Psych: Normal mood and affect, stable judgement and insight     The results of significant diagnostics from this hospitalization (including imaging, microbiology, ancillary and laboratory) are listed below for reference.     Microbiology: Recent Results (from the past 240 hour(s))  Resp panel by RT-PCR (RSV, Flu A&B, Covid) Anterior Nasal Swab     Status: None   Collection Time: 10/29/22 11:08 AM   Specimen: Anterior Nasal Swab  Result Value Ref Range Status   SARS Coronavirus 2 by RT PCR NEGATIVE NEGATIVE Final    Comment: (NOTE) SARS-CoV-2 target nucleic acids are NOT DETECTED.  The SARS-CoV-2 RNA is generally detectable in upper respiratory specimens during the acute phase of infection. The lowest concentration of SARS-CoV-2 viral copies this assay can detect is 138 copies/mL. A negative result does not preclude SARS-Cov-2 infection and should not be used as the sole basis for treatment or other  patient management decisions. A negative result may occur with  improper specimen collection/handling, submission of specimen other than nasopharyngeal swab, presence of viral mutation(s) within the areas targeted by this assay, and inadequate number of viral copies(<138 copies/mL). A negative result must be combined with clinical observations,  patient history, and epidemiological information. The expected result is Negative.  Fact Sheet for Patients:  EntrepreneurPulse.com.au  Fact Sheet for Healthcare Providers:  IncredibleEmployment.be  This test is no t yet approved or cleared by the [place] FDA and  has been authorized for detection and/or diagnosis of SARS-CoV-2 by FDA under an Emergency Use Authorization (EUA). This EUA will remain  in effect (meaning this test can be used) for the duration of the COVID-19 declaration under Section 564(b)(1) of the Act, 21 U.S.C.section 360bbb-3(b)(1), unless the authorization is terminated  or revoked sooner.       Influenza A by PCR NEGATIVE NEGATIVE Final   Influenza B by PCR NEGATIVE NEGATIVE Final    Comment: (NOTE) The Xpert Xpress SARS-CoV-2/FLU/RSV plus assay is intended as an aid in the diagnosis of influenza from Nasopharyngeal swab specimens and should not be used as a sole basis for treatment. Nasal washings and aspirates are unacceptable for Xpert Xpress SARS-CoV-2/FLU/RSV testing.  Fact Sheet for Patients: EntrepreneurPulse.com.au  Fact Sheet for Healthcare Providers: IncredibleEmployment.be  This test is not yet approved or cleared by the [place] FDA and has been authorized for detection and/or diagnosis of SARS-CoV-2 by FDA under an Emergency Use Authorization (EUA). This EUA will remain in effect (meaning this test can be used) for the duration of the COVID-19 declaration under Section 564(b)(1) of the Act, 21 U.S.C. section  360bbb-3(b)(1), unless the authorization is terminated or revoked.     Resp Syncytial Virus by PCR NEGATIVE NEGATIVE Final    Comment: (NOTE) Fact Sheet for Patients: EntrepreneurPulse.com.au  Fact Sheet for Healthcare Providers: IncredibleEmployment.be  This test is not yet approved or cleared by the [place] FDA and has been authorized for detection and/or diagnosis of SARS-CoV-2 by FDA under an Emergency Use Authorization (EUA). This EUA will remain in effect (meaning this test can be used) for the duration of the COVID-19 declaration under Section 564(b)(1) of the Act, 21 U.S.C. section 360bbb-3(b)(1), unless the authorization is terminated or revoked.  Performed at [facility], [place] [address] [place], [place] [address]   Blood Culture (routine x 2)     Status: None   Collection Time: 10/29/22 12:01 PM   Specimen: BLOOD RIGHT HAND  Result Value Ref Range Status   Specimen Description   Final    BLOOD RIGHT HAND Performed at [facility], [place] [address] [place], [place] [address]    Special Requests   Final    BOTTLES DRAWN AEROBIC AND ANAEROBIC Blood Culture results may not be optimal due to an inadequate volume of blood received in culture bottles Performed at [place] [address], [place], [place] [address]    Culture   Final    NO GROWTH 5 DAYS Performed at [facility], [place] [address] [place], [place] [address]    Report Status 11/03/2022 FINAL  Final  Blood Culture (routine x 2)     Status: None   Collection Time: 10/29/22 12:13 PM   Specimen: BLOOD  Result Value Ref Range Status   Specimen Description   Final    BLOOD RIGHT ANTECUBITAL Performed at [place] [address], [place], [place] [address]    Special Requests   Final    BOTTLES DRAWN AEROBIC AND ANAEROBIC Blood Culture adequate volume Performed at [place] [address], [place], [place] [address]    Culture   Final    NO  GROWTH 5 DAYS Performed at [facility], [place] [address], [place], [place] [address]    Report Status 11/03/2022 FINAL  Final  MRSA Next Gen by PCR, Nasal     Status: None   Collection Time: 10/29/22  9:43 PM   Specimen: Nasal Mucosa; Nasal Swab  Result Value Ref Range Status   MRSA by PCR Next Gen NOT DETECTED NOT DETECTED Final    Comment: (NOTE) The GeneXpert MRSA Assay (FDA approved for NASAL specimens only), is one component of a comprehensive MRSA colonization surveillance program. It is not intended to diagnose MRSA infection nor to guide or monitor treatment for MRSA infections. Test performance is not FDA approved in patients less than 69 years old. Performed at [facility], [place] [address] [place], [place] [address]   Expectorated Sputum Assessment w Gram Stain, Rflx to Resp Cult     Status: None   Collection Time: 11/01/22 11:56 AM   Specimen: Expectorated Sputum  Result Value Ref Range Status   Specimen Description EXPECTORATED SPUTUM  Final   Special Requests Normal  Final   Sputum evaluation   Final    THIS SPECIMEN IS ACCEPTABLE FOR SPUTUM CULTURE Performed at [facility], [place] [address] [place], [place] [address]    Report Status 11/01/2022 FINAL  Final  Culture, Respiratory w Gram Stain     Status: None   Collection Time: 11/01/22 11:56 AM  Result Value Ref Range Status   Specimen Description   Final    EXPECTORATED SPUTUM Performed at [facility], [place] [address] [place], [place] [address]    Special Requests   Final    Normal Reflexed from [id number] Performed at [facility], [place] [address] [place], [place] [address]    Gram Stain NO WBC SEEN RARE GRAM POSITIVE COCCI   Final   Culture   Final    FEW Normal respiratory flora-no Staph aureus or Pseudomonas seen Performed at [place], [place] [address] [place],  [address]    Report Status 11/03/2022 FINAL  Final     Labs: BNP (last 3 results) Recent Labs    06/01/22 1313  BNP 123456*   Basic Metabolic Panel: Recent Labs  Lab 11/01/22 0330 11/02/22 0317 11/04/22 0359 11/05/22 0457 11/07/22 0339  NA 139 137 135  --  134*  K 4.3 4.8 4.3  --  3.6  CL 106 104 97*  --  95*  CO2 '25 24 29  '$ --  29  GLUCOSE 126* 106* 96  --  99  BUN 40* 38* 23  --  20  CREATININE 1.07 1.03 0.94 0.93 0.79  CALCIUM 7.8* 8.0* 7.7*  --  8.0*  MG 2.5*  --   --   --   --    Liver Function Tests: No results for input(s): "AST", "ALT", "ALKPHOS", "BILITOT", "PROT", "ALBUMIN" in the last 168 hours. No results for input(s): "LIPASE", "AMYLASE" in the last 168 hours. No results for input(s): "AMMONIA" in the last 168 hours. CBC: Recent Labs  Lab 10/31/22 1248 11/02/22 0317  WBC 23.4* 9.9  HGB 15.5 14.6  HCT 47.9 45.7  MCV 102.4* 100.4*  PLT 214 277   Cardiac Enzymes: No results for input(s): "CKTOTAL", "CKMB", "CKMBINDEX", "TROPONINI" in the last 168 hours. BNP: Invalid input(s): "POCBNP" CBG: Recent Labs  Lab 11/06/22 0740 11/06/22 1137 11/06/22 1604 11/06/22 2016 11/07/22 0743  GLUCAP 83 109* 128* 130* 83  D-Dimer No results for input(s): "DDIMER" in the last 72 hours. Hgb A1c No results for input(s): "HGBA1C" in the last 72 hours. Lipid Profile No results for input(s): "CHOL", "HDL", "LDLCALC", "TRIG", "CHOLHDL", "LDLDIRECT" in the last 72 hours. Thyroid function studies No results for input(s): "TSH", "T4TOTAL", "T3FREE", "THYROIDAB" in the last 72 hours.  Invalid input(s): "FREET3" Anemia work up No results for input(s): "VITAMINB12", "FOLATE", "FERRITIN", "TIBC", "IRON", "RETICCTPCT" in the last 72 hours. Urinalysis    Component Value Date/Time   COLORURINE AMBER (A) 10/29/2022 1148   APPEARANCEUR CLOUDY (A) 10/29/2022 1148   LABSPEC 1.020 10/29/2022 1148   PHURINE 5.0 10/29/2022 1148   GLUCOSEU  NEGATIVE 10/29/2022 1148   HGBUR SMALL (A) 10/29/2022 1148   BILIRUBINUR NEGATIVE 10/29/2022 1148   KETONESUR 5 (A) 10/29/2022 1148   PROTEINUR 100 (A) 10/29/2022 1148   NITRITE NEGATIVE 10/29/2022 1148   LEUKOCYTESUR NEGATIVE 10/29/2022 1148   Sepsis Labs Recent Labs  Lab 10/31/22 1248 11/02/22 0317  WBC 23.4* 9.9   Microbiology Recent Results (from the past 240 hour(s))  Resp panel by RT-PCR (RSV, Flu A&B, Covid) Anterior Nasal Swab     Status: None   Collection Time: 10/29/22 11:08 AM   Specimen: Anterior Nasal Swab  Result Value Ref Range Status   SARS Coronavirus 2 by RT PCR NEGATIVE NEGATIVE Final    Comment: (NOTE) SARS-CoV-2 target nucleic acids are NOT DETECTED.  The SARS-CoV-2 RNA is generally detectable in upper respiratory specimens during the acute phase of infection. The lowest concentration of SARS-CoV-2 viral copies this assay can detect is 138 copies/mL. A negative result does not preclude SARS-Cov-2 infection and should not be used as the sole basis for treatment or other patient management decisions. A negative result may occur with  improper specimen collection/handling, submission of specimen other than nasopharyngeal swab, presence of viral mutation(s) within the areas targeted by this assay, and inadequate number of viral copies(<138 copies/mL). A negative result must be combined with clinical observations, patient history, and epidemiological information. The expected result is Negative.  Fact Sheet for Patients:  EntrepreneurPulse.com.au  Fact Sheet for Healthcare Providers:  IncredibleEmployment.be  This test is no t yet approved or cleared by the [place] FDA and  has been authorized for detection and/or diagnosis of SARS-CoV-2 by FDA under an Emergency Use Authorization (EUA). This EUA will remain  in effect (meaning this test can be used) for the duration of the COVID-19 declaration under Section  564(b)(1) of the Act, 21 U.S.C.section 360bbb-3(b)(1), unless the authorization is terminated  or revoked sooner.       Influenza A by PCR NEGATIVE NEGATIVE Final   Influenza B by PCR NEGATIVE NEGATIVE Final    Comment: (NOTE) The Xpert Xpress SARS-CoV-2/FLU/RSV plus assay is intended as an aid in the diagnosis of influenza from Nasopharyngeal swab specimens and should not be used as a sole basis for treatment. Nasal washings and aspirates are unacceptable for Xpert Xpress SARS-CoV-2/FLU/RSV testing.  Fact Sheet for Patients: EntrepreneurPulse.com.au  Fact Sheet for Healthcare Providers: IncredibleEmployment.be  This test is not yet approved or cleared by the [place] FDA and has been authorized for detection and/or diagnosis of SARS-CoV-2 by FDA under an Emergency Use Authorization (EUA). This EUA will remain in effect (meaning this test can be used) for the duration of the COVID-19 declaration under Section 564(b)(1) of the Act, 21 U.S.C. section 360bbb-3(b)(1), unless the authorization is terminated or revoked.     Resp Syncytial Virus by PCR NEGATIVE  NEGATIVE Final    Comment: (NOTE) Fact Sheet for Patients: EntrepreneurPulse.com.au  Fact Sheet for Healthcare Providers: IncredibleEmployment.be  This test is not yet approved or cleared by the [place] FDA and has been authorized for detection and/or diagnosis of SARS-CoV-2 by FDA under an Emergency Use Authorization (EUA). This EUA will remain in effect (meaning this test can be used) for the duration of the COVID-19 declaration under Section 564(b)(1) of the Act, 21 U.S.C. section 360bbb-3(b)(1), unless the authorization is terminated or revoked.  Performed at [facility], [place] [address] [place], [place] [address]   Blood Culture (routine x 2)     Status: None   Collection Time: 10/29/22 12:01 PM   Specimen:  BLOOD RIGHT HAND  Result Value Ref Range Status   Specimen Description   Final    BLOOD RIGHT HAND Performed at [facility], [place] [address] [place], [place] [address]    Special Requests   Final    BOTTLES DRAWN AEROBIC AND ANAEROBIC Blood Culture results may not be optimal due to an inadequate volume of blood received in culture bottles Performed at [place] [address] [place], [place] [address]    Culture   Final    NO GROWTH 5 DAYS Performed at [facility], [place] [address] [place], [place] [address]    Report Status 11/03/2022 FINAL  Final  Blood Culture (routine x 2)     Status: None   Collection Time: 10/29/22 12:13 PM   Specimen: BLOOD  Result Value Ref Range Status   Specimen Description   Final    BLOOD RIGHT ANTECUBITAL Performed at [place] [address] [place], [place] [address]    Special Requests   Final    BOTTLES DRAWN AEROBIC AND ANAEROBIC Blood Culture adequate volume Performed at [place] [address], [place], [place] [address]    Culture   Final    NO GROWTH 5 DAYS Performed at [facility], [place] [address] [place], [place] [address]    Report Status 11/03/2022 FINAL  Final  MRSA Next Gen by PCR, Nasal     Status: None   Collection Time: 10/29/22  9:43 PM   Specimen: Nasal Mucosa; Nasal Swab  Result Value Ref Range Status   MRSA by PCR Next Gen NOT DETECTED NOT DETECTED Final    Comment: (NOTE) The GeneXpert MRSA Assay (FDA approved for NASAL specimens only), is one component of a comprehensive MRSA colonization surveillance program. It is not intended to diagnose MRSA infection nor to guide or monitor treatment for MRSA infections. Test performance is not FDA approved in patients less than 14 years old. Performed at [facility], [place] [address], [place], [place] [address]   Expectorated Sputum Assessment w Gram Stain, Rflx to Resp  Cult     Status: None   Collection Time: 11/01/22 11:56 AM   Specimen: Expectorated Sputum  Result Value Ref Range Status   Specimen Description EXPECTORATED SPUTUM  Final   Special Requests Normal  Final   Sputum evaluation   Final    THIS SPECIMEN IS ACCEPTABLE FOR SPUTUM CULTURE Performed at [facility], [place] [address], [place], [place] [address]    Report Status 11/01/2022 FINAL  Final  Culture, Respiratory w Gram Stain     Status: None   Collection Time: 11/01/22 11:56 AM  Result Value Ref Range Status   Specimen  Description   Final    EXPECTORATED SPUTUM Performed at [place] [address], [place], [place] [address]    Special Requests   Final    Normal Reflexed from [id number] Performed at [facility], [place] [address] [place], [place] [address]    Gram Stain NO WBC SEEN RARE GRAM POSITIVE COCCI   Final   Culture   Final    FEW Normal respiratory flora-no Staph aureus or Pseudomonas seen Performed at [facility], [place] [address] [place], [place] [address]    Report Status 11/03/2022 FINAL  Final     Patient was seen and examined on the day of discharge and was found to be in stable condition. Time coordinating discharge: 25 minutes including assessment and coordination of care, as well as examination of the patient.   SIGNED:  Dessa Phi, DO [facility] 11/07/2022, 11:06 AM

## 2022-11-07 NOTE — TOC Transition Note (Signed)
Transition of Care [facility]) - CM/SW Discharge Note   Patient Details  Name: Jonathan [name] MRN: [id number] Date of Birth: 03-11-1951  Transition of Care [facility]) CM/SW Contact:  Illene Regulus, LCSW Phone Number: 11/07/2022, 11:48 AM   Clinical Narrative:    CSW spoke with pt and verify that he received his O2. No additional TOC needs TOC sign off.    Final next level of care: Home/Self Care Barriers to Discharge: No Barriers Identified   Patient Goals and CMS Choice CMS Medicare.gov Compare Post Acute Care list provided to:: Patient Choice offered to / list presented to : Patient  Discharge Placement                         Discharge Plan and Services Additional resources added to the After Visit Summary for   In-house Referral: NA Discharge Planning Services: CM Consult Post Acute Care Choice: Home Health          DME Arranged: Oxygen DME Agency: [employer]       HH Arranged: PT, OT   Date HH Agency Contacted: 11/02/22 Time [place]: [id number] Representative spoke with at [place]: kelly  Social Determinants of Health ([place]) Interventions SDOH Screenings   Food Insecurity: No Food Insecurity (10/29/2022)  Housing: Low Risk  (10/29/2022)  Transportation Needs: No Transportation Needs (10/29/2022)  Utilities: Not At Risk (10/29/2022)  Tobacco Use: Medium Risk (10/29/2022)     Readmission Risk Interventions    11/01/2022    9:52 AM 10/25/2020   10:55 AM  Readmission Risk Prevention Plan  Transportation Screening Complete Complete  PCP or Specialist Appt within 5-7 Days  Not Complete  Not Complete comments  pt going to [place] rehab  PCP or Specialist Appt within 3-5 Days Complete   Home Care Screening  Complete  Medication Review (RN CM)  Complete  HRI or Home Care Consult Complete   Social Work Consult for [place] Planning/Counseling Complete   Palliative Care Screening Not Applicable   Medication Review Press photographer)  Complete

## 2022-11-07 NOTE — Progress Notes (Signed)
Patient received discharge orders to go home. RN notified patient's spouse that patient had been given discharge orders. O2 was delivered to patient's room and O2 was also set up at patient's home. RN waited for patient's spouse to get there prior to going over discharge instructions/paperwork with the patient. RN went over discharge paperwork/instructions. Patient and patient's spouse did not have any concerns/questions. Patient was stable upon discharge and was taken down to be picked up by spouse to go home. Patient's belongings were returned and patient left with belongings.

## 2022-11-07 NOTE — Plan of Care (Signed)

## 2022-11-24 ENCOUNTER — Other Ambulatory Visit: Payer: Self-pay

## 2022-11-24 ENCOUNTER — Telehealth: Payer: Self-pay | Admitting: Cardiovascular Disease

## 2022-11-24 DIAGNOSIS — E785 Hyperlipidemia, unspecified: Secondary | ICD-10-CM

## 2022-11-24 DIAGNOSIS — Z79899 Other long term (current) drug therapy: Secondary | ICD-10-CM

## 2022-11-24 DIAGNOSIS — I2510 Atherosclerotic heart disease of native coronary artery without angina pectoris: Secondary | ICD-10-CM

## 2022-11-24 DIAGNOSIS — Z789 Other specified health status: Secondary | ICD-10-CM

## 2022-11-24 NOTE — Telephone Encounter (Signed)
Pt would like to have his labs drawn for Dr. Claiborne Billings at his PCP. He would like for some physical lab orders to be mailed to him.  He wanted to make an appt after but I put him on the waitlist because Dr. Claiborne Billings is booked and he didn't want to see APP.

## 2022-11-24 NOTE — Telephone Encounter (Signed)
Called pt to let him know I will mail out the orders for his labs. He thanked me for calling him. Orders placed in outgoing mail.

## 2022-12-12 ENCOUNTER — Ambulatory Visit: Payer: Medicare Other | Admitting: Pulmonary Disease

## 2022-12-12 ENCOUNTER — Encounter: Payer: Self-pay | Admitting: Pulmonary Disease

## 2022-12-12 VITALS — BP 118/72 | HR 69 | Ht 67.0 in | Wt 209.0 lb

## 2022-12-12 DIAGNOSIS — J9611 Chronic respiratory failure with hypoxia: Secondary | ICD-10-CM | POA: Diagnosis not present

## 2022-12-12 DIAGNOSIS — F17210 Nicotine dependence, cigarettes, uncomplicated: Secondary | ICD-10-CM | POA: Diagnosis not present

## 2022-12-12 DIAGNOSIS — J449 Chronic obstructive pulmonary disease, unspecified: Secondary | ICD-10-CM | POA: Diagnosis not present

## 2022-12-12 MED ORDER — IPRATROPIUM-ALBUTEROL 0.5-2.5 (3) MG/3ML IN SOLN: 3.0000 mL | Freq: Four times a day (QID) | RESPIRATORY_TRACT | 1 refills | Status: DC | PRN

## 2022-12-12 MED ORDER — TRELEGY ELLIPTA 100-62.5-25 MCG/ACT IN AEPB: 1.0000 | INHALATION_SPRAY | Freq: Every day | RESPIRATORY_TRACT | 0 refills | Status: DC

## 2022-12-12 NOTE — Progress Notes (Signed)
Synopsis: Referred in March 2024 for COPD by Leonard Downing, MD  Subjective:   PATIENT ID: Jonathan [name] GENDER: male DOB: [birth year], MRN: [id number]  HPI  Chief Complaint  Patient presents with   Consult    Referred for history of COPD and PNA. States he had PNA back in February 2024. Uses 4L of O2, was discharged on O2.    Jonathan [name] is a 72 year old male, daily smoker with CAD s/p CABG, chronic respiratory failure and COPD who is referred to pulmonary clinic for evaluation of COPD.  He was admitted 2/17 to 2/26 for sepsis due to pneumonia. Records reviewed. He was discharged with oxygen, he is using 4L day/night. He feels he is sleeping better with the oxygen. He is using symbicort 160-4.60mcg 2 puffs mainly in the morning. He is using the albuterol 1-2 times per day.   He was previously followed by Dr. Melvyn Novas, last seen in 2019.   Spirometry 2022 shows severe obstructive defect. PFTs from 2019 showed mild diffusion defect.   He is smoking half a pack per day. He has smoked for 40+ years, 1 ppd. Since he had his heart surgery, he has been smoking half pack per day. 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 Father had heart disease and stroke.   Past Medical History:  Diagnosis Date   Arthritis    hands    Chronic bronchitis    Chronic sinusitis of both maxillary sinuses 04/16/2018   Sinus CT 04/25/2018 Mild mucoperiosteal thickening involving the frontoethmoidal sinuses. Otherwise largely clear sinuses with no evidence for acute sinusitis.    COPD GOLD II   active smoker 04/16/2018   Spirometry 04/16/2018  FEV1 1.43 (47%)  Ratio 62  s prior rx  - 04/16/2018    try symbicort 160 2bid - PFT's  06/08/2018  FEV1 2.03 (71 % ) ratio 61  p 5 % improvement from saba p nothing prior to study with DLCO  63 % corrects to 72  % for alv volume    -  06/08/2018  After extensive coaching inhaler  device,  effectiveness =    75%  (short ti/ variable flow) > continue symbicort 160 2bid    Coronary artery disease    Coronary artery disease involving native coronary artery of native heart with unstable angina pectoris    DOE (dyspnea on exertion) 04/16/2018   Essential hypertension, benign 09/23/2012   Changed acei to arb 04/16/2018 due to pseudowheeze > improved 06/08/2018    Hernia, inguinal 09/23/2012   Hyperlipidemia    has not tolerated statins,  would not pursue PSK9 inhibitor as advised by Dr Claiborne Billings   Hypertension    Myocardial infarction    NSTEMI (non-ST elevated myocardial infarction) 09/23/2012   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   Tobacco abuse      Family History  Problem Relation Age of Onset   CAD Father        CABG with subsequent stroke     Social History   Socioeconomic History   Marital status: Married    Spouse name: Not on file   Number of children: Not on file   Years of education: Not on file   Highest education level: Not on file  Occupational History   Not on file  Tobacco Use   Smoking status: Former    Packs/day: 1.00    Years: 53.00  Additional pack years: 0.00    Total pack years: 53.00    Types: Cigarettes    Quit date: 10/14/2020    Years since quitting: 2.1   Smokeless tobacco: Never  Vaping Use   Vaping Use: Never used  Substance and Sexual Activity   Alcohol use: Yes    Alcohol/week: 5.0 standard drinks of alcohol    Types: 5 Standard drinks or equivalent per week    Comment: consumes 1 pint per day    Drug use: No   Sexual activity: Yes  Other Topics Concern   Not on file  Social History Narrative   Retired   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   Social Determinants of Radio broadcast assistant Strain: Not on file  Food Insecurity: No Food Insecurity (10/29/2022)   Hunger Vital Sign    Worried About Running Out of Food in the Last Year: Never true    Ran Out of Food in the Last Year: Never true   Transportation Needs: No Transportation Needs (10/29/2022)   PRAPARE - Hydrologist (Medical): No    Lack of Transportation (Non-Medical): No  Physical Activity: Not on file  Stress: Not on file  Social Connections: Not on file  Intimate Partner Violence: Not At Risk (10/29/2022)   Humiliation, Afraid, Rape, and Kick questionnaire    Fear of Current or Ex-Partner: No    Emotionally Abused: No    Physically Abused: No    Sexually Abused: No     Allergies  Allergen Reactions   Niaspan [Niacin Er] Itching and Other (See Comments)    insomnia   Statins     LFT elevavation, CK elevevation     Outpatient Medications Prior to Visit  Medication Sig Dispense Refill   albuterol (VENTOLIN HFA) 108 (90 Base) MCG/ACT inhaler Inhale 1-2 puffs into the lungs every 6 (six) hours as needed for wheezing or shortness of breath. 8 g 2   aspirin 81 MG chewable tablet Chew 1 tablet (81 mg total) by mouth daily.     clopidogrel (PLAVIX) 75 MG tablet TAKE 1 TABLET BY MOUTH  DAILY (Patient taking differently: Take 75 mg by mouth daily.) 100 tablet 2   furosemide (LASIX) 40 MG tablet Take 1 tablet (40 mg total) by mouth daily. 30 tablet 1   hydrALAZINE (APRESOLINE) 25 MG tablet Take 1 tablet (25 mg total) by mouth every 8 (eight) hours. 90 tablet 1   metoprolol succinate (TOPROL-XL) 25 MG 24 hr tablet Take 1 tablet (25 mg total) by mouth daily. 30 tablet 1   potassium chloride (KLOR-CON 10) 10 MEQ tablet Take 2 tablets (20 mEq total) by mouth daily. 60 tablet 1   REPATHA SURECLICK [id number] MG/ML SOAJ INJECT 140 MG INTO THE SKIN EVERY 14 DAYS 2 mL 11   sodium chloride (OCEAN) 0.65 % SOLN nasal spray Place 1 spray into both nostrils 2 (two) times daily at 8 am and 10 pm.  0   SYMBICORT 160-4.5 MCG/ACT inhaler INHALE 2 PUFFS INTO THE LUNGS TWICE DAILY 10.2 g 11   guaiFENesin 200 MG tablet Take 1 tablet (200 mg total) by mouth every 6 (six) hours as needed for cough or to loosen phlegm.  (Patient not taking: Reported on 09/23/2021) 60 suppository 0   nitroGLYCERIN (NITROSTAT) 0.4 MG SL tablet PLACE 1 TABLET UNDER THE TONGUE EVERY 5 MINUTES AS NEEDED FOR CHEST PAIN UP TO 3 DOSES, IF  SYMPTOMS PERSIST CALL 911 (Patient not taking: Reported on 10/29/2022) 25 tablet 6   predniSONE (DELTASONE) 10 MG tablet Take 4 tabs for 3 days, then 3 tabs for 3 days, then 2 tabs for 3 days, then 1 tab for 3 days, then 1/2 tab for 4 days. 32 tablet 0   No facility-administered medications prior to visit.    Review of Systems  Constitutional:  Negative for chills, fever, malaise/fatigue and weight loss.  HENT:  Negative for congestion, sinus pain and sore throat.   Eyes: Negative.   Respiratory:  Positive for cough, sputum production, shortness of breath and wheezing. Negative for hemoptysis.   Cardiovascular:  Negative for chest pain, palpitations, orthopnea, claudication and leg swelling.  Gastrointestinal:  Negative for abdominal pain, heartburn, nausea and vomiting.  Genitourinary: Negative.   Musculoskeletal:  Negative for joint pain and myalgias.  Skin:  Negative for rash.  Neurological:  Negative for weakness.  Endo/Heme/Allergies: Negative.   Psychiatric/Behavioral: Negative.     Objective:   Vitals:   12/12/22 1044  BP: 118/72  Pulse: 69  SpO2: 99%  Weight: 209 lb (94.8 kg)  Height: 5\' 7"  (1.702 m)     Physical Exam Constitutional:      General: He is not in acute distress. HENT:     Head: Normocephalic and atraumatic.  Eyes:     Extraocular Movements: Extraocular movements intact.     Conjunctiva/sclera: Conjunctivae normal.     Pupils: Pupils are equal, round, and reactive to light.  Cardiovascular:     Rate and Rhythm: Normal rate and regular rhythm.     Pulses: Normal pulses.     Heart sounds: Normal heart sounds. No murmur heard. Pulmonary:     Breath sounds: Wheezing (mild scattered, upper lobes) present.  Abdominal:     General: Bowel sounds are normal.      Palpations: Abdomen is soft.  Musculoskeletal:     Right lower leg: No edema.     Left lower leg: No edema.  Lymphadenopathy:     Cervical: No cervical adenopathy.  Skin:    General: Skin is warm and dry.  Neurological:     General: No focal deficit present.     Mental Status: He is alert.  Psychiatric:        Mood and Affect: Mood normal.        Behavior: Behavior normal.        Thought Content: Thought content normal.        Judgment: Judgment normal.    CBC    Component Value Date/Time   WBC 9.9 11/02/2022 0317   RBC 4.55 11/02/2022 0317   HGB 14.6 11/02/2022 0317   HGB 12.0 (L) 11/19/2020 1028   HCT 45.7 11/02/2022 0317   HCT 36.9 (L) 11/19/2020 1028   PLT 277 11/02/2022 0317   PLT 316 11/19/2020 1028   MCV 100.4 (H) 11/02/2022 0317   MCV 94 11/19/2020 1028   MCH 32.1 11/02/2022 0317   MCHC 31.9 11/02/2022 0317   RDW 14.6 11/02/2022 0317   RDW 11.7 11/19/2020 1028   LYMPHSABS 0.4 (L) 10/29/2022 1213   LYMPHSABS 1.7 11/19/2020 1028   MONOABS 0.8 10/29/2022 1213   EOSABS 0.0 10/29/2022 1213   EOSABS 1.0 (H) 11/19/2020 1028   BASOSABS 0.2 (H) 10/29/2022 1213   BASOSABS 0.1 11/19/2020 1028      Latest Ref Rng & Units 11/07/2022    3:39 AM 11/05/2022    4:57 AM 11/04/2022  3:59 AM  BMP  Glucose 70 - 99 mg/dL 99   96   BUN 8 - 23 mg/dL 20   23   Creatinine 0.61 - 1.24 mg/dL 0.79  0.93  0.94   Sodium 135 - 145 mmol/L 134   135   Potassium 3.5 - 5.1 mmol/L 3.6   4.3   Chloride 98 - 111 mmol/L 95   97   CO2 22 - 32 mmol/L 29   29   Calcium 8.9 - 10.3 mg/dL 8.0   7.7    Chest imaging: CT Chest 2022 1. Atherosclerosis of thoracic aorta is noted without aneurysm formation. 2. Mild coronary artery calcifications are noted suggesting coronary artery disease. 3. Minimal subsegmental atelectasis is noted in the right lower lobe. 4. Multiple bilateral pulmonary nodules are noted, the largest measuring 5 mm in the left upper lobe. No follow-up needed if patient is  low-risk (and has no known or suspected primary neoplasm). Non-contrast chest CT can be considered in 12 months if patient is high-risk. This recommendation follows the consensus statement: Guidelines for Management of Incidental Pulmonary Nodules Detected on CT Images: From the Fleischner Society 2017; Radiology 2017; 284:228-243.  PFT:    Latest Ref Rng & Units 10/14/2020   12:32 PM 06/08/2018   12:33 PM  PFT Results  FVC-Pre L 2.42  2.97   FVC-Predicted Pre % 59  76   FVC-Post L  3.31   FVC-Predicted Post %  85   Pre FEV1/FVC % % 52  65   Post FEV1/FCV % %  61   FEV1-Pre L 1.25  1.92   FEV1-Predicted Pre % 42  66   FEV1-Post L  2.03   DLCO uncorrected ml/min/mmHg  17.59   DLCO UNC% %  63   DLVA Predicted %  72   TLC L  6.75   TLC % Predicted %  106   RV % Predicted %  161     Labs:  Path:  Echo:  Heart Catheterization:  Assessment & Plan:   Chronic obstructive pulmonary disease, unspecified COPD type - Plan: Ambulatory Referral for DME  Chronic hypoxemic respiratory failure  Cigarette smoker - Plan: CANCELED: Ambulatory Referral for Lung Cancer Scre  Discussion: Jonathan [name] is a 72 year old male, daily smoker with CAD s/p CABG, chronic respiratory failure and COPD who is referred to pulmonary clinic for evaluation of COPD.  He is to try trelegy ellipta 100, 1 puff daily in place of symbicort and let us know if he has improvement in his symptoms. We will order him a nebulizer machine and supplies.   We will have his oxygen company evaluate him for a portable oxygen concentrator.   He has not desire to quit smoking. He does not wish to be referred for lung cancer screening.   Follow up in 6 months.   Freda Jackson, MD [facility] Office: [phone]    Current Outpatient Medications:    albuterol (VENTOLIN HFA) 108 (90 Base) MCG/ACT inhaler, Inhale 1-2 puffs into the lungs every 6 (six) hours as needed for wheezing or shortness  of breath., Disp: 8 g, Rfl: 2   aspirin 81 MG chewable tablet, Chew 1 tablet (81 mg total) by mouth daily., Disp: , Rfl:    clopidogrel (PLAVIX) 75 MG tablet, TAKE 1 TABLET BY MOUTH  DAILY (Patient taking differently: Take 75 mg by mouth daily.), Disp: 100 tablet, Rfl: 2   Fluticasone-Umeclidin-Vilant (TRELEGY ELLIPTA) 100-62.5-25 MCG/ACT AEPB, Inhale  1 puff into the lungs daily., Disp: 1 each, Rfl: 0   furosemide (LASIX) 40 MG tablet, Take 1 tablet (40 mg total) by mouth daily., Disp: 30 tablet, Rfl: 1   hydrALAZINE (APRESOLINE) 25 MG tablet, Take 1 tablet (25 mg total) by mouth every 8 (eight) hours., Disp: 90 tablet, Rfl: 1   ipratropium-albuterol (DUONEB) 0.5-2.5 (3) MG/3ML SOLN, Take 3 mLs by nebulization every 6 (six) hours as needed., Disp: 360 mL, Rfl: 1   metoprolol succinate (TOPROL-XL) 25 MG 24 hr tablet, Take 1 tablet (25 mg total) by mouth daily., Disp: 30 tablet, Rfl: 1   potassium chloride (KLOR-CON 10) 10 MEQ tablet, Take 2 tablets (20 mEq total) by mouth daily., Disp: 60 tablet, Rfl: 1   REPATHA SURECLICK [id number] MG/ML SOAJ, INJECT 140 MG INTO THE SKIN EVERY 14 DAYS, Disp: 2 mL, Rfl: 11   sodium chloride (OCEAN) 0.65 % SOLN nasal spray, Place 1 spray into both nostrils 2 (two) times daily at 8 am and 10 pm., Disp: , Rfl: 0   SYMBICORT 160-4.5 MCG/ACT inhaler, INHALE 2 PUFFS INTO THE LUNGS TWICE DAILY, Disp: 10.2 g, Rfl: 11

## 2022-12-12 NOTE — Patient Instructions (Addendum)
Try trelegy ellitpa 100,  1 puff daily for 2 weeks.  - rinse mouth out after each use  Stop symbicort inhaler while using trelegy  Use albuterol inhaler as needed  We will order you a nebulizer machine and supplies along with duoneb nebulizer solution to use every 6 hours as needed.   We will have your oxygen equipment company [facility]) check you for a portable oxygen concentrator.   Follow up in 6 months

## 2022-12-13 ENCOUNTER — Ambulatory Visit: Payer: Medicare Other | Admitting: Podiatry

## 2022-12-13 ENCOUNTER — Encounter: Payer: Self-pay | Admitting: Podiatry

## 2022-12-13 DIAGNOSIS — Z7901 Long term (current) use of anticoagulants: Secondary | ICD-10-CM

## 2022-12-13 DIAGNOSIS — Q828 Other specified congenital malformations of skin: Secondary | ICD-10-CM

## 2022-12-13 DIAGNOSIS — M79675 Pain in left toe(s): Secondary | ICD-10-CM

## 2022-12-13 DIAGNOSIS — M79674 Pain in right toe(s): Secondary | ICD-10-CM

## 2022-12-13 DIAGNOSIS — B351 Tinea unguium: Secondary | ICD-10-CM | POA: Diagnosis not present

## 2022-12-13 NOTE — Progress Notes (Signed)
  Subjective:  Patient ID: EDGEL VI, male    DOB: Dec 17, 1950,   MRN: [id number]  Chief Complaint  Patient presents with   Nail Problem    RFC    72 y.o. male presents for concern of thickened elongated and painful nails that are difficult to trim. Requesting to have them trimmed today. He is on chronic anticoagulation.   PCP:  Leonard Downing, MD    . Denies any other pedal complaints. Denies n/v/f/c.   Past Medical History:  Diagnosis Date   Arthritis    hands    Chronic bronchitis    Chronic sinusitis of both maxillary sinuses 04/16/2018   Sinus CT 04/25/2018 Mild mucoperiosteal thickening involving the frontoethmoidal sinuses. Otherwise largely clear sinuses with no evidence for acute sinusitis.    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    DOE (dyspnea on exertion) 04/16/2018   Essential hypertension, benign 09/23/2012   Changed acei to arb 04/16/2018 due to pseudowheeze > improved 06/08/2018    Hernia, inguinal 09/23/2012   Hyperlipidemia    has not tolerated statins,  would not pursue PSK9 inhibitor as advised by Dr Claiborne Billings   Hypertension    Myocardial infarction    NSTEMI (non-ST elevated myocardial infarction) 09/23/2012   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 CFT <3 seconds. Absent hair growth on digits. Edema noted to bilateral lower extremities. Xerosis noted bilaterally.   Skin. No lacerations or abrasions bilateral feet. Nails 1-5 bilateral  are thickened discolored and elongated with subungual debris. Hyperkeratotic tissue noted to bilateral plantar fifth metatarsal heads.  Musculoskeletal: MMT 5/5 bilateral lower extremities in DF, PF, Inversion and Eversion. Deceased ROM in DF of ankle joint.  Neurological: Sensation intact to light touch. Protective sensation intact bilateral.    Assessment:   1. Pain due to onychomycosis of toenails of both feet   2. Porokeratosis   3. Chronic anticoagulation      Plan:  Patient was evaluated and treated and all questions answered. -Mechanically debrided all nails 1-5 bilateral using sterile nail nipper and filed with dremel without incident  -Hyperkeratotic tissue debrided with chisel without incident.  -Answered all patient questions -Patient to return  in 3 months for at risk foot care -Patient advised to call the office if any problems or questions arise in the meantime.   Lorenda Peck, DPM

## 2022-12-20 ENCOUNTER — Other Ambulatory Visit: Payer: Self-pay | Admitting: Cardiovascular Disease

## 2022-12-20 DIAGNOSIS — E7849 Other hyperlipidemia: Secondary | ICD-10-CM

## 2022-12-20 DIAGNOSIS — I214 Non-ST elevation (NSTEMI) myocardial infarction: Secondary | ICD-10-CM

## 2022-12-21 ENCOUNTER — Telehealth: Payer: Self-pay | Admitting: Pulmonary Disease

## 2022-12-21 LAB — LAB REPORT - SCANNED: EGFR: 79

## 2022-12-22 NOTE — Telephone Encounter (Signed)
Patient is returning phone call.  Patient phone number is [phone]. °

## 2022-12-22 NOTE — Telephone Encounter (Signed)
Lm for patient.  

## 2022-12-22 NOTE — Telephone Encounter (Signed)
ATC patient at [phone] x2. Line rang for >59min and then received recording that call could not be completed.

## 2022-12-22 NOTE — Telephone Encounter (Signed)
Lm x1 for patient.  

## 2022-12-23 NOTE — Telephone Encounter (Signed)
Spoke with patient. Advised neb solution was sent to pharmacy. Pt will give them a call and verfiy. NFN

## 2022-12-23 NOTE — Telephone Encounter (Signed)
Patient is returning phone call. Patient phone number is [phone]. 

## 2023-01-24 ENCOUNTER — Other Ambulatory Visit: Payer: Self-pay | Admitting: Primary Care

## 2023-03-02 ENCOUNTER — Telehealth: Payer: Self-pay | Admitting: Pulmonary Disease

## 2023-03-02 MED ORDER — TRELEGY ELLIPTA 100-62.5-25 MCG/ACT IN AEPB: 1.0000 | INHALATION_SPRAY | Freq: Every day | RESPIRATORY_TRACT | 5 refills | Status: DC

## 2023-03-02 NOTE — Telephone Encounter (Signed)
ATC X1 LVM for patient to call the office back. Please advise Trelegy has been sent to pharmacy

## 2023-03-02 NOTE — Telephone Encounter (Signed)
Let pt. Know the med got sent to pharmacy

## 2023-03-02 NOTE — Telephone Encounter (Signed)
Pt. Calling was given samples last time in office and working well for him would like a script now for Fluticasone-Umeclidin-Vilant (TRELEGY ELLIPTA)  to go to his pharmacy

## 2023-03-10 ENCOUNTER — Other Ambulatory Visit: Payer: Self-pay | Admitting: Cardiovascular Disease

## 2023-03-30 ENCOUNTER — Other Ambulatory Visit: Payer: Self-pay | Admitting: Cardiovascular Disease

## 2023-04-10 ENCOUNTER — Other Ambulatory Visit: Payer: Self-pay | Admitting: Cardiovascular Disease

## 2023-04-10 DIAGNOSIS — E7849 Other hyperlipidemia: Secondary | ICD-10-CM

## 2023-04-10 DIAGNOSIS — I214 Non-ST elevation (NSTEMI) myocardial infarction: Secondary | ICD-10-CM

## 2023-04-19 ENCOUNTER — Other Ambulatory Visit: Payer: Self-pay | Admitting: Cardiovascular Disease

## 2023-04-29 ENCOUNTER — Emergency Department (HOSPITAL_COMMUNITY)
Admission: EM | Admit: 2023-04-29 | Discharge: 2023-04-29 | Disposition: A | Discharge status: Home / Self Care | Payer: Medicare Other | Source: Home / Self Care | Attending: Emergency Medicine | Admitting: Emergency Medicine

## 2023-04-29 ENCOUNTER — Encounter (HOSPITAL_COMMUNITY): Payer: Self-pay

## 2023-04-29 ENCOUNTER — Emergency Department (HOSPITAL_COMMUNITY): Payer: Medicare Other

## 2023-04-29 ENCOUNTER — Other Ambulatory Visit: Payer: Self-pay

## 2023-04-29 DIAGNOSIS — I10 Essential (primary) hypertension: Secondary | ICD-10-CM | POA: Insufficient documentation

## 2023-04-29 DIAGNOSIS — S82432A Displaced oblique fracture of shaft of left fibula, initial encounter for closed fracture: Secondary | ICD-10-CM | POA: Diagnosis not present

## 2023-04-29 DIAGNOSIS — F1010 Alcohol abuse, uncomplicated: Secondary | ICD-10-CM | POA: Diagnosis not present

## 2023-04-29 DIAGNOSIS — F17200 Nicotine dependence, unspecified, uncomplicated: Secondary | ICD-10-CM | POA: Insufficient documentation

## 2023-04-29 DIAGNOSIS — Z79899 Other long term (current) drug therapy: Secondary | ICD-10-CM | POA: Insufficient documentation

## 2023-04-29 DIAGNOSIS — Z7951 Long term (current) use of inhaled steroids: Secondary | ICD-10-CM | POA: Diagnosis not present

## 2023-04-29 DIAGNOSIS — X501XXA Overexertion from prolonged static or awkward postures, initial encounter: Secondary | ICD-10-CM | POA: Insufficient documentation

## 2023-04-29 DIAGNOSIS — Z7982 Long term (current) use of aspirin: Secondary | ICD-10-CM | POA: Diagnosis not present

## 2023-04-29 DIAGNOSIS — Z7902 Long term (current) use of antithrombotics/antiplatelets: Secondary | ICD-10-CM | POA: Insufficient documentation

## 2023-04-29 DIAGNOSIS — Y906 Blood alcohol level of 120-199 mg/100 ml: Secondary | ICD-10-CM | POA: Diagnosis not present

## 2023-04-29 DIAGNOSIS — J449 Chronic obstructive pulmonary disease, unspecified: Secondary | ICD-10-CM | POA: Diagnosis not present

## 2023-04-29 DIAGNOSIS — S83125A Posterior dislocation of proximal end of tibia, left knee, initial encounter: Secondary | ICD-10-CM | POA: Diagnosis not present

## 2023-04-29 DIAGNOSIS — S9305XA Dislocation of left ankle joint, initial encounter: Secondary | ICD-10-CM

## 2023-04-29 DIAGNOSIS — S99912A Unspecified injury of left ankle, initial encounter: Secondary | ICD-10-CM | POA: Diagnosis present

## 2023-04-29 LAB — CBC WITH DIFFERENTIAL/PLATELET
Abs Immature Granulocytes: 0.08 10*3/uL — ABNORMAL HIGH (ref 0.00–0.07)
Basophils Absolute: 0 10*3/uL (ref 0.0–0.1)
Basophils Relative: 0 %
Eosinophils Absolute: 0 10*3/uL (ref 0.0–0.5)
Eosinophils Relative: 0 %
HCT: 49.8 % (ref 39.0–52.0)
Hemoglobin: 16.6 g/dL (ref 13.0–17.0)
Immature Granulocytes: 0 %
Lymphocytes Relative: 12 %
Lymphs Abs: 2.1 10*3/uL (ref 0.7–4.0)
MCH: 33 pg (ref 26.0–34.0)
MCHC: 33.3 g/dL (ref 30.0–36.0)
MCV: 99 fL (ref 80.0–100.0)
Monocytes Absolute: 0.8 10*3/uL (ref 0.1–1.0)
Monocytes Relative: 4 %
Neutro Abs: 15 10*3/uL — ABNORMAL HIGH (ref 1.7–7.7)
Neutrophils Relative %: 84 %
Platelets: 212 10*3/uL (ref 150–400)
RBC: 5.03 MIL/uL (ref 4.22–5.81)
RDW: 14 % (ref 11.5–15.5)
WBC: 18 10*3/uL — ABNORMAL HIGH (ref 4.0–10.5)
nRBC: 0 % (ref 0.0–0.2)

## 2023-04-29 LAB — TYPE AND SCREEN
ABO/RH(D): B POS
Antibody Screen: NEGATIVE

## 2023-04-29 LAB — BASIC METABOLIC PANEL
Anion gap: 13 (ref 5–15)
BUN: 12 mg/dL (ref 8–23)
CO2: 24 mmol/L (ref 22–32)
Calcium: 8.5 mg/dL — ABNORMAL LOW (ref 8.9–10.3)
Chloride: 98 mmol/L (ref 98–111)
Creatinine, Ser: 1.05 mg/dL (ref 0.61–1.24)
GFR, Estimated: >60 mL/min (ref >60)
Glucose, Bld: 89 mg/dL (ref 70–99)
Potassium: 4.2 mmol/L (ref 3.5–5.1)
Sodium: 135 mmol/L (ref 135–145)

## 2023-04-29 LAB — ETHANOL: Alcohol, Ethyl (B): 151 mg/dL — ABNORMAL HIGH (ref <10)

## 2023-04-29 MED ORDER — ACETAMINOPHEN 500 MG PO TABS
1000.0000 mg | ORAL_TABLET | Freq: Once | ORAL | Status: AC
  Administered 2023-04-29: 1000 mg via ORAL
  Filled 2023-04-29: qty 2

## 2023-04-29 NOTE — ED Provider Notes (Signed)
[facility] AT [facility] Provider Note   CSN: [id number] Arrival date & time: 04/29/23  1722     History  Chief Complaint  Patient presents with   Ankle Injury    Jonathan [name] is a 72 y.o. male with past medical history significant for hypertension, NSTEMI, COPD, tobacco abuse, alcohol abuse, arthritis presents to the ED complaining of left ankle pain after a fall.  Patient states he twisted his ankle while walking.  EMS placed patient in a soft splint prior to ED arrival.  Patient admits to consuming beer today, but does not specify how much.  Denies loss of consciousness, head injury, or other pain.         Home Medications Prior to Admission medications   Medication Sig Start Date End Date Taking? Authorizing Provider  albuterol (VENTOLIN HFA) 108 (90 Base) MCG/ACT inhaler Inhale 1-2 puffs into the lungs every 6 (six) hours as needed for wheezing or shortness of breath. 11/07/22   Noralee Stain, DO  aspirin 81 MG chewable tablet Chew 1 tablet (81 mg total) by mouth daily. 11/04/20   Love, Evlyn Kanner, PA-C  clopidogrel (PLAVIX) 75 MG tablet TAKE 1 TABLET BY MOUTH DAILY 04/19/23   Lennette Bihari, MD  Evolocumab (REPATHA SURECLICK) 140 MG/ML SOAJ INJECT 140 MG INTO THE SKIN EVERY 14 DAYS 04/10/23   Lennette Bihari, MD  Fluticasone-Umeclidin-Vilant (TRELEGY ELLIPTA) 100-62.5-25 MCG/ACT AEPB Inhale 1 puff into the lungs daily. 03/02/23   Martina Sinner, MD  furosemide (LASIX) 40 MG tablet Take 1 tablet (40 mg total) by mouth daily. 11/07/22 01/06/23  Noralee Stain, DO  hydrALAZINE (APRESOLINE) 25 MG tablet Take 1 tablet (25 mg total) by mouth every 8 (eight) hours. 11/07/22   Noralee Stain, DO  ipratropium-albuterol (DUONEB) 0.5-2.5 (3) MG/3ML SOLN Take 3 mLs by nebulization every 6 (six) hours as needed. 12/12/22   Martina Sinner, MD  metoprolol succinate (TOPROL-XL) 25 MG 24 hr tablet Take 1 tablet (25 mg total) by mouth daily. 11/08/22   Noralee Stain, DO  potassium chloride (KLOR-CON 10) 10 MEQ tablet Take 2 tablets (20 mEq total) by mouth daily. 11/07/22   Noralee Stain, DO  sodium chloride (OCEAN) 0.65 % SOLN nasal spray Place 1 spray into both nostrils 2 (two) times daily at 8 am and 10 pm. 11/04/20   Love, Evlyn Kanner, PA-C  SYMBICORT 160-4.5 MCG/ACT inhaler INHALE 2 PUFFS INTO THE LUNGS TWICE DAILY 09/10/21   Glenford Bayley, NP      Allergies    Niaspan [niacin er] and Statins    Review of Systems   Review of Systems  Musculoskeletal:  Positive for arthralgias (L ankle), gait problem (unable to walk due to L ankle pain) and joint swelling (L ankle).  Neurological:  Negative for syncope and headaches.    Physical Exam Updated Vital Signs BP 120/64 (BP Location: Right Arm)   Pulse 74   Temp 98 F (36.7 C) (Oral)   Resp 20   Ht 5\' 7"  (1.702 m)   Wt 97.5 kg   SpO2 93%   BMI 33.67 kg/m  Physical Exam Vitals and nursing note reviewed.  Constitutional:      General: He is not in acute distress.    Appearance: Normal appearance. He is not ill-appearing or diaphoretic.  Cardiovascular:     Rate and Rhythm: Normal rate and regular rhythm.  Pulmonary:     Effort: Pulmonary effort is normal.  Musculoskeletal:  Left knee: Erythema present. No swelling or deformity. No tenderness.     Left ankle: Swelling and deformity present. Tenderness (generalized) present. Decreased range of motion. Normal pulse.     Left Achilles Tendon: No tenderness.     Comments: Abrasion to left knee with mild surrounding erythema.   Skin:    General: Skin is warm and dry.     Capillary Refill: Capillary refill takes less than 2 seconds.  Neurological:     Mental Status: He is alert. Mental status is at baseline.     GCS: GCS eye subscore is 4. GCS verbal subscore is 5. GCS motor subscore is 6.  Psychiatric:        Mood and Affect: Mood normal.        Speech: Speech is slurred.        Behavior: Behavior is agitated.     ED  Results / Procedures / Treatments   Labs (all labs ordered are listed, but only abnormal results are displayed) Labs Reviewed  ETHANOL - Abnormal; Notable for the following components:      Result Value   Alcohol, Ethyl (B) 151 (*)    All other components within normal limits  CBC WITH DIFFERENTIAL/PLATELET - Abnormal; Notable for the following components:   WBC 18.0 (*)    Neutro Abs 15.0 (*)    Abs Immature Granulocytes 0.08 (*)    All other components within normal limits  BASIC METABOLIC PANEL - Abnormal; Notable for the following components:   Calcium 8.5 (*)    All other components within normal limits  TYPE AND SCREEN    EKG None  Radiology DG Tibia/Fibula Left  Result Date: 04/29/2023 CLINICAL DATA:  Postreduction EXAM: LEFT TIBIA AND FIBULA - 2 VIEW COMPARISON:  Same day ankle radiographs FINDINGS: Improved alignment postreduction of the oblique fracture through the distal fibula as well as the dislocated talus. Soft tissue swelling about the ankle. IMPRESSION: Successful reduction of the oblique distal fibular fracture and talar dislocation. Electronically Signed   By: Minerva Fester M.D.   On: 04/29/2023 19:24   DG Ankle Complete Left  Result Date: 04/29/2023 CLINICAL DATA:  Pain EXAM: LEFT ANKLE COMPLETE - 3+ VIEW COMPARISON:  None Available. FINDINGS: There is an acute oblique fracture through the distal fibula at and above the level of the ankle mortise. Alignment of the lateral malleolus appears anatomic, but there is 1 shaft with medial displacement of the proximal fracture fragment. There is medial dislocation of the distal tibia in relation to the talar dome 1.5 cm. There is marked soft tissue swelling surrounding the ankle. IMPRESSION: 1. Acute displaced oblique fracture through the distal fibula. 2. Medial dislocation of the distal tibia in relation to the talar dome. Electronically Signed   By: Darliss Cheney M.D.   On: 04/29/2023 17:52    Procedures Procedures     Medications Ordered in ED Medications  acetaminophen (TYLENOL) tablet 1,000 mg (1,000 mg Oral Given 04/29/23 1957)    ED Course/ Medical Decision Making/ A&P                                 Medical Decision Making Amount and/or Complexity of Data Reviewed Labs: ordered. Radiology: ordered.  Risk OTC drugs.   This patient presents to the ED with chief complaint(s) of left ankle injury with pertinent past medical history of alcohol abuse.  The complaint involves an extensive differential diagnosis  and also carries with it a high risk of complications and morbidity.    The differential diagnosis includes acute fracture or dislocation of ankle, ankle sprain, contusion   The initial plan is to obtain x-ray, baseline labs  Additional history obtained: Additional history obtained from spouse, EMS  Initial Assessment:   On exam, patient is lying supine in the bed and does not appear to be in acute distress.  He is agitated with slurred speech, but is answering questions appropriately.  He is A&Ox3.  Left ankle is in a soft splint placed by EMS.  Splint and ACE wrap removed.  Left ankle with obvious deformity, swelling, and generalized tenderness.  DP pulse 2+.  Foot is neurovascularly intact.  Abrasion with mild surrounding erythema to left knee.  No obvious swelling or deformity to the knee.    Independent ECG/labs interpretation:  The following labs were independently interpreted:  CBC with leukocytosis, which may be reactive.  No anemia.  Metabolic panel with mild hypocalcemia, no other electrolyte disturbance.  Renal function normal.  Ethanol 151.    Independent visualization and interpretation of imaging: I independently visualized the following imaging with scope of interpretation limited to determining acute life threatening conditions related to emergency care: left ankle x-ray, which revealed acute displaced oblique fracture through the distal fibula and medical dislocation  of the distal tibia in relation to talar dome.  There is also marked soft tissue swelling surrounding the ankle.   Consultations obtained: I requested consultation with on-call orthopedic provider and spoke with Dr. Susa Simmonds who reviewed x-rays while discussing patient case on the phone.  Dr. Susa Simmonds agrees with successful reduction and patient may follow up in the office.   Treatment and Reassessment: Patient's ankle was reduced by attending, Dr. Kristine Royal, successfully (confirmed with x-ray).  Patient was placed in a short leg splint.  Patient given Tylenol for pain with mild improvement in pain symptoms.    Upon reassessment of patient, he is sitting up in bed and asking when he will be discharged.  Speech is clear.  He is alert and oriented.  Discussed with patient and wife we will have patient attempt ambulation with crutches.    Patient attempted ambulation with crutches.  RN informed me patient took 2 steps and fell back on the bed and asked for his discharge paperwork.    Disposition:   Discussed with patient and wife there is an increased risk of falls when using crutches which may lead to further injury including but not limited to worsening ankle fracture, re-injury, other fracture, or head injury.  Discussed with patient consuming alcohol will significantly increase his risk of falls and potential injury.  Patient responds "yeah, yeah, yeah. I'm ready to go.".  Patient demonstrates decision making capacity.  Wife is at bedside.  Patient is adamant about discharge home.  Patient's wife comfortable with taking him home despite not being able to ambulate well with crutches.  Advised wife and patient he will need to return to ED if he falls again or is unable to ambulate.  Discussed with patient he cannot bear any weight on his foot and must use the crutches if he is ambulating.  Patient continues to be agitated and requests discharge home.    Patient provided referral information for ortho  follow-up.  The patient has been appropriately medically screened and/or stabilized in the ED. I have low suspicion for any other emergent medical condition which would require further screening, evaluation or treatment in the  ED or require inpatient management. At time of discharge the patient is hemodynamically stable and in no acute distress. I have discussed work-up results and diagnosis with patient and answered all questions. Patient is agreeable with discharge plan. We discussed strict return precautions for returning to the emergency department and they verbalized understanding.     Social Determinants of Health:   Patient's  alcohol abuse, tobacco abuse  increases the complexity of managing their presentation         Final Clinical Impression(s) / ED Diagnoses Final diagnoses:  Closed displaced oblique fracture of shaft of left fibula, initial encounter  Dislocation of distal end of left tibia, initial encounter    Rx / DC Orders ED Discharge Orders     None         Barrie Lyme 04/29/23 2133    Wynetta Fines, MD 04/29/23 2344

## 2023-04-29 NOTE — Discharge Instructions (Addendum)
Thank you for allowing [place] to be a part of your care today.  You were evaluated and treated in the ED for a left ankle fracture.   Your ankle was reduced and placed in a splint.  This splint will stay on until you follow up with the orthopedic provider in the outpatient setting.  Keep this splint clean and dry.  You cannot bear weight on this foot and need to use crutches any time you are up and walking.   Discontinue using alcohol as this will increase your risk of falls which may lead to worsening or re injury of the ankle, new fractures (such as your hip), or potentially a head injury.  Consuming alcohol while attempting to use crutches will greatly increase this risk.   I recommend taking Tylenol [phone] mg every 6-8 hours as needed for pain.  Do not exceed 3000 mg in a 24-hour period.    Return to the ED if you develop sudden worsening of your symptoms, continue to fall and are unable to walk using crutches, or if you have any new concerns.

## 2023-04-29 NOTE — ED Notes (Signed)
Pt took IV out, bleeding controlled at this time. Pt educated on importance of staying in the bed and using call light when needed.

## 2023-04-29 NOTE — ED Triage Notes (Signed)
Pt arrived via EMS, from home. ETOH, twisted ankle while walking. Pain to left ankle.

## 2023-05-02 ENCOUNTER — Encounter: Payer: Self-pay | Admitting: *Deleted

## 2023-05-02 NOTE — Congregational Nurse Program (Signed)
Patient fell in his driveway on loose gravel.  Sustained a fracture left ankle.  Was seen in the ed and splinted.was treated and released to home with wife.    Wife is unable to assist patient up in wheelchair du to her lower back problems.  Wheelchair and knee roller for patient to move around in there home obtained.  Gave assistance with calling [employer], [employer] services and ptar to the wife. Patient has appointment with ortho md on Friday August 23rd to see if ankle is still in alignment.  My phone given to the wife for any future needs.

## 2023-05-05 ENCOUNTER — Telehealth: Payer: Self-pay | Admitting: Cardiovascular Disease

## 2023-05-05 ENCOUNTER — Other Ambulatory Visit: Payer: Self-pay | Admitting: Orthopaedic Surgery

## 2023-05-05 NOTE — Telephone Encounter (Signed)
   Pre-operative Risk Assessment    Patient Name: Jonathan [name]  DOB: 1950/10/25 MRN: [id number]      Request for Surgical Clearance    Procedure:   Open Treatment of the Left Ankle Fracture   Date of Surgery:  Clearance 05/11/23                                 Surgeon: Dr. Nicki Guadalajara Surgeon's Group or Practice Name: [facility] Phone number: [phone] Fax number: [phone]   Type of Clearance Requested:   - Medical  - Pharmacy:  Hold        Type of Anesthesia:  General    Additional requests/questions:  Please advise surgeon/provider what medications should be held.  Barbette Reichmann   05/05/2023, 2:23 PM

## 2023-05-05 NOTE — Telephone Encounter (Signed)
   Name: Jonathan [name]  DOB: November 02, 1950  MRN: [id number]  Primary Cardiologist: Nicki Guadalajara, MD  Chart reviewed as part of pre-operative protocol coverage. Because of KAION ARMINIO past medical history and time since last visit, he will require a follow-up in-office visit in order to better assess preoperative cardiovascular risk.  Pre-op covering staff: - Please schedule appointment and call patient to inform them. If patient already had an upcoming appointment within acceptable timeframe, please add "pre-op clearance" to the appointment notes so provider is aware. - Please contact requesting surgeon's office via preferred method (i.e, phone, fax) to inform them of need for appointment prior to surgery.    Napoleon Form, Leodis Rains, NP  05/05/2023, 3:14 PM

## 2023-05-05 NOTE — Telephone Encounter (Signed)
Pt is scheduled for 05/17/23 at 8:50am with Azalee Course, PA. I will forward him the notes. Requesting office is aware that this appt is after the original date. I spoke with Lurena Joiner

## 2023-05-05 NOTE — Telephone Encounter (Signed)
I left vm with surgery coordinator. The earliest in-office appt available for clearance is 09/03.

## 2023-05-10 ENCOUNTER — Encounter (HOSPITAL_COMMUNITY): Payer: Self-pay | Admitting: Certified Registered"

## 2023-05-11 ENCOUNTER — Encounter (HOSPITAL_COMMUNITY): Admission: RE | Payer: Self-pay | Source: Home / Self Care

## 2023-05-11 ENCOUNTER — Ambulatory Visit (HOSPITAL_COMMUNITY): Admission: RE | Admit: 2023-05-11 | Payer: Medicare Other | Source: Home / Self Care | Admitting: Orthopaedic Surgery

## 2023-05-11 SURGERY — OPEN REDUCTION INTERNAL FIXATION (ORIF) ANKLE FRACTURE
Anesthesia: General | Site: Ankle | Laterality: Left

## 2023-05-15 NOTE — Progress Notes (Signed)
The patient was identified using 2 approved identifiers. All issues noted in this document were discussed and addressed, Mr Jonathan [name] and Mrs Jonathan [name]  voiced understanding and agreement with all preoperative instructions. The patient was emailed the surgery instructions per his request.   Emailed to [email or web address].    The patient was instructed to call  Admitting Office [phone] or [phone]) to complete their Pre-surgical Interview.     COVID Vaccine received:  []  No [x]  Yes Date of any COVID positive Test in last 90 days:  none  PCP - Windle Guard, MD Cardiologist - Nicki Guadalajara, MD     Has appt with Azalee Course, PA on 05-17-2023 for cardiac clearance  Chest x-ray - 10-29-2022  2v  Epic EKG -  10-29-2022  Epic Stress Test -  ECHO - 10-14-2020  Epic Cardiac Cath - multiple-  last on 10-14-2020  Dr. 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 Vickey Sages  PCR screen: []  Ordered & Completed           []   No Order but Needs PROFEND           [x]   N/A for this surgery  Surgery Plan:  [x]  Ambulatory   []  Outpatient in bed  []  Admit  Anesthesia:    [x]  General  []  Spinal  []   Choice []   MAC  Pacemaker / ICD device [x]  No []  Yes   Spinal Cord Stimulator:[x]  No []  Yes       History of Sleep Apnea? [x]  No []  Yes   CPAP used?- [x]  No []  Yes  Does use home O2 per [place] at 4 L at night when needed. Doesn't use during the day   O2 usually stays around 95-96  Does the patient monitor blood sugar?  []  No []  Yes  [x]  N/A  Patient has: [x]  NO Hx DM   []  Pre-DM   []  DM1  []   DM2      Blood Thinner / Instructions:  Plavix   ??? Hold, cardiac clearance appt was on 9-4 but got moved to [place] today, 05-16-2023 d/t Azalee Course, PA's sickness.   Patient to ask about Plavix and ASA.   Aspirin Instructions:  ASA 81 mg  ERAS Protocol Ordered: []  No  [x]  Yes PRE-SURGERY [x]  ENSURE  []  G2   []  No Drink Ordered  Patient is to be NPO after: 07:30  am  Activity level: Patient is unable  to climb a flight of stairs without difficulty; [x]  No CP but would have SOB.   Patient can normally perform ADLs without assistance.   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  Patient verbalized understanding and agreement to the Pre-Surgical Instructions that were given to them at this PAT appointment. Patient was also educated of the need to review these PAT instructions again prior to his surgery.I reviewed the appropriate phone numbers to call if they have any and questions or concerns.

## 2023-05-15 NOTE — Patient Instructions (Signed)
SURGICAL WAITING ROOM VISITATION Patients having surgery or a procedure may have no more than 2 support people in the waiting area - these visitors may rotate in the visitor waiting room.   Due to an increase in RSV and influenza rates and associated hospitalizations, children ages 46 and under may not visit patients in [facility] hospitals. If the patient needs to stay at the hospital during part of their recovery, the visitor guidelines for inpatient rooms apply.  PRE-OP VISITATION  Pre-op nurse will coordinate an appropriate time for 1 support person to accompany the patient in pre-op.  This support person may not rotate.  This visitor will be contacted when the time is appropriate for the visitor to come back in the pre-op area.  Please refer to the [facility] website for the visitor guidelines for Inpatients (after your surgery is over and you are in a regular room).  You are not required to quarantine at this time prior to your surgery. However, you must do this: Hand Hygiene often Do NOT share personal items Notify your provider if you are in close contact with someone who has COVID or you develop fever 100.4 or greater, new onset of sneezing, cough, sore throat, shortness of breath or body aches.  If you test positive for Covid or have been in contact with anyone that has tested positive in the last 10 days please notify you surgeon.    Your procedure is scheduled on:  Thursday   May 18, 2023  Report to [facility] Main Entrance: [place] entrance where the [employer] is available.   Report to admitting at:  08:15   AM  Call this number if you have any questions or problems the morning of surgery [phone]  Do not eat food after Midnight the night prior to your surgery/procedure.  After Midnight you may have the following liquids until   07:30  AM DAY OF SURGERY  Clear Liquid Diet Water Black Coffee (sugar ok, NO MILK/CREAM OR CREAMERS)  Tea (sugar ok, NO  MILK/CREAM OR CREAMERS) regular and decaf                             Plain Jell-O  with no fruit (NO RED)                                           Fruit ices (not with fruit pulp, NO RED)                                     Popsicles (NO RED)                                                                  Juice: NO CITRUS JUICES: only apple, WHITE grape, WHITE cranberry Sports drinks like Gatorade or Powerade (NO RED)                   The day of surgery:  Drink ONE (1) Pre-Surgery Clear Ensure at  07:30 AM the morning  of surgery. Drink in one sitting. Do not sip.  This drink was given to you during your hospital pre-op appointment visit. Nothing else to drink after completing the Pre-Surgery Clear Ensure : No candy, chewing gum or throat lozenges.    FOLLOW ANY ADDITIONAL PRE OP INSTRUCTIONS YOU RECEIVED FROM YOUR SURGEON'S OFFICE!!!   Oral Hygiene is also important to reduce your risk of infection.        Remember - BRUSH YOUR TEETH THE MORNING OF SURGERY WITH YOUR REGULAR TOOTHPASTE  Do NOT smoke after Midnight the night before surgery.  STOP TAKING all Vitamins, Herbs and supplements 1 week before your surgery.   Take ONLY these medicines the morning of surgery with A SIP OF WATER: Metoprolol,   You may use your nasal spray, Nebulizer and Albuterol Inhaler if needed.  You may take Tylenol if needed for pain.                     You may not have any metal on your body including jewelry, and body piercing  Do not wear  lotions, powders,  cologne, or deodorant  Men may shave face and neck.  Contacts, Hearing Aids, dentures or bridgework may not be worn into surgery. DENTURES WILL BE REMOVED PRIOR TO SURGERY PLEASE DO NOT APPLY "Poly grip" OR ADHESIVES!!!    Patients discharged on the day of surgery will not be allowed to drive home.  Someone NEEDS to stay with you for the first 24 hours after anesthesia.  Do not bring your home medications to the hospital. The Pharmacy  will dispense medications listed on your medication list to you during your admission in the Hospital.  Special Instructions: Bring a copy of your healthcare power of attorney and living will documents the day of surgery, if you wish to have them scanned into your [place] [facility] Records- EPIC  Please read over the following fact sheets you were given: IF YOU HAVE QUESTIONS ABOUT YOUR PRE-OP INSTRUCTIONS, PLEASE CALL [phone]   [facility] - Preparing for Surgery Before surgery, you can play an important role.  Because skin is not sterile, your skin needs to be as free of germs as possible.  You can reduce the number of germs on your skin by washing with CHG (chlorahexidine gluconate) soap before surgery.  CHG is an antiseptic cleaner which kills germs and bonds with the skin to continue killing germs even after washing. Please DO NOT use if you have an allergy to CHG or antibacterial soaps.  If your skin becomes reddened/irritated stop using the CHG and inform your nurse when you arrive at Short Stay. Do not shave (including legs and underarms) for at least 48 hours prior to the first CHG shower.  You may shave your face/neck.  Please follow these instructions carefully:  1.  Shower with CHG Soap the night before surgery and the  morning of surgery.  2.  If you choose to wash your hair, wash your hair first as usual with your normal  shampoo.  3.  After you shampoo, rinse your hair and body thoroughly to remove the shampoo.                             4.  Use CHG as you would any other liquid soap.  You can apply chg directly to the skin and wash.  Gently with a scrungie or clean washcloth.  5.  Apply the  CHG Soap to your body ONLY FROM THE NECK DOWN.   Do not use on face/ open                           Wound or open sores. Avoid contact with eyes, ears mouth and genitals (private parts).                       Wash face,  Genitals (private parts) with your normal soap.             6.   Wash thoroughly, paying special attention to the area where your  surgery  will be performed.  7.  Thoroughly rinse your body with warm water from the neck down.  8.  DO NOT shower/wash with your normal soap after using and rinsing off the CHG Soap.            9.  Pat yourself dry with a clean towel.            10.  Wear clean pajamas.            11.  Place clean sheets on your bed the night of your first shower and do not  sleep with pets.  ON THE DAY OF SURGERY : Do not apply any lotions/deodorants the morning of surgery.  Please wear clean clothes to the hospital/surgery center.     FAILURE TO FOLLOW THESE INSTRUCTIONS MAY RESULT IN THE CANCELLATION OF YOUR SURGERY  PATIENT SIGNATURE_________________________________  NURSE SIGNATURE__________________________________  ________________________________________________________________________       Jonathan [name]    An incentive spirometer is a tool that can help keep your lungs clear and active. This tool measures how well you are filling your lungs with each breath. Taking long deep breaths may help reverse or decrease the chance of developing breathing (pulmonary) problems (especially infection) following: A long period of time when you are unable to move or be active. BEFORE THE PROCEDURE  If the spirometer includes an indicator to show your best effort, your nurse or respiratory therapist will set it to a desired goal. If possible, sit up straight or lean slightly forward. Try not to slouch. Hold the incentive spirometer in an upright position. INSTRUCTIONS FOR USE  Sit on the edge of your bed if possible, or sit up as far as you can in bed or on a chair. Hold the incentive spirometer in an upright position. Breathe out normally. Place the mouthpiece in your mouth and seal your lips tightly around it. Breathe in slowly and as deeply as possible, raising the piston or the ball toward the top of the column. Hold your  breath for 3-5 seconds or for as long as possible. Allow the piston or ball to fall to the bottom of the column. Remove the mouthpiece from your mouth and breathe out normally. Rest for a few seconds and repeat Steps 1 through 7 at least 10 times every 1-2 hours when you are awake. Take your time and take a few normal breaths between deep breaths. The spirometer may include an indicator to show your best effort. Use the indicator as a goal to work toward during each repetition. After each set of 10 deep breaths, practice coughing to be sure your lungs are clear. If you have an incision (the cut made at the time of surgery), support your incision when coughing by placing a pillow or rolled up towels firmly against it.  Once you are able to get out of bed, walk around indoors and cough well. You may stop using the incentive spirometer when instructed by your caregiver.  RISKS AND COMPLICATIONS Take your time so you do not get dizzy or light-headed. If you are in pain, you may need to take or ask for pain medication before doing incentive spirometry. It is harder to take a deep breath if you are having pain. AFTER USE Rest and breathe slowly and easily. It can be helpful to keep track of a log of your progress. Your caregiver can provide you with a simple table to help with this. If you are using the spirometer at home, follow these instructions: SEEK MEDICAL CARE IF:  You are having difficultly using the spirometer. You have trouble using the spirometer as often as instructed. Your pain medication is not giving enough relief while using the spirometer. You develop fever of 100.5 F (38.1 C) or higher.                                                                                                    SEEK IMMEDIATE MEDICAL CARE IF:  You cough up bloody sputum that had not been present before. You develop fever of 102 F (38.9 C) or greater. You develop worsening pain at or near the incision  site. MAKE SURE YOU:  Understand these instructions. Will watch your condition. Will get help right away if you are not doing well or get worse. Document Released: 01/09/2007 Document Revised: 11/21/2011 Document Reviewed: 03/12/2007 [facility] Patient Information 2014 [place], [place].

## 2023-05-16 ENCOUNTER — Encounter (HOSPITAL_COMMUNITY)
Admission: RE | Admit: 2023-05-16 | Discharge: 2023-05-16 | Disposition: A | Discharge status: Home / Self Care | Payer: Medicare Other | Source: Ambulatory Visit | Attending: Orthopaedic Surgery | Admitting: Orthopaedic Surgery

## 2023-05-16 ENCOUNTER — Telehealth: Payer: Self-pay | Admitting: *Deleted

## 2023-05-16 ENCOUNTER — Encounter (HOSPITAL_COMMUNITY): Payer: Self-pay

## 2023-05-16 ENCOUNTER — Ambulatory Visit (HOSPITAL_BASED_OUTPATIENT_CLINIC_OR_DEPARTMENT_OTHER): Payer: Medicare Other | Admitting: Family

## 2023-05-16 ENCOUNTER — Other Ambulatory Visit: Payer: Self-pay

## 2023-05-16 ENCOUNTER — Encounter (HOSPITAL_BASED_OUTPATIENT_CLINIC_OR_DEPARTMENT_OTHER): Payer: Self-pay | Admitting: Family

## 2023-05-16 ENCOUNTER — Ambulatory Visit: Payer: Medicare Other | Admitting: Physician Assistant

## 2023-05-16 VITALS — BP 116/60 | HR 57 | Ht 68.0 in | Wt 196.0 lb

## 2023-05-16 DIAGNOSIS — I2510 Atherosclerotic heart disease of native coronary artery without angina pectoris: Secondary | ICD-10-CM

## 2023-05-16 DIAGNOSIS — Z951 Presence of aortocoronary bypass graft: Secondary | ICD-10-CM

## 2023-05-16 DIAGNOSIS — E785 Hyperlipidemia, unspecified: Secondary | ICD-10-CM | POA: Diagnosis not present

## 2023-05-16 DIAGNOSIS — Z01810 Encounter for preprocedural cardiovascular examination: Secondary | ICD-10-CM

## 2023-05-16 HISTORY — DX: Alcohol abuse, uncomplicated: F10.10

## 2023-05-16 MED ORDER — CLOPIDOGREL BISULFATE 75 MG PO TABS: 75.0000 mg | ORAL_TABLET | Freq: Every day | ORAL | 3 refills | Status: DC

## 2023-05-16 NOTE — Progress Notes (Signed)
Cardiology Office Note:  .   Date:  05/16/2023  ID:  Jonathan [name], DOB 07-08-1951, MRN [id number] PCP: Kaleen Mask, MD  [facility] Providers Cardiologist:  Nicki Guadalajara, MD    History of Present Illness: .   Jonathan [name] is a 72 y.o. male CAD s/p CABG, tobacco use, HTN, HLD, COPD, CVA.   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 Statin intolerance with elevated LFTs and CPK. Eventually started on Repatha as lipid not controlled on WelChol, fenofibrate, and Zetia. Myoview 11/2011 no scar nor ischemia with minimal residual basilar inferior thinning, LVEF 57%. Admitted 07/2019 NSTEMI with LHC with significant multivessel disease, he initially did not want CABG but it was eventually performed 10/2020 LIMA-LAD, pedicles RIMA-R posterior lateral coronary artery, SVG-OM1 branch of Cx, SVG-first diagonal branch. Course complicated by postoperative small CVA with small infarct in left posterior cerebellar hemisphere.   Admitted 10/2022 for community-acquired pneumonia and severe sepsis as well as COPD exacerbation.  ED visit 04/29/23 with ankle pain after fall on new gravel in his driveway. X-ray with acute displaced oblique fracture through distal fibula. Pending open treatment of left ankle fracture.   Presents today for follow up with his wife. Prior to mechanical fall very active around his home/property. Reports no shortness of breath nor dyspnea on exertion. Reports no chest pain, pressure, or tightness. No edema, orthopnea, PND. Reports no palpitations.    ROS: Please see the history of present illness.    All other systems reviewed and are negative.   Studies Reviewed: [name]   EKG Interpretation Date/Time:  Tuesday May 16 2023 16:01:02 EDT Ventricular Rate:  57 PR Interval:  184 QRS Duration:  98 QT Interval:  458 QTC Calculation: 445 R Axis:   90  Text Interpretation: Sinus bradycardia Rightward axis Confirmed by Gillian Shields ([id number])  on 05/16/2023 4:11:11 PM    Cardiac Studies & Procedures   CARDIAC CATHETERIZATION  CARDIAC CATHETERIZATION 10/14/2020  Narrative  1st Diag lesion is 90% stenosed.  Mid LAD lesion is 70% stenosed.  Prox RCA to Mid RCA lesion is 85% stenosed.  Prox RCA lesion is 80% stenosed with 30% stenosed side branch in RV Branch.  Mid RCA to Dist RCA lesion is 70% stenosed.  Ost LM lesion is 45% stenosed.  Ost RCA to Prox RCA lesion is 90% stenosed.  Ost Cx lesion is 99% stenosed.  Severe multivessel progressive CAD with 40 to 50% ostial left main stenosis, diffuse 95% stenosis in the first diagonal branch of the LAD with 70% LAD stenosis beyond this diagonal vessel; 99% ostial left circumflex stenosis and progressive 85% in-stent restenosis in a dominant RCA with 90% stenosis at the initiation of the proximal stent with 70% stenosis proximal to the AV groove.  Relatively preserved LV function with EF estimated at 50 to 55%.  LVEDP 22 mm.  RECOMMENDATION: CABG revascularization surgery.  The patient has been on Plavix washout with his last dose on 10/10/2020.  Will initiate IV heparin 8 hours post procedure.  We will plan 2D echo Doppler study today.  Surgical consultation and hopeful CABG revascularization surgery tomorrow if schedule allows.  Findings Coronary Findings Diagnostic  Dominance: Right  Left Main Ost LM lesion is 45% stenosed.  Left Anterior Descending Mid LAD lesion is 70% stenosed.  First Diagonal Branch Vessel is small in size. 1st Diag lesion is 90% stenosed.  Left Circumflex Ost Cx lesion is 99% stenosed.  Second [employer]  Marginal Branch Vessel is small in size.  Right Coronary Artery Ost RCA to Prox RCA lesion is 90% stenosed. Prox RCA lesion is 80% stenosed with 30% stenosed side branch in RV Branch. Prox RCA to Mid RCA lesion is 85% stenosed. The lesion was previously treated. Mid RCA to Dist RCA lesion is 70% stenosed.  Intervention  No interventions  have been documented.   CARDIAC CATHETERIZATION  CARDIAC CATHETERIZATION 07/22/2019  Narrative  Prox RCA to Mid RCA lesion is 80% stenosed.  Prox RCA lesion is 40% stenosed with 30% stenosed side branch in RV Branch.  Mid RCA to Dist RCA lesion is 70% stenosed.  Ost Cx to Prox Cx lesion is 95% stenosed.  1st Diag lesion is 90% stenosed.  Mid LAD lesion is 70% stenosed.  Significant multivessel CAD with 90% diffuse proximal stenosis in the first diagonal branch of the LAD with 70% LAD stenosis beyond the diagonal takeoff; 95% ostial left circumflex stenosis with the vessel immediately bifurcating into the AV groove and circumflex; and mild to moderate in-stent narrowing of approximately 40% in the previously placed proximal RCA stent with focal 80% mid distal in-stent restenosis and new 70% stenosis beyond the stented segment proximal to the acute margin and a large dominant RCA.  Low normal global LV contractility with EF estimate at 50 to 55% with mild residual inferior hypocontractility sustained in 2005.  LVEDP 15 mm  RECOMMENDATION: I had a long discussion with the patient prior to the catheterization and he agreed to rescind his DNR for the procedure in the event countershock or CPR was necessary.  After review of the angiographic findings in detail with the patient as well as the description of good target vessels the patient now seems amenable to consider possible CABG revascularization surgery.  Will obtain surgical consultation.  Plavix will be held; consider P212 testing in several days if the plan is to move forward with CABG revascularization surgery.  Will initiate heparin therapy 8 hours post TR band.  Findings Coronary Findings Diagnostic  Dominance: Right  Left Anterior Descending Mid LAD lesion is 70% stenosed.  First Diagonal Branch 1st Diag lesion is 90% stenosed.  Left Circumflex Ost Cx to Prox Cx lesion is 95% stenosed.  Right Coronary Artery Prox RCA  lesion is 40% stenosed with 30% stenosed side branch in RV Branch. Prox RCA to Mid RCA lesion is 80% stenosed. The lesion was previously treated. Mid RCA to Dist RCA lesion is 70% stenosed.  Intervention  No interventions have been documented.   STRESS TESTS  NM MYOCAR MULTI W/SPECT W 11/11/2011   ECHOCARDIOGRAM  ECHOCARDIOGRAM COMPLETE 10/14/2020  Narrative ECHOCARDIOGRAM REPORT    Patient Name:   NAIEM EVERIST Date of Exam: 10/14/2020 Medical Rec #:  [id number]      Height:       68.0 in Accession #:    [id number]     Weight:       220.0 lb Date of Birth:  14-Jul-1951      BSA:          2.128 m Patient Age:    69 years       BP:           95/50 mmHg Patient Gender: M              HR:           50 bpm. Exam Location:  Inpatient  Procedure: 2D Echo, Color Doppler and Cardiac Doppler  Indications:  CAD Native Vessel i25.0  History:        Patient has prior history of Echocardiogram examinations. CAD, COPD; Risk Factors:Hypertension, Dyslipidemia and Current Smoker.  Sonographer:    Irving Burton Senior RDCS Referring Phys: [phone] THOMAS A KELLY   Sonographer Comments: Very poor echo windows due to COPD and lung interference. IMPRESSIONS   1. Technically diffcult study, very limited views 2. Left ventricular ejection fraction, by estimation, is 55 to 60%. The left ventricle has grossly normal function. Left ventricular endocardial border not optimally defined to evaluate regional wall motion. Left ventricular diastolic parameters are indeterminate. 3. Right ventricular systolic function is normal. The right ventricular size is normal. Tricuspid regurgitation signal is inadequate for assessing PA pressure. 4. The mitral valve is grossly normal. No evidence of mitral valve regurgitation. 5. The aortic valve was not well visualized. Aortic valve regurgitation is not visualized. 6. The inferior vena cava is normal in size with greater than 50% respiratory variability, suggesting right  atrial pressure of 3 mmHg.  FINDINGS Left Ventricle: Left ventricular ejection fraction, by estimation, is 55 to 60%. The left ventricle has normal function. Left ventricular endocardial border not optimally defined to evaluate regional wall motion. The left ventricular internal cavity size was normal in size. There is no left ventricular hypertrophy. Left ventricular diastolic parameters are indeterminate.  Right Ventricle: The right ventricular size is normal. No increase in right ventricular wall thickness. Right ventricular systolic function is normal. Tricuspid regurgitation signal is inadequate for assessing PA pressure.  Left Atrium: Left atrial size was not well visualized.  Right Atrium: Right atrial size was not well visualized.  Pericardium: Trivial pericardial effusion is present. Presence of pericardial fat pad.  Mitral Valve: The mitral valve is grossly normal. No evidence of mitral valve regurgitation.  Tricuspid Valve: The tricuspid valve is grossly normal. Tricuspid valve regurgitation is not demonstrated.  Aortic Valve: The aortic valve was not well visualized. Aortic valve regurgitation is not visualized.  Pulmonic Valve: The pulmonic valve was not well visualized. Pulmonic valve regurgitation not well visualized.  Aorta: The aortic root was not well visualized.  Venous: The inferior vena cava is normal in size with greater than 50% respiratory variability, suggesting right atrial pressure of 3 mmHg.  IAS/Shunts: The interatrial septum was not well visualized.    Diastology LV e' medial:    5.55 cm/s LV E/e' medial:  11.7 LV e' lateral:   8.16 cm/s LV E/e' lateral: 8.0   RIGHT VENTRICLE RV S prime:     8.98 cm/s TAPSE (M-mode): 1.7 cm  LEFT ATRIUM             Index       RIGHT ATRIUM           Index LA Vol (A2C):   59.9 ml 28.15 ml/m RA Area:     17.10 cm LA Vol (A4C):   39.0 ml 18.33 ml/m RA Volume:   56.20 ml  26.41 ml/m LA Biplane Vol: 48.1 ml  22.60 ml/m AORTIC VALVE LVOT Vmax:   106.00 cm/s LVOT Vmean:  70.400 cm/s LVOT VTI:    0.291 m  MITRAL VALVE MV Area (PHT): 2.48 cm    SHUNTS MV Decel Time: 306 msec    Systemic VTI: 0.29 m MV E velocity: 65.10 cm/s MV A velocity: 75.00 cm/s MV E/A ratio:  0.87  Epifanio Lesches MD Electronically signed by Epifanio Lesches MD Signature Date/Time: 10/14/2020/8:41:58 PM    Final   TEE  ECHO INTRAOPERATIVE  TEE 10/15/2020  Narrative *INTRAOPERATIVE TRANSESOPHAGEAL REPORT *    Patient Name:   SONJA STIEG Date of Exam: 10/15/2020 Medical Rec #:  [id number]      Height:       68.0 in Accession #:    [id number]     Weight:       220.0 lb Date of Birth:  [birth year]      BSA:          2.13 m Patient Age:    69 years       BP:           126/62 mmHg Patient Gender: M              HR:           59 bpm. Exam Location:  Anesthesiology  Transesophogeal exam was perform intraoperatively during surgical procedure. Patient was closely monitored under general anesthesia during the entirety of examination.  Indications:     Coronary Artery Disease Sonographer:     Eulah Pont RDCS Performing Phys: Leslye Peer MD Diagnosing Phys: Leslye Peer MD  Complications: No known complications during this procedure. POST-OP IMPRESSIONS - Left Ventricle: The left ventricle is unchanged from pre-bypass. - Right Ventricle: The right ventricle appears unchanged from pre-bypass. - Aorta: The aorta appears unchanged from pre-bypass. - Left Atrium: The left atrium appears unchanged from pre-bypass. - Left Atrial Appendage: The left atrial appendage appears unchanged from pre-bypass. - Aortic Valve: The aortic valve appears unchanged from pre-bypass. - Mitral Valve: The mitral valve appears unchanged from pre-bypass. - Tricuspid Valve: The tricuspid valve appears unchanged from pre-bypass. - Interatrial Septum: The interatrial septum appears unchanged from pre-bypass. - Interventricular  Septum: The interventricular septum appears unchanged from pre-bypass. - Pericardium: The pericardium appears unchanged from pre-bypass.  PRE-OP FINDINGS Left Ventricle: The left ventricle has normal systolic function, with an ejection fraction of 55-60%. The cavity size was normal. There is mildly increased left ventricular wall thickness.  Right Ventricle: The right ventricle has normal systolic function. The cavity was normal. There is no increase in right ventricular wall thickness.  Left Atrium: Left atrial size was dilated.  Right Atrium: Right atrial size was normal in size.  Interatrial Septum: No atrial level shunt detected by color flow Doppler.  Pericardium: There is no evidence of pericardial effusion.  Mitral Valve: The mitral valve is normal in structure. Mitral valve regurgitation is trivial by color flow Doppler. There is No evidence of mitral stenosis.  Tricuspid Valve: The tricuspid valve was normal in structure. Tricuspid valve regurgitation was not visualized by color flow Doppler.  Aortic Valve: The aortic valve is tricuspid Aortic valve regurgitation was not visualized by color flow Doppler. There is no evidence of aortic valve stenosis.  Pulmonic Valve: The pulmonic valve was not well visualized. Pulmonic valve regurgitation was not assessed by color flow Doppler.   Aorta: The aortic root, ascending aorta and aortic arch are normal in size and structure. There is evidence of protruding immobile plaque in the descending aorta; Grade III, measuring 3-83mm in size.   Leslye Peer MD Electronically signed by Leslye Peer MD Signature Date/Time: 10/15/2020/1:05:49 PM    Final            Risk Assessment/Calculations:             Physical Exam:   VS:  BP 116/60   Pulse (!) 57   Ht 5\' 8"  (1.727 m)   Wt 196 lb (88.9 kg)  SpO2 97%   BMI 29.80 kg/m    Wt Readings from Last 3 Encounters:  05/16/23 196 lb (88.9 kg)  04/29/23 215 lb (97.5 kg)  12/12/22 209  lb (94.8 kg)    GEN: Well nourished, well developed in no acute distress NECK: No JVD; No carotid bruits CARDIAC: RRR, no murmurs, rubs, gallops RESPIRATORY: All lung fields with mild inspiratory wheeze. ABDOMEN: Soft, non-tender, non-distended EXTREMITIES:  No edema; No deformity   ASSESSMENT AND PLAN: .    Preop clearance - Pending left ankle surgery. According to the Revised Cardiac Risk Index (RCRI), his Perioperative Risk of Major Cardiac Event is (%): 6.6. His Functional Capacity in METs is: 5.19 according to the Duke Activity Status Index (DASI). Per AHA/ACC guidelines, he is deemed acceptable risk for the planned procedure 05/18/23 without additional cardiovascular testing. Will route to surgical team so they are aware.  Hold Plavix tomorrow and Thursday prior to surgery. Resume post procedure. Continue aspirin throughout peri procedural period.   CAD / HLD - Stable with no anginal symptoms. No indication for ischemic evaluation.  Statin intolerance elevated LFTs, CPK. GDMT Aspirin, Plavix, Repatha, Metoprolol. 12/2022 LDL 61, triglycerides 168. Encouraged to increase activity after ankle surgery and reduce carbohydrate intake.   COPD / Tobacco use - Follows with Dr. Francine Graven. No evidence of exacerbation.        Dispo: follow up as scheduled with Dr. Tresa Endo  Signed, Alver Sorrow, NP

## 2023-05-16 NOTE — Patient Instructions (Signed)
Medication Instructions:  Your physician recommends that you continue on your current medications as directed. Please refer to the Current Medication list given to you today.  HOLD PLAVIX WEDNESDAY & THURSDAY    Follow-Up: At [facility], you and your health needs are our priority.  As part of our continuing mission to provide you with exceptional heart care, we have created designated Provider Care Teams.  These Care Teams include your primary Cardiologist (physician) and Advanced Practice Providers (APPs -  Physician Assistants and Nurse Practitioners) who all work together to provide you with the care you need, when you need it.  We recommend signing up for the patient portal called "MyChart".  Sign up information is provided on this After Visit Summary.  MyChart is used to connect with patients for Virtual Visits (Telemedicine).  Patients are able to view lab/test results, encounter notes, upcoming appointments, etc.  Non-urgent messages can be sent to your provider as well.   To learn more about what you can do with MyChart, go to [email or web address].    Your next appointment:   FOLLOW UP AS SCHEDULED

## 2023-05-16 NOTE — Telephone Encounter (Signed)
Spoke with patient and wife to reschedule preop clearance appointment on 05/17/23 at the request of H. Meng, PA-C. Pt agreeable to moving appt to today at 10:30am with T. Asa Lente, PA-C. Provided [employer] office address and directions. Pt and wife in agreement with plan.

## 2023-05-17 ENCOUNTER — Encounter (HOSPITAL_COMMUNITY): Payer: Self-pay

## 2023-05-17 ENCOUNTER — Ambulatory Visit: Payer: Medicare Other | Admitting: Physician Assistant

## 2023-05-17 NOTE — Anesthesia Preprocedure Evaluation (Signed)
Anesthesia Evaluation    Airway        Dental   Pulmonary former smoker          Cardiovascular hypertension,      Neuro/Psych    GI/Hepatic   Endo/Other    Renal/GU      Musculoskeletal   Abdominal   Peds  Hematology   Anesthesia Other Findings   Reproductive/Obstetrics                              Anesthesia Physical Anesthesia Plan  ASA:   Anesthesia Plan:    Post-op Pain Management:    Induction:   PONV Risk Score and Plan:   Airway Management Planned:   Additional Equipment:   Intra-op Plan:   Post-operative Plan:   Informed Consent:   Plan Discussed with:   Anesthesia Plan Comments: (See PAT note from 9/3 by Sherlie Ban PA-C )         Anesthesia Quick Evaluation

## 2023-05-17 NOTE — Progress Notes (Signed)
Choose an anesthesia record to view details        DISCUSSION: Jonathan [name] is a 72 year old male who presents to PAT prior to ORIF of the left ankle on 05/18/2023 with Dr. Susa Simmonds.  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  Patient had a mechanical fall on 04/29/2023 and sustained an ankle fracture.  It was reduced in the ED.  Patient referred for cardiac risk assessment and clearance due to extensive cardiac history.  He was seen on 05/16/2023. Pt denied any cardiac symptoms: "Preop clearance - Pending left ankle surgery. According to the Revised Cardiac Risk Index (RCRI), his Perioperative Risk of Major Cardiac Event is (%): 6.6. His Functional Capacity in METs is: 5.19 according to the Duke Activity Status Index (DASI). Per AHA/ACC guidelines, he is deemed acceptable risk for the planned procedure 05/18/23 without additional cardiovascular testing. Will route to surgical team so they are aware.  Hold Plavix tomorrow and Thursday prior to surgery. Resume post procedure. Continue aspirin throughout peri procedural period. "  Pt with hx of COPD and current tobacco use. Followed by Pulmonology and last seen in clinic on 12/22/22. Uses inhalers and on 4L O2 at night prn. Admitted 10/2022 for community-acquired pneumonia and severe sepsis as well as COPD exacerbation. Breathing noted to be stable during Cardiology visit on 9/3.  VS:     05/16/2023    3:55 PM 04/29/2023    5:30 PM 12/12/2022   10:44 AM  Vitals with BMI  Height 5\' 8"  5\' 7"  5\' 7"   Weight 196 lbs 215 lbs 209 lbs  BMI 29.81 33.67 32.73  Systolic 116 120 166  Diastolic 60 64 72  Pulse 57 74 69     PROVIDERS: Kaleen Mask, MD Cardiology: Pulmonolgy:  LABS: Labs reviewed: Acceptable for surgery. WBC 18 - likely trauma related (all labs ordered are listed,  but only abnormal results are displayed)  Labs Reviewed - No data to display   IMAGES:  L ankle 04/29/23:  IMPRESSION: 1. Acute displaced oblique fracture through the distal fibula. 2. Medial dislocation of the distal tibia in relation to the talar dome.     EKG 05/16/23:  Sinus bradycardia, rate 57 Rightward axis    CV:  Echo 10/14/2020:  IMPRESSIONS     1. Technically diffcult study, very limited views   2. Left ventricular ejection fraction, by estimation, is 55 to 60%. The  left ventricle has grossly normal function. Left ventricular endocardial  border not optimally defined to evaluate regional wall motion. Left  ventricular diastolic parameters are  indeterminate.   3. Right ventricular systolic function is normal. The right ventricular  size is normal. Tricuspid regurgitation signal is inadequate for assessing  PA pressure.   4. The mitral valve is grossly normal. No evidence of mitral valve  regurgitation.   5. The aortic valve was not well visualized. Aortic valve regurgitation  is not visualized.   6. The inferior vena cava is normal in size with greater than 50%  respiratory variability, suggesting right atrial pressure of 3 mmHg.   Left heart cath 10/14/2020:  1st Diag lesion is 90% stenosed. Mid LAD lesion is 70% stenosed. Prox RCA to Mid RCA lesion is 85% stenosed. Prox RCA lesion is 80% stenosed with 30% stenosed side branch in RV Branch. Mid RCA to Dist RCA lesion is 70% stenosed. Suezanne Jacquet  LM lesion is 45% stenosed. Ost RCA to Prox RCA lesion is 90% stenosed. Ost Cx lesion is 99% stenosed.   Severe multivessel progressive CAD with 40 to 50% ostial left main stenosis, diffuse 95% stenosis in the first diagonal branch of the LAD with 70% LAD stenosis beyond this diagonal vessel; 99% ostial left circumflex stenosis and progressive 85% in-stent restenosis in a dominant RCA with 90% stenosis at the initiation of the proximal stent with 70% stenosis proximal to  the AV groove.   Relatively preserved LV function with EF estimated at 50 to 55%.  LVEDP 22 mm.   RECOMMENDATION: CABG revascularization surgery.  The patient has been on Plavix washout with his last dose on 10/10/2020.  Will initiate IV heparin 8 hours post procedure.  We will plan 2D echo Doppler study today.  Surgical consultation and hopeful CABG revascularization surgery tomorrow if schedule allows.  Past Medical History:  Diagnosis Date   Arthritis    hands    Chronic bronchitis (HCC)    Chronic sinusitis of both maxillary sinuses 04/16/2018   Sinus CT 04/25/2018 Mild mucoperiosteal thickening involving the frontoethmoidal sinuses. Otherwise largely clear sinuses with no evidence for acute sinusitis.    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 (HCC)    DOE (dyspnea on exertion) 04/16/2018   Essential hypertension, benign 09/23/2012   Changed acei to arb 04/16/2018 due to pseudowheeze > improved 06/08/2018    ETOH abuse    Hernia, inguinal 09/23/2012   Hyperlipidemia    has not tolerated statins,  would not pursue PSK9 inhibitor as advised by Dr Tresa Endo   Hypertension    Myocardial infarction [facility])    NSTEMI (non-ST elevated myocardial infarction) (HCC) 09/23/2012   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   Tobacco abuse     Past Surgical History:  Procedure Laterality Date   CARDIAC CATHETERIZATION     CARDIOVASCULAR STRESS TEST  11/11/2011   CORONARY ANGIOPLASTY      stents- 01/2004    CORONARY ARTERY BYPASS GRAFT N/A 10/15/2020   Procedure: CORONARY ARTERY BYPASS GRAFTING (CABG) TIMES FOUR USING BILATERAL INTERNAL MAMMARY ARTERIES AND ENDOSCOPICALLY HARVESTED GREATER SAPHENOUS VEIN AND INSERTION OF 50CC INTRAAORTIC BALLOON PUMP;  Surgeon: Linden Dolin, MD;  Location: [facility] OR;  Service: Open Heart Surgery;  Laterality: N/A;  possible BIMA   ENDOVEIN HARVEST OF GREATER SAPHENOUS VEIN Right 10/15/2020   Procedure: ENDOVEIN HARVEST OF GREATER SAPHENOUS VEIN;  Surgeon: Linden Dolin, MD;  Location: [facility] OR;  Service: Open Heart Surgery;  Laterality: Right;   EYE SURGERY     heart stents  01/2004   INGUINAL HERNIA REPAIR Right 12/06/2012   Procedure: HERNIA REPAIR INGUINAL ADULT;  Surgeon: Lodema Pilot, DO;  Location: [facility] ORS;  Service: General;  Laterality: Right;   INSERTION OF MESH Right 12/06/2012   Procedure:  INSERTION OF MESH;  Surgeon: Lodema Pilot, DO;  Location: [facility] ORS;  Service: General;  Laterality: Right;   LEFT HEART CATH AND CORONARY ANGIOGRAPHY N/A 07/22/2019   Procedure: LEFT HEART CATH AND CORONARY ANGIOGRAPHY;  Surgeon: Lennette Bihari, MD;  [facility]: [facility] INVASIVE CV LAB;  Service: Cardiovascular;  Laterality: N/A;   LEFT HEART CATH AND CORONARY ANGIOGRAPHY N/A 10/14/2020   Procedure: LEFT HEART CATH AND CORONARY ANGIOGRAPHY;  Surgeon: Lennette Bihari, MD;  [facility] INVAS[facility] CV LAB;  Service: Cardiovascular;  Laterality: N/A;   TEE WITHOUT CARDIOVERSION N/A 10/15/2020   Procedure: TRANSESOPHAGEAL ECHOCARDIOGRAM (TEE);  Surgeon: Linden Dolin, MD;  Location[facility] OR;  Service: Open Heart Surgery;  Laterality: N/A;    MEDICATIONS:  acetaminophen (TYLENOL) 500 MG tablet   albuterol (VENTOLIN HFA) 108 (90 Base) MCG/ACT inhaler   aspirin 81 MG chewable tablet   clopidogrel (PLAVIX) 75 MG tablet   Evolocumab (REPATHA SURECLICK) 140 MG/ML SOAJ   Fluticasone-Umeclidin-Vilant (TRELEGY ELLIPTA) 100-62.5-25 MCG/ACT AEPB   furosemide (LASIX) 80  MG tablet   ipratropium-albuterol (DUONEB) 0.5-2.5 (3) MG/3ML SOLN   metoprolol succinate (TOPROL-XL) 25 MG 24 hr tablet   sodium chloride (OCEAN) 0.65 % SOLN nasal spray   No current facility-administered medications for this encounter.   Marcille Blanco [facility] Surgical Short Stay/Anesthesiology [facility] Phone [phone] 05/17/2023 11:36 AM

## 2023-05-18 ENCOUNTER — Ambulatory Visit (HOSPITAL_COMMUNITY): Payer: Medicare Other

## 2023-05-18 ENCOUNTER — Other Ambulatory Visit: Payer: Self-pay

## 2023-05-18 ENCOUNTER — Ambulatory Visit (HOSPITAL_COMMUNITY): Payer: Medicare Other | Admitting: Medical

## 2023-05-18 ENCOUNTER — Ambulatory Visit (HOSPITAL_COMMUNITY): Payer: Medicare Other | Admitting: Certified Registered"

## 2023-05-18 ENCOUNTER — Encounter (HOSPITAL_COMMUNITY)
Admission: RE | Disposition: A | Discharge status: Home / Self Care | Payer: Self-pay | Source: Home / Self Care | Attending: Orthopaedic Surgery

## 2023-05-18 ENCOUNTER — Encounter (HOSPITAL_COMMUNITY): Payer: Self-pay | Admitting: Orthopaedic Surgery

## 2023-05-18 ENCOUNTER — Ambulatory Visit (HOSPITAL_COMMUNITY)
Admission: RE | Admit: 2023-05-18 | Discharge: 2023-05-18 | Disposition: A | Discharge status: Home / Self Care | Payer: Medicare Other | Attending: Orthopaedic Surgery | Admitting: Orthopaedic Surgery

## 2023-05-18 DIAGNOSIS — J449 Chronic obstructive pulmonary disease, unspecified: Secondary | ICD-10-CM

## 2023-05-18 DIAGNOSIS — Z7951 Long term (current) use of inhaled steroids: Secondary | ICD-10-CM | POA: Diagnosis not present

## 2023-05-18 DIAGNOSIS — Z7902 Long term (current) use of antithrombotics/antiplatelets: Secondary | ICD-10-CM | POA: Insufficient documentation

## 2023-05-18 DIAGNOSIS — I2510 Atherosclerotic heart disease of native coronary artery without angina pectoris: Secondary | ICD-10-CM | POA: Diagnosis not present

## 2023-05-18 DIAGNOSIS — Z87891 Personal history of nicotine dependence: Secondary | ICD-10-CM

## 2023-05-18 DIAGNOSIS — S82842A Displaced bimalleolar fracture of left lower leg, initial encounter for closed fracture: Secondary | ICD-10-CM | POA: Diagnosis present

## 2023-05-18 DIAGNOSIS — I10 Essential (primary) hypertension: Secondary | ICD-10-CM | POA: Insufficient documentation

## 2023-05-18 DIAGNOSIS — Z955 Presence of coronary angioplasty implant and graft: Secondary | ICD-10-CM | POA: Diagnosis not present

## 2023-05-18 DIAGNOSIS — I252 Old myocardial infarction: Secondary | ICD-10-CM | POA: Insufficient documentation

## 2023-05-18 DIAGNOSIS — Z951 Presence of aortocoronary bypass graft: Secondary | ICD-10-CM | POA: Diagnosis not present

## 2023-05-18 DIAGNOSIS — S93432A Sprain of tibiofibular ligament of left ankle, initial encounter: Secondary | ICD-10-CM | POA: Insufficient documentation

## 2023-05-18 DIAGNOSIS — W19XXXA Unspecified fall, initial encounter: Secondary | ICD-10-CM | POA: Diagnosis not present

## 2023-05-18 DIAGNOSIS — Z66 Do not resuscitate: Secondary | ICD-10-CM | POA: Diagnosis not present

## 2023-05-18 HISTORY — PX: ORIF ANKLE FRACTURE: SHX5408

## 2023-05-18 SURGERY — OPEN REDUCTION INTERNAL FIXATION (ORIF) ANKLE FRACTURE
Anesthesia: General | Site: Ankle | Laterality: Left

## 2023-05-18 MED ORDER — LIDOCAINE HCL (PF) 2 % IJ SOLN
INTRAMUSCULAR | Status: AC
  Filled 2023-05-18: qty 5

## 2023-05-18 MED ORDER — POVIDONE-IODINE 10 % EX SWAB: 2.0000 | Freq: Once | CUTANEOUS | Status: DC

## 2023-05-18 MED ORDER — DEXAMETHASONE SODIUM PHOSPHATE 10 MG/ML IJ SOLN
INTRAMUSCULAR | Status: DC | PRN
  Administered 2023-05-18: 10 mg via INTRAVENOUS

## 2023-05-18 MED ORDER — ONDANSETRON HCL 4 MG/2ML IJ SOLN
INTRAMUSCULAR | Status: DC | PRN
  Administered 2023-05-18: 4 mg via INTRAVENOUS

## 2023-05-18 MED ORDER — BUPIVACAINE HCL (PF) 0.5 % IJ SOLN
INTRAMUSCULAR | Status: AC
  Filled 2023-05-18: qty 30

## 2023-05-18 MED ORDER — LIDOCAINE 2% (20 MG/ML) 5 ML SYRINGE
INTRAMUSCULAR | Status: DC | PRN
  Administered 2023-05-18: 80 mg via INTRAVENOUS

## 2023-05-18 MED ORDER — ORAL CARE MOUTH RINSE: 15.0000 mL | Freq: Once | OROMUCOSAL | Status: AC

## 2023-05-18 MED ORDER — CHLORHEXIDINE GLUCONATE 0.12 % MT SOLN
15.0000 mL | Freq: Once | OROMUCOSAL | Status: AC
  Administered 2023-05-18: 15 mL via OROMUCOSAL

## 2023-05-18 MED ORDER — OXYCODONE HCL 5 MG PO TABS: 5.0000 mg | ORAL_TABLET | Freq: Once | ORAL | Status: DC | PRN

## 2023-05-18 MED ORDER — FENTANYL CITRATE (PF) 100 MCG/2ML IJ SOLN
INTRAMUSCULAR | Status: DC | PRN
  Administered 2023-05-18: 25 ug via INTRAVENOUS
  Administered 2023-05-18: 50 ug via INTRAVENOUS
  Administered 2023-05-18: 25 ug via INTRAVENOUS

## 2023-05-18 MED ORDER — PROPOFOL 10 MG/ML IV BOLUS
INTRAVENOUS | Status: AC
  Filled 2023-05-18: qty 20

## 2023-05-18 MED ORDER — MIDAZOLAM HCL 2 MG/2ML IJ SOLN
2.0000 mg | Freq: Once | INTRAMUSCULAR | Status: AC
  Administered 2023-05-18: 1 mg via INTRAVENOUS
  Filled 2023-05-18 (×2): qty 2

## 2023-05-18 MED ORDER — CEFAZOLIN SODIUM-DEXTROSE 2-4 GM/100ML-% IV SOLN
2.0000 g | INTRAVENOUS | Status: AC
  Administered 2023-05-18: 2 g via INTRAVENOUS
  Filled 2023-05-18: qty 100

## 2023-05-18 MED ORDER — OXYCODONE HCL 5 MG/5ML PO SOLN: 5.0000 mg | Freq: Once | ORAL | Status: DC | PRN

## 2023-05-18 MED ORDER — ONDANSETRON HCL 4 MG/2ML IJ SOLN
INTRAMUSCULAR | Status: AC
  Filled 2023-05-18: qty 2

## 2023-05-18 MED ORDER — EPHEDRINE 5 MG/ML INJ
INTRAVENOUS | Status: AC
  Filled 2023-05-18: qty 5

## 2023-05-18 MED ORDER — FENTANYL CITRATE PF 50 MCG/ML IJ SOSY
100.0000 ug | PREFILLED_SYRINGE | Freq: Once | INTRAMUSCULAR | Status: AC
  Administered 2023-05-18: 50 ug via INTRAVENOUS
  Filled 2023-05-18 (×2): qty 2

## 2023-05-18 MED ORDER — AMISULPRIDE (ANTIEMETIC) 5 MG/2ML IV SOLN: 10.0000 mg | Freq: Once | INTRAVENOUS | Status: DC | PRN

## 2023-05-18 MED ORDER — ROPIVACAINE HCL 5 MG/ML IJ SOLN
INTRAMUSCULAR | Status: DC | PRN
Start: 2023-05-18 — End: 2023-05-18
  Administered 2023-05-18: 25 mL via PERINEURAL
  Administered 2023-05-18: 20 mL via PERINEURAL

## 2023-05-18 MED ORDER — LACTATED RINGERS IV SOLN: INTRAVENOUS | Status: DC

## 2023-05-18 MED ORDER — FENTANYL CITRATE (PF) 100 MCG/2ML IJ SOLN
INTRAMUSCULAR | Status: AC
  Filled 2023-05-18: qty 2

## 2023-05-18 MED ORDER — DEXAMETHASONE SODIUM PHOSPHATE 10 MG/ML IJ SOLN
INTRAMUSCULAR | Status: AC
  Filled 2023-05-18: qty 1

## 2023-05-18 MED ORDER — 0.9 % SODIUM CHLORIDE (POUR BTL) OPTIME
TOPICAL | Status: DC | PRN
  Administered 2023-05-18: 1000 mL

## 2023-05-18 MED ORDER — EPHEDRINE SULFATE-NACL 50-0.9 MG/10ML-% IV SOSY
PREFILLED_SYRINGE | INTRAVENOUS | Status: DC | PRN
  Administered 2023-05-18 (×2): 5 mg via INTRAVENOUS

## 2023-05-18 MED ORDER — HYDROMORPHONE HCL 1 MG/ML IJ SOLN: 0.2500 mg | INTRAMUSCULAR | Status: DC | PRN

## 2023-05-18 MED ORDER — PROPOFOL 10 MG/ML IV BOLUS
INTRAVENOUS | Status: DC | PRN
  Administered 2023-05-18: 180 mg via INTRAVENOUS

## 2023-05-18 MED ORDER — ONDANSETRON HCL 4 MG/2ML IJ SOLN: 4.0000 mg | Freq: Once | INTRAMUSCULAR | Status: DC | PRN

## 2023-05-18 SURGICAL SUPPLY — 60 items
APL PRP STRL LF DISP 70% ISPRP (MISCELLANEOUS) ×1
BAG COUNTER SPONGE SURGICOUNT (BAG) IMPLANT
BAG SPNG CNTER NS LX DISP (BAG)
BANDAGE ESMARK 6X9 LF (GAUZE/BANDAGES/DRESSINGS) IMPLANT
BIT DRILL 2 CANN GRADUATED (BIT) IMPLANT
BIT DRILL 2.5 CANN STRL (BIT) IMPLANT
BLADE SURG 15 STRL LF DISP TIS (BLADE) ×1 IMPLANT
BLADE SURG 15 STRL SS (BLADE) ×1
BNDG CMPR 5X4 CHSV STRCH STRL (GAUZE/BANDAGES/DRESSINGS)
BNDG CMPR 6 X 5 YARDS HK CLSR (GAUZE/BANDAGES/DRESSINGS) ×2
BNDG CMPR 9X6 STRL LF SNTH (GAUZE/BANDAGES/DRESSINGS)
BNDG CMPR MED 15X6 ELC VLCR LF (GAUZE/BANDAGES/DRESSINGS) ×1
BNDG COHESIVE 4X5 TAN STRL LF (GAUZE/BANDAGES/DRESSINGS) IMPLANT
BNDG ELASTIC 6INX 5YD STR LF (GAUZE/BANDAGES/DRESSINGS) ×2 IMPLANT
BNDG ELASTIC 6X15 VLCR STRL LF (GAUZE/BANDAGES/DRESSINGS) IMPLANT
BNDG ESMARK 6X9 LF (GAUZE/BANDAGES/DRESSINGS)
CHLORAPREP W/TINT 26 (MISCELLANEOUS) ×1 IMPLANT
COVER MAYO STAND STRL (DRAPES) IMPLANT
CUFF TOURN SGL QUICK 34 (TOURNIQUET CUFF) ×1
CUFF TRNQT CYL 34X4.125X (TOURNIQUET CUFF) ×1 IMPLANT
DRAPE C-ARM 42X120 X-RAY (DRAPES) IMPLANT
DRAPE C-ARMOR (DRAPES) IMPLANT
DRAPE U-SHAPE 47X51 STRL (DRAPES) ×1 IMPLANT
ELECT REM PT RETURN 15FT ADLT (MISCELLANEOUS) ×1 IMPLANT
GAUZE SPONGE 4X4 12PLY STRL (GAUZE/BANDAGES/DRESSINGS) ×1 IMPLANT
GAUZE XEROFORM 1X8 LF (GAUZE/BANDAGES/DRESSINGS) ×1 IMPLANT
GLOVE BIOGEL PI IND STRL 8 (GLOVE) ×1 IMPLANT
GLOVE SURG LX STRL 7.5 STRW (GLOVE) ×1 IMPLANT
GOWN STRL REUS W/ TWL XL LVL3 (GOWN DISPOSABLE) ×1 IMPLANT
GOWN STRL REUS W/TWL XL LVL3 (GOWN DISPOSABLE) ×1
KIT BASIN OR (CUSTOM PROCEDURE TRAY) ×1 IMPLANT
KIT TURNOVER KIT A (KITS) IMPLANT
NS IRRIG 1000ML POUR BTL (IV SOLUTION) ×1 IMPLANT
PACK ORTHO EXTREMITY (CUSTOM PROCEDURE TRAY) ×1 IMPLANT
PAD CAST 4YDX4 CTTN HI CHSV (CAST SUPPLIES) ×1 IMPLANT
PADDING CAST COTTON 4X4 STRL (CAST SUPPLIES) ×1
PADDING CAST COTTON 6X4 STRL (CAST SUPPLIES) IMPLANT
PADDING CAST SYNTHETIC 4X4 STR (CAST SUPPLIES) ×2 IMPLANT
PENCIL SMOKE EVACUATOR (MISCELLANEOUS) ×1 IMPLANT
PLATE LOCK DIST FIB 5H TTNIUM (Plate) IMPLANT
SCREW CORT LP ANKLE 3.5X55 (Screw) IMPLANT
SCREW LOCK COMP 3X16 (Screw) IMPLANT
SCREW LP TI 3.5X14MM (Screw) IMPLANT
SCREW VAL KREULOCK 3.0X12 TI (Screw) IMPLANT
SCREW VAL KREULOCK 3.0X14 TI (Screw) IMPLANT
SLEEVE SCD COMPRESS KNEE MED (STOCKING) ×1 IMPLANT
SPIKE FLUID TRANSFER (MISCELLANEOUS) IMPLANT
SPLINT PLASTER CAST XFAST 5X30 (CAST SUPPLIES) ×20 IMPLANT
STAPLER VISISTAT 35W (STAPLE) IMPLANT
STRIP CLOSURE SKIN 1/2X4 (GAUZE/BANDAGES/DRESSINGS) IMPLANT
SUCTION TUBE FRAZIER 10FR DISP (SUCTIONS) ×1 IMPLANT
SUT ETHILON 3 0 PS 1 (SUTURE) IMPLANT
SUT MNCRL AB 3-0 PS2 18 (SUTURE) IMPLANT
SUT PDS AB 2-0 CT2 27 (SUTURE) IMPLANT
SUT VIC AB 2-0 SH 27 (SUTURE)
SUT VIC AB 2-0 SH 27XBRD (SUTURE) IMPLANT
SUT VIC AB 3-0 FS2 27 (SUTURE) ×1 IMPLANT
TOWEL OR 17X26 10 PK STRL BLUE (TOWEL DISPOSABLE) ×1 IMPLANT
UNDERPAD 30X36 HEAVY ABSORB (UNDERPADS AND DIAPERS) ×1 IMPLANT
YANKAUER SUCT BULB TIP 10FT TU (MISCELLANEOUS) ×1 IMPLANT

## 2023-05-18 NOTE — H&P (Signed)
PREOPERATIVE H&P  Chief Complaint: Left ankle pain  HPI: Jonathan [name] is a 72 y.o. male who presents for preoperative history and physical with a diagnosis of left bimalleolar ankle fracture with possible syndesmosis disruption.  He sustained this after a fall.  He was seen in the ER where a splint was placed.  He he is here today for surgery.. Symptoms are rated as moderate to severe, and have been worsening.  This is significantly impairing activities of daily living.  He has elected for surgical management.   Past Medical History:  Diagnosis Date   Arthritis    hands    Chronic bronchitis (HCC)    Chronic sinusitis of both maxillary sinuses 04/16/2018   Sinus CT 04/25/2018 Mild mucoperiosteal thickening involving the frontoethmoidal sinuses. Otherwise largely clear sinuses with no evidence for acute sinusitis.    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 (HCC)    DOE (dyspnea on exertion) 04/16/2018   Essential hypertension, benign 09/23/2012   Changed acei to arb 04/16/2018 due to pseudowheeze > improved 06/08/2018    ETOH abuse    Hernia, inguinal 09/23/2012   Hyperlipidemia    has not tolerated statins,  would not pursue PSK9 inhibitor as advised by Dr Tresa Endo   Hypertension    Myocardial infarction [facility])    NSTEMI (non-ST elevated myocardial infarction) (HCC) 09/23/2012   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   Tobacco abuse    Past Surgical History:  Procedure Laterality  Date   CARDIAC CATHETERIZATION     CARDIOVASCULAR STRESS TEST  11/11/2011   CORONARY ANGIOPLASTY     stents- 01/2004    CORONARY ARTERY BYPASS GRAFT N/A 10/15/2020   Procedure: CORONARY ARTERY BYPASS GRAFTING (CABG) TIMES FOUR USING BILATERAL INTERNAL MAMMARY ARTERIES AND ENDOSCOPICALLY HARVESTED GREATER SAPHENOUS VEIN AND INSERTION OF 50CC INTRAAORTIC BALLOON PUMP;  Surgeon: Linden Dolin, MD;  [facility] OR;  Service: Open Heart Surgery;  Laterality: N/A;  possible BIMA   ENDOVEIN HARVEST OF GREATER SAPHENOUS VEIN Right 10/15/2020   Procedure: ENDOVEIN HARVEST OF GREATER SAPHENOUS VEIN;  Surgeon: Linden Dolin, MD;  [facility] OR;  Service: Open Heart Surgery;  Laterality: Right;   EYE SURGERY     heart stents  01/2004   INGUINAL HERNIA REPAIR Right 12/06/2012   Procedure: HERNIA REPAIR INGUINAL ADULT;  Surgeon: Lodema Pilot, DO;  [facility]S;  Service: General;  Laterality: Right;   INSERTION OF MESH Right 12/06/2012   Procedure: INSERTION OF MESH;  Surgeon: Lodema Pilot, DO;  Location: [facility] ORS;  Service: General;  Laterality: Right;   LEFT HEART CATH AND CORONARY ANGIOGRAPHY N/A 07/22/2019   Procedure: LEFT HEART CATH AND CORONARY ANGIOGRAPHY;  Surgeon: Lennette Bihari, MD;  [facility] INVAS[facility] CV LAB;  Service: Cardiovascular;  Laterality: N/A;   LEFT HEART CATH AND CORONARY  ANGIOGRAPHY N/A 10/14/2020   Procedure: LEFT HEART CATH AND CORONARY ANGIOGRAPHY;  Surgeon: Lennette Bihari, MD;  [facility] INV[facility] CV LAB;  Service: Cardiovascular;  Laterality: N/A;   TEE WITHOUT CARDIOVERSION N/A 10/15/2020   Procedure: TRANSESOPHAGEAL ECHOCARDIOGRAM (TEE);  Surgeon: Linden Dolin, MD;  Location: [facility] OR;  Service: Open Heart Surgery;  Laterality: N/A;   Social History   Socioeconomic History   Marital status: Married    Spouse name: Not on file   Number of children: Not on file   Years of education: Not on file   Highest education level: Not on file  Occupational  History   Not on file  Tobacco Use   Smoking status: Former    Current packs/day: 0.00    Average packs/day: 1 pack/day for 53.0 years (53.0 ttl pk-yrs)    Types: Cigarettes    Start date: 10/15/1967    Quit date: 10/14/2020    Years since quitting: 2.5   Smokeless tobacco: Never  Vaping Use   Vaping status: Never Used  Substance and Sexual Activity   Alcohol use: Yes    Alcohol/week: 5.0 standard drinks of alcohol    Types: 5 Standard drinks or equivalent per week    Comment: consumes 1 pint per day    Drug use: No   Sexual activity: Yes  Other Topics Concern   Not on file  Social History Narrative   Retired   Lives in [place]      Wishes to be DNR   Social Determinants of Corporate investment banker Strain: Not on file  Food Insecurity: No Food Insecurity (10/29/2022)   Hunger Vital Sign    Worried About Running Out of Food in the Last Year: Never true    Ran Out of Food in the Last Year: Never true  Transportation Needs: No Transportation Needs (10/29/2022)   PRAPARE - Administrator, Civil Service (Medical): No    Lack of Transportation (Non-Medical): No  Physical Activity: Not on file  Stress: Not on file  Social Connections: Not on file   Family History  Problem Relation Age of Onset   CAD Father        CABG with subsequent stroke   Allergies  Allergen Reactions   Niaspan [Niacin Er] Itching and Other (See Comments)    insomnia   Statins     LFT elevavation, CK elevevation   Prior to Admission medications   Medication Sig Start Date End Date Taking? Authorizing Provider  acetaminophen (TYLENOL) 500 MG tablet Take 1,000 mg by mouth every 6 (six) hours as needed for moderate pain.   Yes [provider]  aspirin 81 MG chewable tablet Chew 1 tablet (81 mg total) by mouth daily. 11/04/20  Yes Love, Evlyn Kanner, PA-C  Evolocumab (REPATHA SURECLICK) 140 MG/ML SOAJ INJECT 140 MG INTO THE SKIN EVERY 14 DAYS 04/10/23  Yes Lennette Bihari, MD   Fluticasone-Umeclidin-Vilant (TRELEGY ELLIPTA) 100-62.5-25 MCG/ACT AEPB Inhale 1 puff into the lungs daily. 03/02/23  Yes Martina Sinner, MD  ipratropium-albuterol (DUONEB) 0.5-2.5 (3) MG/3ML SOLN Take 3 mLs by nebulization every 6 (six) hours as needed. 12/12/22  Yes Martina Sinner, MD  metoprolol succinate (TOPROL-XL) 25 MG 24 hr tablet Take 1 tablet (25 mg total) by mouth daily. 11/08/22  Yes Noralee Stain, DO  sodium chloride (OCEAN) 0.65 % SOLN nasal spray Place 1 spray into both nostrils 2 (two) times daily at 8 am and 10 pm.  11/04/20  Yes Love, Evlyn Kanner, PA-C  albuterol (VENTOLIN HFA) 108 (90 Base) MCG/ACT inhaler Inhale 1-2 puffs into the lungs every 6 (six) hours as needed for wheezing or shortness of breath. 11/07/22   Noralee Stain, DO  clopidogrel (PLAVIX) 75 MG tablet Take 1 tablet (75 mg total) by mouth daily. 05/16/23   Alver Sorrow, NP  furosemide (LASIX) 80 MG tablet Take 80 mg by mouth daily as needed for edema.    [provider]     Positive ROS: All other systems have been reviewed and were otherwise negative with the exception of those mentioned in the HPI and as above.  Physical Exam:  Vitals:   05/18/23 0811  BP: 135/74  Pulse: (!) 56  Resp: (!) 21  Temp: 98.1 F (36.7 C)  SpO2: 98%   General: Alert, no acute distress Cardiovascular: No pedal edema Respiratory: No cyanosis, no use of accessory musculature GI: No organomegaly, abdomen is soft and non-tender Skin: No lesions in the area of chief complaint Neurologic: Sensation intact distally Psychiatric: Patient is competent for consent with normal mood and affect Lymphatic: No axillary or cervical lymphadenopathy  MUSCULOSKELETAL: Left ankle in short leg splint.  Forefoot exposed is warm and well-perfused.  Some swelling.  No significant tenderness to palpation about the forefoot.  No tenderness proximal to the splint.  Assessment: Left bimalleolar ankle fracture with possible syndesmosis  disruption   Plan: Plan for open treatment of his bimalleolar ankle fracture with possible syndesmosis fixation.  He he will be discharged from the PACU postoperatively.  We discussed the risks, benefits and alternatives of surgery which include but are not limited to wound healing complications, infection, nonunion, malunion, need for further surgery, damage to surrounding structures and continued pain.  They understand there is no guarantees to an acceptable outcome.  After weighing these risks they opted to proceed with surgery.     Terance Hart, MD    05/18/2023 9:48 AM

## 2023-05-18 NOTE — Anesthesia Procedure Notes (Signed)
Anesthesia Regional Block: Adductor canal block   Pre-Anesthetic Checklist: , timeout performed,  Correct Patient, Correct Site, Correct Laterality,  Correct Procedure, Correct Position, site marked,  Risks and benefits discussed,  Surgical consent,  Pre-op evaluation,  At surgeon's request and post-op pain management  Laterality: Left  Prep: chloraprep       Needles:  Injection technique: Single-shot  Needle Type: Echogenic Stimulator Needle     Needle Length: 10cm  Needle Gauge: 21   Needle insertion depth: 7 cm   Additional Needles:   Procedures:,,,, ultrasound used (permanent image in chart),,    Narrative:  Start time: 05/18/2023 10:08 AM End time: 05/18/2023 10:13 AM Injection made incrementally with aspirations every 5 mL.  Performed by: Personally  Anesthesiologist: Mal Amabile, MD  Additional Notes: Timeout performed. Patient sedated. Relevant anatomy ID'd using [place]. Incremental 2-41ml injection of LA with frequent aspiration. Patient tolerated procedure well.

## 2023-05-18 NOTE — Transfer of Care (Signed)
Immediate Anesthesia Transfer of Care Note  Patient: Jonathan [name]  Procedure(s) Performed: OPEN TREATMENT OF LEFT BIMALLEOLAR ANKLE FRACTUER AND SYNDESMOSIS (Left: Ankle)  Patient Location: PACU  Anesthesia Type:General and Regional  Level of Consciousness: awake, alert , and oriented  Airway & Oxygen Therapy: Patient Spontanous Breathing and Patient connected to face mask oxygen  Post-op Assessment: Report given to RN and Post -op Vital signs reviewed and stable  Post vital signs: Reviewed and stable  Last Vitals:  Vitals Value Taken Time  BP 149/68 05/18/23 1245  Temp    Pulse 65 05/18/23 1247  Resp 13 05/18/23 1247  SpO2 100 % 05/18/23 1247  Vitals shown include unfiled device data.  Last Pain:  Vitals:   05/18/23 1025  TempSrc:   PainSc: 0-No pain         Complications: No notable events documented.

## 2023-05-18 NOTE — Anesthesia Postprocedure Evaluation (Signed)
Anesthesia Post Note  Patient: Jonathan [name]  Procedure(s) Performed: OPEN TREATMENT OF LEFT BIMALLEOLAR ANKLE FRACTUER AND SYNDESMOSIS (Left: Ankle)     Patient location during evaluation: PACU Anesthesia Type: General Level of consciousness: awake and alert and oriented Pain management: pain level controlled Vital Signs Assessment: post-procedure vital signs reviewed and stable Respiratory status: spontaneous breathing, nonlabored ventilation and respiratory function stable Cardiovascular status: blood pressure returned to baseline and stable Postop Assessment: no apparent nausea or vomiting Anesthetic complications: no   No notable events documented.  Last Vitals:  Vitals:   05/18/23 1300 05/18/23 1315  BP: 138/73 126/71  Pulse: 64 61  Resp: 16 15  Temp:    SpO2: 91% 92%    Last Pain:  Vitals:   05/18/23 1315  TempSrc:   PainSc: 0-No pain                 Barrie Wale A.

## 2023-05-18 NOTE — Anesthesia Procedure Notes (Signed)
Anesthesia Regional Block: Popliteal block   Pre-Anesthetic Checklist: , timeout performed,  Correct Patient, Correct Site, Correct Laterality,  Correct Procedure, Correct Position, site marked,  Risks and benefits discussed,  Surgical consent,  Pre-op evaluation,  At surgeon's request and post-op pain management  Laterality: Left  Prep: chloraprep       Needles:  Injection technique: Single-shot  Needle Type: Echogenic Stimulator Needle     Needle Length: 10cm  Needle Gauge: 21   Needle insertion depth: 6 cm   Additional Needles:   Procedures:,,,, ultrasound used (permanent image in chart),,    Narrative:  Start time: 05/18/2023 10:03 AM End time: 05/18/2023 10:08 AM Injection made incrementally with aspirations every 5 mL.  Performed by: Personally  Anesthesiologist: Mal Amabile, MD  Additional Notes: Timeout performed. Patient sedated. Relevant anatomy ID'd using [place]. Incremental 2-72ml injection of LA with frequent aspiration. Patient tolerated procedure well.

## 2023-05-18 NOTE — Discharge Instructions (Signed)
DR. Lucia Gaskins FOOT & ANKLE SURGERY POST-OP INSTRUCTIONS   Pain Management The numbing medicine and your leg will last around 18 hours, take a dose of your pain medicine as soon as you feel it wearing off to avoid rebound pain. Keep your foot elevated above heart level.  Make sure that your heel hangs free ('floats'). Take all prescribed medication as directed. If taking narcotic pain medication you may want to use an over-the-counter stool softener to avoid constipation. You may take over-the-counter NSAIDs (ibuprofen, naproxen, etc.) as well as over-the-counter acetaminophen as directed on the packaging as a supplement for your pain and may also use it to wean away from the prescription medication.  Activity Non-weightbearing Keep splint intact  First Postoperative Visit Your first postop visit will be at least 2 weeks after surgery.  This should be scheduled when you schedule surgery. If you do not have a postoperative visit scheduled please call [phone] to schedule an appointment. At the appointment your incision will be evaluated for suture removal, x-rays will be obtained if necessary.  General Instructions Swelling is very common after foot and ankle surgery.  It often takes 3 months for the foot and ankle to begin to feel comfortable.  Some amount of swelling will persist for 6-12 months. DO NOT change the dressing.  If there is a problem with the dressing (too tight, loose, gets wet, etc.) please contact Dr. Pollie Friar office. DO NOT get the dressing wet.  For showers you can use an over-the-counter cast cover or wrap a washcloth around the top of your dressing and then cover it with a plastic bag and tape it to your leg. DO NOT soak the incision (no tubs, pools, bath, etc.) until you have approval from Dr. Lucia Gaskins.  Contact Dr. Huel Cote office or go to Emergency Room if: Temperature above 101 F. Increasing pain that is unresponsive to pain medication or elevation Excessive redness or  swelling in your foot Dressing problems - excessive bloody drainage, looseness or tightness, or if dressing gets wet Develop pain, swelling, warmth, or discoloration of your calf

## 2023-05-18 NOTE — Anesthesia Procedure Notes (Signed)
Procedure Name: LMA Insertion Date/Time: 05/18/2023 11:15 AM  Performed by: Shelbi Vaccaro D, CRNAPre-anesthesia Checklist: Patient identified, Emergency Drugs available, Suction available and Patient being monitored Patient Re-evaluated:Patient Re-evaluated prior to induction Oxygen Delivery Method: Circle system utilized Preoxygenation: Pre-oxygenation with 100% oxygen Induction Type: IV induction Ventilation: Mask ventilation without difficulty LMA: LMA inserted LMA Size: 4.0 Tube type: Oral Number of attempts: 1 Placement Confirmation: positive ETCO2 and breath sounds checked- equal and bilateral Tube secured with: Tape Dental Injury: Teeth and Oropharynx as per pre-operative assessment

## 2023-05-19 ENCOUNTER — Encounter (HOSPITAL_COMMUNITY): Payer: Self-pay | Admitting: Orthopaedic Surgery

## 2023-05-19 NOTE — Op Note (Signed)
Jonathan [name] male 72 y.o. 05/18/2023  PreOperative Diagnosis: Left bimalleolar ankle fracture Left syndesmosis disruption  PostOperative Diagnosis: Left bimalleolar ankle fracture Left syndesmosis disruption  PROCEDURE: Open treatment left bimalleolar ankle fracture Right ankle arthrotomy and exploration Open treatment syndesmosis Stress view fluoroscopy  SURGEON: Dub Mikes, MD  ASSISTANT: none  ANESTHESIA: General with peripheral nerve block  FINDINGS: Displaced bimalleolar ankle fracture and syndesmosis disruption  IMPLANTS: Arthrex distal fibular locking plate, 3.5 mm fully threaded screws  INDICATIONS:71 y.o. malesustained the above injury after a fall.  He was seen to have displacement of his lateral malleolus fracture and posterior malleolus fracture consistent with bimalleolar ankle fracture.  There is concern for syndesmosis disruption.  Patient had a bit of a delay in presentation and required cardiology clearance and therefore was several weeks out from his injury at the time of surgery.   Patient understood the risks, benefits and alternatives to surgery which include but are not limited to wound healing complications, infection, nonunion, malunion, need for further surgery as well as damage to surrounding structures. They also understood the potential for continued pain in that there were no guarantees of acceptable outcome After weighing these risks the patient opted to proceed with surgery.  PROCEDURE: Patient was identified in the preoperative holding area.  The left ankle was marked by myself.  Consent was signed by myself and the patient.  Block was performed by anesthesia in the preoperative holding area.  Patient was taken to the operative suite and placed supine on the operative table.  General LMA anesthesia was induced without difficulty. Bump was placed under the operative hip and bone foam was used.  All bony prominences were well padded.   Tourniquet was placed on the operative thigh.  Preoperative antibiotics were given. The extremity was prepped and draped in the usual sterile fashion and surgical timeout was performed.  The limb was elevated and the tourniquet was inflated to 250 mmHg.  We began by making a longitudinal incision overlying the distal fibula.  This was taken sharply down through skin and subcutaneous tissue.  Blunt dissection was used to identify any branch of the superficial peroneal nerve was not identified in the surgical field.  The incision was then taken sharply down to bone and the fracture site was identified.  The fracture site was mobilized.   The fracture site were cleaned with a rondure and curette of any fracture hematoma and callus formation. Attempt was made to reduce the ankle joint and fibula fracture however due to the chronicity of the fracture there was presumed interposed tissue within the medial gutter.  Patient did not have a medial malleolus fracture but decision was made to perform an arthrotomy for exploratory purposes.  We turned attention to the medial ankle.  A separate incision was made overlying the medial malleolus and anteromedial ankle joint.  This was carried sharply through skin is amputated tissue.  Blunt dissection was used to mobilize skin flaps.  The anterior medial ankle joint capsule was identified and a capsulotomy was performed.  Were able to.  To the joint.  There is a significant amount of fibrous hematoma within the medial gutter precluding ankle joint reduction.  We have further and explored the ankle joint and there was some cartilage wear on the anterior aspect of the talus as well as along the anteromedial talar dome.  Using sharp dissection with a 15 blade as well as Ron [place] curet the fibrous tissue was removed from the medial gutter  to allow for acceptable reduction of the tibiotalar joint.  The material was discarded.  Further inspection of the joint yielded no further  material within the joint there was precluding reduction.  We then turned our attention back to the fibula.  Then the fracture of the fibula was reduced under direct visualization and held provisionally with a lobster claw.  Then fluoroscopy confirmed adequate reduction of the ankle mortise at that time.  Then a combination of locking and nonlocking screws were used..  This provided good stability of the distal fibula fracture.  Fluoroscopy was used to confirm position of the fracture.  The posterior malleolus fracture was well reduced and not amenable to internal fixation.  Then under fluoroscopy the ankle was stressed and found to be unstable with regard to medial clear space widening or syndesmotic widening. We also were able to stress the syndesmosis directly and it was unstable.  We proceeded with open treatment of the syndesmosis.  Several deep incision was created anteriorly about the fibula at the level of the incisura and the syndesmosis was reduced directly.  Then a Weber clamp was used to stabilize the joint with the ankle in maximal dorsiflexion.  Then fully threaded screw was placed across the syndesmosis through the fibular plate to stabilize the syndesmosis.  After placement of the screw the syndesmosis was stable.  Fluoroscopic images were obtained.  The wounds were irrigated and the subcuticular tissue was closed with 3-0 Monocryl and the skin with staples.  Xeroform was placed on the wounds as well as 4 x 4's and sterile sheet cotton.  The tourniquet was released.    Patient was placed in a nonweightbearing short leg splint.  Patient tolerated the procedure well.  There were no complications.  Patient was awakened from anesthesia and taken recovery in stable condition.  POST OPERATIVE INSTRUCTIONS: Nonweightbearing on operative extremity Keep splint dry and limb elevated Restart Plavix for DVT prophylaxis. Call the office with concerns Follow-up in 2 weeks for splint removal,  x-rays of the operative ankle, nonweightbearing and suture removal if appropriate.  He will be placed in a short leg cast   TOURNIQUET TIME:less than 2 hours  BLOOD LOSS:  Minimal         DRAINS: none         SPECIMEN: none       COMPLICATIONS:  * No complications entered in OR log *         Disposition: PACU - hemodynamically stable.         Condition: stable

## 2023-06-24 ENCOUNTER — Other Ambulatory Visit: Payer: Self-pay | Admitting: Cardiovascular Disease

## 2023-07-28 ENCOUNTER — Ambulatory Visit: Payer: Medicare Other | Admitting: Cardiovascular Disease

## 2023-08-15 ENCOUNTER — Telehealth: Payer: Self-pay | Admitting: Pulmonary Disease

## 2023-08-15 NOTE — Telephone Encounter (Signed)
Pt computer has broke and will like to be reach via phone instaed of mychart visit FYI [phone].

## 2023-08-18 ENCOUNTER — Telehealth: Payer: Medicare Other | Admitting: Pulmonary Disease

## 2023-08-18 ENCOUNTER — Encounter: Payer: Self-pay | Admitting: Pulmonary Disease

## 2023-08-18 DIAGNOSIS — J449 Chronic obstructive pulmonary disease, unspecified: Secondary | ICD-10-CM | POA: Diagnosis not present

## 2023-08-18 DIAGNOSIS — F17210 Nicotine dependence, cigarettes, uncomplicated: Secondary | ICD-10-CM | POA: Diagnosis not present

## 2023-08-18 MED ORDER — TRELEGY ELLIPTA 100-62.5-25 MCG/ACT IN AEPB: 1.0000 | INHALATION_SPRAY | Freq: Every day | RESPIRATORY_TRACT | 5 refills | Status: AC

## 2023-08-18 NOTE — Progress Notes (Signed)
Virtual Visit via Video Note  I connected with Jonathan [name] on 08/18/23 at  2:00 PM EST by a video enabled telemedicine application and verified that I am speaking with the correct person using two identifiers.  Location: Patient: home Provider: clinic   I discussed the limitations of evaluation and management by telemedicine and the availability of in person appointments. The patient expressed understanding and agreed to proceed.  History of Present Illness: Jonathan [name] is a 72 year old male, daily smoker with CAD s/p CABG, chronic respiratory failure and COPD who returns to pulmonary clinic for evaluation of COPD.    He was last seen in clinic 12/21/22 and started on trelegy ellitpa 1 puff daily and PRN nebulizer treatments.  He has not been using his supplemental oxygen with his O2 levels maintaining 92-96%. He is requesting to cancel his oxygen orders.   Observations/Objective: No acute distress Talking in full sentences No cough  Assessment and Plan: COPD Cigarette Smoker  Plan: - continue trelegy ellipta 1 puff daily - use duonebs as needed - we will send an order to his DME to cancel his supplemental oxygen - Recommend smoking cessation but he is not interested in quitting at this time - He is not interested in lung cancer screening   Follow Up Instructions: Follow up in 1 year, call sooner if needed   I discussed the assessment and treatment plan with the patient. The patient was provided an opportunity to ask questions and all were answered. The patient agreed with the plan and demonstrated an understanding of the instructions.   The patient was advised to call back or seek an in-person evaluation if the symptoms worsen or if the condition fails to improve as anticipated.  I provided 30 minutes of non-face-to-face time during this encounter.   Martina Sinner, MD

## 2023-08-18 NOTE — Patient Instructions (Signed)
Continue trelegy ellipta 1 puff daily  Use primatine mist as needed  Use duoneb nebulizer as needed every 4-6 hours   We will send an order to your oxygen company to discontinue your oxygen  Recommend quitting smoking using nicotine replacement with patches and nicotine lozenges  Follow up in 1 year, call sooner if needed

## 2023-09-08 ENCOUNTER — Telehealth: Payer: Self-pay | Admitting: Pulmonary Disease

## 2023-09-08 DIAGNOSIS — J449 Chronic obstructive pulmonary disease, unspecified: Secondary | ICD-10-CM

## 2023-09-08 NOTE — Telephone Encounter (Signed)
Pt calling in bc he will like for his POC picked up by [facility]   Fax: [phone]

## 2023-09-15 NOTE — Telephone Encounter (Signed)
 Order was placed to d/c O2 as listed in LOV notes with [facility]. NFN

## 2024-01-23 ENCOUNTER — Telehealth: Payer: Self-pay | Admitting: Cardiovascular Disease

## 2024-01-23 DIAGNOSIS — Z79899 Other long term (current) drug therapy: Secondary | ICD-10-CM

## 2024-01-23 DIAGNOSIS — E785 Hyperlipidemia, unspecified: Secondary | ICD-10-CM

## 2024-01-23 NOTE — Telephone Encounter (Signed)
 Patient wants to get orders for lab work to check if his Repatha  is working.  Patient stated he will have the lab work done at [facility].

## 2024-01-23 NOTE — Telephone Encounter (Signed)
 Pt would like lipid panel drawn. He gets labs drawn at [facility]- So he will need the lab slips mailed to him so he can get them drawn at that lab.

## 2024-02-06 NOTE — Telephone Encounter (Signed)
 Okay to order fasting lipid panel and also check LP(a)

## 2024-02-07 ENCOUNTER — Other Ambulatory Visit (HOSPITAL_BASED_OUTPATIENT_CLINIC_OR_DEPARTMENT_OTHER): Payer: Self-pay | Admitting: Family

## 2024-02-07 NOTE — Telephone Encounter (Signed)
 Returned call to patient, confirmed mailing address for labs.  Lipid panel and LP(a) ordered and mailed to patient as requested.  Patient expressed appreciation for assistance.

## 2024-02-07 NOTE — Telephone Encounter (Signed)
 Left message for patient to call back

## 2024-02-07 NOTE — Telephone Encounter (Signed)
 Patient returned RN's call.

## 2024-02-23 ENCOUNTER — Other Ambulatory Visit: Payer: Self-pay | Admitting: Cardiovascular Disease

## 2024-02-23 DIAGNOSIS — E7849 Other hyperlipidemia: Secondary | ICD-10-CM

## 2024-02-23 DIAGNOSIS — I214 Non-ST elevation (NSTEMI) myocardial infarction: Secondary | ICD-10-CM

## 2024-03-04 ENCOUNTER — Other Ambulatory Visit: Payer: Self-pay | Admitting: Pulmonary Disease

## 2024-03-04 NOTE — Telephone Encounter (Signed)
 Copied from CRM [id number]. Topic: Clinical - Medication Refill >> Mar 04, 2024 12:09 PM Nathanel DEL wrote: Medication: ipratropium-albuterol  (DUONEB) 0.5-2.5 (3) MG/3ML SOLN  Has the patient contacted their pharmacy? Yes Pt states his pharmacy requested last week, however I am not seeing a request.  This is the patient's preferred pharmacy:  [facility] - [place], [place] - [address] [place] [address] Phone: [phone] Fax: [phone]   Is this the correct pharmacy for this prescription? Yes If no, delete pharmacy and type the correct one.   Has the prescription been filled recently? Yes  Is the patient out of the medication? Yes, almost Pt states he has only 2 tubes left and would like asap please. Has the patient been seen for an appointment in the last year OR does the patient have an upcoming appointment? Yes  Can we respond through MyChart? No

## 2024-03-05 MED ORDER — IPRATROPIUM-ALBUTEROL 0.5-2.5 (3) MG/3ML IN SOLN: 3.0000 mL | Freq: Four times a day (QID) | RESPIRATORY_TRACT | 1 refills | Status: DC | PRN

## 2024-03-22 NOTE — Telephone Encounter (Addendum)
 Left detailed message on pt's voicemail (ok per DPR) regarding recent lab work. Per Dr. Burnard, labs look great, no changes in medication at this time. Call back number left for pt if needed.   Labs scanned in under media.

## 2024-04-11 ENCOUNTER — Ambulatory Visit: Payer: Self-pay

## 2024-06-23 ENCOUNTER — Other Ambulatory Visit (HOSPITAL_BASED_OUTPATIENT_CLINIC_OR_DEPARTMENT_OTHER): Payer: Self-pay | Admitting: Family

## 2024-06-24 ENCOUNTER — Other Ambulatory Visit: Payer: Self-pay

## 2024-07-10 ENCOUNTER — Other Ambulatory Visit: Payer: Self-pay | Admitting: General Practice

## 2024-07-11 MED ORDER — METOPROLOL SUCCINATE ER 25 MG PO TB24: 37.5000 mg | ORAL_TABLET | Freq: Every day | ORAL | 0 refills | Status: AC

## 2024-07-29 ENCOUNTER — Other Ambulatory Visit: Payer: Self-pay

## 2024-07-31 MED ORDER — CLOPIDOGREL BISULFATE 75 MG PO TABS: 75.0000 mg | ORAL_TABLET | Freq: Every day | ORAL | 0 refills | Status: DC

## 2024-08-05 ENCOUNTER — Encounter: Payer: Self-pay | Admitting: *Deleted

## 2024-08-05 ENCOUNTER — Telehealth: Payer: Self-pay | Admitting: Internal Medicine

## 2024-08-05 NOTE — Telephone Encounter (Signed)
*  STAT* If patient is at the pharmacy, call can be transferred to refill team.   1. Which medications need to be refilled? (please list name of each medication and dose if known) clopidogrel  (PLAVIX ) 75 MG tablet    2. Would you like to learn more about the convenience, safety, & potential cost savings by using the [facility]?   3. Are you open to using the [facility] (Type [facility]. ).   4. Which pharmacy/location (including street and city if local pharmacy) is medication to be sent to? [facility] - [place], [place] - [address] [place]   5. Do they need a 30 day or 90 day supply? 90day Patient is scheduled for 08/06/24

## 2024-08-05 NOTE — Telephone Encounter (Signed)
 Returned call to pt.  No answer.  Pt scheduled tomorrow, 08/06/24.  Was going to let pt know that they will send in his 90 day refill at his appointment.  Will forward to Reena Lunger, RN, to make sure pt's refill is taken care of.

## 2024-08-06 ENCOUNTER — Encounter: Payer: Self-pay | Admitting: Internal Medicine

## 2024-08-06 ENCOUNTER — Ambulatory Visit: Attending: Internal Medicine | Admitting: Internal Medicine

## 2024-08-06 VITALS — BP 124/60 | HR 50 | Ht 68.0 in | Wt 195.1 lb

## 2024-08-06 DIAGNOSIS — Z720 Tobacco use: Secondary | ICD-10-CM

## 2024-08-06 DIAGNOSIS — I2510 Atherosclerotic heart disease of native coronary artery without angina pectoris: Secondary | ICD-10-CM | POA: Diagnosis not present

## 2024-08-06 DIAGNOSIS — Z716 Tobacco abuse counseling: Secondary | ICD-10-CM

## 2024-08-06 DIAGNOSIS — Z789 Other specified health status: Secondary | ICD-10-CM

## 2024-08-06 DIAGNOSIS — Z951 Presence of aortocoronary bypass graft: Secondary | ICD-10-CM

## 2024-08-06 NOTE — Patient Instructions (Signed)
 Medication Instructions:   No changes  *If you need a refill on your cardiac medications before your next appointment, please call your pharmacy*   Lab Work: Not needed    Testing/Procedures:  Not needed  Follow-Up: At [facility], you and your health needs are our priority.  As part of our continuing mission to provide you with exceptional heart care, we have created designated Provider Care Teams.  These Care Teams include your primary Cardiologist (physician) and Advanced Practice Providers (APPs -  Physician Assistants and Nurse Practitioners) who all work together to provide you with the care you need, when you need it.     Your next appointment:   12 month(s)  The format for your next appointment:   In Person  Provider:   Emeline FORBES Calender, DO

## 2024-08-06 NOTE — Progress Notes (Signed)
 Cardiology Office Note   Date:  08/06/2024  ID:  Jonathan [name], DOB 09/24/1950, MRN [id number] PCP: Loring Tanda Mae, MD  [facility] Providers Cardiologist:  Emeline FORBES Calender, DO     History of Present Illness Jonathan [name] is a 73 y.o. male who is a prior Dr. 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  Today he states he has been doing well without any complaints.  Does not have any chest pain, shortness of breath or any issues.  He is able to do yard work.  He is not taking any as needed nitroglycerin .  Has no concerns today.  He smokes about 5 cigarettes/day for nearly his whole life and does not have any plans on quitting.    ROS:  Review of Systems  All other systems reviewed and are negative.   Physical Exam  Physical Exam Vitals and nursing note reviewed.  Constitutional:      Appearance: Normal appearance.  HENT:     Head: Normocephalic and atraumatic.  Eyes:     Conjunctiva/sclera: Conjunctivae normal.  Neck:     Vascular: No carotid bruit.  Cardiovascular:     Rate and Rhythm: Normal rate and regular rhythm.  Pulmonary:     Effort: Pulmonary effort is normal.     Breath sounds: Normal breath sounds.  Musculoskeletal:        General: No swelling or tenderness.  Skin:    Coloration: Skin is not jaundiced or pale.  Neurological:     Mental Status: He is alert.     VS:  BP 124/60 (BP Location: Right Arm, Patient Position: Sitting, Cuff Size: Normal)   Pulse (!) 50   Ht 5' 8 (1.727 m)   Wt 195 lb 1.6 oz (88.5 kg)   BMI 29.66 kg/m         Wt Readings from Last 3 Encounters:  08/06/24 195 lb 1.6  oz (88.5 kg)  05/18/23 196 lb (88.9 kg)  05/16/23 196 lb (88.9 kg)     EKG Interpretation Date/Time:  Tuesday August 06 2024 13:45:44 EST Ventricular Rate:  50 PR Interval:  152 QRS Duration:  100 QT Interval:  486 QTC Calculation: 443 R Axis:   87  Text Interpretation: Sinus bradycardia When compared with ECG of 16-May-2023 16:01, No significant change was found Confirmed by Calender Emeline [phone]) on 08/06/2024 1:46:56 PM    Studies Reviewed        Risk Assessment/Calculations             ASCVD risk score: The ASCVD Risk score (Arnett DK, et al., 2019) failed to calculate for the following reasons:   Risk score cannot be calculated because patient has a medical history suggesting prior/existing ASCVD   ASSESSMENT  CAD and 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  On dual antiplatelet therapy (aspirin , Plavix ) Repatha , metoprolol  succinate 37.5 mg daily Post CABG CVA Dyslipidemia cholesterol panel 02/29/2024: Total cholesterol 99, triglycerides 107,  HDL 46, LDL 33 (LDL at goal less than 55), LP(a) 38 Statin intolerance on Repatha  COPD Tobacco use smoking cessation advised.  Patient does not wish to quit.   Plan  Continue current medications Advised tobacco cessation.  Follow up: Follow-up 1 year          Signed, Emeline FORBES Calender, DO

## 2024-08-07 ENCOUNTER — Other Ambulatory Visit: Payer: Self-pay | Admitting: Family

## 2024-08-12 ENCOUNTER — Other Ambulatory Visit: Payer: Self-pay | Admitting: Family

## 2024-08-13 MED ORDER — CLOPIDOGREL BISULFATE 75 MG PO TABS: 75.0000 mg | ORAL_TABLET | Freq: Every day | ORAL | 3 refills | Status: AC

## 2024-08-27 ENCOUNTER — Telehealth: Payer: Self-pay | Admitting: Internal Medicine

## 2024-08-27 NOTE — Telephone Encounter (Signed)
 Will await the preop clearance request to be received.

## 2024-08-27 NOTE — Telephone Encounter (Signed)
 Paper Work Dropped Off:  medical clearance for dental extractions  Date:  08-27-24  Location of paper:  paperwork put in Leanne's mailbox to be scanned to onbase.

## 2024-08-29 NOTE — Telephone Encounter (Signed)
° °  Pre-operative Risk Assessment    Patient Name: Jonathan [name]  DOB: August 15, 1951 MRN: [id number]   Date of last office visit: 08/06/24 DR. SEGAL  Date of next office visit: NONE  Request for Surgical Clearance    Procedure:  Dental Extraction - Amount of Teeth to be Pulled:  3 TEETH SURGICALLY EXTRACTED  Date of Surgery:  Clearance TBD                                Surgeon:  DR. LAURAINE SKEANS, DMD Surgeon's Group or Practice Name:  [facility] Phone number:  [phone] Fax number:  [phone]   Type of Clearance Requested:   - Medical  - Pharmacy:  Hold Aspirin  and Clopidogrel  (Plavix )     Type of Anesthesia:  MAY USE LIDOCAINE , SEPTACAINE OR MEPIVACAINE    Additional requests/questions:    Signed, Joliet Mallozzi   08/29/2024, 4:27 PM

## 2024-08-30 NOTE — Telephone Encounter (Signed)
 Patient was seen by myself on 11/25. He was noted to have stable CAD s/p CABG as was able to do yard work without issues corresponding to > . Therefore he does not require any further preop testing for the anticipated low risk procedure.   Thank you, Emeline FORBES Calender, DO

## 2024-08-30 NOTE — Telephone Encounter (Signed)
" ° °  Patient Name: Jonathan [name]  DOB: 1951/06/01 MRN: [id number]  Primary Cardiologist: Emeline FORBES Calender, DO  Chart reviewed as part of pre-operative protocol coverage. Pre-op clearance already addressed by colleagues in earlier phone notes. To summarize recommendations:  Patient was seen by myself on 11/25. He was noted to have stable CAD s/p CABG as was able to do yard work without issues corresponding to > . Therefore he does not require any further preop testing for the anticipated low risk procedure.  Emeline Calender MD      Hold Plavix  5 days prior to procedure. Resume therapy when medical safe to do so.   Regarding ASA therapy, we recommend continuation of ASA throughout the perioperative period. However, if the surgeon feels that cessation of ASA is required in the perioperative period, it may be stopped 5-7 days prior to surgery with a plan to resume it as soon as felt to be feasible from a surgical standpoint in the post-operative period.   Will route this bundled recommendation to requesting provider via Epic fax function and remove from pre-op pool. Please call with questions.  Mardy KATHEE Pizza, FNP 08/30/2024, 2:18 PM  "

## 2024-09-06 ENCOUNTER — Other Ambulatory Visit: Payer: Self-pay | Admitting: Pulmonary Disease

## 2024-09-13 MED ORDER — IPRATROPIUM-ALBUTEROL 0.5-2.5 (3) MG/3ML IN SOLN: 3.0000 mL | Freq: Four times a day (QID) | RESPIRATORY_TRACT | 1 refills | Status: AC | PRN

## 2024-09-13 NOTE — Telephone Encounter (Signed)
 Copied from CRM [id number]. Topic: Clinical - Prescription Issue >> Sep 13, 2024 11:07 AM Rilla NOVAK wrote: Reason for CRM: Patient states he needs refill for  ipratropium-albuterol  (DUONEB) 0.5-2.5 (3) MG/3ML SOLN. Order was denied and patient states he was told to schedule appt. Patient has scheduled appt for 10/08/2024 (first avail) and states he will need the medication before that as he's out. Patient would like enough to at least get to his appt.      ----------------------------------------------------------------------- From previous Reason for Contact - Medication Refill: Medication:   Has the patient contacted their pharmacy?   (Agent: If no, request that the patient contact the pharmacy for the refill. If patient does not wish to contact the pharmacy document the reason why and proceed with request.) (Agent: If yes, when and what did the pharmacy advise?)  This is the patient's preferred pharmacy:  [facility] - [place], [place] - [address] [place] [address] Phone: [phone] Fax: [phone]  [facility] - [place], [place] - [address] [facility] [address] [place] [address] Phone: [phone] Fax: [phone]  [facility] - [place], [place] - [address] [place] [address] Phone: [phone] Fax: [phone]  Is this the correct pharmacy for this prescription?   If no, delete pharmacy and type the correct one.   Has the prescription been filled recently?    Is the patient out of the medication?    Has the patient been seen for an appointment in the last year OR does the patient have an upcoming appointment?    Can we respond through MyChart?    Agent: Please be advised that Rx refills may take up to 3 business days. We ask that you follow-up with your  pharmacy.   ----------------------------------------------------------------------- From previous Reason for Contact - Scheduling: Patient/patient representative is calling to schedule an appointment. Refer to attachments for appointment information.   Refill sent

## 2024-10-08 ENCOUNTER — Ambulatory Visit: Admitting: Pulmonary Disease

## 2024-12-04 ENCOUNTER — Ambulatory Visit: Admitting: Pulmonary Disease
